# Patient Record
Sex: Female | Born: 1935
Health system: Southern US, Community
[De-identification: ages and names within clinical notes are randomized; demographics above are authoritative.]

## PROBLEM LIST (undated history)

## (undated) DIAGNOSIS — I10 Essential (primary) hypertension: Secondary | ICD-10-CM

## (undated) DIAGNOSIS — J4489 Other specified chronic obstructive pulmonary disease: Secondary | ICD-10-CM

## (undated) DIAGNOSIS — I2699 Other pulmonary embolism without acute cor pulmonale: Secondary | ICD-10-CM

## (undated) DIAGNOSIS — F3289 Other specified depressive episodes: Secondary | ICD-10-CM

## (undated) DIAGNOSIS — K648 Other hemorrhoids: Secondary | ICD-10-CM

## (undated) DIAGNOSIS — I2789 Other specified pulmonary heart diseases: Secondary | ICD-10-CM

## (undated) DIAGNOSIS — R0600 Dyspnea, unspecified: Secondary | ICD-10-CM

## (undated) DIAGNOSIS — E785 Hyperlipidemia, unspecified: Secondary | ICD-10-CM

## (undated) DIAGNOSIS — J45909 Unspecified asthma, uncomplicated: Secondary | ICD-10-CM

## (undated) DIAGNOSIS — M810 Age-related osteoporosis without current pathological fracture: Secondary | ICD-10-CM

## (undated) DIAGNOSIS — J449 Chronic obstructive pulmonary disease, unspecified: Secondary | ICD-10-CM

## (undated) DIAGNOSIS — K863 Pseudocyst of pancreas: Secondary | ICD-10-CM

## (undated) DIAGNOSIS — H9190 Unspecified hearing loss, unspecified ear: Secondary | ICD-10-CM

## (undated) DIAGNOSIS — J309 Allergic rhinitis, unspecified: Secondary | ICD-10-CM

## (undated) DIAGNOSIS — N318 Other neuromuscular dysfunction of bladder: Secondary | ICD-10-CM

## (undated) DIAGNOSIS — K219 Gastro-esophageal reflux disease without esophagitis: Secondary | ICD-10-CM

## (undated) DIAGNOSIS — J310 Chronic rhinitis: Secondary | ICD-10-CM

## (undated) DIAGNOSIS — R42 Dizziness and giddiness: Secondary | ICD-10-CM

## (undated) DIAGNOSIS — F411 Generalized anxiety disorder: Secondary | ICD-10-CM

## (undated) DIAGNOSIS — K222 Esophageal obstruction: Secondary | ICD-10-CM

## (undated) DIAGNOSIS — K5730 Diverticulosis of large intestine without perforation or abscess without bleeding: Secondary | ICD-10-CM

## (undated) DIAGNOSIS — I6529 Occlusion and stenosis of unspecified carotid artery: Secondary | ICD-10-CM

## (undated) DIAGNOSIS — D126 Benign neoplasm of colon, unspecified: Secondary | ICD-10-CM

## (undated) DIAGNOSIS — G43009 Migraine without aura, not intractable, without status migrainosus: Secondary | ICD-10-CM

## (undated) DIAGNOSIS — I2510 Atherosclerotic heart disease of native coronary artery without angina pectoris: Secondary | ICD-10-CM

## (undated) DIAGNOSIS — K862 Cyst of pancreas: Secondary | ICD-10-CM

## (undated) DIAGNOSIS — K5904 Chronic idiopathic constipation: Secondary | ICD-10-CM

## (undated) DIAGNOSIS — F329 Major depressive disorder, single episode, unspecified: Secondary | ICD-10-CM

## (undated) DIAGNOSIS — I639 Cerebral infarction, unspecified: Secondary | ICD-10-CM

## (undated) HISTORY — DX: Other hemorrhoids: K64.8

## (undated) HISTORY — DX: Dizziness and giddiness: R42

## (undated) HISTORY — DX: Cyst of pancreas: K86.2

## (undated) HISTORY — DX: Pseudocyst of pancreas: K86.3

## (undated) HISTORY — DX: Chronic idiopathic constipation: K59.04

## (undated) HISTORY — DX: Essential (primary) hypertension: I10

## (undated) HISTORY — DX: Other specified depressive episodes: F32.89

## (undated) HISTORY — DX: Chronic obstructive pulmonary disease, unspecified: J44.9

## (undated) HISTORY — DX: Other specified chronic obstructive pulmonary disease: J44.89

## (undated) HISTORY — DX: Esophageal obstruction: K22.2

## (undated) HISTORY — DX: Other specified pulmonary heart diseases: I27.89

## (undated) HISTORY — DX: Migraine without aura, not intractable, without status migrainosus: G43.009

## (undated) HISTORY — DX: Gastro-esophageal reflux disease without esophagitis: K21.9

## (undated) HISTORY — DX: Age-related osteoporosis without current pathological fracture: M81.0

## (undated) HISTORY — PX: ROOT CANAL: SHX2363

## (undated) HISTORY — PX: TONSILLECTOMY: SUR1361

## (undated) HISTORY — PX: PANCREATIC CYST DRAINAGE: SHX2156

## (undated) HISTORY — DX: Other neuromuscular dysfunction of bladder: N31.8

## (undated) HISTORY — DX: Allergic rhinitis, unspecified: J30.9

## (undated) HISTORY — DX: Chronic rhinitis: J31.0

## (undated) HISTORY — DX: Occlusion and stenosis of unspecified carotid artery: I65.29

## (undated) HISTORY — PX: OTHER SURGICAL HISTORY: SHX169

## (undated) HISTORY — DX: Other pulmonary embolism without acute cor pulmonale: I26.99

## (undated) HISTORY — DX: Atherosclerotic heart disease of native coronary artery without angina pectoris: I25.10

## (undated) HISTORY — DX: Unspecified hearing loss, unspecified ear: H91.90

## (undated) HISTORY — DX: Hyperlipidemia, unspecified: E78.5

## (undated) HISTORY — DX: Diverticulosis of large intestine without perforation or abscess without bleeding: K57.30

## (undated) HISTORY — DX: Benign neoplasm of colon, unspecified: D12.6

## (undated) HISTORY — DX: Generalized anxiety disorder: F41.1

## (undated) HISTORY — DX: Major depressive disorder, single episode, unspecified: F32.9

---

## 1961-10-08 HISTORY — PX: APPENDECTOMY: SHX54

## 1961-10-08 HISTORY — PX: CHOLECYSTECTOMY: SHX55

## 1963-10-09 HISTORY — PX: ABDOMINAL HYSTERECTOMY: SHX81

## 1995-08-30 ENCOUNTER — Encounter: Payer: Self-pay | Admitting: Gastroenterology

## 1997-07-28 ENCOUNTER — Encounter: Payer: Self-pay | Admitting: Gastroenterology

## 1997-10-08 HISTORY — PX: OTHER SURGICAL HISTORY: SHX169

## 1997-12-17 ENCOUNTER — Ambulatory Visit (HOSPITAL_COMMUNITY): Admission: RE | Admit: 1997-12-17 | Discharge: 1997-12-17 | Payer: Self-pay | Admitting: Infectious Diseases

## 1998-04-07 ENCOUNTER — Encounter: Payer: Self-pay | Admitting: Gastroenterology

## 1999-05-28 ENCOUNTER — Emergency Department (HOSPITAL_COMMUNITY): Admission: EM | Admit: 1999-05-28 | Discharge: 1999-05-28 | Payer: Self-pay | Admitting: Emergency Medicine

## 1999-05-28 ENCOUNTER — Encounter: Payer: Self-pay | Admitting: Emergency Medicine

## 1999-05-30 ENCOUNTER — Ambulatory Visit: Admission: RE | Admit: 1999-05-30 | Discharge: 1999-05-30 | Payer: Self-pay | Admitting: Internal Medicine

## 1999-09-05 ENCOUNTER — Encounter: Payer: Self-pay | Admitting: Internal Medicine

## 1999-09-05 ENCOUNTER — Ambulatory Visit (HOSPITAL_COMMUNITY): Admission: RE | Admit: 1999-09-05 | Discharge: 1999-09-05 | Payer: Self-pay | Admitting: Internal Medicine

## 1999-12-13 ENCOUNTER — Encounter: Payer: Self-pay | Admitting: Gastroenterology

## 2000-04-29 ENCOUNTER — Other Ambulatory Visit: Admission: RE | Admit: 2000-04-29 | Discharge: 2000-04-29 | Payer: Self-pay | Admitting: Obstetrics and Gynecology

## 2000-06-17 ENCOUNTER — Encounter: Payer: Self-pay | Admitting: Gastroenterology

## 2000-06-18 ENCOUNTER — Encounter (INDEPENDENT_AMBULATORY_CARE_PROVIDER_SITE_OTHER): Payer: Self-pay | Admitting: Specialist

## 2000-06-18 ENCOUNTER — Other Ambulatory Visit: Admission: RE | Admit: 2000-06-18 | Discharge: 2000-06-18 | Payer: Self-pay | Admitting: Gastroenterology

## 2000-06-24 ENCOUNTER — Encounter: Payer: Self-pay | Admitting: Gastroenterology

## 2000-06-24 ENCOUNTER — Ambulatory Visit (HOSPITAL_COMMUNITY): Admission: RE | Admit: 2000-06-24 | Discharge: 2000-06-24 | Payer: Self-pay | Admitting: Gastroenterology

## 2001-01-21 ENCOUNTER — Ambulatory Visit (HOSPITAL_COMMUNITY): Admission: RE | Admit: 2001-01-21 | Discharge: 2001-01-21 | Payer: Self-pay | Admitting: Internal Medicine

## 2001-03-31 ENCOUNTER — Emergency Department (HOSPITAL_COMMUNITY): Admission: EM | Admit: 2001-03-31 | Discharge: 2001-03-31 | Payer: Self-pay | Admitting: Internal Medicine

## 2001-04-06 ENCOUNTER — Emergency Department (HOSPITAL_COMMUNITY): Admission: EM | Admit: 2001-04-06 | Discharge: 2001-04-06 | Payer: Self-pay | Admitting: Emergency Medicine

## 2001-11-20 ENCOUNTER — Encounter: Payer: Self-pay | Admitting: Orthopedic Surgery

## 2001-11-20 ENCOUNTER — Encounter: Admission: RE | Admit: 2001-11-20 | Discharge: 2001-11-20 | Payer: Self-pay | Admitting: Orthopedic Surgery

## 2002-11-20 ENCOUNTER — Encounter: Payer: Self-pay | Admitting: Internal Medicine

## 2002-11-20 ENCOUNTER — Encounter: Admission: RE | Admit: 2002-11-20 | Discharge: 2002-11-20 | Payer: Self-pay | Admitting: Internal Medicine

## 2002-12-06 ENCOUNTER — Ambulatory Visit (HOSPITAL_COMMUNITY): Admission: RE | Admit: 2002-12-06 | Discharge: 2002-12-06 | Payer: Self-pay | Admitting: Gastroenterology

## 2002-12-06 ENCOUNTER — Encounter: Payer: Self-pay | Admitting: Gastroenterology

## 2003-01-26 ENCOUNTER — Encounter: Payer: Self-pay | Admitting: Gastroenterology

## 2003-01-26 ENCOUNTER — Ambulatory Visit (HOSPITAL_COMMUNITY): Admission: RE | Admit: 2003-01-26 | Discharge: 2003-01-26 | Payer: Self-pay | Admitting: Gastroenterology

## 2003-03-04 ENCOUNTER — Encounter: Payer: Self-pay | Admitting: Gastroenterology

## 2003-03-22 ENCOUNTER — Encounter: Payer: Self-pay | Admitting: Gastroenterology

## 2003-04-02 ENCOUNTER — Encounter: Payer: Self-pay | Admitting: Orthopedic Surgery

## 2003-04-02 ENCOUNTER — Encounter: Admission: RE | Admit: 2003-04-02 | Discharge: 2003-04-02 | Payer: Self-pay | Admitting: Orthopedic Surgery

## 2003-04-05 ENCOUNTER — Ambulatory Visit (HOSPITAL_BASED_OUTPATIENT_CLINIC_OR_DEPARTMENT_OTHER): Admission: RE | Admit: 2003-04-05 | Discharge: 2003-04-05 | Payer: Self-pay | Admitting: *Deleted

## 2003-05-21 ENCOUNTER — Ambulatory Visit (HOSPITAL_COMMUNITY): Admission: RE | Admit: 2003-05-21 | Discharge: 2003-05-21 | Payer: Self-pay | Admitting: Internal Medicine

## 2003-05-21 ENCOUNTER — Encounter: Payer: Self-pay | Admitting: Internal Medicine

## 2003-06-06 ENCOUNTER — Encounter: Payer: Self-pay | Admitting: Gastroenterology

## 2003-06-06 ENCOUNTER — Ambulatory Visit (HOSPITAL_COMMUNITY): Admission: RE | Admit: 2003-06-06 | Discharge: 2003-06-06 | Payer: Self-pay | Admitting: Gastroenterology

## 2003-07-24 ENCOUNTER — Emergency Department (HOSPITAL_COMMUNITY): Admission: EM | Admit: 2003-07-24 | Discharge: 2003-07-24 | Payer: Self-pay | Admitting: Emergency Medicine

## 2003-07-27 ENCOUNTER — Ambulatory Visit (HOSPITAL_COMMUNITY): Admission: RE | Admit: 2003-07-27 | Discharge: 2003-07-27 | Payer: Self-pay | Admitting: Internal Medicine

## 2003-07-27 ENCOUNTER — Encounter: Payer: Self-pay | Admitting: Internal Medicine

## 2003-08-29 ENCOUNTER — Ambulatory Visit (HOSPITAL_COMMUNITY): Admission: RE | Admit: 2003-08-29 | Discharge: 2003-08-29 | Payer: Self-pay | Admitting: Gastroenterology

## 2003-08-29 ENCOUNTER — Encounter: Payer: Self-pay | Admitting: Gastroenterology

## 2003-12-24 ENCOUNTER — Encounter: Admission: RE | Admit: 2003-12-24 | Discharge: 2003-12-24 | Payer: Self-pay | Admitting: Internal Medicine

## 2004-01-11 ENCOUNTER — Other Ambulatory Visit: Admission: RE | Admit: 2004-01-11 | Discharge: 2004-01-11 | Payer: Self-pay | Admitting: Obstetrics and Gynecology

## 2004-01-13 ENCOUNTER — Encounter: Payer: Self-pay | Admitting: Gastroenterology

## 2004-01-13 ENCOUNTER — Encounter: Payer: Self-pay | Admitting: Internal Medicine

## 2004-01-18 ENCOUNTER — Emergency Department (HOSPITAL_COMMUNITY): Admission: EM | Admit: 2004-01-18 | Discharge: 2004-01-18 | Payer: Self-pay | Admitting: Emergency Medicine

## 2004-01-22 ENCOUNTER — Inpatient Hospital Stay (HOSPITAL_COMMUNITY): Admission: EM | Admit: 2004-01-22 | Discharge: 2004-01-27 | Payer: Self-pay | Admitting: Emergency Medicine

## 2004-03-12 ENCOUNTER — Ambulatory Visit (HOSPITAL_COMMUNITY): Admission: RE | Admit: 2004-03-12 | Discharge: 2004-03-12 | Payer: Self-pay | Admitting: Internal Medicine

## 2004-05-03 ENCOUNTER — Encounter (INDEPENDENT_AMBULATORY_CARE_PROVIDER_SITE_OTHER): Payer: Self-pay | Admitting: *Deleted

## 2004-05-22 ENCOUNTER — Encounter: Admission: RE | Admit: 2004-05-22 | Discharge: 2004-06-22 | Payer: Self-pay | Admitting: Neurology

## 2004-07-04 ENCOUNTER — Emergency Department (HOSPITAL_COMMUNITY): Admission: EM | Admit: 2004-07-04 | Discharge: 2004-07-04 | Payer: Self-pay | Admitting: Emergency Medicine

## 2004-08-15 ENCOUNTER — Ambulatory Visit: Payer: Self-pay | Admitting: Gastroenterology

## 2004-09-19 ENCOUNTER — Ambulatory Visit: Payer: Self-pay | Admitting: Internal Medicine

## 2004-09-19 ENCOUNTER — Ambulatory Visit (HOSPITAL_COMMUNITY): Admission: RE | Admit: 2004-09-19 | Discharge: 2004-09-19 | Payer: Self-pay | Admitting: Internal Medicine

## 2004-10-12 ENCOUNTER — Ambulatory Visit: Payer: Self-pay | Admitting: Internal Medicine

## 2004-10-18 ENCOUNTER — Ambulatory Visit: Payer: Self-pay

## 2005-01-24 ENCOUNTER — Ambulatory Visit: Payer: Self-pay | Admitting: Internal Medicine

## 2005-01-26 ENCOUNTER — Ambulatory Visit: Payer: Self-pay | Admitting: Internal Medicine

## 2005-02-08 ENCOUNTER — Ambulatory Visit: Payer: Self-pay | Admitting: Internal Medicine

## 2005-03-07 ENCOUNTER — Ambulatory Visit: Payer: Self-pay | Admitting: Internal Medicine

## 2005-05-16 ENCOUNTER — Ambulatory Visit: Payer: Self-pay | Admitting: Internal Medicine

## 2005-07-23 ENCOUNTER — Ambulatory Visit: Payer: Self-pay | Admitting: Internal Medicine

## 2005-08-06 ENCOUNTER — Ambulatory Visit: Payer: Self-pay | Admitting: Internal Medicine

## 2005-09-19 ENCOUNTER — Ambulatory Visit: Payer: Self-pay | Admitting: Internal Medicine

## 2005-10-16 ENCOUNTER — Ambulatory Visit: Payer: Self-pay | Admitting: Internal Medicine

## 2005-12-06 ENCOUNTER — Emergency Department (HOSPITAL_COMMUNITY): Admission: EM | Admit: 2005-12-06 | Discharge: 2005-12-06 | Payer: Self-pay | Admitting: Emergency Medicine

## 2006-03-07 ENCOUNTER — Encounter: Admission: RE | Admit: 2006-03-07 | Discharge: 2006-03-07 | Payer: Self-pay | Admitting: Orthopedic Surgery

## 2006-03-11 ENCOUNTER — Ambulatory Visit: Payer: Self-pay | Admitting: Internal Medicine

## 2006-03-19 ENCOUNTER — Encounter: Admission: RE | Admit: 2006-03-19 | Discharge: 2006-03-19 | Payer: Self-pay | Admitting: Internal Medicine

## 2006-04-08 ENCOUNTER — Ambulatory Visit: Payer: Self-pay | Admitting: Internal Medicine

## 2006-04-18 ENCOUNTER — Encounter (INDEPENDENT_AMBULATORY_CARE_PROVIDER_SITE_OTHER): Payer: Self-pay | Admitting: *Deleted

## 2006-04-18 ENCOUNTER — Ambulatory Visit: Payer: Self-pay | Admitting: Gastroenterology

## 2006-05-13 ENCOUNTER — Ambulatory Visit (HOSPITAL_COMMUNITY): Admission: RE | Admit: 2006-05-13 | Discharge: 2006-05-13 | Payer: Self-pay | Admitting: Gastroenterology

## 2006-07-03 ENCOUNTER — Encounter: Admission: RE | Admit: 2006-07-03 | Discharge: 2006-07-03 | Payer: Self-pay | Admitting: Orthopedic Surgery

## 2006-07-23 ENCOUNTER — Ambulatory Visit: Payer: Self-pay | Admitting: Internal Medicine

## 2006-08-15 ENCOUNTER — Ambulatory Visit: Payer: Self-pay | Admitting: Internal Medicine

## 2006-08-23 ENCOUNTER — Ambulatory Visit: Payer: Self-pay | Admitting: Internal Medicine

## 2006-08-27 ENCOUNTER — Ambulatory Visit: Payer: Self-pay | Admitting: Internal Medicine

## 2006-09-06 ENCOUNTER — Encounter: Payer: Self-pay | Admitting: Gastroenterology

## 2006-09-10 ENCOUNTER — Encounter (INDEPENDENT_AMBULATORY_CARE_PROVIDER_SITE_OTHER): Payer: Self-pay | Admitting: *Deleted

## 2006-09-10 ENCOUNTER — Ambulatory Visit (HOSPITAL_COMMUNITY): Admission: RE | Admit: 2006-09-10 | Discharge: 2006-09-10 | Payer: Self-pay | Admitting: Gastroenterology

## 2006-10-08 DIAGNOSIS — I2699 Other pulmonary embolism without acute cor pulmonale: Secondary | ICD-10-CM

## 2006-10-08 HISTORY — DX: Other pulmonary embolism without acute cor pulmonale: I26.99

## 2006-11-04 ENCOUNTER — Ambulatory Visit: Payer: Self-pay | Admitting: Internal Medicine

## 2006-11-20 ENCOUNTER — Ambulatory Visit: Payer: Self-pay

## 2007-01-27 ENCOUNTER — Ambulatory Visit: Payer: Self-pay | Admitting: Internal Medicine

## 2007-01-27 LAB — CONVERTED CEMR LAB
Bacteria, UA: NEGATIVE
Bilirubin Urine: NEGATIVE
Crystals: NEGATIVE
Hemoglobin, Urine: NEGATIVE
Ketones, ur: NEGATIVE mg/dL
Leukocytes, UA: NEGATIVE
Mucus, UA: NEGATIVE
Nitrite: NEGATIVE
RBC / HPF: NONE SEEN
Specific Gravity, Urine: 1.025 (ref 1.000–1.03)
Total Protein, Urine: NEGATIVE mg/dL
Urine Glucose: NEGATIVE mg/dL
Urobilinogen, UA: 0.2 (ref 0.0–1.0)
WBC, UA: NONE SEEN cells/hpf
pH: 6 (ref 5.0–8.0)

## 2007-03-06 ENCOUNTER — Emergency Department (HOSPITAL_COMMUNITY): Admission: EM | Admit: 2007-03-06 | Discharge: 2007-03-06 | Payer: Self-pay | Admitting: Emergency Medicine

## 2007-03-11 DIAGNOSIS — M810 Age-related osteoporosis without current pathological fracture: Secondary | ICD-10-CM | POA: Insufficient documentation

## 2007-03-11 DIAGNOSIS — Z8679 Personal history of other diseases of the circulatory system: Secondary | ICD-10-CM | POA: Insufficient documentation

## 2007-03-11 DIAGNOSIS — K219 Gastro-esophageal reflux disease without esophagitis: Secondary | ICD-10-CM

## 2007-03-11 DIAGNOSIS — I10 Essential (primary) hypertension: Secondary | ICD-10-CM | POA: Insufficient documentation

## 2007-04-30 ENCOUNTER — Ambulatory Visit: Payer: Self-pay | Admitting: Internal Medicine

## 2007-04-30 LAB — CONVERTED CEMR LAB
Basophils Relative: 0.9 % (ref 0.0–1.0)
Eosinophils Absolute: 0.1 10*3/uL (ref 0.0–0.6)
Hemoglobin: 14.2 g/dL (ref 12.0–15.0)
Lymphocytes Relative: 31.1 % (ref 12.0–46.0)
MCV: 94.2 fL (ref 78.0–100.0)
Monocytes Absolute: 0.6 10*3/uL (ref 0.2–0.7)
Monocytes Relative: 8.3 % (ref 3.0–11.0)
Neutro Abs: 4.6 10*3/uL (ref 1.4–7.7)
Platelets: 294 10*3/uL (ref 150–400)

## 2007-06-28 ENCOUNTER — Inpatient Hospital Stay (HOSPITAL_COMMUNITY): Admission: EM | Admit: 2007-06-28 | Discharge: 2007-07-01 | Payer: Self-pay | Admitting: Emergency Medicine

## 2007-06-29 ENCOUNTER — Ambulatory Visit: Payer: Self-pay | Admitting: Internal Medicine

## 2007-06-30 ENCOUNTER — Ambulatory Visit: Payer: Self-pay | Admitting: Vascular Surgery

## 2007-06-30 ENCOUNTER — Encounter: Payer: Self-pay | Admitting: Internal Medicine

## 2007-07-03 ENCOUNTER — Ambulatory Visit: Payer: Self-pay | Admitting: Cardiology

## 2007-07-10 ENCOUNTER — Ambulatory Visit: Payer: Self-pay | Admitting: Internal Medicine

## 2007-07-10 ENCOUNTER — Ambulatory Visit: Payer: Self-pay | Admitting: Cardiology

## 2007-07-17 ENCOUNTER — Ambulatory Visit: Payer: Self-pay | Admitting: Cardiology

## 2007-07-28 ENCOUNTER — Ambulatory Visit: Payer: Self-pay | Admitting: Internal Medicine

## 2007-08-04 ENCOUNTER — Ambulatory Visit: Payer: Self-pay | Admitting: Internal Medicine

## 2007-08-05 DIAGNOSIS — G43009 Migraine without aura, not intractable, without status migrainosus: Secondary | ICD-10-CM | POA: Insufficient documentation

## 2007-08-05 DIAGNOSIS — K862 Cyst of pancreas: Secondary | ICD-10-CM | POA: Insufficient documentation

## 2007-08-05 DIAGNOSIS — J309 Allergic rhinitis, unspecified: Secondary | ICD-10-CM | POA: Insufficient documentation

## 2007-08-05 DIAGNOSIS — F411 Generalized anxiety disorder: Secondary | ICD-10-CM

## 2007-08-05 DIAGNOSIS — Z8719 Personal history of other diseases of the digestive system: Secondary | ICD-10-CM | POA: Insufficient documentation

## 2007-08-05 DIAGNOSIS — E785 Hyperlipidemia, unspecified: Secondary | ICD-10-CM | POA: Insufficient documentation

## 2007-08-05 DIAGNOSIS — K863 Pseudocyst of pancreas: Secondary | ICD-10-CM

## 2007-08-05 DIAGNOSIS — Z86718 Personal history of other venous thrombosis and embolism: Secondary | ICD-10-CM | POA: Insufficient documentation

## 2007-08-05 DIAGNOSIS — M1710 Unilateral primary osteoarthritis, unspecified knee: Secondary | ICD-10-CM | POA: Insufficient documentation

## 2007-08-05 DIAGNOSIS — I6529 Occlusion and stenosis of unspecified carotid artery: Secondary | ICD-10-CM

## 2007-08-05 DIAGNOSIS — K5730 Diverticulosis of large intestine without perforation or abscess without bleeding: Secondary | ICD-10-CM | POA: Insufficient documentation

## 2007-08-05 DIAGNOSIS — F329 Major depressive disorder, single episode, unspecified: Secondary | ICD-10-CM

## 2007-08-11 ENCOUNTER — Ambulatory Visit: Payer: Self-pay | Admitting: Cardiovascular Disease

## 2007-09-01 ENCOUNTER — Ambulatory Visit: Payer: Self-pay | Admitting: Cardiology

## 2007-09-02 ENCOUNTER — Telehealth (INDEPENDENT_AMBULATORY_CARE_PROVIDER_SITE_OTHER): Payer: Self-pay | Admitting: *Deleted

## 2007-09-15 ENCOUNTER — Encounter: Payer: Self-pay | Admitting: Internal Medicine

## 2007-09-16 ENCOUNTER — Encounter: Payer: Self-pay | Admitting: Internal Medicine

## 2007-09-16 ENCOUNTER — Telehealth: Payer: Self-pay | Admitting: Internal Medicine

## 2007-09-17 ENCOUNTER — Ambulatory Visit: Payer: Self-pay | Admitting: Cardiovascular Disease

## 2007-09-18 ENCOUNTER — Encounter: Payer: Self-pay | Admitting: Internal Medicine

## 2007-09-18 ENCOUNTER — Ambulatory Visit: Payer: Self-pay | Admitting: Internal Medicine

## 2007-10-07 ENCOUNTER — Telehealth: Payer: Self-pay | Admitting: Internal Medicine

## 2007-10-15 ENCOUNTER — Ambulatory Visit: Payer: Self-pay | Admitting: Cardiovascular Disease

## 2007-10-17 ENCOUNTER — Ambulatory Visit: Payer: Self-pay | Admitting: Internal Medicine

## 2007-10-17 DIAGNOSIS — J019 Acute sinusitis, unspecified: Secondary | ICD-10-CM

## 2007-10-17 DIAGNOSIS — H9190 Unspecified hearing loss, unspecified ear: Secondary | ICD-10-CM | POA: Insufficient documentation

## 2007-10-30 ENCOUNTER — Telehealth: Payer: Self-pay | Admitting: Internal Medicine

## 2007-10-30 ENCOUNTER — Encounter: Payer: Self-pay | Admitting: Internal Medicine

## 2007-11-12 ENCOUNTER — Ambulatory Visit: Payer: Self-pay | Admitting: Internal Medicine

## 2007-11-26 ENCOUNTER — Ambulatory Visit: Payer: Self-pay | Admitting: Internal Medicine

## 2007-11-26 DIAGNOSIS — R519 Headache, unspecified: Secondary | ICD-10-CM | POA: Insufficient documentation

## 2007-11-26 DIAGNOSIS — R269 Unspecified abnormalities of gait and mobility: Secondary | ICD-10-CM

## 2007-11-26 DIAGNOSIS — R51 Headache: Secondary | ICD-10-CM

## 2007-11-28 ENCOUNTER — Encounter (INDEPENDENT_AMBULATORY_CARE_PROVIDER_SITE_OTHER): Payer: Self-pay | Admitting: *Deleted

## 2007-12-01 ENCOUNTER — Encounter: Admission: RE | Admit: 2007-12-01 | Discharge: 2007-12-01 | Payer: Self-pay | Admitting: Internal Medicine

## 2007-12-11 ENCOUNTER — Ambulatory Visit: Payer: Self-pay | Admitting: Cardiovascular Disease

## 2007-12-15 ENCOUNTER — Encounter: Payer: Self-pay | Admitting: Internal Medicine

## 2007-12-23 ENCOUNTER — Telehealth: Payer: Self-pay | Admitting: Internal Medicine

## 2007-12-29 ENCOUNTER — Ambulatory Visit: Payer: Self-pay | Admitting: Cardiology

## 2007-12-30 ENCOUNTER — Ambulatory Visit: Payer: Self-pay | Admitting: Internal Medicine

## 2007-12-30 DIAGNOSIS — R0600 Dyspnea, unspecified: Secondary | ICD-10-CM | POA: Insufficient documentation

## 2007-12-31 ENCOUNTER — Ambulatory Visit: Payer: Self-pay | Admitting: Cardiology

## 2007-12-31 ENCOUNTER — Ambulatory Visit: Payer: Self-pay

## 2007-12-31 ENCOUNTER — Encounter: Payer: Self-pay | Admitting: Internal Medicine

## 2008-01-09 ENCOUNTER — Ambulatory Visit: Payer: Self-pay | Admitting: Internal Medicine

## 2008-01-15 ENCOUNTER — Ambulatory Visit: Payer: Self-pay | Admitting: Emergency Medicine

## 2008-01-15 DIAGNOSIS — I2789 Other specified pulmonary heart diseases: Secondary | ICD-10-CM | POA: Insufficient documentation

## 2008-01-15 LAB — CONVERTED CEMR LAB
Rhuematoid fact SerPl-aCnc: <20 intl units/mL — ABNORMAL LOW (ref 0.0–20.0)
Sed Rate: 47 mm/hr — ABNORMAL HIGH (ref 0–22)

## 2008-01-23 ENCOUNTER — Ambulatory Visit (HOSPITAL_COMMUNITY): Admission: RE | Admit: 2008-01-23 | Discharge: 2008-01-23 | Payer: Self-pay | Admitting: Emergency Medicine

## 2008-01-26 ENCOUNTER — Ambulatory Visit: Payer: Self-pay | Admitting: Cardiology

## 2008-01-30 ENCOUNTER — Ambulatory Visit: Payer: Self-pay | Admitting: Internal Medicine

## 2008-01-30 ENCOUNTER — Ambulatory Visit: Payer: Self-pay | Admitting: Emergency Medicine

## 2008-02-06 ENCOUNTER — Telehealth: Payer: Self-pay | Admitting: Emergency Medicine

## 2008-02-11 ENCOUNTER — Ambulatory Visit: Payer: Self-pay | Admitting: Internal Medicine

## 2008-02-19 ENCOUNTER — Encounter: Payer: Self-pay | Admitting: Emergency Medicine

## 2008-02-23 ENCOUNTER — Encounter: Payer: Self-pay | Admitting: Internal Medicine

## 2008-03-05 ENCOUNTER — Ambulatory Visit: Payer: Self-pay | Admitting: Cardiology

## 2008-03-10 ENCOUNTER — Telehealth (INDEPENDENT_AMBULATORY_CARE_PROVIDER_SITE_OTHER): Payer: Self-pay | Admitting: *Deleted

## 2008-03-11 ENCOUNTER — Encounter: Payer: Self-pay | Admitting: Internal Medicine

## 2008-03-15 ENCOUNTER — Telehealth (INDEPENDENT_AMBULATORY_CARE_PROVIDER_SITE_OTHER): Payer: Self-pay | Admitting: *Deleted

## 2008-03-29 ENCOUNTER — Encounter: Payer: Self-pay | Admitting: Emergency Medicine

## 2008-03-31 ENCOUNTER — Ambulatory Visit: Payer: Self-pay | Admitting: Emergency Medicine

## 2008-03-31 DIAGNOSIS — J441 Chronic obstructive pulmonary disease with (acute) exacerbation: Secondary | ICD-10-CM

## 2008-04-02 ENCOUNTER — Ambulatory Visit: Payer: Self-pay | Admitting: Cardiology

## 2008-05-06 ENCOUNTER — Telehealth (INDEPENDENT_AMBULATORY_CARE_PROVIDER_SITE_OTHER): Payer: Self-pay | Admitting: *Deleted

## 2008-05-07 ENCOUNTER — Ambulatory Visit: Payer: Self-pay | Admitting: Internal Medicine

## 2008-05-07 DIAGNOSIS — K921 Melena: Secondary | ICD-10-CM

## 2008-05-07 DIAGNOSIS — B9789 Other viral agents as the cause of diseases classified elsewhere: Secondary | ICD-10-CM | POA: Insufficient documentation

## 2008-05-17 ENCOUNTER — Ambulatory Visit: Payer: Self-pay | Admitting: Cardiology

## 2008-05-20 ENCOUNTER — Encounter: Payer: Self-pay | Admitting: Internal Medicine

## 2008-05-27 ENCOUNTER — Ambulatory Visit: Payer: Self-pay | Admitting: Internal Medicine

## 2008-05-27 DIAGNOSIS — R300 Dysuria: Secondary | ICD-10-CM

## 2008-05-27 LAB — CONVERTED CEMR LAB
Bilirubin Urine: NEGATIVE
Mucus, UA: NEGATIVE
Nitrite: NEGATIVE
Total Protein, Urine: NEGATIVE mg/dL
Urine Glucose: NEGATIVE mg/dL
pH: 5 (ref 5.0–8.0)

## 2008-05-31 ENCOUNTER — Telehealth (INDEPENDENT_AMBULATORY_CARE_PROVIDER_SITE_OTHER): Payer: Self-pay | Admitting: *Deleted

## 2008-06-07 ENCOUNTER — Ambulatory Visit: Payer: Self-pay | Admitting: Cardiology

## 2008-06-07 LAB — CONVERTED CEMR LAB
Basophils Relative: 0.6 % (ref 0.0–3.0)
Eosinophils Relative: 0.9 % (ref 0.0–5.0)
HCT: 40.4 % (ref 36.0–46.0)
Hemoglobin: 14.2 g/dL (ref 12.0–15.0)
MCV: 97.1 fL (ref 78.0–100.0)
Monocytes Absolute: 0.7 10*3/uL (ref 0.1–1.0)
Monocytes Relative: 9.8 % (ref 3.0–12.0)
Neutro Abs: 4.6 10*3/uL (ref 1.4–7.7)
WBC: 7.4 10*3/uL (ref 4.5–10.5)

## 2008-06-11 ENCOUNTER — Ambulatory Visit: Payer: Self-pay | Admitting: Emergency Medicine

## 2008-06-28 ENCOUNTER — Ambulatory Visit: Payer: Self-pay | Admitting: Cardiovascular Disease

## 2008-06-29 ENCOUNTER — Telehealth (INDEPENDENT_AMBULATORY_CARE_PROVIDER_SITE_OTHER): Payer: Self-pay | Admitting: *Deleted

## 2008-07-05 ENCOUNTER — Ambulatory Visit: Payer: Self-pay | Admitting: Internal Medicine

## 2008-07-07 ENCOUNTER — Ambulatory Visit: Payer: Self-pay | Admitting: Cardiology

## 2008-07-08 ENCOUNTER — Encounter: Payer: Self-pay | Admitting: Emergency Medicine

## 2008-07-08 ENCOUNTER — Ambulatory Visit: Payer: Self-pay

## 2008-07-12 ENCOUNTER — Ambulatory Visit (HOSPITAL_COMMUNITY): Admission: RE | Admit: 2008-07-12 | Discharge: 2008-07-12 | Payer: Self-pay | Admitting: Emergency Medicine

## 2008-07-15 ENCOUNTER — Ambulatory Visit: Payer: Self-pay | Admitting: Internal Medicine

## 2008-07-15 DIAGNOSIS — R5383 Other fatigue: Secondary | ICD-10-CM

## 2008-07-15 DIAGNOSIS — J069 Acute upper respiratory infection, unspecified: Secondary | ICD-10-CM | POA: Insufficient documentation

## 2008-07-15 DIAGNOSIS — R5381 Other malaise: Secondary | ICD-10-CM | POA: Insufficient documentation

## 2008-07-16 LAB — CONVERTED CEMR LAB
AST: 34 units/L (ref 0–37)
Albumin: 3.7 g/dL (ref 3.5–5.2)
Alkaline Phosphatase: 76 units/L (ref 39–117)
BUN: 16 mg/dL (ref 6–23)
Basophils Relative: 0.4 % (ref 0.0–3.0)
Eosinophils Relative: 1.5 % (ref 0.0–5.0)
GFR calc Af Amer: 105 mL/min
Glucose, Bld: 96 mg/dL (ref 70–99)
HCT: 42.4 % (ref 36.0–46.0)
Hemoglobin: 14.6 g/dL (ref 12.0–15.0)
Monocytes Absolute: 0.7 10*3/uL (ref 0.1–1.0)
Monocytes Relative: 9.1 % (ref 3.0–12.0)
Neutro Abs: 4.3 10*3/uL (ref 1.4–7.7)
Platelets: 247 10*3/uL (ref 150–400)
Potassium: 4.2 meq/L (ref 3.5–5.1)
RBC: 4.5 M/uL (ref 3.87–5.11)
Total CHOL/HDL Ratio: 3.8
Total Protein: 7.2 g/dL (ref 6.0–8.3)
WBC: 7.6 10*3/uL (ref 4.5–10.5)

## 2008-07-21 ENCOUNTER — Ambulatory Visit: Payer: Self-pay | Admitting: Emergency Medicine

## 2008-07-22 ENCOUNTER — Encounter: Admission: RE | Admit: 2008-07-22 | Discharge: 2008-07-22 | Payer: Self-pay | Admitting: Emergency Medicine

## 2008-07-28 ENCOUNTER — Telehealth: Payer: Self-pay | Admitting: Emergency Medicine

## 2008-07-28 ENCOUNTER — Ambulatory Visit: Payer: Self-pay | Admitting: Cardiology

## 2008-08-04 ENCOUNTER — Encounter: Payer: Self-pay | Admitting: Internal Medicine

## 2008-08-04 ENCOUNTER — Ambulatory Visit: Payer: Self-pay | Admitting: Cardiology

## 2008-08-05 ENCOUNTER — Ambulatory Visit: Payer: Self-pay | Admitting: Cardiovascular Disease

## 2008-08-06 ENCOUNTER — Ambulatory Visit: Payer: Self-pay | Admitting: Internal Medicine

## 2008-08-09 ENCOUNTER — Ambulatory Visit: Payer: Self-pay | Admitting: Cardiovascular Disease

## 2008-08-23 ENCOUNTER — Ambulatory Visit: Payer: Self-pay | Admitting: Cardiology

## 2008-08-26 ENCOUNTER — Encounter: Payer: Self-pay | Admitting: Internal Medicine

## 2008-09-13 ENCOUNTER — Ambulatory Visit: Payer: Self-pay | Admitting: Cardiovascular Disease

## 2008-09-17 ENCOUNTER — Ambulatory Visit: Payer: Self-pay | Admitting: Cardiovascular Disease

## 2008-10-21 ENCOUNTER — Ambulatory Visit: Payer: Self-pay | Admitting: Cardiovascular Disease

## 2008-10-22 ENCOUNTER — Ambulatory Visit: Payer: Self-pay | Admitting: Internal Medicine

## 2008-11-04 ENCOUNTER — Ambulatory Visit: Payer: Self-pay | Admitting: Internal Medicine

## 2008-11-18 ENCOUNTER — Ambulatory Visit: Payer: Self-pay | Admitting: Cardiovascular Disease

## 2008-11-22 ENCOUNTER — Ambulatory Visit: Payer: Self-pay | Admitting: Internal Medicine

## 2008-11-22 DIAGNOSIS — J310 Chronic rhinitis: Secondary | ICD-10-CM | POA: Insufficient documentation

## 2008-11-25 ENCOUNTER — Ambulatory Visit: Payer: Self-pay | Admitting: Internal Medicine

## 2008-11-26 ENCOUNTER — Encounter: Payer: Self-pay | Admitting: Internal Medicine

## 2008-11-26 ENCOUNTER — Encounter: Payer: Self-pay | Admitting: Endocrinology

## 2008-12-02 ENCOUNTER — Telehealth (INDEPENDENT_AMBULATORY_CARE_PROVIDER_SITE_OTHER): Payer: Self-pay | Admitting: *Deleted

## 2008-12-10 ENCOUNTER — Ambulatory Visit: Payer: Self-pay | Admitting: Internal Medicine

## 2008-12-29 ENCOUNTER — Ambulatory Visit: Payer: Self-pay | Admitting: Internal Medicine

## 2008-12-29 ENCOUNTER — Encounter: Payer: Self-pay | Admitting: Internal Medicine

## 2009-01-27 ENCOUNTER — Ambulatory Visit: Payer: Self-pay | Admitting: Internal Medicine

## 2009-01-28 ENCOUNTER — Encounter: Payer: Self-pay | Admitting: Emergency Medicine

## 2009-02-10 ENCOUNTER — Ambulatory Visit: Payer: Self-pay | Admitting: Internal Medicine

## 2009-03-01 ENCOUNTER — Ambulatory Visit: Payer: Self-pay | Admitting: Cardiology

## 2009-03-09 ENCOUNTER — Encounter: Payer: Self-pay | Admitting: *Deleted

## 2009-03-22 ENCOUNTER — Ambulatory Visit: Payer: Self-pay | Admitting: Internal Medicine

## 2009-03-22 LAB — CONVERTED CEMR LAB: Protime: 17.5

## 2009-03-23 ENCOUNTER — Telehealth: Payer: Self-pay | Admitting: Internal Medicine

## 2009-03-25 ENCOUNTER — Telehealth (INDEPENDENT_AMBULATORY_CARE_PROVIDER_SITE_OTHER): Payer: Self-pay | Admitting: *Deleted

## 2009-03-25 ENCOUNTER — Ambulatory Visit: Payer: Self-pay | Admitting: Internal Medicine

## 2009-03-25 DIAGNOSIS — L02419 Cutaneous abscess of limb, unspecified: Secondary | ICD-10-CM

## 2009-03-25 DIAGNOSIS — L03119 Cellulitis of unspecified part of limb: Secondary | ICD-10-CM

## 2009-03-25 DIAGNOSIS — R350 Frequency of micturition: Secondary | ICD-10-CM

## 2009-03-25 LAB — CONVERTED CEMR LAB
Ketones, ur: NEGATIVE mg/dL
Specific Gravity, Urine: <=1.005 (ref 1.000–1.030)
Urine Glucose: NEGATIVE mg/dL
pH: 5.5 (ref 5.0–8.0)

## 2009-03-26 ENCOUNTER — Encounter: Payer: Self-pay | Admitting: Internal Medicine

## 2009-04-06 ENCOUNTER — Ambulatory Visit: Payer: Self-pay | Admitting: Internal Medicine

## 2009-04-06 DIAGNOSIS — L989 Disorder of the skin and subcutaneous tissue, unspecified: Secondary | ICD-10-CM | POA: Insufficient documentation

## 2009-04-06 DIAGNOSIS — Z860100 Personal history of colon polyps, unspecified: Secondary | ICD-10-CM | POA: Insufficient documentation

## 2009-04-06 DIAGNOSIS — I2510 Atherosclerotic heart disease of native coronary artery without angina pectoris: Secondary | ICD-10-CM | POA: Insufficient documentation

## 2009-04-06 LAB — CONVERTED CEMR LAB
Albumin: 3.9 g/dL (ref 3.5–5.2)
Alkaline Phosphatase: 71 units/L (ref 39–117)
Basophils Relative: 1.1 % (ref 0.0–3.0)
CO2: 30 meq/L (ref 19–32)
Chloride: 106 meq/L (ref 96–112)
Cholesterol: 230 mg/dL — ABNORMAL HIGH (ref 0–200)
Direct LDL: 153.2 mg/dL
Eosinophils Absolute: 0.1 10*3/uL (ref 0.0–0.7)
Folate: 18.4 ng/mL
Hemoglobin: 15 g/dL (ref 12.0–15.0)
Ketones, ur: NEGATIVE mg/dL
Leukocytes, UA: NEGATIVE
MCHC: 34.5 g/dL (ref 30.0–36.0)
MCV: 94.4 fL (ref 78.0–100.0)
Monocytes Absolute: 0.5 10*3/uL (ref 0.1–1.0)
Neutro Abs: 3.6 10*3/uL (ref 1.4–7.7)
Nitrite: NEGATIVE
RBC: 4.61 M/uL (ref 3.87–5.11)
Sed Rate: 45 mm/hr — ABNORMAL HIGH (ref 0–22)
Sodium: 142 meq/L (ref 135–145)
Specific Gravity, Urine: >=1.03 (ref 1.000–1.030)
Total CHOL/HDL Ratio: 4
Total Protein: 7.8 g/dL (ref 6.0–8.3)
pH: 5 (ref 5.0–8.0)

## 2009-04-13 ENCOUNTER — Encounter: Payer: Self-pay | Admitting: *Deleted

## 2009-05-03 ENCOUNTER — Ambulatory Visit: Payer: Self-pay | Admitting: Internal Medicine

## 2009-05-03 LAB — CONVERTED CEMR LAB
POC INR: 1.8
Prothrombin Time: 16.5 s

## 2009-05-25 ENCOUNTER — Encounter: Payer: Self-pay | Admitting: Internal Medicine

## 2009-05-26 ENCOUNTER — Ambulatory Visit: Payer: Self-pay | Admitting: Internal Medicine

## 2009-05-26 DIAGNOSIS — N318 Other neuromuscular dysfunction of bladder: Secondary | ICD-10-CM | POA: Insufficient documentation

## 2009-05-31 ENCOUNTER — Ambulatory Visit: Payer: Self-pay | Admitting: Internal Medicine

## 2009-05-31 ENCOUNTER — Encounter (INDEPENDENT_AMBULATORY_CARE_PROVIDER_SITE_OTHER): Payer: Self-pay | Admitting: Cardiology

## 2009-06-02 ENCOUNTER — Encounter: Payer: Self-pay | Admitting: Emergency Medicine

## 2009-06-21 ENCOUNTER — Ambulatory Visit: Payer: Self-pay | Admitting: Cardiology

## 2009-07-21 ENCOUNTER — Ambulatory Visit: Payer: Self-pay | Admitting: Cardiology

## 2009-07-21 LAB — CONVERTED CEMR LAB: POC INR: 1.8

## 2009-08-02 ENCOUNTER — Telehealth: Payer: Self-pay | Admitting: Internal Medicine

## 2009-08-02 ENCOUNTER — Ambulatory Visit: Payer: Self-pay | Admitting: Internal Medicine

## 2009-08-05 ENCOUNTER — Encounter: Payer: Self-pay | Admitting: Internal Medicine

## 2009-08-11 ENCOUNTER — Telehealth (INDEPENDENT_AMBULATORY_CARE_PROVIDER_SITE_OTHER): Payer: Self-pay | Admitting: *Deleted

## 2009-08-15 ENCOUNTER — Ambulatory Visit: Payer: Self-pay | Admitting: Internal Medicine

## 2009-08-24 ENCOUNTER — Encounter: Payer: Self-pay | Admitting: Emergency Medicine

## 2009-08-24 ENCOUNTER — Ambulatory Visit: Payer: Self-pay

## 2009-08-24 ENCOUNTER — Ambulatory Visit (HOSPITAL_COMMUNITY): Admission: RE | Admit: 2009-08-24 | Discharge: 2009-08-24 | Payer: Self-pay | Admitting: Emergency Medicine

## 2009-08-24 ENCOUNTER — Ambulatory Visit: Payer: Self-pay | Admitting: Cardiology

## 2009-09-12 ENCOUNTER — Ambulatory Visit: Payer: Self-pay | Admitting: Cardiology

## 2009-09-14 ENCOUNTER — Ambulatory Visit: Payer: Self-pay | Admitting: Emergency Medicine

## 2009-10-10 ENCOUNTER — Ambulatory Visit: Payer: Self-pay | Admitting: Cardiology

## 2009-10-10 LAB — CONVERTED CEMR LAB: POC INR: 1.7

## 2009-10-14 ENCOUNTER — Ambulatory Visit: Payer: Self-pay | Admitting: Internal Medicine

## 2009-10-14 DIAGNOSIS — R42 Dizziness and giddiness: Secondary | ICD-10-CM

## 2009-10-14 LAB — CONVERTED CEMR LAB
Basophils Relative: 0.5 % (ref 0.0–3.0)
Eosinophils Relative: 0.6 % (ref 0.0–5.0)
HCT: 43.5 % (ref 36.0–46.0)
Hemoglobin: 14.2 g/dL (ref 12.0–15.0)
Lymphs Abs: 2.2 10*3/uL (ref 0.7–4.0)
MCV: 96.5 fL (ref 78.0–100.0)
Monocytes Absolute: 0.6 10*3/uL (ref 0.1–1.0)
Monocytes Relative: 7.7 % (ref 3.0–12.0)
Neutro Abs: 5 10*3/uL (ref 1.4–7.7)
RBC: 4.5 M/uL (ref 3.87–5.11)
WBC: 7.8 10*3/uL (ref 4.5–10.5)

## 2009-10-16 ENCOUNTER — Encounter: Payer: Self-pay | Admitting: Emergency Medicine

## 2009-10-31 ENCOUNTER — Ambulatory Visit: Payer: Self-pay | Admitting: Cardiology

## 2009-11-02 ENCOUNTER — Encounter: Payer: Self-pay | Admitting: Internal Medicine

## 2009-11-23 ENCOUNTER — Ambulatory Visit: Payer: Self-pay | Admitting: Internal Medicine

## 2009-11-23 LAB — CONVERTED CEMR LAB: POC INR: 2.4

## 2009-12-08 ENCOUNTER — Ambulatory Visit: Payer: Self-pay | Admitting: Internal Medicine

## 2009-12-08 LAB — CONVERTED CEMR LAB
Leukocytes, UA: NEGATIVE
Nitrite: NEGATIVE
Total Protein, Urine: NEGATIVE mg/dL
pH: 5 (ref 5.0–8.0)

## 2009-12-09 ENCOUNTER — Encounter: Payer: Self-pay | Admitting: Internal Medicine

## 2009-12-12 ENCOUNTER — Ambulatory Visit: Payer: Self-pay | Admitting: Internal Medicine

## 2009-12-12 LAB — CONVERTED CEMR LAB: POC INR: 2.7

## 2009-12-13 ENCOUNTER — Ambulatory Visit: Payer: Self-pay | Admitting: Internal Medicine

## 2009-12-13 ENCOUNTER — Encounter: Payer: Self-pay | Admitting: Internal Medicine

## 2010-01-11 ENCOUNTER — Ambulatory Visit: Payer: Self-pay | Admitting: Cardiology

## 2010-01-11 LAB — CONVERTED CEMR LAB: POC INR: 3.5

## 2010-01-13 ENCOUNTER — Ambulatory Visit: Payer: Self-pay | Admitting: Internal Medicine

## 2010-01-17 ENCOUNTER — Encounter: Payer: Self-pay | Admitting: Internal Medicine

## 2010-01-20 ENCOUNTER — Telehealth: Payer: Self-pay | Admitting: Internal Medicine

## 2010-02-01 ENCOUNTER — Ambulatory Visit: Payer: Self-pay | Admitting: Internal Medicine

## 2010-02-14 ENCOUNTER — Encounter: Payer: Self-pay | Admitting: Internal Medicine

## 2010-03-10 ENCOUNTER — Ambulatory Visit: Payer: Self-pay | Admitting: Cardiovascular Disease

## 2010-04-07 ENCOUNTER — Ambulatory Visit: Payer: Self-pay | Admitting: Cardiology

## 2010-04-07 LAB — CONVERTED CEMR LAB: POC INR: 2.7

## 2010-04-17 ENCOUNTER — Encounter: Payer: Self-pay | Admitting: Internal Medicine

## 2010-04-17 ENCOUNTER — Telehealth: Payer: Self-pay | Admitting: Internal Medicine

## 2010-04-19 ENCOUNTER — Telehealth: Payer: Self-pay | Admitting: Internal Medicine

## 2010-05-10 ENCOUNTER — Ambulatory Visit: Payer: Self-pay | Admitting: Cardiovascular Disease

## 2010-05-10 LAB — CONVERTED CEMR LAB: POC INR: 1.6

## 2010-05-18 ENCOUNTER — Ambulatory Visit: Payer: Self-pay | Admitting: Emergency Medicine

## 2010-05-24 ENCOUNTER — Ambulatory Visit: Payer: Self-pay | Admitting: Internal Medicine

## 2010-05-24 DIAGNOSIS — R1084 Generalized abdominal pain: Secondary | ICD-10-CM

## 2010-05-25 LAB — CONVERTED CEMR LAB
ALT: 27 units/L (ref 0–35)
Albumin: 3.8 g/dL (ref 3.5–5.2)
Amylase: 59 units/L (ref 27–131)
BUN: 23 mg/dL (ref 6–23)
Basophils Relative: 0.4 % (ref 0.0–3.0)
Bilirubin Urine: NEGATIVE
CO2: 29 meq/L (ref 19–32)
Chloride: 101 meq/L (ref 96–112)
Creatinine, Ser: 0.6 mg/dL (ref 0.4–1.2)
Eosinophils Absolute: 0 10*3/uL (ref 0.0–0.7)
Eosinophils Relative: 0.4 % (ref 0.0–5.0)
HCT: 41.8 % (ref 36.0–46.0)
Hemoglobin, Urine: NEGATIVE
INR: 2.4 — ABNORMAL HIGH (ref 0.8–1.0)
Leukocytes, UA: NEGATIVE
Lipase: 45 units/L (ref 11.0–59.0)
Lymphs Abs: 2.4 10*3/uL (ref 0.7–4.0)
MCHC: 33.9 g/dL (ref 30.0–36.0)
MCV: 94.3 fL (ref 78.0–100.0)
Monocytes Absolute: 0.8 10*3/uL (ref 0.1–1.0)
Neutrophils Relative %: 68.1 % (ref 43.0–77.0)
Nitrite: NEGATIVE
Potassium: 4.9 meq/L (ref 3.5–5.1)
RBC: 4.43 M/uL (ref 3.87–5.11)
Total Protein, Urine: NEGATIVE mg/dL
Total Protein: 7 g/dL (ref 6.0–8.3)
Urobilinogen, UA: 0.2 (ref 0.0–1.0)
WBC: 10.2 10*3/uL (ref 4.5–10.5)

## 2010-05-30 ENCOUNTER — Encounter: Payer: Self-pay | Admitting: Internal Medicine

## 2010-05-30 ENCOUNTER — Ambulatory Visit: Payer: Self-pay | Admitting: Cardiovascular Disease

## 2010-05-30 LAB — CONVERTED CEMR LAB: POC INR: 2.8

## 2010-06-02 ENCOUNTER — Telehealth: Payer: Self-pay | Admitting: Gastroenterology

## 2010-06-06 ENCOUNTER — Ambulatory Visit: Payer: Self-pay | Admitting: Gastroenterology

## 2010-06-06 DIAGNOSIS — R932 Abnormal findings on diagnostic imaging of liver and biliary tract: Secondary | ICD-10-CM

## 2010-06-06 DIAGNOSIS — R634 Abnormal weight loss: Secondary | ICD-10-CM | POA: Insufficient documentation

## 2010-06-06 DIAGNOSIS — R1033 Periumbilical pain: Secondary | ICD-10-CM | POA: Insufficient documentation

## 2010-06-06 DIAGNOSIS — R1013 Epigastric pain: Secondary | ICD-10-CM

## 2010-06-06 DIAGNOSIS — R110 Nausea: Secondary | ICD-10-CM | POA: Insufficient documentation

## 2010-06-08 ENCOUNTER — Ambulatory Visit (HOSPITAL_COMMUNITY): Admission: RE | Admit: 2010-06-08 | Discharge: 2010-06-08 | Payer: Self-pay | Admitting: Gastroenterology

## 2010-06-27 ENCOUNTER — Ambulatory Visit: Payer: Self-pay | Admitting: Cardiology

## 2010-06-27 LAB — CONVERTED CEMR LAB: POC INR: 3.9

## 2010-06-28 ENCOUNTER — Encounter: Payer: Self-pay | Admitting: Emergency Medicine

## 2010-07-03 ENCOUNTER — Ambulatory Visit: Payer: Self-pay | Admitting: Gastroenterology

## 2010-07-03 DIAGNOSIS — R142 Eructation: Secondary | ICD-10-CM

## 2010-07-03 DIAGNOSIS — R143 Flatulence: Secondary | ICD-10-CM

## 2010-07-03 DIAGNOSIS — R141 Gas pain: Secondary | ICD-10-CM | POA: Insufficient documentation

## 2010-07-03 DIAGNOSIS — R933 Abnormal findings on diagnostic imaging of other parts of digestive tract: Secondary | ICD-10-CM

## 2010-07-04 ENCOUNTER — Telehealth (INDEPENDENT_AMBULATORY_CARE_PROVIDER_SITE_OTHER): Payer: Self-pay | Admitting: *Deleted

## 2010-07-04 ENCOUNTER — Encounter (INDEPENDENT_AMBULATORY_CARE_PROVIDER_SITE_OTHER): Payer: Self-pay | Admitting: *Deleted

## 2010-07-06 ENCOUNTER — Telehealth (INDEPENDENT_AMBULATORY_CARE_PROVIDER_SITE_OTHER): Payer: Self-pay | Admitting: *Deleted

## 2010-07-20 ENCOUNTER — Ambulatory Visit (HOSPITAL_COMMUNITY): Admission: RE | Admit: 2010-07-20 | Discharge: 2010-07-20 | Payer: Self-pay | Admitting: Gastroenterology

## 2010-07-20 ENCOUNTER — Ambulatory Visit: Payer: Self-pay | Admitting: Gastroenterology

## 2010-07-21 ENCOUNTER — Encounter: Payer: Self-pay | Admitting: Gastroenterology

## 2010-07-25 ENCOUNTER — Ambulatory Visit: Payer: Self-pay | Admitting: Cardiology

## 2010-08-04 ENCOUNTER — Ambulatory Visit: Payer: Self-pay | Admitting: Cardiovascular Disease

## 2010-08-04 LAB — CONVERTED CEMR LAB: POC INR: 2.6

## 2010-08-25 ENCOUNTER — Ambulatory Visit: Payer: Self-pay | Admitting: Cardiology

## 2010-08-25 ENCOUNTER — Ambulatory Visit (HOSPITAL_COMMUNITY): Admission: RE | Admit: 2010-08-25 | Discharge: 2010-08-25 | Payer: Self-pay | Admitting: Emergency Medicine

## 2010-08-25 ENCOUNTER — Ambulatory Visit: Payer: Self-pay | Admitting: Cardiovascular Disease

## 2010-08-25 ENCOUNTER — Encounter: Payer: Self-pay | Admitting: Emergency Medicine

## 2010-08-25 ENCOUNTER — Ambulatory Visit: Payer: Self-pay

## 2010-08-25 LAB — CONVERTED CEMR LAB: POC INR: 2.9

## 2010-09-11 ENCOUNTER — Encounter: Payer: Self-pay | Admitting: Internal Medicine

## 2010-09-19 LAB — CONVERTED CEMR LAB: POC INR: 2

## 2010-09-22 ENCOUNTER — Ambulatory Visit: Payer: Self-pay | Admitting: Internal Medicine

## 2010-10-03 ENCOUNTER — Encounter: Payer: Self-pay | Admitting: Internal Medicine

## 2010-10-12 ENCOUNTER — Ambulatory Visit: Admission: RE | Admit: 2010-10-12 | Discharge: 2010-10-12 | Payer: Self-pay | Source: Home / Self Care

## 2010-10-12 LAB — CONVERTED CEMR LAB: POC INR: 3.1

## 2010-10-16 ENCOUNTER — Ambulatory Visit
Admission: RE | Admit: 2010-10-16 | Discharge: 2010-10-16 | Payer: Self-pay | Source: Home / Self Care | Attending: Emergency Medicine | Admitting: Emergency Medicine

## 2010-10-28 ENCOUNTER — Encounter: Payer: Self-pay | Admitting: Orthopedic Surgery

## 2010-10-29 ENCOUNTER — Encounter: Payer: Self-pay | Admitting: Internal Medicine

## 2010-10-29 ENCOUNTER — Encounter: Payer: Self-pay | Admitting: Gastroenterology

## 2010-10-29 ENCOUNTER — Encounter: Payer: Self-pay | Admitting: Orthopedic Surgery

## 2010-11-07 NOTE — Progress Notes (Signed)
Summary: EUS   Phone Note Outgoing Call Call back at [facility] Phone [phone]   Call placed by: Chales Abrahams CMA Duncan Dull),  July 04, 2010 8:47 AM Summary of Call: EUS scheduled and Dr Jonny Ruiz anti coag letter routed.  Need to review meds and instruct pt Initial call taken by: Chales Abrahams CMA Duncan Dull),  July 04, 2010 8:48 AM  Follow-up for Phone Call        line busy Chales Abrahams CMA Duncan Dull)  July 04, 2010 9:42 AM   pt aware of instructions meds reviewed pt advised that Dr Sherene Sires is following her coumadin so a letter is being sent to Dr Sherene Sires. Follow-up by: Chales Abrahams CMA Duncan Dull),  July 04, 2010 10:07 AM

## 2010-11-07 NOTE — Miscellaneous (Signed)
Summary: Orders Update   Clinical Lists Changes  Orders: Added new Referral order of Gastroenterology Referral (GI) - Signed 

## 2010-11-07 NOTE — Letter (Signed)
Summary: EGD Instructions  [facility]  [address] [place], [place] [address]   Phone: [phone]  Fax: [phone]       [name]    08-23-1930    MRN: [id number]       Procedure Day /Date:07/20/10  THURS     Arrival Time:945 am     Procedure Time:1045 am     Location of Procedure:                     X [facility] ( Outpatient Registration)    PREPARATION FOR ENDOSCOPY   On 07/20/10  THE DAY OF THE PROCEDURE:  1.   No solid foods, milk or milk products are allowed after midnight the night before your procedure.  2.   Do not drink anything colored red or purple.  Avoid juices with pulp.  No orange juice.  3.  You may drink clear liquids until 645 am , which is 4 hours before your procedure.                                                                                                CLEAR LIQUIDS INCLUDE: Water Jello Ice Popsicles Tea (sugar ok, no milk/cream) Powdered fruit flavored drinks Coffee (sugar ok, no milk/cream) Gatorade Juice: apple, white grape, white cranberry  Lemonade Clear bullion, consomm, broth Carbonated beverages (any kind) Strained chicken noodle soup Hard Candy   MEDICATION INSTRUCTIONS  Unless otherwise instructed, you should take regular prescription medications with a small sip of water as early as possible the morning of your procedure.      Stop taking Coumadin on 07/15/10  (5 days before procedure).  Additional medication instructions: You will be contaced by our office prior to your procedure for directions on holding your Coumadin/Warfarin.  If you do not hear from our office 1 week prior to your scheduled procedure, please call [phone] to discuss.             OTHER INSTRUCTIONS  You will need a responsible adult at least 75 years of age to accompany you and drive you home.   This person must remain in the waiting room during your procedure.  Wear loose fitting clothing that  is easily removed.  Leave jewelry and other valuables at home.  However, you may wish to bring a book to read or an iPod/MP3 player to listen to music as you wait for your procedure to start.  Remove all body piercing jewelry and leave at home.  Total time from sign-in until discharge is approximately 2-3 hours.  You should go home directly after your procedure and rest.  You can resume normal activities the day after your procedure.  The day of your procedure you should not:   Drive   Make legal decisions   Operate machinery   Drink alcohol   Return to work  You will receive specific instructions about eating, activities and medications before you leave.    The above instructions have been reviewed and explained to me by   Chales Abrahams CMA (AAMA)  July 04, 2010 8:50 AM     I fully understand and can verbalize these instructions over the phone mailed to home Date 07/04/10

## 2010-11-07 NOTE — Assessment & Plan Note (Signed)
Summary: DISCUSS PROLIA FOR OSTEOPEROSIS/NWS   Vital Signs:  Patient profile:   77 year old female Height:      65 inches Weight:      164.25 pounds BMI:     27.43 O2 Sat:      95 % on Room air Temp:     98.7 degrees F oral Pulse rate:   70 / minute BP sitting:   150 / 80  (left arm) Cuff size:   regular  Vitals Entered ByZella Ball Ewing (January 13, 2010 11:25 AM)  O2 Flow:  Room air CC: Discuss Prolia, BP/RE   Primary Care Provider:  Jonny Ruiz  CC:  Discuss Prolia and BP/RE.  History of Present Illness: here to f/u;  Pt denies CP, sob, doe, wheezing, orthopnea, pnd, worsening LE edema, palps, dizziness or syncope   Pt denies new neuro symptoms such as headache, facial or extremity weakness   Trying to follow lower chol diet, not interested in taking the statin, does not want the chance of side effect.  O/w good complaicne with meds and good tolerability.  Has been taking the alendronate ok, but would be interested in prolia if OK with copay and avoid the rules to take the pill.    Problems Prior to Update: 1)  Preventive Health Care  (ICD-V70.0) 2)  Frequency, Urinary  (ICD-788.41) 3)  Dizziness, Chronic  (ICD-780.4) 4)  Hematochezia  (ICD-578.1) 5)  Overactive Bladder  (ICD-596.51) 6)  Colonic Polyps, Hx of  (ICD-V12.72) 7)  Skin Lesion  (ICD-709.9) 8)  Facial Pain  (ICD-784.0) 9)  Coronary Artery Disease  (ICD-414.00) 10)  Frequency, Urinary  (ICD-788.41) 11)  Cellulitis, Leg, Left  (ICD-682.6) 12)  Headache  (ICD-784.0) 13)  Facial Pain  (ICD-784.0) 14)  Chronic Rhinitis  (ICD-472.0) 15)  Sinusitis- Acute-nos  (ICD-461.9) 16)  Fatigue  (ICD-780.79) 17)  Uri  (ICD-465.9) 18)  Dysuria  (ICD-788.1) 19)  Sinusitis- Acute-nos  (ICD-461.9) 20)  Hematochezia  (ICD-578.1) 21)  Viral Infection  (ICD-079.99) 22)  COPD  (ICD-496) 23)  Pulmonary Hypertension, Secondary  (ICD-416.8) 24)  Dyspnea  (ICD-786.09) 25)  Headache  (ICD-784.0) 26)  Gait Imbalance  (ICD-781.2) 27)   Unspecified Hearing Loss  (ICD-389.9) 28)  Sinusitis- Acute-nos  (ICD-461.9) 29)  Osteoarthritis, Knee, Right  (ICD-715.96) 30)  Carotid Artery Stenosis, Right  (ICD-433.10) 31)  Diverticulosis, Colon  (ICD-562.10) 32)  Irritable Bowel Syndrome, Hx of  (ICD-V12.79) 33)  Common Migraine  (ICD-346.10) 34)  Depression  (ICD-311) 35)  Cyst/pseudocyst, Pancreas  (ICD-577.2) 36)  Constipation, Chronic, Hx of  (ICD-V12.79) 37)  Hyperlipidemia  (ICD-272.4) 38)  Allergic Rhinitis  (ICD-477.9) 39)  Pulmonary Embolism, Hx of  (ICD-V12.51) 40)  Anxiety  (ICD-300.00) 41)  Transient Ischemic Attack, Hx of  (ICD-V12.50) 42)  Osteoporosis  (ICD-733.00) 43)  Hypertension  (ICD-401.9) 44)  Gerd  (ICD-530.81)  Medications Prior to Update: 1)  Ecotrin Low Strength 81 Mg  Tbec (Aspirin) .[name].. 1 By Mouth Daily 2)  Alendronate Sodium 70 Mg  Tabs (Alendronate Sodium) .[name].. 1 By Mouth Each Thursday. 3)  Omeprazole 20 Mg Cpdr (Omeprazole) .[name].. 1 By Mouth At Bedtime 4)  Warfarin Sodium 5 Mg  Tabs (Warfarin Sodium) .... Take As Directed - Currently 2.5 Mg Except 5 Mg On Thurs 5)  Losartan Potassium 100 Mg Tabs (Losartan Potassium) .[name].. 1 By Mouth Once Daily 6)  Proventil Hfa 108 (90 Base) Mcg/act Aers (Albuterol Sulfate) .... Up To 2 Puffs Every 4 Hours As Needed (Rescue Inhaler)  7)  Simvastatin 20 Mg Tabs (Simvastatin) .[name].. 1 By Mouth Once Daily 8)  Clariitin 10 Mg 9)  Nasonex 50 Mcg/act Susp (Mometasone Furoate) .... 2 Spray/side Once Daily  Current Medications (verified): 1)  Ecotrin Low Strength 81 Mg  Tbec (Aspirin) .[name].. 1 By Mouth Daily 2)  Alendronate Sodium 70 Mg  Tabs (Alendronate Sodium) .[name].. 1 By Mouth Each Thursday. 3)  Omeprazole 20 Mg Cpdr (Omeprazole) .[name].. 1 By Mouth At Bedtime 4)  Warfarin Sodium 5 Mg  Tabs (Warfarin Sodium) .... Take As Directed - Currently 2.5 Mg Except 5 Mg On Thurs 5)  Losartan Potassium 100 Mg Tabs (Losartan Potassium) .[name].. 1 By Mouth Once Daily 6)  Proventil Hfa 108 (90  Base) Mcg/act Aers (Albuterol Sulfate) .... Up To 2 Puffs Every 4 Hours As Needed (Rescue Inhaler) 7)  Clariitin 10 Mg 8)  Nasonex 50 Mcg/act Susp (Mometasone Furoate) .... 2 Spray/side Once Daily 9)  Amlodipine Besylate 2.5 Mg Tabs (Amlodipine Besylate) .[name].. 1po Once Daily 10)  Welchol 3.75 Gm Pack (Colesevelam Hcl) .[name].. 1 Pkt By Mouth Once Daily  Allergies (verified): 1)  ! Sulfa 2)  ! * Evista 3)  Augmentin 4)  * Omnaris  Past History:  Past Medical History: Last updated: 12/08/2009 PULMONARY HYPERTENSION, SECONDARY (ICD-416.8) DYSPNEA (ICD-786.09) HEADACHE (ICD-784.0) GAIT IMBALANCE (ICD-781.2) UNSPECIFIED HEARING LOSS (ICD-389.9) SINUSITIS- ACUTE-NOS (ICD-461.9) OSTEOARTHRITIS, KNEE, RIGHT (ICD-715.96) CAROTID ARTERY STENOSIS, RIGHT (ICD-433.10) DIVERTICULOSIS, COLON (ICD-562.10) IRRITABLE BOWEL SYNDROME, HX OF (ICD-V12.79) COMMON MIGRAINE (ICD-346.10) DEPRESSION (ICD-311) CYST/PSEUDOCYST, PANCREAS (ICD-577.2) CONSTIPATION, CHRONIC, HX OF (ICD-V12.79) HYPERLIPIDEMIA (ICD-272.4) ALLERGIC RHINITIS (ICD-477.9) PULMONARY EMBOLISM, HX OF (ICD-V12.51) ANXIETY (ICD-300.00) TRANSIENT ISCHEMIC ATTACK, HX OF (ICD-V12.50) OSTEOPOROSIS (ICD-733.00) HYPERTENSION (ICD-401.9) GERD (ICD-530.81) Allergic rhinitis Hyperlipidemia Coronary artery disease- by coronary ca+2 on CT chest 07/2008 Colonic polyps, hx of OAB  Past Surgical History: Last updated: 08/05/2007 Hysterectomy egd 1999 Cholecystectomy Tonsillectomy Appendectomy s/p sinus surgury  Social History: Last updated: 12/08/2009 Never Smoked Alcohol use-no Drug use-no Retired  Risk Factors: Smoking Status: never (10/17/2007)  Review of Systems       all otherwise negative per pt -    Physical Exam  General:  alert and well-developed.   Head:  normocephalic and atraumatic.   Eyes:  vision grossly intact, pupils equal, and pupils round.   Ears:  R ear normal and L ear normal.   Nose:  no external  deformity and no nasal discharge.   Mouth:  no gingival abnormalities and pharynx pink and moist.   Neck:  supple and no masses.   Lungs:  normal respiratory effort and normal breath sounds.   Heart:  normal rate and regular rhythm.   Msk:  no joint tenderness and no joint swelling.   Extremities:  no edema, no erythema  Neurologic:  alert & oriented X3 and cranial nerves II-XII intact.     Impression & Recommendations:  Problem # 1:  HYPERTENSION (ICD-401.9)  Her updated medication list for this problem includes:    Losartan Potassium 100 Mg Tabs (Losartan potassium) .[name]... 1 by mouth once daily    Amlodipine Besylate 2.5 Mg Tabs (Amlodipine besylate) .[name]... 1po once daily to add the lower dose amlodipine as above , cont other meds, f/u BP at home and next visit  BP today: 150/80 Prior BP: 152/70 (12/08/2009)  Labs Reviewed: K+: 4.5 (04/06/2009) Creat: : 0.5 (04/06/2009)   Chol: 230 (04/06/2009)   HDL: 63.70 (04/06/2009)   LDL: DEL (07/15/2008)   TG: 113.0 (04/06/2009)  Problem # 2:  HYPERLIPIDEMIA (ICD-272.4)  The following medications were removed from the medication list:    Simvastatin 20 Mg Tabs (Simvastatin) .[name]... 1 by mouth once daily Her updated medication list for this problem includes:    Welchol 3.75 Gm Pack (Colesevelam hcl) .[name]... 1 pkt by mouth once daily  Labs Reviewed: SGOT: 30 (04/06/2009)   SGPT: 27 (04/06/2009)   HDL:63.70 (04/06/2009), 61.8 (07/15/2008)  LDL:DEL (07/15/2008)  Chol:230 (04/06/2009), 233 (07/15/2008)  Trig:113.0 (04/06/2009), 130 (07/15/2008) does not wish to take the statin, will try welchol as above, Pt to continue diet efforts, good med tolerance; to check labs - goal LDL less than 70   Problem # 3:  OSTEOPOROSIS (ICD-733.00)  Her updated medication list for this problem includes:    Alendronate Sodium 70 Mg Tabs (Alendronate sodium) .[name]... 1 by mouth each thursday. d/w pt last dxa , pt qualifies for prolia adn will try try to arrange if  works for her financially  Complete Medication List: 1)  Ecotrin Low Strength 81 Mg Tbec (Aspirin) .[name].. 1 by mouth daily 2)  Alendronate Sodium 70 Mg Tabs (Alendronate sodium) .[name].. 1 by mouth each thursday. 3)  Omeprazole 20 Mg Cpdr (Omeprazole) .[name].. 1 by mouth at bedtime 4)  Warfarin Sodium 5 Mg Tabs (Warfarin sodium) .... Take as directed - currently 2.5 mg except 5 mg on thurs 5)  Losartan Potassium 100 Mg Tabs (Losartan potassium) .[name].. 1 by mouth once daily 6)  Proventil Hfa 108 (90 Base) Mcg/act Aers (Albuterol sulfate) .... Up to 2 puffs every 4 hours as needed (rescue inhaler) 7)  Clariitin 10 Mg  8)  Nasonex 50 Mcg/act Susp (Mometasone furoate) .... 2 spray/side once daily 9)  Amlodipine Besylate 2.5 Mg Tabs (Amlodipine besylate) .[name].. 1po once daily 10)  Welchol 3.75 Gm Pack (Colesevelam hcl) .[name].. 1 pkt by mouth once daily  Patient Instructions: 1)  the nurse will contact you about the copay cost for prolia 2)  if you start this, you can stop the alendronate 3)  Please take all new medications as prescribed - the welchol, and lower dose amlodipine 2.5 mg 4)  Please schedule a follow-up appointment in 2 months. Prescriptions: WELCHOL 3.75 GM PACK (COLESEVELAM HCL) 1 pkt by mouth once daily  #30 x 11   Entered and Authorized by:   Corwin Levins MD   Signed by:   Corwin Levins MD on 01/13/2010   Method used:   Electronically to        [facility] [phone]* (retail)       [address]       [place], [place]  [address]       Ph: [phone] or [phone]       Fax: [phone]   RxID:   [id number] AMLODIPINE BESYLATE 2.5 MG TABS (AMLODIPINE BESYLATE) 1po once daily  #30 x 11   Entered and Authorized by:   Corwin Levins MD   Signed by:   Corwin Levins MD on 01/13/2010   Method used:   Electronically to        [facility] [phone]* (retail)       [address]       [place], [place]  [address]       Ph: [phone] or [phone]       Fax: [phone]   RxID:    [id number]

## 2010-11-07 NOTE — Progress Notes (Signed)
Summary: Medication denial  Phone Note From Pharmacy   Caller: [facility] Summary of Call: Pharmacy denied Proventil HFA because is not a covered drug. Patient needs to  first try both of the following covered medications; ProAir HFA and Maxair autohaler. Initial call taken by: Robin Ewing CMA Duncan Dull),  April 19, 2010 11:24 AM  Follow-up for Phone Call        ok for proair hfa - to robin to handle Follow-up by: Corwin Levins MD,  April 19, 2010 11:38 AM    New/Updated Medications: PROAIR HFA 108 (90 BASE) MCG/ACT AERS (ALBUTEROL SULFATE) 2 puffs every 4 to 6 hours as needed Prescriptions: PROAIR HFA 108 (90 BASE) MCG/ACT AERS (ALBUTEROL SULFATE) 2 puffs every 4 to 6 hours as needed  #1 x 6   Entered by:   Scharlene Gloss CMA (AAMA)   Authorized by:   Corwin Levins MD   Signed by:   Scharlene Gloss CMA (AAMA) on 04/19/2010   Method used:   Electronically to        [facility] [phone][facility] (retail)       [address]       [place], [place]  [address]       Ph: [phone] or [phone]       Fax: [phone]   RxID:   [id number]

## 2010-11-07 NOTE — Letter (Signed)
Summary: [facility]  [facility]   Imported By: Lester Guntown 07/03/2010 10:33:10  _____________________________________________________________________  External Attachment:    Type:   Image     Comment:   External Document

## 2010-11-07 NOTE — Op Note (Signed)
Summary: Esophageal Manometry                    [place]  Patient:    CHRISTAL, LAGERSTROM                      MRN: [id number] Proc. Date: 06/24/00 Adm. Date:  [id number] Disc. Date: [id number] Attending:  Starr Sinclair CC:         Venita Lick. Pleas Koch., M.D. [facility]                           Procedure Report  PROCEDURE: Esophageal manometry.  ENDOSCOPIST: Venita Lick. Pleas Koch., M.D.  RESULT: The patient tolerated the procedure well without difficulties.  UPPER ESOPHAGEAL SPHINCTER STUDY: There was a slight increase in duration at the upper esophageal sphincter and a slight increase in peak pressure in the pharynx.  The remainder of the upper esophageal sphincter and pharyngeal study was normal.  In the upper esophageal body 60% of the five swallows were peristaltic, 20% simultaneous, and 20% retrograde.  Lower esophageal body showed normal amplitude and borderline-normal peristalsis.  Eleven swallows were performed and 73% resulted in peristaltic activity on one tracing and 82% resulted in peristaltic activity on another tracing; the other swallows were either nontransmitted, retrograde, or simultaneous.  LOWER ESOPHAGEAL SPHINCTER STUDY: Resting pressure was 28.3 mmHg with residual pressure 3.0 mmHg, representing 86% relaxation.  IMPRESSION: Borderline abnormal motility study with 60% nonperistaltic contractions in the upper esophageal body.  Unfortunately, only five swallows were performed.  Borderline peristaltic activity in the lower esophageal body.  RECOMMENDATIONS: Nonspecific findings.  Plan for dietary adjustment and monitoring of symptoms. DD:  06/26/00 TD:  06/27/00 Job: [id number]

## 2010-11-07 NOTE — Procedures (Signed)
Summary: Endoscopic Ultrasound  Patient: [name] Note: All result statuses are Final unless otherwise noted.  Tests: (1) Endoscopic Ultrasound (EUS)  EUS Endoscopic Ultrasound                             DONE     [facility]     [address] [place], [place]  [address]           ENDOSCOPIC ULTRASOUND PROCEDURE REPORT           PATIENT:  [name], [name]  MR#:  [id number]     BIRTHDATE:  1929/10/14  GENDER:  female           ENDOSCOPIST:  Rachael Fee, MD     REFERRED BY:  Venita Lick. Russella Dar, M.D., [facility]           PROCEDURE DATE:  07/20/2010     PROCEDURE:  Upper EUS w/FNA     ASA CLASS:  Class II     INDICATIONS:  pancreatic cyst that has grown on interval imaging;     previous EUS/FNA at [facility] several years ago suggested a "benign"     process, serial imaging for 3-4 years showed no changes           MEDICATIONS:   Fentanyl 50 mcg IV, Versed 5 mg IV, cipro 400mg  IV           DESCRIPTION OF PROCEDURE:   After the risks benefits and     alternatives of the procedure were  explained, informed consent     was obtained. The patient was then placed in the left, lateral,     decubitus postion and IV sedation was administered. Throughout the     procedure, the patient's blood pressure, pulse and oxygen     saturations were monitored continuously.  Under direct     visualization, the Pentax Radial EUS [id number] and Pentax Linear     [id number] endoscope was introduced through the  and advanced to the     .  Water was used as necessary to provide an acoustic interface.     Upon completion of the imaging, water was removed and the patient     was sent to the recovery room in satisfactory condition.     <<PROCEDUREIMAGES>>           Endoscopic findings (with radial and linear echoendoscopes):     1. Normal esophagus, stomach, duodenum           EUS findings:     1. There were three cysts in pancreas. The largest was located in     head of pancreas, measured 3.3cm  by 2.1cm and contained no     nodules, septations and there were no associated solid masses. The     cyst fluid was completely aspirated using a single pass with a 22     guage BS EUS FNA needle, color doppler guidance.  5cc of clear,     thin cyst fluid was removed, sent for [facility] (CEA and amylase)     and cytology.     2. The other two cysts were 7mm to 10mm, contained no septation,     nodules or associated masses.  These were located in neck and body     of gland.     3. None of the cysts showed clear communication with main     pancreatic  duct, which was normal.     4. Pancreatic parenchyma was otherwise normal; no solid masses and     no signs of chronic pancreatitis.     5. CBD was normal;non-dilated and without filling defects.     6. No peripancreatic adenopathy.     7. Surgically absent gallbladder.     8. Limited views of liver, spleen, portal and splenic vessels were     all normal.           Impression:     Three pancreatic cysts (head, neck and body). None had concerning     features (i.e. there were no associated nodules, masses and main     pancreatic duct did not clearly communicate with any of the     cysts). The largest cyst, in head of pancreas, was completely     aspirated and clear, thin fluid was sent for CEA, amylase and     cytology testing. Await these results for final recommendations.     She will complete 3 days of cipro 500mg  twice daily to decrease     chance of infection.           ______________________________     Rachael Fee, MD           n.     eSIGNED:   Rachael Fee at 07/20/2010 11:20 AM           Ricki Rodriguez, [phone]  Note: An exclamation mark (!) indicates a result that was not dispersed into the flowsheet. Document Creation Date: 07/20/2010 11:21 AM _______________________________________________________________________  (1) Order result status: Final Collection or observation date-time: 07/20/2010 11:07 Requested  date-time:  Receipt date-time:  Reported date-time:  Referring Physician:   Ordering Physician: Rob Bunting [phone]) Specimen Source:  Source: Launa Grill Order Number: [id number] Lab site:   Appended Document: Endoscopic Ultrasound she can restart coumadin and lovenox tomorrow, she is aware

## 2010-11-07 NOTE — Consult Note (Signed)
Summary: [facility]  [facility]   Imported By: Sherian Rein 07/04/2010 07:07:00  _____________________________________________________________________  External Attachment:    Type:   Image     Comment:   External Document

## 2010-11-07 NOTE — Assessment & Plan Note (Signed)
Summary: F/U POST MRI/MRCP, saw PA   History of Present Illness Visit Type: Follow-up Visit Primary GI MD: Elie Goody MD [facility] Primary Provider: Oliver Barre, MD Requesting Provider: na Chief Complaint: Patient here to follow up on her appt with the PA and discuss her MRI. She states that she has lower abdominal bloating and nausea. She is also now complaining of rectal bleeding with she thinks is hemorrhoids.  History of Present Illness:   This is a 77 year old female previously followed by me who transferred her care to Dr. Vida Rigger and now is transferring back to our practice.  She has a history of a pancreatic head cyst diagnosed in 2004.  She underwent evaluation with endoscopic ultrasound with fluid aspiration at [facility] in 2004 showing benign studies. Subsequent imaging studies revealed that the cyst did not change in size and routine follow up was discontinued. She recently underwent reevaluation with MRI/MRCP showing the cyst has slightly enlarged: The cystic lesion in the uncinate process region of the pancreatic head has increased slightly in size since 2004.  It measured approximately 17 x 16 mm and now measures 23 x 24 mm.  No worrisome MR imaging features such as nodularity, enhancement or septations.  There are small cystic changes involving the pancreatic duct side branches which are also stable.  She also complains of small amounts of bright red blood per rectum with bowel movements. On occasion. She underwent colonoscopy by Dr. Ewing Schlein in October 2009 that showed small internal and external hemorrhoids, as well as a hyperplastic polyp and diverticulosis.  In addition, she complains of frequent gas, bloating and nausea. The symptoms have improved since using an over-the-counter gas medication and Phenergan.   GI Review of Systems    Reports bloating and  nausea.      Denies abdominal pain, acid reflux, belching, chest pain, dysphagia with liquids, dysphagia with solids,  heartburn, loss of appetite, vomiting, vomiting blood, weight loss, and  weight gain.      Reports rectal bleeding.     Denies anal fissure, black tarry stools, change in bowel habit, constipation, diarrhea, diverticulosis, fecal incontinence, heme positive stool, hemorrhoids, irritable bowel syndrome, jaundice, light color stool, liver problems, and  rectal pain.   Current Medications (verified): 1)  Ecotrin Low Strength 81 Mg  Tbec (Aspirin) .[name].. 1 By Mouth Daily 2)  Zantac 150 Maximum Strength 150 Mg Tabs (Ranitidine Hcl) .[name].. 1po Two Times A Day 3)  Warfarin Sodium 5 Mg  Tabs (Warfarin Sodium) .... Take As Directed - Currently 2.5 Mg Except 5 Mg On Thurs 4)  Losartan Potassium 100 Mg Tabs (Losartan Potassium) .[name].. 1 By Mouth Once Daily 5)  Claritin 10 Mg Tabs (Loratadine) .... Take One By Mouth Once Daily 6)  Nasonex 50 Mcg/act Susp (Mometasone Furoate) .... 2 Spray/side Once Daily As Needed 7)  Amlodipine Besylate 2.5 Mg Tabs (Amlodipine Besylate) .[name].. 1po Once Daily 8)  Proair Hfa 108 (90 Base) Mcg/act Aers (Albuterol Sulfate) .... 2 Puffs Every 4 To 6 Hours As Needed 9)  Tums 500 Mg Chew (Calcium Carbonate Antacid) .... As Needed 10)  Promethazine Hcl 25 Mg Tabs (Promethazine Hcl) .... Take 1/2 Tab Every 6-8 Hours As Needed For Nausea 11)  Papaya Enzyme  Chew (Digestive Enzymes) .... Chew 2 Tabs With Meals As Needed  Allergies: 1)  ! Sulfa 2)  ! * Evista 3)  ! * Scallops 4)  Augmentin 5)  * Omnaris  Past History:  Past Medical History: PULMONARY HYPERTENSION,  SECONDARY (ICD-416.8) GAIT IMBALANCE (ICD-781.2) UNSPECIFIED HEARING LOSS (ICD-389.9) SINUSITIS- ACUTE-NOS (ICD-461.9) OSTEOARTHRITIS, KNEE, RIGHT (ICD-715.96) CAROTID ARTERY STENOSIS, RIGHT (ICD-433.10) DIVERTICULOSIS, COLON (ICD-562.10) IRRITABLE BOWEL SYNDROME, HX OF (ICD-V12.79) COMMON MIGRAINE (ICD-346.10) DEPRESSION (ICD-311) PANCREATIC CYST     CEA=23.8, amylase < 30, cytology negative in 2004 CONSTIPATION,  CHRONIC, HX OF (ICD-V12.79) HYPERLIPIDEMIA (ICD-272.4) ALLERGIC RHINITIS (ICD-477.9) PULMONARY EMBOLISM, HX OF (ICD-V12.51) ANXIETY (ICD-300.00) TRANSIENT ISCHEMIC ATTACK, HX OF (ICD-V12.50) OSTEOPOROSIS (ICD-733.00) HYPERTENSION (ICD-401.9) GERD (ICD-530.81) Allergic rhinitis Hyperlipidemia Coronary artery disease- by coronary ca+2 on CT chest 07/2008 Colonic polyps, hx of hyperplastic OAB DVT Esophageal Stricture  Pulmonary embloism  Internal/external hemorrhoids  Past Surgical History: Reviewed history from 08/05/2007 and no changes required. Hysterectomy egd 1999 Cholecystectomy Tonsillectomy Appendectomy s/p sinus surgury  Family History: Reviewed history from 06/06/2010 and no changes required. mother with Mi at 29  yo, died at 55yo  with MI father with stroke at 25 yo Family History of Breast Cancer: daughter  Social History: Reviewed history from 06/06/2010 and no changes required. Never Smoked Alcohol use-no Drug use-no Retired Widowed Daily Caffeine Use  Review of Systems       The patient complains of allergy/sinus, shortness of breath, and urination - excessive.         The pertinent positives and negatives are noted as above and in the HPI. All other ROS were reviewed and were negative.   Vital Signs:  Patient profile:   77 year old female Height:      65 inches Weight:      163.2 pounds BMI:     27.26 Pulse rate:   76 / minute Pulse rhythm:   regular BP sitting:   130 / 66  (left arm) Cuff size:   regular  Vitals Entered By: Harlow Mares CMA Duncan Dull) (July 03, 2010 9:39 AM)  Physical Exam  General:  Well developed, well nourished, no acute distress. Head:  Normocephalic and atraumatic. Eyes:  PERRLA, no icterus. Ears:  Normal auditory acuity. Mouth:  No deformity or lesions, dentition normal. Neck:  Supple; no masses or thyromegaly. Lungs:  Clear throughout to auscultation. Heart:  Regular rate and rhythm; no murmurs, rubs,  or  bruits. Abdomen:  Soft, nontender and nondistended. No masses, hepatosplenomegaly or hernias noted. Normal bowel sounds. Rectal:  Normal exam. hemocult negative.   Msk:  Symmetrical with no gross deformities. Normal posture. Pulses:  Normal pulses noted. Extremities:  No clubbing, cyanosis, edema or deformities noted. Neurologic:  Alert and  oriented x4;  grossly normal neurologically. Cervical Nodes:  No significant cervical adenopathy. Inguinal Nodes:  No significant inguinal adenopathy. Psych:  Alert and cooperative. Normal mood and affect.  Impression & Recommendations:  Problem # 1:  ABNORMAL FINDINGS GI TRACT (ICD-793.4) Pancreatic head cyst, which is slightly increased in size. Evaluation in 2004 and subsequent imaging studies suggested a benign etiology, and I still suspect this is a benign cyst that has slightly enlarged. I will review the case with Dr. Christella Hartigan to see if a repeat endoscopic ultrasound with fluid aspiration is indicated. Another option would be a followup imaging study in 6-12 months.  Problem # 2:  HEMATOCHEZIA (ICD-578.1) Small-volume hematochezia, and the patient maintained on warfarin. She has known internal and external hemorrhoids from colonoscopy 2 years ago. Treatment for symptomatic hemorrhoids with Preparation H suppositories b.i.d. p.r.n.   Problem # 3:  FLATULENCE-GAS-BLOATING (ICD-787.3) Gas, bloating, and nausea. She has irritable bowel syndrome. Begin a low gas diet, over the counter gas medication qid as needed and  Align once daily.  Problem # 4:  COUMADIN THERAPY (ICD-V58.61)  Problem # 5:  COLONIC POLYPS, HX OF (ICD-V12.72) She has a history of hyperplastic colon polyps and, given and age and comorbidities, will not plan for future surveillance colonoscopies.  Patient Instructions: 1)  Start Align one capsule by mouth once daily x 1 month. 2)  Take Gas-X or phazyme four times a day as needed for gas and bloating.  3)  Excessive Gas Diet handout  given.  4)   Hemorrhoids brochure given.  5)  Copy sent to : Oliver Barre, MD 6)  The medication list was reviewed and reconciled.  All changed / newly prescribed medications were explained.  A complete medication list was provided to the patient / caregiver.  Appended Document: F/U POST MRI/MRCP, saw PA reviewed imaging, past history.  Interval growth in asymptomatic panc cyst.  Repeat EUS with FNA is reasonable next step.    Patty, can you set her up with upper EUS, radial +/- linear.  60 min.  dx pancreatic cyst.  Needs to hold coumadin for 5 days prior (sent anti-coag note to Dr. Jonny Ruiz.).  Next avaialbe EUS thursday,  thanks  Appended Document: F/U POST MRI/MRCP, saw PA pt scheduled for EUS 07/20/10

## 2010-11-07 NOTE — Assessment & Plan Note (Signed)
Summary: req ov, problem med/#/cd   Vital Signs:  Patient profile:   77 year old female Height:      65 inches Weight:      163 pounds BMI:     27.22 O2 Sat:      94 % on Room air Temp:     98.1 degrees F oral Pulse rate:   68 / minute BP supine:   146 / 72 BP sitting:   142 / 70  (left arm) BP standing:   150 / 70 Cuff size:   regular  Vitals Entered ByZella Ball Ewing (October 14, 2009 10:25 AM)  O2 Flow:  Room air CC: bleeding problem/RE   Primary Care Provider:  Jonny Ruiz  CC:  bleeding problem/RE.  History of Present Illness: f/u BP 120/60 after resting today;  here with 4 dyas of "bleeding problem" where she noticed drops of  blood on the floor when she stood up after urination as well as blood in the toilet bowel, occured tues, adn yesterday (very mild only yesterday);  has felt somewhat weak, trying to stay off her feet and having burning "funny" discomfort to the bilat mid abd area;  has had some nausea, no vomiting; no fever;  no obvious constipation or unusual straining at stool;  has had some dizziness but not clear if more than usual;  no falls or syncope, Pt denies CP, or owrsening sob, doe, wheezing, orthopnea, pnd, worsening LE edema, palps, dizziness or syncope   Anxiety is much higher this wk due to this.  On chronic coumadin - last INR 1.7 so coumadin increased for one day only (the mon before last, approx 10 dyas ago);  with plan to re0-check in 3 wks.  usually goes once per mo, has been stable. No rectal pain but some slight burning, has hx of hemorrhoids but does not think active now.  Has hx of diverticulosis, but no active bleed, no hosnps for GI bleeding.  On chronic coumadin for hx of dvt/PE.  No other bruise or bleed.  Last colonoscopy 2010.  Problems Prior to Update: 1)  Dizziness, Chronic  (ICD-780.4) 2)  Hematochezia  (ICD-578.1) 3)  Overactive Bladder  (ICD-596.51) 4)  Colonic Polyps, Hx of  (ICD-V12.72) 5)  Skin Lesion  (ICD-709.9) 6)  Facial Pain   (ICD-784.0) 7)  Coronary Artery Disease  (ICD-414.00) 8)  Frequency, Urinary  (ICD-788.41) 9)  Cellulitis, Leg, Left  (ICD-682.6) 10)  Headache  (ICD-784.0) 11)  Facial Pain  (ICD-784.0) 12)  Chronic Rhinitis  (ICD-472.0) 13)  Sinusitis- Acute-nos  (ICD-461.9) 14)  Fatigue  (ICD-780.79) 15)  Uri  (ICD-465.9) 16)  Dysuria  (ICD-788.1) 17)  Sinusitis- Acute-nos  (ICD-461.9) 18)  Hematochezia  (ICD-578.1) 19)  Viral Infection  (ICD-079.99) 20)  COPD  (ICD-496) 21)  Pulmonary Hypertension, Secondary  (ICD-416.8) 22)  Dyspnea  (ICD-786.09) 23)  Headache  (ICD-784.0) 24)  Gait Imbalance  (ICD-781.2) 25)  Unspecified Hearing Loss  (ICD-389.9) 26)  Sinusitis- Acute-nos  (ICD-461.9) 27)  Osteoarthritis, Knee, Right  (ICD-715.96) 28)  Carotid Artery Stenosis, Right  (ICD-433.10) 29)  Diverticulosis, Colon  (ICD-562.10) 30)  Irritable Bowel Syndrome, Hx of  (ICD-V12.79) 31)  Common Migraine  (ICD-346.10) 32)  Depression  (ICD-311) 33)  Cyst/pseudocyst, Pancreas  (ICD-577.2) 34)  Constipation, Chronic, Hx of  (ICD-V12.79) 35)  Hyperlipidemia  (ICD-272.4) 36)  Allergic Rhinitis  (ICD-477.9) 37)  Pulmonary Embolism, Hx of  (ICD-V12.51) 38)  Anxiety  (ICD-300.00) 39)  Transient Ischemic Attack, Hx of  (  ICD-V12.50) 40)  Osteoporosis  (ICD-733.00) 41)  Hypertension  (ICD-401.9) 42)  Gerd  (ICD-530.81)  Medications Prior to Update: 1)  Ecotrin Low Strength 81 Mg  Tbec (Aspirin) .[name].. 1 By Mouth Daily 2)  Alendronate Sodium 70 Mg  Tabs (Alendronate Sodium) .[name].. 1 By Mouth Each Thursday. 3)  Omeprazole 20 Mg Cpdr (Omeprazole) .[name].. 1 By Mouth At Bedtime 4)  Warfarin Sodium 5 Mg  Tabs (Warfarin Sodium) .... Take As Directed 5)  Diltiazem Hcl Cr 240 Mg Xr24h-Cap (Diltiazem Hcl) .[name].. 1 By Mouth Once Daily 6)  Proventil Hfa 108 (90 Base) Mcg/act Aers (Albuterol Sulfate) .... Up To 2 Puffs Every 4 Hours As Needed (Rescue Inhaler) 7)  Simvastatin 40 Mg Tabs (Simvastatin) .... Take 0.5  Tablet By  Mouth Daily At Bedtime  Current Medications (verified): 1)  Ecotrin Low Strength 81 Mg  Tbec (Aspirin) .[name].. 1 By Mouth Daily 2)  Alendronate Sodium 70 Mg  Tabs (Alendronate Sodium) .[name].. 1 By Mouth Each Thursday. 3)  Omeprazole 20 Mg Cpdr (Omeprazole) .[name].. 1 By Mouth At Bedtime 4)  Warfarin Sodium 5 Mg  Tabs (Warfarin Sodium) .... Take As Directed - Currently 2.5 Mg Except 5 Mg On Thurs 5)  Losartan Potassium 100 Mg Tabs (Losartan Potassium) .[name].. 1 By Mouth Once Daily 6)  Proventil Hfa 108 (90 Base) Mcg/act Aers (Albuterol Sulfate) .... Up To 2 Puffs Every 4 Hours As Needed (Rescue Inhaler) 7)  Simvastatin 40 Mg Tabs (Simvastatin) .... Take 0.5  Tablet By Mouth Daily At Bedtime  Allergies (verified): 1)  ! Sulfa 2)  ! * Evista 3)  Augmentin 4)  * Omnaris  Past History:  Past Medical History: Last updated: 05/26/2009 PULMONARY HYPERTENSION, SECONDARY (ICD-416.8) DYSPNEA (ICD-786.09) HEADACHE (ICD-784.0) GAIT IMBALANCE (ICD-781.2) UNSPECIFIED HEARING LOSS (ICD-389.9) SINUSITIS- ACUTE-NOS (ICD-461.9) OSTEOARTHRITIS, KNEE, RIGHT (ICD-715.96) CAROTID ARTERY STENOSIS, RIGHT (ICD-433.10) DIVERTICULOSIS, COLON (ICD-562.10) IRRITABLE BOWEL SYNDROME, HX OF (ICD-V12.79) COMMON MIGRAINE (ICD-346.10) DEPRESSION (ICD-311) CYST/PSEUDOCYST, PANCREAS (ICD-577.2) CONSTIPATION, CHRONIC, HX OF (ICD-V12.79) HYPERLIPIDEMIA (ICD-272.4) ALLERGIC RHINITIS (ICD-477.9) PULMONARY EMBOLISM, HX OF (ICD-V12.51) ANXIETY (ICD-300.00) TRANSIENT ISCHEMIC ATTACK, HX OF (ICD-V12.50) OSTEOPOROSIS (ICD-733.00) HYPERTENSION (ICD-401.9) GERD (ICD-530.81) Allergic rhinitis Hyperlipidemia Coronary artery disease- by coronary ca+2 on CT chest 07/2008 Colonic polyps, hx of OAB  Past Surgical History: Last updated: 08/05/2007 Hysterectomy egd 1999 Cholecystectomy Tonsillectomy Appendectomy s/p sinus surgury  Social History: Last updated: 07/15/2008 Never Smoked Alcohol use-no  Risk Factors: Smoking  Status: never (10/17/2007)  Review of Systems       all otherwise negative per pt - 12 system review done   Physical Exam  General:  alert and well-developed.   Head:  normocephalic and atraumatic.   Eyes:  vision grossly intact, pupils equal, and pupils round.   Ears:  R ear normal and L ear normal.   Nose:  no external deformity and no nasal discharge.   Mouth:  no gingival abnormalities and pharynx pink and moist.   Neck:  supple and no masses.   Lungs:  normal respiratory effort and normal breath sounds.   Heart:  normal rate and regular rhythm.   Abdomen:  soft, non-tender, and normal bowel sounds.   Rectal:  small ext mild tender hemorrhoid noted, non bleeding, DRE neg  - heme neg Extremities:  no edema, no erythema    Impression & Recommendations:  Problem # 1:  HEMATOCHEZIA (ICD-578.1) presumed hemhorrhoidal or divertucular, no pain or blood today;  not orthostatic and heme neg;  ok to check labs and follow, to the ER for  any worsening s/s Orders: TLB-CBC Platelet - w/Differential (85025-CBCD) TLB-PT (Protime) (85610-PTP)  Problem # 2:  DIZZINESS, CHRONIC (ICD-780.4) stable overall by hx and exam, ok to continue meds/tx as is     Problem # 3:  HYPERTENSION (ICD-401.9)  Her updated medication list for this problem includes:    Losartan Potassium 100 Mg Tabs (Losartan potassium) .[name]... 1 by mouth once daily to change to losartan 100 per pt as she feels "funny" on the diltazem  Problem # 4:  ANXIETY (ICD-300.00) o/w stable overall by hx and exam, ok to continue meds/tx as is  - declines further tx at this time  Complete Medication List: 1)  Ecotrin Low Strength 81 Mg Tbec (Aspirin) .[name].. 1 by mouth daily 2)  Alendronate Sodium 70 Mg Tabs (Alendronate sodium) .[name].. 1 by mouth each thursday. 3)  Omeprazole 20 Mg Cpdr (Omeprazole) .[name].. 1 by mouth at bedtime 4)  Warfarin Sodium 5 Mg Tabs (Warfarin sodium) .... Take as directed - currently 2.5 mg except 5 mg on thurs 5)   Losartan Potassium 100 Mg Tabs (Losartan potassium) .[name].. 1 by mouth once daily 6)  Proventil Hfa 108 (90 Base) Mcg/act Aers (Albuterol sulfate) .... Up to 2 puffs every 4 hours as needed (rescue inhaler) 7)  Simvastatin 40 Mg Tabs (Simvastatin) .... Take 0.5  tablet by mouth daily at bedtime  Patient Instructions: 1)  Please go to the Lab in the basement for your blood and/or urine tests today  2)  Continue all previous medications as before this visit  3)  please call or go to ER for worsening bleeding 4)  stop  the diltiazem 5)  start the losartan 100 mg per day 6)  please monitor your BP at home - call for > 160/90 7)  Please schedule a follow-up appointment in 1 month. Prescriptions: LOSARTAN POTASSIUM 100 MG TABS (LOSARTAN POTASSIUM) 1 by mouth once daily  #90 x 3   Entered and Authorized by:   Corwin Levins MD   Signed by:   Corwin Levins MD on 10/14/2009   Method used:   Print then Give to Patient   RxID:   [id number]

## 2010-11-07 NOTE — Op Note (Signed)
Summary: EGD   NAME:  [name], [name]             ACCOUNT NO.:  [id number]   MEDICAL RECORD NO.:  [id number]          PATIENT TYPE:  AMB   LOCATION:  ENDO                         FACILITY:  [facility]   PHYSICIAN:  Petra Kuba, M.D.    DATE OF BIRTH:  [birth year]   DATE OF PROCEDURE:  09/10/2006  DATE OF DISCHARGE:                               OPERATIVE REPORT   SURGEON:  Petra Kuba, M.D.   PROCEDURE:  Esophagogastroduodenoscopy with Savary dilatation.   INDICATIONS:  Patient with dysphagia, nondiagnostic modified barium  swallow, helped by dilatation in the past.  Consent was signed after  risks, benefits, methods and options were thoroughly discussed last week  and before any sedation.   MEDICINES USED:  Fentanyl 60 mcg, Versed 3 mg.   DESCRIPTION OF PROCEDURE:  The video endoscope was inserted by direct  vision.  The esophagus was normal.  In the distal esophagus was a small  hiatal hernia, without any obvious ring or stricture.  The scope  advanced into the stomach, advanced through a normal antrum, normal  pylorus, into a normal duodenal bulb, and around the C loop to a normal  second portion of the duodenum.  The scope was withdrawn back to the  bulb, and a good look there ruled out abnormalities in that location.  The scope was withdrawn back to the stomach and retroflexed.  The  angularis, cardia and fundus, lesser and greater curve were all normal  on retroflex visualization, except for the hiatal hernia being confirmed  in the cardia.  Straight visualization in the stomach did not reveal any  additional findings.  The scope was slowly withdrawn back to about 15 cm  in the upper esophageal sphincter, and, again, confirming a normal  esophagus.  The scope was reinserted into the stomach.  A Savary wire  was advanced.  The customary J loop was confirmed endoscopically.  The  scope was removed, advancing the wire with 1-to-1 motion.  The scope was  withdrawn.  The wire  was at the 4 black mark at the mouth, which usually  coordinates with the antrum, and the consistency J loop being in the  proper position.  At that point, the fluoro became available to [name], and  we confirmed the proper position in the stomach under fluoro.  We then  went ahead and proceeded with the Savary 14, and then 16-mm dilator over  the wire under fluoro in the customary fashion.  There were no  resistance or heme after advancing both dilators.  Once the 16 was  confirmed in the stomach, the wire was withdrawn back into the dilator.  Both were removed in tandem.  The procedure was terminated at this  juncture.  The patient tolerated the procedure well.  There was no  obvious immediate complication.   ENDOSCOPIC DIAGNOSES:  1. Small hiatal hernia.  2. Otherwise, normal esophagogastroduodenoscopy.   THERAPY:  Savary dilatation under fluoro to 16 mm.   PLAN:  See if the dilation works.  Follow up p.r.n., or in 6 weeks, and  call me sooner p.r.n., as  above.           ______________________________  Petra Kuba, M.D.     MEM/MEDQ  D:  09/10/2006  T:  09/11/2006  Job:  [id number]   cc:   Corwin Levins, MD

## 2010-11-07 NOTE — Miscellaneous (Signed)
Summary: Appointment Canceled  Appointment status changed to canceled by [employer] on 08/23/2010 4:21 PM.  Cancellation Comments --------------------- echo/f/u from previous/pulm htn  Appointment Information ----------------------- Appt Type:  CARDIOLOGY ANCILLARY VISIT      Date:  Friday, August 25, 2010      Time:  11:30 AM for 60 min   Urgency:  Routine   Made By:  Pearson Grippe  To Visit:  [id number]    Reason:  echo/f/u from previous/pulm htn  Appt Comments ------------- -- 08/23/10 16:21: ([facility]) CANCELED -- echo/f/u from previous/pulm htn -- 08/23/10 8:50: ([facility]) BOOKED -- Routine CARDIOLOGY ANCILLARY VISIT at 08/25/2010 11:30 AM for 60 min echo/f/u from previous/pulm htn

## 2010-11-07 NOTE — Medication Information (Signed)
Summary: rov/sl  Anticoagulant Therapy  Managed by: Weston Brass, PharmD PCP: Gwen Pounds MD: Clifton James MD, Cristal Deer Indication 1: Pulmonary Embolism and Infarction (ICD-415.1) Lab Used: [facility]  Site: [facility] INR POC 2.8 INR RANGE 2 - 3  Dietary changes: no    Health status changes: no    Bleeding/hemorrhagic complications: no    Recent/future hospitalizations: no    Any changes in medication regimen? no    Recent/future dental: no  Any missed doses?: no       Is patient compliant with meds? yes       Allergies: 1)  ! Sulfa 2)  ! * Evista 3)  Augmentin 4)  * Omnaris  Anticoagulation Management History:      Positive risk factors for bleeding include an age of 51 years or older and history of GI bleeding.  The bleeding index is 'intermediate risk'.  Positive CHADS2 values include History of HTN and Age > 73 years old.  The start date was 06/19/2007.  Her last INR was 2.4 ratio.  Anticoagulation responsible provider: Clifton James MD, Cristal Deer.  INR POC: 2.8.  Exp: 05/2011.    Anticoagulation Management Assessment/Plan:      The patient's current anticoagulation dose is Warfarin sodium 5 mg  tabs: take as directed - currently 2.5 mg except 5 mg on thurs.  The target INR is 2 - 3.  The next INR is due 06/27/2010.  Anticoagulation instructions were given to patient.  Results were reviewed/authorized by Weston Brass, PharmD.  She was notified by Liana Gerold, PharmD Candidate.         Prior Anticoagulation Instructions: INR 1.6   Take Coumadin 1.5 tablets (7.5 mg) today Wed, August 3rd. Take Coumadin 1.5 tablet (7.5 mg) tomorrow Clovis Cao, August 4th.   Then continue Coumadin 0.5 tab (2.5 mg) on Sun, Tues, Wed, Fri, Sat and Coumadin 1 tab (5 mg) on Mon, Thur. Return to clinic in 2 weeks.   Current Anticoagulation Instructions: INR 2.8  Continue 1/2 tablet daily except 1 tablet on Mon and Thu.  Return to clinic in 4 weeks.

## 2010-11-07 NOTE — Procedures (Signed)
Summary: EGD and biopsy   EGD  Procedure date:  06/17/2000  Findings:      Hiatal Hernia Location: [facility]   Patient Name: [name], [name] MRN: [id number] Procedure Procedures: Panendoscopy (EGD) CPT: 43235.    with biopsy(s)/brushing(s). CPT: D1846139.  Personnel: Endoscopist: Venita Lick. Russella Dar, MD, Clementeen Graham.  Referred By: Corwin Levins, MD.  Exam Location: Exam performed in [facility]. Outpatient  Patient Consent: Procedure, Alternatives, Risks and Benefits discussed, consent obtained, from patient. Consent to be contacted was given Indications Symptoms: Chest Pain. Reflux symptoms  Exam Exam Info: Maximum depth of insertion Duodenum, intended Duodenum. Patient position: on left side. Vocal cords not visualized. Gastric retroflexion performed. Images taken. ASA Classification: II.  Sedation Meds: Fentanyl 50 mcg. Versed 5 mg. Cetacaine Spray 2 sprays  Monitoring: BP and pulse monitoring done. Oximetry used. Supplemental O2 given  Fluoroscopy: Fluoroscopy was not used.  Findings Normal: Distal Esophagus.  HIATAL HERNIA: Diaphragm 40 cm from mouth. Z-line/GE Junction 38 cm from mouth. Regular, ICD9: Hernia, Hiatal: 553.3.  Assessment Abnormal examination, see findings above.  Diagnoses: 553.3: Hernia, Hiatal.   Events  Unplanned Intervention: No unplanned interventions were required.  Unplanned Events: There were no complications. Plans Medication(s): Await pathology. Continue current medications.  Patient Education: Patient given standard instructions for: Reflux.  Disposition: After procedure patient sent to recovery. After recovery patient sent home.  Scheduling: 24 Hr pH Monitoring, to [employer]. Russella Dar, MD, [facility], around Jul 01, 2000.  Motility, to [employer]. Russella Dar, MD, [facility], around Jun 24, 2000.  Clinic Visit, to [facility] T. Russella Dar, MD, [facility], around Jul 08, 2000.  Referring provider, to Corwin Levins, MD, around Jul 15, 2000.    This  report was created from the original endoscopy report, which was reviewed and signed by the above listed endoscopist.    cc: Corwin Levins, MD       SP Surgical Pathology - STATUS: Final             By: Morrie Sheldon,       Perform Date: 11Sep01 07:32  Ordered By: Pleas Koch Md , Hinda Lenis Date:  Facility: [facility]                              Department: CPATH  Service Report Text  [facility]   [address] [place], [place] [address]   [phone]    REPORT OF SURGICAL PATHOLOGY    Case #: [id number]   Patient Name: [name], [name]   PID: [id number]   Pathologist: Beulah Gandy. Luisa Hart, MD   DOB/Age 15-Jan-1930 (Age: 77) Gender: F   Date Taken: 06/17/00   Date Received: 06/17/00    FINAL DIAGNOSIS    ***MICROSCOPIC EXAMINATION AND DIAGNOSIS***    ESOPHAGUS, BIOPSIES: FINDINGS CONSISTENT WITH GASTROESOPHAGEAL   REFLUX. NO FUNGI, INTESTINAL METAPLASIA OR MALIGNANCY   IDENTIFIED.    COMMENT   No fungi identified with PAS stain and no intestinal metaplasia   with Alcian blue stain. Both controls stained appropriately.    ab   Date Reported: 06/18/00 Beulah Gandy. Luisa Hart, MD   *** Electronically Signed Out By JDP ***    Clinical information   R/O esophagitis, reflux (atb)    Epigastric pain (atb)    specimen(s) obtained   Esophageal bx    Gross Description   Esophageal  bx:   Received in formalin are tan, soft tissue fragments that are   submitted in toto. Number: 2   Size: 0.2 and 0.3 cm (JBM:atb 9/10)    ab/

## 2010-11-07 NOTE — Progress Notes (Signed)
Summary: triage   Phone Note From Other Clinic Call back at [phone]   Caller: Referral Coordinator [facility] Call For: Dr Russella Dar Reason for Call: Schedule Patient Appt Summary of Call: Dr Oliver Barre would like this patient seen asap for Pancreatic Cyst and Pseudocyst. Initial call taken by: Tawni Levy,  June 02, 2010 3:05 PM  Follow-up for Phone Call        Dr Russella Dar please review CT in EMR and advise if needs appointment, or additional testing prior to an appointment with you.   Follow-up by: Darcey Nora RN, CGRN,  June 02, 2010 3:33 PM  Additional Follow-up for Phone Call Additional follow up Details #1::        Please cut and paste prior CTs, MRs of the pancreatic cyst into [facility]. This is likely the stable, benign pancreatic cyst that she has had documented in the past. Can see me or extender at next appt.  Additional Follow-up by: Meryl Dare MD Clementeen Graham,  June 02, 2010 3:56 PM    Additional Follow-up for Phone Call Additional follow up Details #2::    patient scheduled for Mike Gip PA 06/06/10 10:30, [facility] aware of the appointment  Follow-up by: Darcey Nora RN, CGRN,  June 05, 2010 8:12 AM

## 2010-11-07 NOTE — Procedures (Signed)
Summary: EGD/[facility]  EGD/[facility]   Imported By: Sherian Rein 07/04/2010 07:09:14  _____________________________________________________________________  External Attachment:    Type:   Image     Comment:   External Document

## 2010-11-07 NOTE — Medication Information (Signed)
Summary: rov/ewj  Anticoagulant Therapy  Managed by: Shelby Dubin, PharmD, BCPS, CPP PCP: Gwen Pounds MD: Riley Kill MD, Maisie Fus Indication 1: Pulmonary Embolism and Infarction (ICD-415.1) Lab Used: [facility] Site: [facility] INR POC 1.7 INR RANGE 2 - 3  Dietary changes: no    Health status changes: no    Bleeding/hemorrhagic complications: no    Recent/future hospitalizations: no    Any changes in medication regimen? no    Recent/future dental: no  Any missed doses?: yes     Details: may have missed 1 dose during traveling at holidays  Is patient compliant with meds? yes       Allergies (verified): 1)  ! Sulfa 2)  ! * Evista 3)  Augmentin 4)  * Omnaris  Anticoagulation Management History:      The patient is taking warfarin and comes in today for a routine follow up visit.  Positive risk factors for bleeding include an age of 75 years or older and history of GI bleeding.  The bleeding index is 'intermediate risk'.  Positive CHADS2 values include History of HTN and Age > 34 years old.  The start date was 06/19/2007.  Anticoagulation responsible provider: Riley Kill MD, Maisie Fus.  INR POC: 1.7.  Cuvette Lot#: [id number].  Exp: 11/2010.    Anticoagulation Management Assessment/Plan:      The patient's current anticoagulation dose is Warfarin sodium 5 mg  tabs: take as directed.  The target INR is 2 - 3.  The next INR is due 10/31/2009.  Anticoagulation instructions were given to patient.  Results were reviewed/authorized by Shelby Dubin, PharmD, BCPS, CPP.  She was notified by Shelby Dubin PharmD, BCPS, CPP.         Prior Anticoagulation Instructions: INR 2.1  Continue on same dosage 1/2 tablet daily except 1 tablet on Thursdays.  Recheck in 4 weeks.    Current Anticoagulation Instructions: INR 1.7  Take 1 tab each Monday and Thursday and 0.5 tab on all other days.

## 2010-11-07 NOTE — Medication Information (Signed)
Summary: rov/tm  Anticoagulant Therapy  Managed by: Cloyde Reams, RN, BSN PCP: Gwen Pounds MD: Riley Kill MD, Maisie Fus Indication 1: Pulmonary Embolism and Infarction (ICD-415.1) Lab Used[facility]  Site: [facility] INR POC 3.5 INR RANGE 2 - 3  Dietary changes: no    Health status changes: no    Bleeding/hemorrhagic complications: no    Recent/future hospitalizations: no    Any changes in medication regimen? no    Recent/future dental: no  Any missed doses?: no       Is patient compliant with meds? yes       Allergies: 1)  ! Sulfa 2)  ! * Evista 3)  Augmentin 4)  * Omnaris  Anticoagulation Management History:      The patient is taking warfarin and comes in today for a routine follow up visit.  Positive risk factors for bleeding include an age of 75 years or older and history of GI bleeding.  The bleeding index is 'intermediate risk'.  Positive CHADS2 values include History of HTN and Age > 83 years old.  The start date was 06/19/2007.  Her last INR was 1.9 ratio.  Anticoagulation responsible provider: Riley Kill MD, Maisie Fus.  INR POC: 3.5.  Cuvette Lot#: [id number].  Exp: 02/2011.    Anticoagulation Management Assessment/Plan:      The patient's current anticoagulation dose is Warfarin sodium 5 mg  tabs: take as directed - currently 2.5 mg except 5 mg on thurs.  The target INR is 2 - 3.  The next INR is due 02/01/2010.  Anticoagulation instructions were given to patient.  Results were reviewed/authorized by Cloyde Reams, RN, BSN.  She was notified by Cloyde Reams RN.         Prior Anticoagulation Instructions: INR 2.7 Continue 2.5mg s everyday except 5mg s on Mondays and Thursdays. Recheck in 4 weeks.   Current Anticoagulation Instructions: INR 3.5  Skip tomorrow's dosage of coumadin, then resume same dosage 1/2 tablet daily except 1 tablet on Mondays and Thursdays.  Recheck in 3 weeks.

## 2010-11-07 NOTE — Medication Information (Signed)
      Allergies Added:  Anticoagulant Therapy  Managed by: Reina Fuse, PharmD PCP: Gwen Pounds MD: Eden Emms MD, Theron Arista Indication 1: Pulmonary Embolism and Infarction (ICD-415.1) Lab Used: [facility] Site: [facility] INR POC 1.6 INR RANGE 2 - 3  Dietary changes: yes       Details: More brussel sprouts than normal  ~ 3 servings this week.   Health status changes: no    Bleeding/hemorrhagic complications: no    Recent/future hospitalizations: no    Any changes in medication regimen? yes       Details: On Day 3 of Zpak  Recent/future dental: no  Any missed doses?: yes     Details: Maybe missed yesterday's dose. Has not taken today.    Current Medications (verified): 1)  Ecotrin Low Strength 81 Mg  Tbec (Aspirin) .[name].. 1 By Mouth Daily 2)  Alendronate Sodium 70 Mg  Tabs (Alendronate Sodium) .[name].. 1 By Mouth Each Thursday. 3)  Omeprazole 20 Mg Cpdr (Omeprazole) .[name].. 1 By Mouth At Bedtime 4)  Warfarin Sodium 5 Mg  Tabs (Warfarin Sodium) .... Take As Directed - Currently 2.5 Mg Except 5 Mg On Thurs 5)  Losartan Potassium 100 Mg Tabs (Losartan Potassium) .[name].. 1 By Mouth Once Daily 6)  Proventil Hfa 108 (90 Base) Mcg/act Aers (Albuterol Sulfate) .... Up To 2 Puffs Every 4 Hours As Needed (Rescue Inhaler) 7)  Clariitin 10 Mg 8)  Nasonex 50 Mcg/act Susp (Mometasone Furoate) .... 2 Spray/side Once Daily 9)  Amlodipine Besylate 2.5 Mg Tabs (Amlodipine Besylate) .[name].. 1po Once Daily 10)  Welchol 3.75 Gm Pack (Colesevelam Hcl) .[name].. 1 Pkt By Mouth Once Daily 11)  Proair Hfa 108 (90 Base) Mcg/act Aers (Albuterol Sulfate) .... 2 Puffs Every 4 To 6 Hours As Needed  Allergies (verified): 1)  ! Sulfa 2)  ! * Evista 3)  Augmentin 4)  * Omnaris  Anticoagulation Management History:      Positive risk factors for bleeding include an age of 83 years or older and history of GI bleeding.  The bleeding index is 'intermediate risk'.  Positive CHADS2 values include History of HTN and Age > 62  years old.  The start date was 06/19/2007.  Her last INR was 1.9 ratio.  Anticoagulation responsible provider: Eden Emms MD, Theron Arista.  INR POC: 1.6.  Exp: 05/2011.    Anticoagulation Management Assessment/Plan:      The patient's current anticoagulation dose is Warfarin sodium 5 mg  tabs: take as directed - currently 2.5 mg except 5 mg on thurs.  The target INR is 2 - 3.  The next INR is due 05/24/2010.  Anticoagulation instructions were given to patient.  Results were reviewed/authorized by Reina Fuse, PharmD.  She was notified by Reina Fuse PharmD.         Prior Anticoagulation Instructions: INR 2.7  Continue on same dosage 1/2 tablet daily except 1 tablet on Mondays and Thursdays.  Recheck in 4 weeks.   Current Anticoagulation Instructions: INR 1.6   Take Coumadin 1.5 tablets (7.5 mg) today Wed, August 3rd. Take Coumadin 1.5 tablet (7.5 mg) tomorrow Clovis Cao, August 4th.   Then continue Coumadin 0.5 tab (2.5 mg) on Sun, Tues, Wed, Fri, Sat and Coumadin 1 tab (5 mg) on Mon, Thur. Return to clinic in 2 weeks.

## 2010-11-07 NOTE — Medication Information (Signed)
Summary: rov/ewj  Anticoagulant Therapy  Managed by: Cloyde Reams, RN, BSN PCP: Gwen Pounds MD: Gala Romney MD, Reuel Boom Indication 1: Pulmonary Embolism and Infarction (ICD-415.1) Lab Used: [facility] Site: [facility] INR POC 2.6 INR RANGE 2 - 3  Dietary changes: no    Health status changes: no    Bleeding/hemorrhagic complications: no    Recent/future hospitalizations: no    Any changes in medication regimen? no    Recent/future dental: no  Any missed doses?: no       Is patient compliant with meds? yes       Allergies: 1)  ! Sulfa 2)  ! * Evista 3)  Augmentin 4)  * Omnaris  Anticoagulation Management History:      The patient is taking warfarin and comes in today for a routine follow up visit.  Positive risk factors for bleeding include an age of 75 years or older and history of GI bleeding.  The bleeding index is 'intermediate risk'.  Positive CHADS2 values include History of HTN and Age > 75 years old.  The start date was 06/19/2007.  Her last INR was 1.9 ratio.  Anticoagulation responsible provider: Hiilei Gerst MD, Reuel Boom.  INR POC: 2.6.  Cuvette Lot#: [id number].  Exp: 03/2011.    Anticoagulation Management Assessment/Plan:      The patient's current anticoagulation dose is Warfarin sodium 5 mg  tabs: take as directed - currently 2.5 mg except 5 mg on thurs.  The target INR is 2 - 3.  The next INR is due 03/01/2010.  Anticoagulation instructions were given to patient.  Results were reviewed/authorized by Cloyde Reams, RN, BSN.  She was notified by Cloyde Reams RN.         Prior Anticoagulation Instructions: INR 3.5  Skip tomorrow's dosage of coumadin, then resume same dosage 1/2 tablet daily except 1 tablet on Mondays and Thursdays.  Recheck in 3 weeks.    Current Anticoagulation Instructions: INR 2.6  Continue on same dosage 1/2 tablet daily except 1 tablet on Mondays and Thursdays. Recheck in 4 weeks.

## 2010-11-07 NOTE — Medication Information (Signed)
Summary: rov/ewj   Anticoagulant Therapy  Managed by: Weston Brass, PharmD PCP: Oliver Barre, MD Supervising MD: Myrtis Ser MD, Tinnie Gens Indication 1: Pulmonary Embolism and Infarction (ICD-415.1) Lab Used: [facility] Site: [facility] INR POC 3.8 INR RANGE 2 - 3  Dietary changes: no    Health status changes: no    Bleeding/hemorrhagic complications: yes       Details: noticed a lot of bleeding around areas of shots, and areas on legs/arms have sporatically started bleeding.   Recent/future hospitalizations: no    Any changes in medication regimen? yes       Details: was on antibiotic for 3 days (possibly avelox, not sure)  Recent/future dental: no  Any missed doses?: no       Is patient compliant with meds? yes       Allergies: 1)  ! Sulfa 2)  ! * Evista 3)  ! * Scallops 4)  Augmentin 5)  * Omnaris  Anticoagulation Management History:      The patient is taking warfarin and comes in today for a routine follow up visit.  Positive risk factors for bleeding include an age of 19 years or older and history of GI bleeding.  The bleeding index is 'intermediate risk'.  Positive CHADS2 values include History of HTN and Age > 70 years old.  The start date was 06/19/2007.  Her last INR was 2.4 ratio.  Anticoagulation responsible provider: Myrtis Ser MD, Tinnie Gens.  INR POC: 3.8.  Cuvette Lot#: [id number].  Exp: 08/2011.    Anticoagulation Management Assessment/Plan:      The patient's current anticoagulation dose is Warfarin sodium 5 mg  tabs: take as directed - currently 2.5 mg except 5 mg on thurs.  The target INR is 2 - 3.  The next INR is due 08/04/2010.  Anticoagulation instructions were given to patient.  Results were reviewed/authorized by Weston Brass, PharmD.  She was notified by Ilean Skill D candidate.         Prior Anticoagulation Instructions: INR 3.9  Skip tomorrow's dosage of Coumadin, then take 1/2 tablet on Thursdays, then resume same dosage 1/2 tablet daily except 1 tablet on  Mondays and Thursdays.  Recheck in 2-3 weeks.    Current Anticoagulation Instructions: INR 3.8  Skip today's dose. Then take 1/2 tablet everyday except 1 tablet on Monday and Thursday. Recheck 10 days.

## 2010-11-07 NOTE — Medication Information (Signed)
Summary: [name]  Anticoagulant Therapy  Managed by: Elaina Pattee, PharmD PCP: Gwen Pounds MD: Excell Seltzer MD, Casimiro Needle Indication 1: Pulmonary Embolism and Infarction (ICD-415.1) Lab Used: [facility] Site: [facility] INR POC 2.5 INR RANGE 2 - 3  Dietary changes: no    Health status changes: no    Bleeding/hemorrhagic complications: no    Recent/future hospitalizations: yes       Details: Has had some rectal bleeding, but unsure of procedure that will be done to follow-up.  Will notify us.  Any changes in medication regimen? no    Recent/future dental: no  Any missed doses?: no       Is patient compliant with meds? yes       Allergies: 1)  ! Sulfa 2)  ! * Evista 3)  Augmentin 4)  * Omnaris  Anticoagulation Management History:      The patient is taking warfarin and comes in today for a routine follow up visit.  Positive risk factors for bleeding include an age of 75 years or older and history of GI bleeding.  The bleeding index is 'intermediate risk'.  Positive CHADS2 values include History of HTN and Age > 57 years old.  The start date was 06/19/2007.  Her last INR was 1.9 ratio.  Anticoagulation responsible provider: Excell Seltzer MD, Casimiro Needle.  INR POC: 2.5.  Cuvette Lot#: [id number].  Exp: 05/2011.    Anticoagulation Management Assessment/Plan:      The patient's current anticoagulation dose is Warfarin sodium 5 mg  tabs: take as directed - currently 2.5 mg except 5 mg on thurs.  The target INR is 2 - 3.  The next INR is due 04/07/2010.  Anticoagulation instructions were given to patient.  Results were reviewed/authorized by Elaina Pattee, PharmD.  She was notified by Elaina Pattee, PharmD.         Prior Anticoagulation Instructions: INR 2.6  Continue on same dosage 1/2 tablet daily except 1 tablet on Mondays and Thursdays. Recheck in 4 weeks.    Current Anticoagulation Instructions: INR 2.5. Take 0.5 tablet daily except 1 tablet on Mon and Thurs. Recheck in 4 weeks.

## 2010-11-07 NOTE — Procedures (Signed)
Summary: Colonoscopy    Colonoscopy  Procedure date:  01/13/2004  Findings:      Results: Hemorrhoids.     Results: Diverticulosis.       Pathology:  Hyperplastic polyp.     Location:  [facility].   Patient Name: [name], [name] MRN: [id number] Procedure Procedures: Colonoscopy CPT: 27062.    with Hot Biopsy(s)CPT: Z451292.  Personnel: Endoscopist: Venita Lick. Russella Dar, MD, Clementeen Graham.  Exam Location: Exam performed in Outpatient Clinic. Outpatient  Patient Consent: Procedure, Alternatives, Risks and Benefits discussed, consent obtained, from patient. Consent was obtained by the RN.  Indications Symptoms: Hematochezia.  History  Current Medications: Patient is not currently taking Coumadin.  Pre-Exam Physical: Performed Jan 13, 2004. Entire physical exam was normal.  Exam Exam: Extent of exam reached: Cecum, extent intended: Cecum.  The cecum was identified by appendiceal orifice and IC valve. Colon retroflexion performed. ASA Classification: II. Tolerance: excellent.  Monitoring: Pulse and BP monitoring, Oximetry used. Supplemental O2 given.  Colon Prep Used Miralax for colon prep. Prep results: good.  Sedation Meds: Patient assessed and found to be appropriate for moderate (conscious) sedation. Fentanyl 75 mcg. given IV. Versed 7 mg. given IV.  Findings - DIVERTICULOSIS: Sigmoid Colon. Not bleeding. ICD9: Diverticulosis: 562.10.  NORMAL EXAM: Cecum to Descending Colon.  POLYP: Sigmoid Colon, Maximum size: 3 mm. sessile polyp. Procedure:  hot biopsy, removed, retrieved, Polyp sent to pathology. ICD9: Colon Polyps: 211.3.  HEMORRHOIDS: Internal. Size: Small. Not bleeding. Not thrombosed. ICD9: Hemorrhoids, Internal: 455.0.   Assessment  Diagnoses: 562.10: Diverticulosis.  455.0: Hemorrhoids, Internal.  211.3: Colon Polyps.   Events  Unplanned Interventions: No intervention was required.  Unplanned Events: There were no  complications. Plans  Post Exam Instructions: No aspirin or non-steroidal containing medications: 2 weeks.  Medication Plan: Await pathology. Continue current medications.  Patient Education: Patient given standard instructions for: Polyps. Diverticulosis. Hemorrhoids.  Disposition: After procedure patient sent to recovery. After recovery patient sent home.  Scheduling/Referral: Colonoscopy, to [facility] T. Russella Dar, MD, [facility], around Jan 13, 2011.    Patient Name: [name], [name] MRN: [id number] Addendum 1 - Jan 13, 2004 0 Plans DELETED<<>>DELETED Scheduling/Referral: DELETED<<Colonoscopy, to [employer]. Russella Dar, MD, [facility], around Jan 13, 2011.  >>DELETED Scheduling/Referral: Colonoscopy, to [facility] T. Russella Dar, MD, [facility], if polyp(s) adenomatous, around Jan 12, 2009.    [Fellow] This report was created from the original endoscopy report, which was reviewed and signed by the above listed endoscopist.

## 2010-11-07 NOTE — Procedures (Signed)
Summary: ERCP   ERCP  Procedure date:  01/26/2003  Findings:      abnormal:  Location: [facility].  Patient Name: [name], [name] MRN: [id number] Procedure Procedures: Diagnostic Endoscopic Retrograde Cholangiopancreatography CPT: 43260.  Personnel: Endoscopist: Venita Lick. Russella Dar, MD, Clementeen Graham.  Exam Location: Exam performed in Radiology. Outpatient  Patient Consent: Procedure, Alternatives, Risks and Benefits discussed, consent obtained, from patient.  Indications  Abnormal Exams, Studies: MRI, .  Symptoms: Abdominal pain.  Comments: 3.5 X 1.6 cm cystic lesion in the head of the pancreas adjacent to the CBD and PD History  Pre-Exam Physical: Performed Jan 26, 2003. Entire physical exam was normal.  Exam Exam: ASA Classification: II. Tolerance: good.  Monitoring: Pulse and BP monitoring done. Oximetry used. Supplemental O2 given.  Sedation Meds: Patient assessed and found to be appropriate for moderate (conscious) sedation. Fentanyl 100 mcg. given IV. Versed 8.5 mg. given IV. Glucagon 0.25 mg. given IV. Cetacaine Spray 2 sprays given aerosolized.  Fluoroscopy: Fluoroscopy was used.  VISUALIZATION Major Papilla visualized. Minor Papilla not sought.  Cannulation: The preferred duct was cannulated.   Ducts: Common Bile Duct, Left Hepatic Duct, Right Hepatic Duct visualized. Intrahepatic DuctsPancreatic Duct visualized.  Findings Normal : Right Hepatic Duct.  Normal : Left Hepatic Duct.  - PRIOR SURGERY: Cystic Duct. Cholecystectomy.  DUCTAL DILATION: Common Bile Duct to Common Hepatic Duct,  diffuse. ICD9: . Comments: mildly dilated CBD.  Normal : Pancreatic Duct.  Normal : Major Duodenal Papilla.    Comments: mild abrasion in the [facility] likely scope induced noted on withdrawl Assessment  Biliary Tract: Exam complete. Abnormal examination, see findings above.  Pancreatic Tract: Exam complete. Normal examination.  Ampulla: Exam complete.  Normal  examination.  Comments: Mildly dilated bile duct post cholecystectomy Events  Unplanned Intervention: No intervention was required.  Unplanned Events: There were no complications. Plans Medication Plan: Continue current medications.  Disposition: After procedure patient sent to recovery. After recovery patient sent home.  Scheduling: Office Visit, to [employer]. Russella Dar, MD, [facility], EUS possible biopsy, around Feb 09, 2003.  Primary Care Provider, to Corwin Levins, MD,    This report was created from the original endoscopy report, which was reviewed and signed by the above listed endoscopist.    cc: Corwin Levins, MD

## 2010-11-07 NOTE — Miscellaneous (Signed)
Summary: rx  Clinical Lists Changes  Medications: Added new medication of CIPROFLOXACIN HCL 500 MG  TABS (CIPROFLOXACIN HCL) Take 1 twice a day for 3 days - Signed Rx of CIPROFLOXACIN HCL 500 MG  TABS (CIPROFLOXACIN HCL) Take 1 twice a day for 3 days;  #6 x 0;  Signed;  Entered by: Rachael Fee MD;  Authorized by: Rachael Fee MD;  Method used: Print then Give to Patient    Prescriptions: CIPROFLOXACIN HCL 500 MG  TABS (CIPROFLOXACIN HCL) Take 1 twice a day for 3 days  #6 x 0   Entered and Authorized by:   Rachael Fee MD   Signed by:   Rachael Fee MD on 07/20/2010   Method used:   Print then Give to Patient   RxID:   [id number]

## 2010-11-07 NOTE — Medication Information (Signed)
Summary: [email or web address].mp  Anticoagulant Therapy  Managed by: Shelby Dubin, PharmD, BCPS, CPP PCP: Gwen Pounds MD: Antoine Poche MD, Fayrene Fearing Indication 1: Pulmonary Embolism and Infarction (ICD-415.1) Lab Used: [facility] Site: [facility] INR POC 1.5 INR RANGE 2 - 3  Dietary changes: no    Health status changes: no    Bleeding/hemorrhagic complications: no    Recent/future hospitalizations: no    Any changes in medication regimen? no    Recent/future dental: no  Any missed doses?: yes     Details: May have forgotten to take extra 1/2 tablet on Mondays and Thursdays  Is patient compliant with meds? yes       Allergies (verified): 1)  ! Sulfa 2)  ! * Evista 3)  Augmentin 4)  * Omnaris  Anticoagulation Management History:      The patient is taking warfarin and comes in today for a routine follow up visit.  Positive risk factors for bleeding include an age of 47 years or older and history of GI bleeding.  The bleeding index is 'intermediate risk'.  Positive CHADS2 values include History of HTN and Age > 25 years old.  The start date was 06/19/2007.  Her last INR was 1.9 ratio.  Anticoagulation responsible provider: Antoine Poche MD, Fayrene Fearing.  INR POC: 1.5.  Cuvette Lot#: [id number].  Exp: 11/2010.    Anticoagulation Management Assessment/Plan:      The patient's current anticoagulation dose is Warfarin sodium 5 mg  tabs: take as directed - currently 2.5 mg except 5 mg on thurs.  The target INR is 2 - 3.  The next INR is due 11/14/2009.  Anticoagulation instructions were given to patient.  Results were reviewed/authorized by Shelby Dubin, PharmD, BCPS, CPP.  She was notified by Ysidro Evert, Pharm D Candidate.         Prior Anticoagulation Instructions: INR 1.7  Take 1 tab each Monday and Thursday and 0.5 tab on all other days.      Current Anticoagulation Instructions: INR: 1.5 Take 1 ane 1/2 tablets today and tomorrow, then resume to same dosage of 1/2 tablet daily except 1 tablet on  Mondays and Thursdays Recheck in 2 weeks

## 2010-11-07 NOTE — Medication Information (Signed)
Summary: Prolia Ins Verification Request/ProliaPlus  Prolia Ins Verification Request/ProliaPlus   Imported By: Sherian Rein 02/08/2010 10:38:25  _____________________________________________________________________  External Attachment:    Type:   Image     Comment:   External Document

## 2010-11-07 NOTE — Letter (Signed)
Summary: Anticoagulation Modification Letter  [facility]  [address] [place], [place] [address]   Phone: [phone]  Fax: [phone]    July 04, 2010  Re:    [name] [facility] DOB:    06/29/1930 MRN:    [id number]    Dear Dr Oliver Barre,  We have scheduled the above patient for an endoscopic procedure. Our records show that  he/she is on anticoagulation therapy. Please advise as to how long the patient may come off their therapy of coumadin prior to the scheduled procedure.   Please fax back/or route the completed form to Patty at [phone].  Thank you for your help with this matter.  Sincerely,  Chales Abrahams CMA Duncan Dull)   Physician Recommendation:  Hold Plavix 7 days prior ________________  Hold Coumadin 5 days prior ____________  Other ______________________________     Appended Document: Anticoagulation Modification Letter routed to Dr Jonny Ruiz  Appended Document: Anticoagulation Modification Letter Also sent to [facility]  Appended Document: Anticoagulation Modification Letter see 07/06/10 phone note

## 2010-11-07 NOTE — Progress Notes (Signed)
Summary: Prolia  Phone Note Other Incoming   Summary of Call: Patient is covered for patient, $0 out of pocket expense.  *Will try to call patient again later, could not reach due to phone system.  Initial call taken by: Lucious Groves,  January 20, 2010 10:06 AM  Follow-up for Phone Call        noted Follow-up by: Corwin Levins MD,  January 24, 2010 1:03 PM  Additional Follow-up for Phone Call Additional follow up Details #1::        left message on machine for pt to return my call. Margaret Pyle, CMA  January 25, 2010 2:28 PM  left message on machine for pt to return my call. Margaret Pyle, CMA  January 26, 2010 8:20 AM     Additional Follow-up for Phone Call Additional follow up Details #2::    left message on machine for pt to return my call, closing phone note until further contact from pt Follow-up by: Margaret Pyle, CMA,  January 30, 2010 9:22 AM   Appended Document: Prolia pt informed and states she will think about Prolia and call us back if she decides to try it.

## 2010-11-07 NOTE — Letter (Signed)
Summary: CMN for Oxygen/HCS  CMN for Oxygen/HCS   Imported By: Sherian Rein 10/20/2009 14:42:32  _____________________________________________________________________  External Attachment:    Type:   Image     Comment:   External Document

## 2010-11-07 NOTE — Consult Note (Signed)
Summary: [facility]  [facility]   Imported By: Sherian Rein 11/07/2009 07:57:14  _____________________________________________________________________  External Attachment:    Type:   Image     Comment:   External Document

## 2010-11-07 NOTE — Procedures (Signed)
Summary: [facility]   Imported By: Sherian Rein 07/04/2010 07:14:05  _____________________________________________________________________  External Attachment:    Type:   Image     Comment:   External Document

## 2010-11-07 NOTE — Procedures (Signed)
[facility]   [facility]   Imported By: Sherian Rein 07/04/2010 07:15:59  _____________________________________________________________________  External Attachment:    Type:   Image     Comment:   External Document

## 2010-11-07 NOTE — Assessment & Plan Note (Signed)
Summary: STOMACH PAIN---STC   Vital Signs:  Patient profile:   77 year old female Height:      65 inches Weight:      162.50 pounds BMI:     27.14 O2 Sat:      94 % on Room air Temp:     98.2 degrees F oral Pulse rate:   69 / minute BP sitting:   148 / 64  (left arm) Cuff size:   regular  Vitals Entered By: Zella Ball Ewing CMA Duncan Dull) (May 24, 2010 1:47 PM)  O2 Flow:  Room air CC: Stomach pain/RE   Primary Care Provider:  Jonny Ruiz  CC:  Stomach pain/RE.  History of Present Illness: her to f/u iwht persistent abd pain, has burning sensation to the mid abd, tender to touch, cant stand to wear tight clothes, frustrated and "somthing just has to be done."  Also with small ot mod amounts BRB with BM's, on chronic coumadin.  Last colonsocpy approx 1 yr.  Has ongoing nausea, but feels she cant take the PPI's as it seems to make her warfarin harder to regulate - INR seems to go up with the PPI's.. Zantac OTC two times a day does not seem to affet the coumaidn and still with the nausea despite the good complaince.  Has diverticulsis and wonders if that could be a problem.. Lost about 5 lbs in the past wk with recued appetite, and just after eating pain is worse.  Sometimes with loose stool,  no vomiting overall. No pain radiating to the back..  No fever;  No urinary symptoms worsening such as burning, freq, urgency, but at times will hvae more nocturia,  but no hematuria.  No other overt bleeding on the coumadin besides bruises.  Last CT scan approx 2 -3 yrs for the pancreatic cyst - had cyst drained at [facility].   also s/p ccx .  Main problem is more naiusea than pain overall, and general weakness and fatigue.  Also had some minor dysphagia to solid but easily helped with by mouth fluids  Preventive Screening-Counseling & Management      Drug Use:  no.    Problems Prior to Update: 1)  Preventive Health Care  (ICD-V70.0) 2)  Frequency, Urinary  (ICD-788.41) 3)  Dizziness, Chronic  (ICD-780.4) 4)   Hematochezia  (ICD-578.1) 5)  Overactive Bladder  (ICD-596.51) 6)  Colonic Polyps, Hx of  (ICD-V12.72) 7)  Skin Lesion  (ICD-709.9) 8)  Facial Pain  (ICD-784.0) 9)  Coronary Artery Disease  (ICD-414.00) 10)  Frequency, Urinary  (ICD-788.41) 11)  Cellulitis, Leg, Left  (ICD-682.6) 12)  Headache  (ICD-784.0) 13)  Facial Pain  (ICD-784.0) 14)  Chronic Rhinitis  (ICD-472.0) 15)  Sinusitis- Acute-nos  (ICD-461.9) 16)  Fatigue  (ICD-780.79) 17)  Uri  (ICD-465.9) 18)  Dysuria  (ICD-788.1) 19)  Sinusitis- Acute-nos  (ICD-461.9) 20)  Hematochezia  (ICD-578.1) 21)  Viral Infection  (ICD-079.99) 22)  COPD  (ICD-496) 23)  Pulmonary Hypertension, Secondary  (ICD-416.8) 24)  Dyspnea  (ICD-786.09) 25)  Headache  (ICD-784.0) 26)  Gait Imbalance  (ICD-781.2) 27)  Unspecified Hearing Loss  (ICD-389.9) 28)  Sinusitis- Acute-nos  (ICD-461.9) 29)  Osteoarthritis, Knee, Right  (ICD-715.96) 30)  Carotid Artery Stenosis, Right  (ICD-433.10) 31)  Diverticulosis, Colon  (ICD-562.10) 32)  Irritable Bowel Syndrome, Hx of  (ICD-V12.79) 33)  Common Migraine  (ICD-346.10) 34)  Depression  (ICD-311) 35)  Cyst/pseudocyst, Pancreas  (ICD-577.2) 36)  Constipation, Chronic, Hx of  (ICD-V12.79) 37)  Hyperlipidemia  (  ICD-272.4) 38)  Allergic Rhinitis  (ICD-477.9) 39)  Pulmonary Embolism, Hx of  (ICD-V12.51) 40)  Anxiety  (ICD-300.00) 41)  Transient Ischemic Attack, Hx of  (ICD-V12.50) 42)  Osteoporosis  (ICD-733.00) 43)  Hypertension  (ICD-401.9) 44)  Gerd  (ICD-530.81)  Medications Prior to Update: 1)  Ecotrin Low Strength 81 Mg  Tbec (Aspirin) .[name].. 1 By Mouth Daily 2)  Alendronate Sodium 70 Mg  Tabs (Alendronate Sodium) .[name].. 1 By Mouth Each Thursday. 3)  Omeprazole 20 Mg Cpdr (Omeprazole) .[name].. 1 By Mouth At Bedtime 4)  Warfarin Sodium 5 Mg  Tabs (Warfarin Sodium) .... Take As Directed - Currently 2.5 Mg Except 5 Mg On Thurs 5)  Losartan Potassium 100 Mg Tabs (Losartan Potassium) .[name].. 1 By Mouth Once  Daily 6)  Clariitin 10 Mg 7)  Nasonex 50 Mcg/act Susp (Mometasone Furoate) .... 2 Spray/side Once Daily 8)  Amlodipine Besylate 2.5 Mg Tabs (Amlodipine Besylate) .[name].. 1po Once Daily 9)  Proair Hfa 108 (90 Base) Mcg/act Aers (Albuterol Sulfate) .... 2 Puffs Every 4 To 6 Hours As Needed  Current Medications (verified): 1)  Ecotrin Low Strength 81 Mg  Tbec (Aspirin) .[name].. 1 By Mouth Daily 2)  Alendronate Sodium 70 Mg  Tabs (Alendronate Sodium) .[name].. 1 By Mouth Each Thursday. 3)  Omeprazole 20 Mg Cpdr (Omeprazole) .[name].. 1 By Mouth At Bedtime 4)  Warfarin Sodium 5 Mg  Tabs (Warfarin Sodium) .... Take As Directed - Currently 2.5 Mg Except 5 Mg On Thurs 5)  Losartan Potassium 100 Mg Tabs (Losartan Potassium) .[name].. 1 By Mouth Once Daily 6)  Clariitin 10 Mg 7)  Nasonex 50 Mcg/act Susp (Mometasone Furoate) .... 2 Spray/side Once Daily 8)  Amlodipine Besylate 2.5 Mg Tabs (Amlodipine Besylate) .[name].. 1po Once Daily 9)  Proair Hfa 108 (90 Base) Mcg/act Aers (Albuterol Sulfate) .... 2 Puffs Every 4 To 6 Hours As Needed 10)  Promethazine Hcl 25 Mg Tabs (Promethazine Hcl) .[name].. 1 By Mouth Q 6 Hrs As Needed Nausea  Allergies (verified): 1)  ! Sulfa 2)  ! * Evista 3)  Augmentin 4)  * Omnaris  Past History:  Past Medical History: Last updated: 12/08/2009 PULMONARY HYPERTENSION, SECONDARY (ICD-416.8) DYSPNEA (ICD-786.09) HEADACHE (ICD-784.0) GAIT IMBALANCE (ICD-781.2) UNSPECIFIED HEARING LOSS (ICD-389.9) SINUSITIS- ACUTE-NOS (ICD-461.9) OSTEOARTHRITIS, KNEE, RIGHT (ICD-715.96) CAROTID ARTERY STENOSIS, RIGHT (ICD-433.10) DIVERTICULOSIS, COLON (ICD-562.10) IRRITABLE BOWEL SYNDROME, HX OF (ICD-V12.79) COMMON MIGRAINE (ICD-346.10) DEPRESSION (ICD-311) CYST/PSEUDOCYST, PANCREAS (ICD-577.2) CONSTIPATION, CHRONIC, HX OF (ICD-V12.79) HYPERLIPIDEMIA (ICD-272.4) ALLERGIC RHINITIS (ICD-477.9) PULMONARY EMBOLISM, HX OF (ICD-V12.51) ANXIETY (ICD-300.00) TRANSIENT ISCHEMIC ATTACK, HX OF  (ICD-V12.50) OSTEOPOROSIS (ICD-733.00) HYPERTENSION (ICD-401.9) GERD (ICD-530.81) Allergic rhinitis Hyperlipidemia Coronary artery disease- by coronary ca+2 on CT chest 07/2008 Colonic polyps, hx of OAB  Past Surgical History: Last updated: 08/05/2007 Hysterectomy egd 1999 Cholecystectomy Tonsillectomy Appendectomy s/p sinus surgury  Social History: Last updated: 12/08/2009 Never Smoked Alcohol use-no Drug use-no Retired  Risk Factors: Smoking Status: never (10/17/2007)  Review of Systems       all otherwise negative per pt -    Physical Exam  General:  alert and overweight-appearing.   Head:  normocephalic and atraumatic.   Eyes:  vision grossly intact, pupils equal, and pupils round.   Ears:  R ear normal and L ear normal.   Nose:  no external deformity and no nasal discharge.   Mouth:  no gingival abnormalities and pharynx pink and moist.   Neck:  supple and no masses.   Lungs:  normal respiratory effort and normal breath sounds.   Heart:  normal rate and regular rhythm.   Abdomen:  soft and normal bowel sounds.  with mild diffuse mild tender somewhat worse mid and right abd without  Extremities:  no edema, no erythema    Impression & Recommendations:  Problem # 1:  ABDOMINAL PAIN, GENERALIZED (ICD-789.07)  with nausea and general weakness, recent hematochezia -  for routine labs, as well as CT given the pancreatic hx;  consider GI referral for EGD if not improved  Orders: TLB-BMP (Basic Metabolic Panel-BMET) (80048-METABOL) TLB-CBC Platelet - w/Differential (85025-CBCD) TLB-Hepatic/Liver Function Pnl (80076-HEPATIC) TLB-Lipase (83690-LIPASE) TLB-Udip w/ Micro (81001-URINE) T-Culture, Urine ([id number]) TLB-H. Pylori Abs(Helicobacter Pylori) (86677-HELICO) TLB-Amylase (82150-AMYL) TLB-Sedimentation Rate (ESR) (85652-ESR) Radiology Referral (Radiology)  Problem # 2:  HEMATOCHEZIA (ICD-578.1)  as above, recent INR management reviewed, cont same  for now except to check INR given the bleeding  Orders: TLB-PT (Protime) (85610-PTP)  Problem # 3:  HYPERTENSION (ICD-401.9)  Her updated medication list for this problem includes:    Losartan Potassium 100 Mg Tabs (Losartan potassium) .[name]... 1 by mouth once daily    Amlodipine Besylate 2.5 Mg Tabs (Amlodipine besylate) .[name]... 1po once daily  BP today: 148/64 Prior BP: 144/80 (05/18/2010)  Labs Reviewed: K+: 4.5 (04/06/2009) Creat: : 0.5 (04/06/2009)   Chol: 230 (04/06/2009)   HDL: 63.70 (04/06/2009)   LDL: DEL (07/15/2008)   TG: 113.0 (04/06/2009) mild elev today, likely situational, ok to follow, continue same treatment   Problem # 4:  GERD (ICD-530.81)  Her updated medication list for this problem includes:    Zantac 150 Maximum Strength 150 Mg Tabs (Ranitidine hcl) .[name]... 1po two times a day treat as above, f/u any worsening signs or symptoms , also for TUMS as needed   Complete Medication List: 1)  Ecotrin Low Strength 81 Mg Tbec (Aspirin) .[name].. 1 by mouth daily 2)  Alendronate Sodium 70 Mg Tabs (Alendronate sodium) .[name].. 1 by mouth each thursday. 3)  Zantac 150 Maximum Strength 150 Mg Tabs (Ranitidine hcl) .[name].. 1po two times a day 4)  Warfarin Sodium 5 Mg Tabs (Warfarin sodium) .... Take as directed - currently 2.5 mg except 5 mg on thurs 5)  Losartan Potassium 100 Mg Tabs (Losartan potassium) .[name].. 1 by mouth once daily 6)  Clariitin 10 Mg  7)  Nasonex 50 Mcg/act Susp (Mometasone furoate) .... 2 spray/side once daily 8)  Amlodipine Besylate 2.5 Mg Tabs (Amlodipine besylate) .[name].. 1po once daily 9)  Proair Hfa 108 (90 Base) Mcg/act Aers (Albuterol sulfate) .... 2 puffs every 4 to 6 hours as needed 10)  Promethazine Hcl 25 Mg Tabs (Promethazine hcl) .[name].. 1 by mouth q 6 hrs as needed nausea 11)  Tums Prn    Patient Instructions: 1)  Please take all new medications as prescribed - the nauea medication 2)  Continue all previous medications as before this visit , including the  zantac for now 3)  You can also take TUMS as needed for discomfort and nausea as needed  4)  Please go to the Lab in the basement for your blood and/or urine tests today 5)  Please call the number on the [facility] Card for results of your testing 6)  You will be contacted about the referral(s) to: CT scan 7)  If your symptoms persist and testing is negative, you may need to be referred to GI to consider EGD (endoscopy) 8)  Please schedule a follow-up appointment in march 2012 for your "yearly medicare exam" , or sooner if  needed Prescriptions: PROMETHAZINE HCL 25  MG TABS (PROMETHAZINE HCL) 1 by mouth q 6 hrs as needed nausea  #40 x 1   Entered and Authorized by:   Corwin Levins MD   Signed by:   Corwin Levins MD on 05/24/2010   Method used:   Print then Give to Patient   RxID:   [id number]

## 2010-11-07 NOTE — Medication Information (Signed)
Summary: rov/sel  Anticoagulant Therapy  Managed by: Bethena Midget, RN, BSN PCP: Oliver Barre, MD Supervising MD: Excell Seltzer MD, Casimiro Needle Indication 1: Pulmonary Embolism and Infarction (ICD-415.1) Lab Used: [facility] Site: [facility] INR POC 2.6 INR RANGE 2 - 3  Dietary changes: no    Health status changes: no    Bleeding/hemorrhagic complications: no    Recent/future hospitalizations: no    Any changes in medication regimen? no    Recent/future dental: no  Any missed doses?: no       Is patient compliant with meds? yes       Allergies: 1)  ! Sulfa 2)  ! * Evista 3)  ! * Scallops 4)  Augmentin 5)  * Omnaris  Anticoagulation Management History:      The patient is taking warfarin and comes in today for a routine follow up visit.  Positive risk factors for bleeding include an age of 75 years or older and history of GI bleeding.  The bleeding index is 'intermediate risk'.  Positive CHADS2 values include History of HTN and Age > 1 years old.  The start date was 06/19/2007.  Her last INR was 2.4 ratio.  Anticoagulation responsible provider: Excell Seltzer MD, Casimiro Needle.  INR POC: 2.6.  Cuvette Lot#: [id number].  Exp: 08/2011.    Anticoagulation Management Assessment/Plan:      The patient's current anticoagulation dose is Warfarin sodium 5 mg  tabs: take as directed - currently 2.5 mg except 5 mg on thurs.  The target INR is 2 - 3.  The next INR is due 08/25/2010.  Anticoagulation instructions were given to patient.  Results were reviewed/authorized by Bethena Midget, RN, BSN.  She was notified by Bethena Midget, RN, BSN.         Prior Anticoagulation Instructions: INR 3.8  Skip today's dose. Then take 1/2 tablet everyday except 1 tablet on Monday and Thursday. Recheck 10 days.   Current Anticoagulation Instructions: INR 2.6 Continue 2.5mg s everyday except 5mg s on Mondays and Thursdays. REcheck in 3 weeks.

## 2010-11-07 NOTE — Medication Information (Signed)
Summary: rov/tm  Anticoagulant Therapy  Managed by: Weston Brass, PharmD PCP: Oliver Barre, MD Supervising MD: Nahser Indication 1: Pulmonary Embolism and Infarction (ICD-415.1) Lab Used: [facility] Site: [facility] INR POC 2.9 INR RANGE 2 - 3  Dietary changes: no    Health status changes: yes       Details: Allergies & cold sxs  Bleeding/hemorrhagic complications: yes       Details: Bruise on left leg that is no getting better and pt reports it may be going up her leg  Recent/future hospitalizations: no    Any changes in medication regimen? yes       Details: EmergenC, Flu Shot on 11/17  Recent/future dental: no  Any missed doses?: no       Is patient compliant with meds? yes      Comments: Brusie has no apparent knots and seems typical. Advised pt to continue to monitor and show to cardiologist at next upcoming visit. If it worsens contact MD.   Allergies: 1)  ! Sulfa 2)  ! * Evista 3)  ! * Scallops 4)  Augmentin 5)  * Omnaris  Anticoagulation Management History:      The patient is taking warfarin and comes in today for a routine follow up visit.  Positive risk factors for bleeding include an age of 75 years or older and history of GI bleeding.  The bleeding index is 'intermediate risk'.  Positive CHADS2 values include History of HTN and Age > 40 years old.  The start date was 06/19/2007.  Her last INR was 2.4 ratio.  Anticoagulation responsible provider: Nahser.  INR POC: 2.9.  Cuvette Lot#: [id number].  Exp: 09/2011.    Anticoagulation Management Assessment/Plan:      The patient's current anticoagulation dose is Warfarin sodium 5 mg  tabs: take as directed - currently 2.5 mg except 5 mg on thurs.  The target INR is 2 - 3.  The next INR is due 09/22/2010.  Anticoagulation instructions were given to patient.  Results were reviewed/authorized by Weston Brass, PharmD.  She was notified by Hoy Register, PharmD Candidate.         Prior Anticoagulation Instructions: INR  2.6 Continue 2.5mg s everyday except 5mg s on Mondays and Thursdays. REcheck in 3 weeks.   Current Anticoagulation Instructions: INR 2.9 Continue previous dose of 0.5 tablet daily except 1 tablet on Monday and Thursday Recheck INR in 4 weeks

## 2010-11-07 NOTE — Medication Information (Signed)
Summary: rov/cb  Anticoagulant Therapy  Managed by: Cloyde Reams, RN, BSN PCP: Gwen Pounds MD: Juanda Chance MD, Ariz Terrones Indication 1: Pulmonary Embolism and Infarction (ICD-415.1) Lab Used: [facility] Site: [facility] INR POC 2.7 INR RANGE 2 - 3  Dietary changes: no    Health status changes: no    Bleeding/hemorrhagic complications: no    Recent/future hospitalizations: no    Any changes in medication regimen? no    Recent/future dental: no  Any missed doses?: no       Is patient compliant with meds? yes       Allergies: 1)  ! Sulfa 2)  ! * Evista 3)  Augmentin 4)  * Omnaris  Anticoagulation Management History:      The patient is taking warfarin and comes in today for a routine follow up visit.  Positive risk factors for bleeding include an age of 75 years or older and history of GI bleeding.  The bleeding index is 'intermediate risk'.  Positive CHADS2 values include History of HTN and Age > 75 years old.  The start date was 06/19/2007.  Her last INR was 1.9 ratio.  Anticoagulation responsible provider: Juanda Chance MD, Smitty Cords.  INR POC: 2.7.  Cuvette Lot#: [id number].  Exp: 05/2011.    Anticoagulation Management Assessment/Plan:      The patient's current anticoagulation dose is Warfarin sodium 5 mg  tabs: take as directed - currently 2.5 mg except 5 mg on thurs.  The target INR is 2 - 3.  The next INR is due 05/05/2010.  Anticoagulation instructions were given to patient.  Results were reviewed/authorized by Cloyde Reams, RN, BSN.  She was notified by Cloyde Reams RN.         Prior Anticoagulation Instructions: INR 2.5. Take 0.5 tablet daily except 1 tablet on Mon and Thurs. Recheck in 4 weeks.  Current Anticoagulation Instructions: INR 2.7  Continue on same dosage 1/2 tablet daily except 1 tablet on Mondays and Thursdays.  Recheck in 4 weeks.

## 2010-11-07 NOTE — Progress Notes (Signed)
Summary: pt needs to stop coumadin  Medications Added ENOXAPARIN SODIUM 120 MG/0.8ML SOLN (ENOXAPARIN SODIUM) Inject 1 syringe subcutaneously once daily as directed       Phone Note Call from Patient Call back at Home Phone [phone]   Caller: pt Reason for Call: Talk to Nurse Summary of Call: pt needs to come off coumadin 07/15/10 for removal of cyst and need to know when to start back on coumadin and on what dose or lovanox.  Initial call taken by: Faythe Ghee,  July 06, 2010 8:44 AM  Follow-up for Phone Call        please call coumadin clinic for dose they used before for the bridge lovenox Follow-up by: Corwin Levins MD,  July 06, 2010 9:05 AM  Additional Follow-up for Phone Call Additional follow up Details #1::        Spoke with pt.  Instructions are as follows:   10/8- Take last dose of Coumadin 10/9- No Coumadin or Lovenox 10/10- Start Lovenox 120mg  syringe once daily in AM 10/11-10/12- Continue Lovenox once daily 10/13- Day of Procedure- no Lovenox.  If okay with MD, restart Coumadin.  Take 1 1/2 tablets that night 10/14- Start Lovenox 120mg  once daily and take 1 1/2 tablets of Coumadin 10/15-10/17- Continue Lovenox and Coumadin at previous dose 10/18- Check INR   Rx sent to pharmacy for Lovenox prescription  Additional Follow-up by: Weston Brass PharmD,  July 07, 2010 12:41 PM    New/Updated Medications: ENOXAPARIN SODIUM 120 MG/0.8ML SOLN (ENOXAPARIN SODIUM) Inject 1 syringe subcutaneously once daily as directed Prescriptions: ENOXAPARIN SODIUM 120 MG/0.8ML SOLN (ENOXAPARIN SODIUM) Inject 1 syringe subcutaneously once daily as directed  #10 syringes x 0   Entered by:   Weston Brass PharmD   Authorized by:   Lenoria Farrier, MD, [facility]   Signed by:   Weston Brass PharmD on 07/07/2010   Method used:   [facility]        [facility] [phone][facility] (retail[facility]       [address]       [place], [place]  [address]       Ph: [phone] or [phone]      Fax: [phone]   RxID:   [id number]

## 2010-11-07 NOTE — Assessment & Plan Note (Signed)
Summary: pancreatic cyst/sheri    History of Present Illness Visit Type: Initial Consult Primary GI MD: Elie Goody MD [facility] Primary Provider: Oliver Barre, MD Requesting Provider: Oliver Barre, MD Chief Complaint: pancreatic cyst  History of Present Illness:   PLEASANT 77 YO FEMAL KNOWN TO DR. Russella Dar. SHE HAS HX OF A BENIGN PANCREATIC CYST WHICH WAS WORKED UP IN 2004. SHE HAD EUS AND CYST ASPIRATION AT [facility],ALL BENIGN. NO CHANGE ON CT 2007. SHE HAD SEEN DR. Jonny Ruiz RECENTLY WITH C/O NAUSEA,AND MID ABDOMINAL DISCOMFORT. APPETITE FAIR,WEIGHT DOWN 5 POUNDS OVER THE PAST FEW WEEKS DUE TO NAUSEA.NO FEVER,CHILLS ETC. NO CHANGE IN BOWEL HABITS. LABS 8/17 UNREMARKABLE INCLUDING LFTS,LIPASE. SED RATE  IS 52.  CT ABD/PELVIS 8/23 SHOWS ENLARGING PANCREATIC HEAD CYST,UNILOCULAR-NOW MEASURING 2.7 CM (WAS 1.8 CM)CANNOT R/O BRANCH DUCT MUCINOUS PAPILLARY NEOPLASM,SEROUS CYSTADENOMA,PSEUDOCYST,CHOLEDOCHOCELE.    GI Review of Systems    Reports abdominal pain, acid reflux, bloating, dysphagia with solids, nausea, and  weight loss.     Location of  Abdominal pain: lower abdomen.  over 2 weeks.   Denies belching, chest pain, dysphagia with liquids, heartburn, loss of appetite, vomiting, vomiting blood, and  weight gain.      Reports hemorrhoids and  rectal pain.     Denies anal fissure, black tarry stools, change in bowel habit, constipation, diarrhea, diverticulosis, fecal incontinence, heme positive stool, irritable bowel syndrome, jaundice, light color stool, liver problems, and  rectal bleeding.    Current Medications (verified): 1)  Ecotrin Low Strength 81 Mg  Tbec (Aspirin) .[name].. 1 By Mouth Daily 2)  Zantac 150 Maximum Strength 150 Mg Tabs (Ranitidine Hcl) .[name].. 1po Two Times A Day 3)  Warfarin Sodium 5 Mg  Tabs (Warfarin Sodium) .... Take As Directed - Currently 2.5 Mg Except 5 Mg On Thurs 4)  Losartan Potassium 100 Mg Tabs (Losartan Potassium) .[name].. 1 By Mouth Once Daily 5)  Clariitin 10 Mg 6)   Nasonex 50 Mcg/act Susp (Mometasone Furoate) .... 2 Spray/side Once Daily As Needed 7)  Amlodipine Besylate 2.5 Mg Tabs (Amlodipine Besylate) .[name].. 1po Once Daily 8)  Proair Hfa 108 (90 Base) Mcg/act Aers (Albuterol Sulfate) .... 2 Puffs Every 4 To 6 Hours As Needed 9)  Tums Prn  Allergies (verified): 1)  ! Sulfa 2)  ! * Evista 3)  Augmentin 4)  * Omnaris  Past History:  Past Medical History: PULMONARY HYPERTENSION, SECONDARY (ICD-416.8) DYSPNEA (ICD-786.09) HEADACHE (ICD-784.0) GAIT IMBALANCE (ICD-781.2) UNSPECIFIED HEARING LOSS (ICD-389.9) SINUSITIS- ACUTE-NOS (ICD-461.9) OSTEOARTHRITIS, KNEE, RIGHT (ICD-715.96) CAROTID ARTERY STENOSIS, RIGHT (ICD-433.10) DIVERTICULOSIS, COLON (ICD-562.10) IRRITABLE BOWEL SYNDROME, HX OF (ICD-V12.79) COMMON MIGRAINE (ICD-346.10) DEPRESSION (ICD-311) CYST/PSEUDOCYST, PANCREAS (ICD-577.2) CONSTIPATION, CHRONIC, HX OF (ICD-V12.79) HYPERLIPIDEMIA (ICD-272.4) ALLERGIC RHINITIS (ICD-477.9) PULMONARY EMBOLISM, HX OF (ICD-V12.51) ANXIETY (ICD-300.00) TRANSIENT ISCHEMIC ATTACK, HX OF (ICD-V12.50) OSTEOPOROSIS (ICD-733.00) HYPERTENSION (ICD-401.9) GERD (ICD-530.81) Allergic rhinitis Hyperlipidemia Coronary artery disease- by coronary ca+2 on CT chest 07/2008 Colonic polyps, hx of OAB DVT Esophageal Stricture  Family History: Reviewed history from 10/17/2007 and no changes required. mother with Mi at 66  yo, died at 74yo  with MI father with stroke at 6 yo Family History of Breast Cancer: daughter  Social History: Reviewed history from 12/08/2009 and no changes required. Never Smoked Alcohol use-no Drug use-no Retired Widowed Daily Caffeine Use  Review of Systems       The patient complains of allergy/sinus, fatigue, headaches-new, shortness of breath, swelling of feet/legs, urination - excessive, and urine leakage.  The patient denies anemia, anxiety-new, arthritis/joint pain, back pain,  blood in urine, breast changes/lumps,  change in vision, confusion, cough, coughing up blood, depression-new, fainting, fever, hearing problems, heart murmur, heart rhythm changes, itching, menstrual pain, muscle pains/cramps, night sweats, nosebleeds, pregnancy symptoms, skin rash, sleeping problems, sore throat, swollen lymph glands, thirst - excessive , urination - excessive , urination changes/pain, vision changes, and voice change.         SEE HPI  Vital Signs:  Patient profile:   77 year old female Height:      65 inches Weight:      161 pounds BMI:     26.89 Pulse rate:   80 / minute Pulse rhythm:   regular BP sitting:   158 / 68  (left arm)  Vitals Entered By: Milford Cage NCMA (June 06, 2010 10:21 AM)  Physical Exam  General:  Well developed, well nourished, no acute distress. Head:  Normocephalic and atraumatic. Eyes:  PERRLA, no icterus. Lungs:  Clear throughout to auscultation. Heart:  Regular rate and rhythm; no murmurs, rubs,  or bruits. Abdomen:  SOFT, MINIMALLY TENDER EPIGASTRIUM, NO MASS OR HSM,NO GUARDING OR REBOUND,BS+ Rectal:  NOT DONE Neurologic:  Alert and  oriented x4;  grossly normal neurologically. Psych:  Alert and cooperative. Normal mood and affect.   Impression & Recommendations:  Problem # 1:  CYST/PSEUDOCYST, PANCREAS (ICD-577.2) Assessment Deteriorated 77 YO FEMALE WITH HX OF BENIGN PANCREATIC HEAD CYST DX 2004, EUS AND CYST ASPIRATION AT THAT TIME. NOW ITH NAUSEA, MILD WEIGHT LOSS, UPPER ABDOMINAL DISCOMFORT AND CT SHOWING ENALARGING CYSTIC LESION HEAD OF PANCREAS. R/O INTRADUCTAL PAPILLARY MUCINOUS NEPLASM, CYST ADENOMA, PSEUDOCYST.  MRI WITH MRCP RECOMMENDED PER RADIOLOGY, AND SEEMS BEST OPTION GIVEN PT HAS OTHER COMORBIDITIES,ON CHRONIC COUMADIN. CONTINUE ZANTAC 150 TWICE DAILY PHENERGAN 12.5 Q 6 HOURS AS NEEDED. FOLLOW UP WITH DR. Russella Dar IN  2 WEEKS Orders: MRI Abdomen (MRI Abdomen) MRCP (MRCP)  Problem # 2:  COLONIC POLYPS, HX OF (ICD-V12.72) LAST COLON  2009 DR.  MAGOD-DIVERTICULOSI DIMUNITIVE POLYP, INT HEMORRHOID-F/U 2014  Problem # 3:  PULMONARY HYPERTENSION, SECONDARY (ICD-416.8) Assessment: Comment Only  Problem # 4:  CORONARY ARTERY DISEASE (ICD-414.00) Assessment: Comment Only  Problem # 5:  PULMONARY EMBOLISM, HX OF (ICD-V12.51) Assessment: Comment Only BILATERAL,ON CHRONIC COUMADIN  Patient Instructions: 1)  Stay on Zantac. 2)  We sent a prescription for Phenergan 25 Mg. Take as directed. We [facility]  [facility] to  [facility]. 3)  We scheduled the MRI including the MRCP at [facility] for Thursday 06-08-10.  4)  Do not eat or drink anything past 12:00 Noon. 5)  Copy sent to : Oliver Barre, MD Prescriptions: PROMETHAZINE HCL 25 MG TABS (PROMETHAZINE HCL) Take 1/2 tab every 6-8 hours as needed for nausea  #45 x 0   Entered by:   Lowry Ram NCMA   Authorized by:   Sammuel Cooper PA-c   Signed by:   Lowry Ram NCMA on 06/06/2010   Method used:   [facility]        [facility] [phone][facility] (retail)       [address]       [place], [place]  [address]       Ph: [phone] or [phone]       Fax: [phone]   RxID:   [id number]

## 2010-11-07 NOTE — Consult Note (Signed)
Summary: GI/[facility]  GI/[facility]   Imported By: Sherian Rein 07/04/2010 07:04:38  _____________________________________________________________________  External Attachment:    Type:   Image     Comment:   External Document

## 2010-11-07 NOTE — Procedures (Signed)
Summary: [employer]   Imported By: Sherian Rein 07/04/2010 07:10:56  _____________________________________________________________________  External Attachment:    Type:   Image     Comment:   External Document

## 2010-11-07 NOTE — Miscellaneous (Signed)
Summary: Appointment Canceled  Appointment status changed to canceled by [employer] on 08/23/2010 8:50 AM.  Cancellation Comments --------------------- echo/f/u from previous/pulm htn  Appointment Information ----------------------- Appt Type:  CARDIOLOGY ANCILLARY VISIT      Date:  Friday, August 25, 2010      Time:  11:30 AM for 60 min   Urgency:  Routine   Made By:  Pearson Grippe  To Visit:  [id number]    Reason:  echo/f/u from previous/pulm htn  Appt Comments ------------- -- 08/23/10 8:50: (CEMR) CANCELED -- echo/f/u from previous/pulm htn -- 07/11/10 11:55: (CEMR) BOOKED -- Routine CARDIOLOGY ANCILLARY VISIT at 08/25/2010 11:30 AM for 60 min echo/f/u from previous/pulm htn -- 07/03/10 11:18: (CEMR) BOOKED -- Routine C

## 2010-11-07 NOTE — Assessment & Plan Note (Signed)
Summary: FU [name]  #   Vital Signs:  Patient profile:   77 year old female Height:      65 inches Weight:      165 pounds BMI:     27.56 O2 Sat:      96 % on Room air Temp:     98.5 degrees F oral Pulse rate:   74 / minute BP sitting:   152 / 70  (left arm) Cuff size:   regular  Vitals Entered ByZella Ball Ewing (December 08, 2009 2:59 PM)  O2 Flow:  Room air  CC: followup/RE   Primary Care Provider:  Jonny Ruiz  CC:  followup/RE.  History of Present Illness: overall doing well, has not checked her BP at home recently, has been up several visits but she is declining more intensive tx due to wariness of increased meds and side effects;  has not taken her simvastatin in 2 wks as she states her pharmacist told her it could make the pill more likely to cause GI upset since the "outer pill layer" is broken.  Still take alendronate without GI effects, denies any dysphagia, n/v, abd pain , change in bowel habits, blood or wt loss.  Has had mild dizziness assoc ear fullness and congestion in the past wk without fever, sinus pain, ST , increased cough or fever, chills.  Also c/o increased urinary freq lately with ? more fluid intake, and no dysuria, urgency, incontinence, wetting accidents, blood, increased back pain, n/v, chills - she is requesting  urine studies.  Has ongoing anxiety but declines other treatment.  Has some questions about the recent echo c/w severe pulm HTN for which I have asked her to direct to Dr Delton Coombes.   Here for wellness Diet: Heart Healthy or DM if diabetic Physical Activities: Sedentary Depression/mood screen: Neg, but anxiety persists - as above Hearing: Intact bilateral Visual Acuity: Grossly normal, wears glasses, sees optho yearly ADL's: Capable  Fall Risk: None at this time Home Safety: Good End-of-Life Planning: Advance directive - Full code/I agree   Preventive Screening-Counseling & Management      Drug Use:  no.    Problems Prior to Update: 1)  Frequency,  Urinary  (ICD-788.41) 2)  Dizziness, Chronic  (ICD-780.4) 3)  Hematochezia  (ICD-578.1) 4)  Overactive Bladder  (ICD-596.51) 5)  Colonic Polyps, Hx of  (ICD-V12.72) 6)  Skin Lesion  (ICD-709.9) 7)  Facial Pain  (ICD-784.0) 8)  Coronary Artery Disease  (ICD-414.00) 9)  Frequency, Urinary  (ICD-788.41) 10)  Cellulitis, Leg, Left  (ICD-682.6) 11)  Headache  (ICD-784.0) 12)  Facial Pain  (ICD-784.0) 13)  Chronic Rhinitis  (ICD-472.0) 14)  Sinusitis- Acute-nos  (ICD-461.9) 15)  Fatigue  (ICD-780.79) 16)  Uri  (ICD-465.9) 17)  Dysuria  (ICD-788.1) 18)  Sinusitis- Acute-nos  (ICD-461.9) 19)  Hematochezia  (ICD-578.1) 20)  Viral Infection  (ICD-079.99) 21)  COPD  (ICD-496) 22)  Pulmonary Hypertension, Secondary  (ICD-416.8) 23)  Dyspnea  (ICD-786.09) 24)  Headache  (ICD-784.0) 25)  Gait Imbalance  (ICD-781.2) 26)  Unspecified Hearing Loss  (ICD-389.9) 27)  Sinusitis- Acute-nos  (ICD-461.9) 28)  Osteoarthritis, Knee, Right  (ICD-715.96) 29)  Carotid Artery Stenosis, Right  (ICD-433.10) 30)  Diverticulosis, Colon  (ICD-562.10) 31)  Irritable Bowel Syndrome, Hx of  (ICD-V12.79) 32)  Common Migraine  (ICD-346.10) 33)  Depression  (ICD-311) 34)  Cyst/pseudocyst, Pancreas  (ICD-577.2) 35)  Constipation, Chronic, Hx of  (ICD-V12.79) 36)  Hyperlipidemia  (ICD-272.4) 37)  Allergic Rhinitis  (ICD-477.9)  38)  Pulmonary Embolism, Hx of  (ICD-V12.51) 39)  Anxiety  (ICD-300.00) 40)  Transient Ischemic Attack, Hx of  (ICD-V12.50) 41)  Osteoporosis  (ICD-733.00) 42)  Hypertension  (ICD-401.9) 43)  Gerd  (ICD-530.81)  Medications Prior to Update: 1)  Ecotrin Low Strength 81 Mg  Tbec (Aspirin) .[name].. 1 By Mouth Daily 2)  Alendronate Sodium 70 Mg  Tabs (Alendronate Sodium) .[name].. 1 By Mouth Each Thursday. 3)  Omeprazole 20 Mg Cpdr (Omeprazole) .[name].. 1 By Mouth At Bedtime 4)  Warfarin Sodium 5 Mg  Tabs (Warfarin Sodium) .... Take As Directed - Currently 2.5 Mg Except 5 Mg On Thurs 5)  Losartan  Potassium 100 Mg Tabs (Losartan Potassium) .[name].. 1 By Mouth Once Daily 6)  Proventil Hfa 108 (90 Base) Mcg/act Aers (Albuterol Sulfate) .... Up To 2 Puffs Every 4 Hours As Needed (Rescue Inhaler) 7)  Simvastatin 40 Mg Tabs (Simvastatin) .... Take 0.5  Tablet By Mouth Daily At Bedtime  Current Medications (verified): 1)  Ecotrin Low Strength 81 Mg  Tbec (Aspirin) .[name].. 1 By Mouth Daily 2)  Alendronate Sodium 70 Mg  Tabs (Alendronate Sodium) .[name].. 1 By Mouth Each Thursday. 3)  Omeprazole 20 Mg Cpdr (Omeprazole) .[name].. 1 By Mouth At Bedtime 4)  Warfarin Sodium 5 Mg  Tabs (Warfarin Sodium) .... Take As Directed - Currently 2.5 Mg Except 5 Mg On Thurs 5)  Losartan Potassium 100 Mg Tabs (Losartan Potassium) .[name].. 1 By Mouth Once Daily 6)  Proventil Hfa 108 (90 Base) Mcg/act Aers (Albuterol Sulfate) .... Up To 2 Puffs Every 4 Hours As Needed (Rescue Inhaler) 7)  Simvastatin 20 Mg Tabs (Simvastatin) .[name].. 1 By Mouth Once Daily 8)  Clariitin 10 Mg 9)  Nasonex 50 Mcg/act Susp (Mometasone Furoate) .... 2 Spray/side Once Daily  Allergies (verified): 1)  ! Sulfa 2)  ! * Evista 3)  Augmentin 4)  * Omnaris  Past History:  Past Surgical History: Last updated: 08/05/2007 Hysterectomy egd 1999 Cholecystectomy Tonsillectomy Appendectomy s/p sinus surgury  Family History: Last updated: 10/17/2007 mother with Mi at 85  yo, died at 35yo  with MI father with stroke at 8 yo  Social History: Last updated: 12/08/2009 Never Smoked Alcohol use-no Drug use-no Retired  Risk Factors: Smoking Status: never (10/17/2007)  Past Medical History: PULMONARY HYPERTENSION, SECONDARY (ICD-416.8) DYSPNEA (ICD-786.09) HEADACHE (ICD-784.0) GAIT IMBALANCE (ICD-781.2) UNSPECIFIED HEARING LOSS (ICD-389.9) SINUSITIS- ACUTE-NOS (ICD-461.9) OSTEOARTHRITIS, KNEE, RIGHT (ICD-715.96) CAROTID ARTERY STENOSIS, RIGHT (ICD-433.10) DIVERTICULOSIS, COLON (ICD-562.10) IRRITABLE BOWEL SYNDROME, HX OF (ICD-V12.79) COMMON  MIGRAINE (ICD-346.10) DEPRESSION (ICD-311) CYST/PSEUDOCYST, PANCREAS (ICD-577.2) CONSTIPATION, CHRONIC, HX OF (ICD-V12.79) HYPERLIPIDEMIA (ICD-272.4) ALLERGIC RHINITIS (ICD-477.9) PULMONARY EMBOLISM, HX OF (ICD-V12.51) ANXIETY (ICD-300.00) TRANSIENT ISCHEMIC ATTACK, HX OF (ICD-V12.50) OSTEOPOROSIS (ICD-733.00) HYPERTENSION (ICD-401.9) GERD (ICD-530.81) Allergic rhinitis Hyperlipidemia Coronary artery disease- by coronary ca+2 on CT chest 07/2008 Colonic polyps, hx of OAB  Family History: Reviewed history from 10/17/2007 and no changes required. mother with Mi at 63  yo, died at 27yo  with MI father with stroke at 51 yo  Social History: Reviewed history from 07/15/2008 and no changes required. Never Smoked Alcohol use-no Drug use-no Retired Drug Use:  no  Review of Systems  The patient denies anorexia, fever, weight loss, weight gain, vision loss, decreased hearing, hoarseness, syncope, peripheral edema, prolonged cough, headaches, hemoptysis, abdominal pain, melena, hematochezia, severe indigestion/heartburn, hematuria, suspicious skin lesions, difficulty walking, unusual weight change, abnormal bleeding, enlarged lymph nodes, and angioedema.         all otherwise negative per pt -  Physical  Exam  Psych:  depressed affect and moderately anxious.     Impression & Recommendations:  Problem # 1:  Preventive Health Care (ICD-V70.0) Overall doing well, age appropriate education and counseling updated and referral for appropriate preventive services done unless declined, immunizations up to date or declined, diet counseling done if overweight, urged to quit smoking if smokes , most recent labs reviewed and current ordered if appropriate, ecg reviewed or declined (interpretation per ECG scanned in the EMR if done); information regarding Medicare Prevention requirements given if appropriate   Problem # 2:  FREQUENCY, URINARY (ICD-788.41) exam benign, to check urine studies per  pt requests, consider vesicare if urine studies neg for OAB, and/or urology consult Orders: T-Culture, Urine ([id number]) TLB-Udip w/ Micro ([id number])  Problem # 3:  PULMONARY HYPERTENSION, SECONDARY (ICD-416.8) functionally stable, asked pt to f/u with pulm as planned  Problem # 4:  HYPERTENSION (ICD-401.9)  Her updated medication list for this problem includes:    Losartan Potassium 100 Mg Tabs (Losartan potassium) .[name]... 1 by mouth once daily  BP today: 152/70 Prior BP: 150/70 (10/14/2009)  Labs Reviewed: K+: 4.5 (04/06/2009) Creat: : 0.5 (04/06/2009)   Chol: 230 (04/06/2009)   HDL: 63.70 (04/06/2009)   LDL: DEL (07/15/2008)   TG: 113.0 (04/06/2009) mild persistent elevated, again declines more tx though I advised amlodipine 5 gm  Orders: Prescription Created Electronically [phone])  Problem # 5:  ANXIETY (ICD-300.00) declines SSRI trial,  Problem # 6:  HYPERLIPIDEMIA (ICD-272.4)  Her updated medication list for this problem includes:    Simvastatin 20 Mg Tabs (Simvastatin) .[name]... 1 by mouth once daily treat as above, f/u any worsening signs or symptoms  - gave new rx so pill will not need to be split;  Pt to continue diet efforts, good med tolerance;  - goal LDL less than 70 , to re-start med , f/u. lab next visit  Labs Reviewed: SGOT: 30 (04/06/2009)   SGPT: 27 (04/06/2009)   HDL:63.70 (04/06/2009), 61.8 (07/15/2008)  LDL:DEL (07/15/2008)  Chol:230 (04/06/2009), 233 (07/15/2008)  Trig:113.0 (04/06/2009), 130 (07/15/2008)  Problem # 7:  ALLERGIC RHINITIS (ICD-477.9)  Her updated medication list for this problem includes:    Nasonex 50 Mcg/act Susp (Mometasone furoate) .[name]... 2 spray/side once daily treat as above, f/u any worsening signs or symptoms , hopefully to help the left ear fullness and vertigo  Complete Medication List: 1)  Ecotrin Low Strength 81 Mg Tbec (Aspirin) .[name].. 1 by mouth daily 2)  Alendronate Sodium 70 Mg Tabs (Alendronate sodium) .[name].. 1 by mouth  each thursday. 3)  Omeprazole 20 Mg Cpdr (Omeprazole) .[name].. 1 by mouth at bedtime 4)  Warfarin Sodium 5 Mg Tabs (Warfarin sodium) .... Take as directed - currently 2.5 mg except 5 mg on thurs 5)  Losartan Potassium 100 Mg Tabs (Losartan potassium) .[name].. 1 by mouth once daily 6)  Proventil Hfa 108 (90 Base) Mcg/act Aers (Albuterol sulfate) .... Up to 2 puffs every 4 hours as needed (rescue inhaler) 7)  Simvastatin 20 Mg Tabs (Simvastatin) .[name].. 1 by mouth once daily 8)  Clariitin 10 Mg  9)  Nasonex 50 Mcg/act Susp (Mometasone furoate) .... 2 spray/side once daily  Other Orders: First annual wellness visit with prevention plan  [phone])  Patient Instructions: 1)  start your simvastatin at 20 mg per day 2)  please try the nasonex at 2 spray/side once daily  3)  Continue all previous medications as before this visit  4)  Check your Blood Pressure regularly. If it  is above 140/90: you should call to start amlodipine 5 mg per day 5)  Please go to the Lab in the basement for your urine tests today  6)  Please see Pulmonary for further discussion regarding treatment for the Pulmonary HTN 7)  Please schedule a follow-up appointment in 3 months - You will be due for your yearly blood work at that time Prescriptions: NASONEX 50 MCG/ACT SUSP (MOMETASONE FUROATE) 2 spray/side once daily  #1 x 11   Entered and Authorized by:   Corwin Levins MD   Signed by:   Corwin Levins MD on 12/08/2009   Method used:   Print then Give to Patient   RxID:   [id number] SIMVASTATIN 20 MG TABS (SIMVASTATIN) 1 by mouth once daily  #90 x 3   Entered and Authorized by:   Corwin Levins MD   Signed by:   Corwin Levins MD on 12/08/2009   Method used:   Print then Give to Patient   RxID:   [id number]

## 2010-11-07 NOTE — Medication Information (Signed)
Summary: rov/tm  Anticoagulant Therapy  Managed by: Bethena Midget, RN, BSN PCP: Gwen Pounds MD: Ladona Ridgel MD, Sharlot Gowda Indication 1: Pulmonary Embolism and Infarction (ICD-415.1) Lab Used: [facility] Site: [facility] INR POC 2.7 INR RANGE 2 - 3  Dietary changes: no    Health status changes: no    Bleeding/hemorrhagic complications: no    Recent/future hospitalizations: no    Any changes in medication regimen? no    Recent/future dental: no  Any missed doses?: no       Is patient compliant with meds? yes       Allergies: 1)  ! Sulfa 2)  ! * Evista 3)  Augmentin 4)  * Omnaris  Anticoagulation Management History:      The patient is taking warfarin and comes in today for a routine follow up visit.  Positive risk factors for bleeding include an age of 75 years or older and history of GI bleeding.  The bleeding index is 'intermediate risk'.  Positive CHADS2 values include History of HTN and Age > 82 years old.  The start date was 06/19/2007.  Her last INR was 1.9 ratio.  Anticoagulation responsible provider: Ladona Ridgel MD, Sharlot Gowda.  INR POC: 2.7.  Cuvette Lot#: [id number].  Exp: 02/2011.    Anticoagulation Management Assessment/Plan:      The patient's current anticoagulation dose is Warfarin sodium 5 mg  tabs: take as directed - currently 2.5 mg except 5 mg on thurs.  The target INR is 2 - 3.  The next INR is due 01/09/2010.  Anticoagulation instructions were given to patient.  Results were reviewed/authorized by Bethena Midget, RN, BSN.  She was notified by Bethena Midget, RN, BSN.         Prior Anticoagulation Instructions: INR 2.4 Continue 2.5mg s everyday except 5mg s on Mondays and Thursdays. Recheck in 2 weeks.   Current Anticoagulation Instructions: INR 2.7 Continue 2.5mg s everyday except 5mg s on Mondays and Thursdays. Recheck in 4 weeks.

## 2010-11-07 NOTE — Miscellaneous (Signed)
Summary: BONE DENSITY  Clinical Lists Changes  Orders: Added new Test order of T-Bone Densitometry (77080) - Signed Added new Test order of T-Lumbar Vertebral Assessment (77082) - Signed 

## 2010-11-07 NOTE — Assessment & Plan Note (Signed)
Summary: thromboembolic dz, PAH   Visit Type:  Follow-up Copy to:  [facility]ert Primary Provider/Referring Provider:  Jonny Ruiz  CC:  PAH.  Patient needs to qualify for O2 on room air today.  Patient has no complaints today.[name]  History of Present Illness: 77yowf  never smoker, allergic rhinitis, hx bilat PE in September 2008,with asociated Pulm HTN (PASP 70 with intact LV fxn).   Started on Spiriva after PFT's showed mild to mod AFL without BD response; she stopped this due to dry eyes, L eye pain. She may have lost a little ground off the Spiriva.  Walking oximetry shows hypoxemia, and we started O2 with exertion. She is not using reliably - doesn't take it with her into stores, etc.   Repeated V/Q scan Oct 05/2008  which suggested pulmonary emboli but ct chest negative and echo showed improved PAS  November 22, 2008 just wearing 02 with housework  can't really tell it's helping. Pt denies any significant sore throat, or excess secretions, fever, chills, sweats, unintended wt loss, pleuritic or exertional cp, orthopnea pnd or leg swelling.   Does have lots of itching/sneezing nasal congestion but no wheezing or chest tightness.  ROV 09/14/09 -- pulmonary HTN in setting thromboemboli (improved on CT scan 1 yr ago). She tells me that her exertional tolerance is good, uses her O2 with most exertion. Compliant with coumadin.   ROV 05/18/10 -- returns to f/u [facility] in setting VTE. Since last visit started on on allergy immunotherapy, has helped her significantly. Able to exert, wears her O2 when active. No significant changes in clinical status. Last TTE 11/11. Not on any BD's.   Current Medications (verified): 1)  Ecotrin Low Strength 81 Mg  Tbec (Aspirin) .[name].. 1 By Mouth Daily 2)  Alendronate Sodium 70 Mg  Tabs (Alendronate Sodium) .[name].. 1 By Mouth Each Thursday. 3)  Omeprazole 20 Mg Cpdr (Omeprazole) .[name].. 1 By Mouth At Bedtime 4)  Warfarin Sodium 5 Mg  Tabs (Warfarin Sodium) .... Take As Directed - Currently 2.5  Mg Except 5 Mg On Thurs 5)  Losartan Potassium 100 Mg Tabs (Losartan Potassium) .[name].. 1 By Mouth Once Daily 6)  Clariitin 10 Mg 7)  Nasonex 50 Mcg/act Susp (Mometasone Furoate) .... 2 Spray/side Once Daily 8)  Amlodipine Besylate 2.5 Mg Tabs (Amlodipine Besylate) .[name].. 1po Once Daily 9)  Proair Hfa 108 (90 Base) Mcg/act Aers (Albuterol Sulfate) .... 2 Puffs Every 4 To 6 Hours As Needed 10)  Allergy Shots .... Once Weekly With Dr. Lismore Callas  Allergies (verified): 1)  ! Sulfa 2)  ! * Evista 3)  Augmentin 4)  * Omnaris  Vital Signs:  Patient profile:   77 year old female Height:      65 inches (165.10 cm) Weight:      163.50 pounds (74.32 kg) BMI:     27.31 O2 Sat:      92 % on Room air Temp:     98.1 degrees F (36.72 degrees C) oral Pulse rate:   80 / minute BP sitting:   144 / 80  (left arm) Cuff size:   regular  Vitals Entered By: Michel Bickers CMA (May 18, 2010 9:26 AM)  O2 Sat at Rest %:  92 O2 Flow:  Room air CC: PAH.  Patient needs to qualify for O2 on room air today.  Patient has no complaints today. Comments Medications reviewed with the patient. Daytime phone verified. Michel Bickers [facility]  May 18, 2010 9:29 AM   Serial Vital Signs/Assessments:  Comments: 9:28 AM Ambulatory Pulse Oximetry  Resting; HR___71__    02 Sat__97% on room air___  Lap1 (185 feet)   HR__84___   02 Sat__92% on room air___ Lap2 (185 feet)   HR__93___   02 Sat__86% on room air___    Lap3 (185 feet)   HR_____   02 Sat_____  ___Test Completed without Difficulty _x__Test Stopped due to: patients sats dropped to 86% on room air after walking 2 laps. She was placed on 1 liter of oxygen and sats recovered to 97% and pulse was 71.  By: Michel Bickers CMA    Physical Exam  General:  alert and well-developed.   Head:  normocephalic and atraumatic.   Eyes:  conjunctiva and sclera clear Nose:  no deformity, discharge, inflammation, or lesions Mouth:  no deformity or lesions Lungs:  normal  respiratory effort and normal breath sounds.   Heart:  normal rate and regular rhythm.   Abdomen:  not examined Extremities:  no edema, no erythema  Neurologic:  non-focal Psych:  alert and cooperative; normal mood and affect; normal attention span and concentration   Impression & Recommendations:  Problem # 1:  PULMONARY HYPERTENSION, SECONDARY (ICD-416.8)  Clinically stable. She would like to be conservative for now since she isn't worsening clinically.  - TTE in 11/11 - doesn't want to pursue R heart cath at this time, may become relevant if echo changes dramatically or if SOB worsens - qualified for O2 today on walking oximetry  Orders: Est. Patient Level IV (44034) Echo Referral (Echo)  Problem # 2:  PULMONARY EMBOLISM, HX OF (ICD-V12.51)  Her updated medication list for this problem includes:    Ecotrin Low Strength 81 Mg Tbec (Aspirin) .[name]... 1 by mouth daily    Warfarin Sodium 5 Mg Tabs (Warfarin sodium) .[name]... Take as directed - currently 2.5 mg except 5 mg on thurs  Orders: Est. Patient Level IV (74259)  Problem # 3:  CHRONIC RHINITIS (ICD-472.0) Better on allergy shots. - claritin, nasonex.   Patient Instructions: 1)  Continue your coumadin  2)  You need to wear your oxygen with exertion.  3)  We will repeat your echocardiogram in November  4)  Follow up with Dr Delton Coombes after the echocardiogram to discuss the results.

## 2010-11-07 NOTE — Progress Notes (Signed)
Summary: Proventil PA  Phone Note From Pharmacy   Details of Request: [facility] [phone] Details of Action Taken: [id number] Summary of Call: PA request--Proventil. Form completed. Initial call taken by: Lucious Groves,  April 17, 2010 3:30 PM  Follow-up for Phone Call        I see that Zella Ball noted denial in new phone. Closed this note. Follow-up by: Lucious Groves,  April 19, 2010 11:44 AM

## 2010-11-07 NOTE — Medication Information (Signed)
Summary: rov/ez  Anticoagulant Therapy  Managed by: Bethena Midget, RN, BSN PCP: Gwen Pounds MD: Johney Frame MD, Fayrene Fearing Indication 1: Pulmonary Embolism and Infarction (ICD-415.1) Lab Used: [facility] Site: [facility] INR POC 2.4 INR RANGE 2 - 3  Dietary changes: no    Health status changes: yes       Details: SOB pt. states that has remained since PE, unchanged.   Bleeding/hemorrhagic complications: yes       Details: Hx of hemorroidal bleeding since childbirth, Dr. Melvyn Novas is aware and she sees him next week.   Recent/future hospitalizations: no    Any changes in medication regimen? no    Recent/future dental: no  Any missed doses?: no       Is patient compliant with meds? yes       Allergies: 1)  ! Sulfa 2)  ! * Evista 3)  Augmentin 4)  * Omnaris  Anticoagulation Management History:      The patient is taking warfarin and comes in today for a routine follow up visit.  Positive risk factors for bleeding include an age of 13 years or older and history of GI bleeding.  The bleeding index is 'intermediate risk'.  Positive CHADS2 values include History of HTN and Age > 79 years old.  The start date was 06/19/2007.  Her last INR was 1.9 ratio.  Anticoagulation responsible provider: Rheannon Cerney MD, Fayrene Fearing.  INR POC: 2.4.  Cuvette Lot#: [id number].  Exp: 01/2011.    Anticoagulation Management Assessment/Plan:      The patient's current anticoagulation dose is Warfarin sodium 5 mg  tabs: take as directed - currently 2.5 mg except 5 mg on thurs.  The target INR is 2 - 3.  The next INR is due 12/07/2009.  Anticoagulation instructions were given to patient.  Results were reviewed/authorized by Bethena Midget, RN, BSN.  She was notified by Bethena Midget, RN, BSN.         Prior Anticoagulation Instructions: INR: 1.5 Take 1 ane 1/2 tablets today and tomorrow, then resume to same dosage of 1/2 tablet daily except 1 tablet on Mondays and Thursdays Recheck in 2 weeks  Current Anticoagulation  Instructions: INR 2.4 Continue 2.5mg s everyday except 5mg s on Mondays and Thursdays. Recheck in 2 weeks.

## 2010-11-07 NOTE — Medication Information (Signed)
Summary: rov/sp  Anticoagulant Therapy  Managed by: Cloyde Reams, RN, BSN PCP: Oliver Barre, MD Supervising MD: Daleen Squibb MD, Maisie Fus Indication 1: Pulmonary Embolism and Infarction (ICD-415.1) Lab Used: [facility] Site: [facility] INR POC 3.9 INR RANGE 2 - 3  Dietary changes: no    Health status changes: no    Bleeding/hemorrhagic complications: no    Recent/future hospitalizations: no    Any changes in medication regimen? no    Recent/future dental: no  Any missed doses?: no       Is patient compliant with meds? yes       Allergies: 1)  ! Sulfa 2)  ! * Evista 3)  Augmentin 4)  * Omnaris  Anticoagulation Management History:      The patient is taking warfarin and comes in today for a routine follow up visit.  Positive risk factors for bleeding include an age of 75 years or older and history of GI bleeding.  The bleeding index is 'intermediate risk'.  Positive CHADS2 values include History of HTN and Age > 78 years old.  The start date was 06/19/2007.  Her last INR was 2.4 ratio.  Anticoagulation responsible provider: Daleen Squibb MD, Maisie Fus.  INR POC: 3.9.  Cuvette Lot#: [id number].  Exp: 07/2011.    Anticoagulation Management Assessment/Plan:      The patient's current anticoagulation dose is Warfarin sodium 5 mg  tabs: take as directed - currently 2.5 mg except 5 mg on thurs.  The target INR is 2 - 3.  The next INR is due 07/18/2010.  Anticoagulation instructions were given to patient.  Results were reviewed/authorized by Cloyde Reams, RN, BSN.  She was notified by Cloyde Reams RN.         Prior Anticoagulation Instructions: INR 2.8  Continue 1/2 tablet daily except 1 tablet on Mon and Thu.  Return to clinic in 4 weeks.  Current Anticoagulation Instructions: INR 3.9  Skip tomorrow's dosage of Coumadin, then take 1/2 tablet on Thursdays, then resume same dosage 1/2 tablet daily except 1 tablet on Mondays and Thursdays.  Recheck in 2-3 weeks.

## 2010-11-07 NOTE — Medication Information (Signed)
Summary: Summary of Benefits for Prolia/ProliaPlus  Summary of Benefits for Prolia/ProliaPlus   Imported By: Sherian Rein 01/24/2010 12:03:07  _____________________________________________________________________  External Attachment:    Type:   Image     Comment:   External Document

## 2010-11-07 NOTE — Procedures (Signed)
Summary: EGD/[facility]  EGD/[facility]   Imported By: Sherian Rein 07/04/2010 07:12:33  _____________________________________________________________________  External Attachment:    Type:   Image     Comment:   External Document

## 2010-11-07 NOTE — Medication Information (Signed)
Summary: Prior Auth/[facility]  Prior Auth/[facility]   Imported By: Lester Mendocino 04/20/2010 08:26:47  _____________________________________________________________________  External Attachment:    Type:   Image     Comment:   External Document

## 2010-11-07 NOTE — Letter (Signed)
Summary: [facility]   Imported By: Lester [place] 03/07/2010 10:17:33  _____________________________________________________________________  External Attachment:    Type:   Image     Comment:   External Document

## 2010-11-09 ENCOUNTER — Ambulatory Visit: Admit: 2010-11-09 | Payer: Self-pay

## 2010-11-09 NOTE — Medication Information (Signed)
Summary: rov/tm  Anticoagulant Therapy  Managed by: Cloyde Reams, RN, BSN PCP: Oliver Barre, MD Supervising MD: Myrtis Ser MD, Tinnie Gens Indication 1: Pulmonary Embolism and Infarction (ICD-415.1) Lab Used: [facility] Site: [facility] INR POC 3.1 INR RANGE 2 - 3  Dietary changes: no    Health status changes: no    Bleeding/hemorrhagic complications: no    Recent/future hospitalizations: yes       Details: Cateract surgery on 10/17/10.   Any changes in medication regimen? no    Recent/future dental: no  Any missed doses?: no       Is patient compliant with meds? yes       Allergies: 1)  ! Sulfa 2)  ! * Evista 3)  ! * Scallops 4)  Augmentin 5)  * Omnaris  Anticoagulation Management History:      The patient is taking warfarin and comes in today for a routine follow up visit.  Positive risk factors for bleeding include an age of 75 years or older and history of GI bleeding.  The bleeding index is 'intermediate risk'.  Positive CHADS2 values include History of HTN and Age > 18 years old.  The start date was 06/19/2007.  Her last INR was 2.4 ratio.  Anticoagulation responsible provider: Myrtis Ser MD, Tinnie Gens.  INR POC: 3.1.  Cuvette Lot#: [id number].  Exp: 11/2011.    Anticoagulation Management Assessment/Plan:      The patient's current anticoagulation dose is Warfarin sodium 5 mg  tabs: take as directed - currently 2.5 mg except 5 mg on thurs.  The target INR is 2 - 3.  The next INR is due 11/09/2010.  Anticoagulation instructions were given to patient.  Results were reviewed/authorized by Cloyde Reams, RN, BSN.  She was notified by Cloyde Reams RN.         Prior Anticoagulation Instructions: INR 2.0 Continue 2.5mg s everyday except 5mg s on Mondays and Thursdays. Recheck in 4 weeks.   Current Anticoagulation Instructions: INR 3.1  Skip tomorrow's dosage of Coumdin, then resume same dosage 1/2 tablet daily except 1 tablet on Mondays and Thursdays.  Recheck in 4 weeks.

## 2010-11-09 NOTE — Letter (Signed)
Summary: [facility]   Imported By: Lennie Odor 10/17/2010 11:09:58  _____________________________________________________________________  External Attachment:    Type:   Image     Comment:   External Document

## 2010-11-09 NOTE — Medication Information (Signed)
Summary: rov/nb  Anticoagulant Therapy  Managed by: Bethena Midget, RN, BSN PCP: Oliver Barre, MD Supervising MD: Gala Romney MD, Reuel Boom Indication 1: Pulmonary Embolism and Infarction (ICD-415.1) Lab Used: [facility] Site: [facility] INR POC 2.0 INR RANGE 2 - 3  Dietary changes: no    Health status changes: no    Bleeding/hemorrhagic complications: yes       Details: 2 Bruises noted on lower left ant. leg, she states she hit leg and received the lower bruise not sure of bruise on top.   Recent/future hospitalizations: no    Any changes in medication regimen? no    Recent/future dental: no  Any missed doses?: no       Is patient compliant with meds? yes       Allergies: 1)  ! Sulfa 2)  ! * Evista 3)  ! * Scallops 4)  Augmentin 5)  * Omnaris  Anticoagulation Management History:      The patient is taking warfarin and comes in today for a routine follow up visit.  Positive risk factors for bleeding include an age of 47 years or older and history of GI bleeding.  The bleeding index is 'intermediate risk'.  Positive CHADS2 values include History of HTN and Age > 9 years old.  The start date was 06/19/2007.  Her last INR was 2.4 ratio.  Anticoagulation responsible provider: Bensimhon MD, Reuel Boom.  INR POC: 2.0.  Cuvette Lot#: [id number].  Exp: 11/2011.    Anticoagulation Management Assessment/Plan:      The patient's current anticoagulation dose is Warfarin sodium 5 mg  tabs: take as directed - currently 2.5 mg except 5 mg on thurs.  The target INR is 2 - 3.  The next INR is due 10/17/2010.  Anticoagulation instructions were given to patient.  Results were reviewed/authorized by Bethena Midget, RN, BSN.  She was notified by Bethena Midget, RN, BSN.         Prior Anticoagulation Instructions: INR 2.9 Continue previous dose of 0.5 tablet daily except 1 tablet on Monday and Thursday Recheck INR in 4 weeks   Current Anticoagulation Instructions: INR 2.0 Continue 2.5mg s everyday except  5mg s on Mondays and Thursdays. Recheck in 4 weeks.

## 2010-11-09 NOTE — Letter (Signed)
Summary: [facility]  [facility]   Imported By: Sherian Rein 10/11/2010 09:56:32  _____________________________________________________________________  External Attachment:    Type:   Image     Comment:   External Document

## 2010-11-09 NOTE — Assessment & Plan Note (Signed)
Summary: PAH, hx VTE   Visit Type:  Follow-up Copy to:  Oliver Barre, MD Primary Provider/Referring Provider:  Oliver Barre, MD  CC:  PAH...PE...follow-up Echo results...no complaints today.  History of Present Illness: 77yowf  never smoker, allergic rhinitis, hx bilat PE in September 2008,with asociated Pulm HTN (PASP 70 with intact LV fxn).   Started on Spiriva after PFT's showed mild to mod AFL without BD response; she stopped this due to dry eyes, L eye pain. She may have lost a little ground off the Spiriva.  Walking oximetry shows hypoxemia, and we started O2 with exertion. She is not using reliably - doesn't take it with her into stores, etc.   Repeated V/Q scan Oct 05/2008  which suggested pulmonary emboli but ct chest negative and echo showed improved PAS  November 22, 2008 just wearing 02 with housework  can't really tell it's helping. Pt denies any significant sore throat, or excess secretions, fever, chills, sweats, unintended wt loss, pleuritic or exertional cp, orthopnea pnd or leg swelling.   Does have lots of itching/sneezing nasal congestion but no wheezing or chest tightness.  ROV 09/14/09 -- pulmonary HTN in setting thromboemboli (improved on CT scan 1 yr ago). She tells me that her exertional tolerance is good, uses her O2 with most exertion. Compliant with coumadin.   ROV 05/18/10 -- returns to f/u [facility] in setting VTE. Since last visit started on on allergy immunotherapy, has helped her significantly. Able to exert, wears her O2 when active. No significant changes in clinical status. Last TTE 11/11. Not on any BD's.   ROV 10/16/10 -- PAH presumedly from VTE. Her repeat TTE was 08/25/10, showed estimated PASP 62 (down from , 2008). Still on coumadin. Takes claritin daily, occas nasonex. Still has o2 to use w exertion. No new symptoms. Taking allergy shots, has improved her symptoms.   Current Medications (verified): 1)  Ecotrin Low Strength 81 Mg  Tbec (Aspirin) .[name].. 1 By Mouth  Daily 2)  Zantac 150 Maximum Strength 150 Mg Tabs (Ranitidine Hcl) .[name].. 1po Two Times A Day 3)  Warfarin Sodium 5 Mg  Tabs (Warfarin Sodium) .... Take As Directed - Currently 2.5 Mg Except 5 Mg On Thurs 4)  Losartan Potassium 100 Mg Tabs (Losartan Potassium) .[name].. 1 By Mouth Once Daily 5)  Claritin 10 Mg Tabs (Loratadine) .... Take One By Mouth Once Daily 6)  Nasonex 50 Mcg/act Susp (Mometasone Furoate) .... 2 Spray/side Once Daily As Needed 7)  Amlodipine Besylate 2.5 Mg Tabs (Amlodipine Besylate) .[name].. 1po Once Daily 8)  Proair Hfa 108 (90 Base) Mcg/act Aers (Albuterol Sulfate) .... 2 Puffs Every 4 To 6 Hours As Needed 9)  Tums 500 Mg Chew (Calcium Carbonate Antacid) .... As Needed 10)  Promethazine Hcl 25 Mg Tabs (Promethazine Hcl) .... Take 1/2 Tab Every 6-8 Hours As Needed For Nausea 11)  Papaya Enzyme  Chew (Digestive Enzymes) .... Chew 2 Tabs With Meals As Needed  Allergies (verified): 1)  ! Sulfa 2)  ! * Evista 3)  ! * Scallops 4)  Augmentin 5)  * Omnaris  Vital Signs:  Patient profile:   77 year old female Height:      65 inches (165.10 cm) Weight:      166.38 pounds (75.63 kg) BMI:     27.79 O2 Sat:      93 % on Room air Temp:     98.1 degrees F (36.72 degrees C) oral Pulse rate:   77 / minute BP sitting:  140 / 80  (left arm) Cuff size:   regular  Vitals Entered By: Michel Bickers CMA (October 16, 2010 3:14 PM)  O2 Sat at Rest %:  93 O2 Flow:  Room air CC: PAH...PE...follow-up Echo results...no complaints today Comments Medications reviewed with patient Michel Bickers [facility]  October 16, 2010 3:23 PM   Physical Exam  General:  alert and well-developed.   Head:  normocephalic and atraumatic.   Eyes:  conjunctiva and sclera clear Nose:  no deformity, discharge, inflammation, or lesions Mouth:  no deformity or lesions Lungs:  normal respiratory effort and normal breath sounds.   Heart:  normal rate and regular rhythm.   Abdomen:  not examined Extremities:  no edema, no  erythema  Neurologic:  non-focal Psych:  alert and cooperative; normal mood and affect; normal attention span and concentration   Impression & Recommendations:  Problem # 1:  PULMONARY HYPERTENSION, SECONDARY (ICD-416.8)  Orders: Echo Referral (Echo) Est. Patient Level III (21308)  Problem # 2:  PULMONARY EMBOLISM, HX OF (ICD-V12.51)  The following medications were removed from the medication list:    Enoxaparin Sodium 120 Mg/0.52ml Soln (Enoxaparin sodium) ..... Inject 1 syringe subcutaneously once daily as directed Her updated medication list for this problem includes:    Ecotrin Low Strength 81 Mg Tbec (Aspirin) .[name]... 1 by mouth daily    Warfarin Sodium 5 Mg Tabs (Warfarin sodium) .[name]... Take as directed - currently 2.5 mg except 5 mg on thurs  Orders: Est. Patient Level III (65784)  Patient Instructions: 1)  Stay on your coumadin once daily  2)  We will repeat your echocardiogram in November 2012. 3)  Follow up with Dr Delton Coombes after that study, or sooner if you have any problems.

## 2010-11-14 ENCOUNTER — Encounter: Payer: Self-pay | Admitting: Cardiovascular Disease

## 2010-11-14 ENCOUNTER — Encounter (INDEPENDENT_AMBULATORY_CARE_PROVIDER_SITE_OTHER): Payer: Medicare Other

## 2010-11-14 DIAGNOSIS — I2699 Other pulmonary embolism without acute cor pulmonale: Secondary | ICD-10-CM

## 2010-11-14 DIAGNOSIS — Z7901 Long term (current) use of anticoagulants: Secondary | ICD-10-CM

## 2010-11-21 ENCOUNTER — Ambulatory Visit (INDEPENDENT_AMBULATORY_CARE_PROVIDER_SITE_OTHER): Payer: Medicare Other | Admitting: Internal Medicine

## 2010-11-21 ENCOUNTER — Encounter: Payer: Self-pay | Admitting: Internal Medicine

## 2010-11-21 DIAGNOSIS — M810 Age-related osteoporosis without current pathological fracture: Secondary | ICD-10-CM

## 2010-11-21 DIAGNOSIS — I10 Essential (primary) hypertension: Secondary | ICD-10-CM

## 2010-11-21 DIAGNOSIS — J019 Acute sinusitis, unspecified: Secondary | ICD-10-CM

## 2010-11-23 NOTE — Medication Information (Signed)
Summary: [facility]  Anticoagulant Therapy  Managed by: Windell Hummingbird, RN PCP: Oliver Barre, MD Supervising MD: Eden Emms MD, Theron Arista Indication 1: Pulmonary Embolism and Infarction (ICD-415.1) Lab Used: [facility] Site: [facility] INR POC 2.9 INR RANGE 2 - 3    Bleeding/hemorrhagic complications: yes       Details: Bleeding per surgeon during cataract surgery 4 weeks ago  Recent/future hospitalizations: no    Any changes in medication regimen? no    Recent/future dental: no  Any missed doses?: no       Is patient compliant with meds? yes       Allergies: 1)  ! Sulfa 2)  ! * Evista 3)  ! * Scallops 4)  Augmentin 5)  * Omnaris  Anticoagulation Management History:      The patient is taking warfarin and comes in today for a routine follow up visit.  Positive risk factors for bleeding include an age of 75 years or older and history of GI bleeding.  The bleeding index is 'intermediate risk'.  Positive CHADS2 values include History of HTN and Age > 77 years old.  The start date was 06/19/2007.  Her last INR was 2.4 ratio.  Anticoagulation responsible provider: Eden Emms MD, Theron Arista.  INR POC: 2.9.  Cuvette Lot#: [id number].  Exp: 11/2011.    Anticoagulation Management Assessment/Plan:      The patient's current anticoagulation dose is Warfarin sodium 5 mg  tabs: take as directed - currently 2.5 mg except 5 mg on thurs.  The target INR is 2 - 3.  The next INR is due 12/12/2010.  Anticoagulation instructions were given to patient.  Results were reviewed/authorized by Windell Hummingbird, RN.  She was notified by Windell Hummingbird, RN.         Prior Anticoagulation Instructions: INR 3.1  Skip tomorrow's dosage of Coumdin, then resume same dosage 1/2 tablet daily except 1 tablet on Mondays and Thursdays.  Recheck in 4 weeks.    Current Anticoagulation Instructions: INR 2.9 Continue taking 1/2 tablet every day except take 1 tablet on Mondays and Thursdays. Recheck in 4 weeks.

## 2010-11-29 ENCOUNTER — Telehealth: Payer: Self-pay | Admitting: Internal Medicine

## 2010-11-29 NOTE — Assessment & Plan Note (Signed)
Summary: CONGESTION/ SORE THROAT/NWS   Vital Signs:  Patient profile:   77 year old female Height:      65.5 inches Weight:      164.50 pounds BMI:     27.06 O2 Sat:      93 % on Room air Temp:     98 degrees F oral Pulse rate:   81 / minute BP sitting:   160 / 64  (left arm) Cuff size:   regular  Vitals Entered By: Zella Ball Ewing CMA Duncan Dull) (November 21, 2010 4:26 PM)  O2 Flow:  Room air CC: Congestion, sore throat, eye drainage and cough/RE   Primary Care Provider:  Oliver Barre, MD  CC:  Congestion, sore throat, and eye drainage and cough/RE.  History of Present Illness: here with acute visit with 3 days onset fever, facial pain, pressure, and greenish d/c;  Pt denies CP, worsening sob, doe, wheezing, orthopnea, pnd, worsening LE edema, palps, dizziness or syncope  Pt denies new neuro symptoms such as headache, facial or extremity weakness  Pt denies polydipsia, polyuria  Overall good compliance with meds, trying to follow low chol diet, wt stable, little excercise however .  No recent wt loss, night sweats, loss of appetite or other constitutional symptoms .  No longer taking bisphonate for osteoporosis, and only taking the calcium/vit d sporadically. Denies worsening depressive symptoms, suicidal ideation, or panic.    Problems Prior to Update: 1)  Coumadin Therapy  (ICD-V58.61) 2)  Flatulence-gas-bloating  (ICD-787.3) 3)  Hematochezia  (ICD-578.1) 4)  Abnormal Findings Gi Tract  (ICD-793.4) 5)  Abnormal Exam-biliary Tract  (ICD-793.3) 6)  Abdominal Pain-periumbilical  (ICD-789.05) 7)  Abdominal Pain-epigastric  (ICD-789.06) 8)  Weight Loss-abnormal  (ICD-783.21) 9)  Nausea  (ICD-787.02) 10)  Abdominal Pain, Generalized  (ICD-789.07) 11)  Preventive Health Care  (ICD-V70.0) 12)  Frequency, Urinary  (ICD-788.41) 13)  Dizziness, Chronic  (ICD-780.4) 14)  Hematochezia  (ICD-578.1) 15)  Overactive Bladder  (ICD-596.51) 16)  Colonic Polyps, Hx of  (ICD-V12.72) 17)  Skin Lesion   (ICD-709.9) 18)  Facial Pain  (ICD-784.0) 19)  Coronary Artery Disease  (ICD-414.00) 20)  Frequency, Urinary  (ICD-788.41) 21)  Cellulitis, Leg, Left  (ICD-682.6) 22)  Headache  (ICD-784.0) 23)  Facial Pain  (ICD-784.0) 24)  Chronic Rhinitis  (ICD-472.0) 25)  Sinusitis- Acute-nos  (ICD-461.9) 26)  Fatigue  (ICD-780.79) 27)  Uri  (ICD-465.9) 28)  Dysuria  (ICD-788.1) 29)  Sinusitis- Acute-nos  (ICD-461.9) 30)  Hematochezia  (ICD-578.1) 31)  Viral Infection  (ICD-079.99) 32)  COPD  (ICD-496) 33)  Pulmonary Hypertension, Secondary  (ICD-416.8) 34)  Dyspnea  (ICD-786.09) 35)  Headache  (ICD-784.0) 36)  Gait Imbalance  (ICD-781.2) 37)  Unspecified Hearing Loss  (ICD-389.9) 38)  Sinusitis- Acute-nos  (ICD-461.9) 39)  Osteoarthritis, Knee, Right  (ICD-715.96) 40)  Carotid Artery Stenosis, Right  (ICD-433.10) 41)  Diverticulosis, Colon  (ICD-562.10) 42)  Irritable Bowel Syndrome, Hx of  (ICD-V12.79) 43)  Common Migraine  (ICD-346.10) 44)  Depression  (ICD-311) 45)  Cyst/pseudocyst, Pancreas  (ICD-577.2) 46)  Constipation, Chronic, Hx of  (ICD-V12.79) 47)  Hyperlipidemia  (ICD-272.4) 48)  Allergic Rhinitis  (ICD-477.9) 49)  Pulmonary Embolism, Hx of  (ICD-V12.51) 50)  Anxiety  (ICD-300.00) 51)  Transient Ischemic Attack, Hx of  (ICD-V12.50) 52)  Osteoporosis  (ICD-733.00) 53)  Hypertension  (ICD-401.9) 54)  Gerd  (ICD-530.81)  Medications Prior to Update: 1)  Ecotrin Low Strength 81 Mg  Tbec (Aspirin) .[name].. 1 By Mouth Daily 2)  Zantac  150 Maximum Strength 150 Mg Tabs (Ranitidine Hcl) .[name].. 1po Two Times A Day 3)  Warfarin Sodium 5 Mg  Tabs (Warfarin Sodium) .... Take As Directed - Currently 2.5 Mg Except 5 Mg On Thurs 4)  Losartan Potassium 100 Mg Tabs (Losartan Potassium) .[name].. 1 By Mouth Once Daily 5)  Claritin 10 Mg Tabs (Loratadine) .... Take One By Mouth Once Daily 6)  Nasonex 50 Mcg/act Susp (Mometasone Furoate) .... 2 Spray/side Once Daily As Needed 7)  Amlodipine Besylate  2.5 Mg Tabs (Amlodipine Besylate) .[name].. 1po Once Daily 8)  Proair Hfa 108 (90 Base) Mcg/act Aers (Albuterol Sulfate) .... 2 Puffs Every 4 To 6 Hours As Needed 9)  Tums 500 Mg Chew (Calcium Carbonate Antacid) .... As Needed 10)  Promethazine Hcl 25 Mg Tabs (Promethazine Hcl) .... Take 1/2 Tab Every 6-8 Hours As Needed For Nausea 11)  Papaya Enzyme  Chew (Digestive Enzymes) .... Chew 2 Tabs With Meals As Needed  Current Medications (verified): 1)  Ecotrin Low Strength 81 Mg  Tbec (Aspirin) .[name].. 1 By Mouth Daily 2)  Zantac 150 Maximum Strength 150 Mg Tabs (Ranitidine Hcl) .[name].. 1po Two Times A Day 3)  Warfarin Sodium 5 Mg  Tabs (Warfarin Sodium) .... Take As Directed - Currently 2.5 Mg Except 5 Mg On Thurs 4)  Losartan Potassium 100 Mg Tabs (Losartan Potassium) .[name].. 1 By Mouth Once Daily 5)  Claritin 10 Mg Tabs (Loratadine) .... Take One By Mouth Once Daily 6)  Nasonex 50 Mcg/act Susp (Mometasone Furoate) .... 2 Spray/side Once Daily As Needed 7)  Amlodipine Besylate 5 Mg Tabs (Amlodipine Besylate) .[name].. 1po Once Daily 8)  Proair Hfa 108 (90 Base) Mcg/act Aers (Albuterol Sulfate) .... 2 Puffs Every 4 To 6 Hours As Needed 9)  Tums 500 Mg Chew (Calcium Carbonate Antacid) .... As Needed 10)  Promethazine Hcl 25 Mg Tabs (Promethazine Hcl) .... Take 1/2 Tab Every 6-8 Hours As Needed For Nausea 11)  Papaya Enzyme  Chew (Digestive Enzymes) .... Chew 2 Tabs With Meals As Needed 12)  Levofloxacin 250 Mg Tabs (Levofloxacin) .[name].. 1po Once Daily  Allergies (verified): 1)  ! Sulfa 2)  ! * Evista 3)  ! * Scallops 4)  Augmentin 5)  * Omnaris  Past History:  Past Medical History: Last updated: 07/03/2010 PULMONARY HYPERTENSION, SECONDARY (ICD-416.8) GAIT IMBALANCE (ICD-781.2) UNSPECIFIED HEARING LOSS (ICD-389.9) SINUSITIS- ACUTE-NOS (ICD-461.9) OSTEOARTHRITIS, KNEE, RIGHT (ICD-715.96) CAROTID ARTERY STENOSIS, RIGHT (ICD-433.10) DIVERTICULOSIS, COLON (ICD-562.10) IRRITABLE BOWEL SYNDROME, HX OF  (ICD-V12.79) COMMON MIGRAINE (ICD-346.10) DEPRESSION (ICD-311) PANCREATIC CYST     CEA=23.8, amylase < 30, cytology negative in 2004 CONSTIPATION, CHRONIC, HX OF (ICD-V12.79) HYPERLIPIDEMIA (ICD-272.4) ALLERGIC RHINITIS (ICD-477.9) PULMONARY EMBOLISM, HX OF (ICD-V12.51) ANXIETY (ICD-300.00) TRANSIENT ISCHEMIC ATTACK, HX OF (ICD-V12.50) OSTEOPOROSIS (ICD-733.00) HYPERTENSION (ICD-401.9) GERD (ICD-530.81) Allergic rhinitis Hyperlipidemia Coronary artery disease- by coronary ca+2 on CT chest 07/2008 Colonic polyps, hx of hyperplastic OAB DVT Esophageal Stricture  Pulmonary embloism  Internal/external hemorrhoids  Past Surgical History: Last updated: 08/05/2007 Hysterectomy egd 1999 Cholecystectomy Tonsillectomy Appendectomy s/p sinus surgury  Social History: Last updated: 06/06/2010 Never Smoked Alcohol use-no Drug use-no Retired Widowed Daily Caffeine Use  Risk Factors: Smoking Status: never (10/17/2007)  Review of Systems       all otherwise negative per pt -    Physical Exam  General:  alert and overweight-appearing.   Head:  normocephalic and atraumatic.   Eyes:  vision grossly intact, pupils equal, and pupils round.   Ears:  bilat tm's red, sinus tedner bilat maxiallary  Nose:  nasal dischargemucosal pallor and mucosal edema.   Mouth:  pharyngeal erythema and fair dentition.   Neck:  supple and no masses.   Lungs:  normal respiratory effort and normal breath sounds.   Heart:  normal rate and regular rhythm.   Extremities:  no edema, no erythema  Psych:  not depressed appearing and slightly anxious.     Impression & Recommendations:  Problem # 1:  SINUSITIS- ACUTE-NOS (ICD-461.9)  Her updated medication list for this problem includes:    Nasonex 50 Mcg/act Susp (Mometasone furoate) .[name]... 2 spray/side once daily as needed    Levofloxacin 250 Mg Tabs (Levofloxacin) .[name]... 1po once daily treat as above, f/u any worsening signs or symptoms    Instructed on treatment. Call if symptoms persist or worsen.   Problem # 2:  OSTEOPOROSIS (ICD-733.00)  d/w pt - to cont calcium/vit d supplement., would be good candidate for prolia - will have office look into copay and call pt with info, to see if this can be started, pt agrees after most recent dxa reviewed with pt  Problem # 3:  HYPERTENSION (ICD-401.9)  Her updated medication list for this problem includes:    Losartan Potassium 100 Mg Tabs (Losartan potassium) .[name]... 1 by mouth once daily    Amlodipine Besylate 5 Mg Tabs (Amlodipine besylate) .[name]... 1po once daily  BP today: 160/64 Prior BP: 140/80 (10/16/2010)  Labs Reviewed: K+: 4.9 Hemolyzed mEq/L (05/24/2010) Creat: : 0.6 (05/24/2010)   Chol: 230 (04/06/2009)   HDL: 63.70 (04/06/2009)   LDL: DEL (07/15/2008)   TG: 113.0 (04/06/2009) uncontrolled- to increase the amlodipine to 5 mg  Complete Medication List: 1)  Ecotrin Low Strength 81 Mg Tbec (Aspirin) .[name].. 1 by mouth daily 2)  Zantac 150 Maximum Strength 150 Mg Tabs (Ranitidine hcl) .[name].. 1po two times a day 3)  Warfarin Sodium 5 Mg Tabs (Warfarin sodium) .... Take as directed - currently 2.5 mg except 5 mg on thurs 4)  Losartan Potassium 100 Mg Tabs (Losartan potassium) .[name].. 1 by mouth once daily 5)  Claritin 10 Mg Tabs (Loratadine) .... Take one by mouth once daily 6)  Nasonex 50 Mcg/act Susp (Mometasone furoate) .... 2 spray/side once daily as needed 7)  Amlodipine Besylate 5 Mg Tabs (Amlodipine besylate) .[name].. 1po once daily 8)  Proair Hfa 108 (90 Base) Mcg/act Aers (Albuterol sulfate) .... 2 puffs every 4 to 6 hours as needed 9)  Tums 500 Mg Chew (Calcium carbonate antacid) .... As needed 10)  Promethazine Hcl 25 Mg Tabs (Promethazine hcl) .... Take 1/2 tab every 6-8 hours as needed for nausea 11)  Papaya Enzyme Chew (Digestive enzymes) .... Chew 2 tabs with meals as needed 12)  Levofloxacin 250 Mg Tabs (Levofloxacin) .[name].. 1po once daily  Patient Instructions: 1)   Please take all new medications as prescribed - the antibiotic 2)  increase the amlodipine to 5 mg per day 3)  Continue all previous medications as before this visit  4)  You should be called in 1-2 wks about the prolia shot 5)  Please schedule a follow-up appointment in 6 months or sooner if needed Prescriptions: AMLODIPINE BESYLATE 5 MG TABS (AMLODIPINE BESYLATE) 1po once daily  #90 x 3   Entered and Authorized by:   Corwin Levins MD   Signed by:   Corwin Levins MD on 11/21/2010   Method used:   [facility]ically to        [facility]  [address] [phone]* (retail)  [address]       [place], [place]  [address]       Ph: [phone] or [phone]       Fax: [phone]   RxID:   [id number] LEVOFLOXACIN 250 MG TABS (LEVOFLOXACIN) 1po once daily  #10 x 0   Entered and Authorized by:   Corwin Levins MD   Signed by:   Corwin Levins MD on 11/21/2010   Method used:   [facility] [phone][facility] (retail)       [address]       [place], [place]  [address]       Ph: [phone] or [phone]       Fax: [phone]   RxID:   [id number]    Orders Added: 1)  Est. Patient Level IV [23762]

## 2010-11-30 ENCOUNTER — Encounter: Payer: Self-pay | Admitting: Internal Medicine

## 2010-12-01 ENCOUNTER — Encounter: Payer: Self-pay | Admitting: Internal Medicine

## 2010-12-08 ENCOUNTER — Encounter: Payer: Self-pay | Admitting: Emergency Medicine

## 2010-12-08 DIAGNOSIS — Z86711 Personal history of pulmonary embolism: Secondary | ICD-10-CM | POA: Insufficient documentation

## 2010-12-08 DIAGNOSIS — I2699 Other pulmonary embolism without acute cor pulmonale: Secondary | ICD-10-CM

## 2010-12-08 DIAGNOSIS — Z86718 Personal history of other venous thrombosis and embolism: Secondary | ICD-10-CM

## 2010-12-11 ENCOUNTER — Encounter (INDEPENDENT_AMBULATORY_CARE_PROVIDER_SITE_OTHER): Payer: Medicare Other

## 2010-12-11 ENCOUNTER — Encounter: Payer: Self-pay | Admitting: Cardiology

## 2010-12-11 DIAGNOSIS — I2699 Other pulmonary embolism without acute cor pulmonale: Secondary | ICD-10-CM

## 2010-12-11 DIAGNOSIS — Z7901 Long term (current) use of anticoagulants: Secondary | ICD-10-CM

## 2010-12-14 NOTE — Medication Information (Signed)
Summary: [employer] Benefits  [employer] Benefits   Imported By: Lester  12/07/2010 09:09:20  _____________________________________________________________________  External Attachment:    Type:   Image     Comment:   External Document

## 2010-12-14 NOTE — Progress Notes (Signed)
Summary: [employer]  Phone Note Outgoing Call   Summary of Call: faxed ins verification req to [employer]......Marland Kitchenwill wait for benefit summary. Initial call taken by: Lanier Prude, [facility]),  November 29, 2010 12:09 PM  Follow-up for Phone Call        Rec benefit summary stating pt will owe $0 OOP. I called pt and advised of above. She states she is having eye surgery next tue and will call us back afterwards to schedule inj. Follow-up by: Lanier Prude, [facility]),  December 05, 2010 6:04 PM  Additional Follow-up for Phone Call Additional follow up Details #1::        noted Additional Follow-up by: Corwin Levins MD,  December 05, 2010 8:27 PM

## 2010-12-19 NOTE — Medication Information (Signed)
Summary: rov/pc   Anticoagulant Therapy  Managed by: Bayard Hugger, PharmD PCP: Oliver Barre, MD Supervising MD: Daleen Squibb MD, Maisie Fus Indication 1: Pulmonary Embolism and Infarction (ICD-415.1) Lab Used: [facility] Site: [facility] INR POC 2.0 INR RANGE 2 - 3  Dietary changes: no    Health status changes: no    Bleeding/hemorrhagic complications: no    Recent/future hospitalizations: no    Any changes in medication regimen? no    Recent/future dental: no  Any missed doses?: no       Is patient compliant with meds? yes       Allergies: 1)  ! Sulfa 2)  ! * Evista 3)  ! * Scallops 4)  Augmentin 5)  * Omnaris  Anticoagulation Management History:      Positive risk factors for bleeding include an age of 26 years or older and history of GI bleeding.  The bleeding index is 'intermediate risk'.  Positive CHADS2 values include History of HTN and Age > 49 years old.  The start date was 06/19/2007.  Her last INR was 2.4 ratio.  Anticoagulation responsible provider: Daleen Squibb MD, Maisie Fus.  INR POC: 2.0.  Cuvette Lot#: [id number].  Exp: 11/2011.    Anticoagulation Management Assessment/Plan:      The patient's current anticoagulation dose is Warfarin sodium 5 mg  tabs: take as directed - currently 2.5 mg except 5 mg on thurs.  The target INR is 2 - 3.  The next INR is due 01/08/2011.  Anticoagulation instructions were given to patient.  Results were reviewed/authorized by Bayard Hugger, PharmD.         Prior Anticoagulation Instructions: INR 2.9 Continue taking 1/2 tablet every day except take 1 tablet on Mondays and Thursdays. Recheck in 4 weeks.  Current Anticoagulation Instructions: INR 2.0 The patient is to continue with the same dose of coumadin.  This dosage includes: 1 tablet (5mg ) on Mon and Thu 0.5 tablet (2.5mg ) on the rest of the days Recheck INR in 4 weeks.

## 2010-12-19 NOTE — Letter (Signed)
Summary: [facility]  [facility]   Imported By: Sherian Rein 12/15/2010 12:47:14  _____________________________________________________________________  External Attachment:    Type:   Image     Comment:   External Document

## 2010-12-20 LAB — PANC CYST FLD ANLYS-PATHFNDR-TG

## 2010-12-30 DIAGNOSIS — B9789 Other viral agents as the cause of diseases classified elsewhere: Secondary | ICD-10-CM

## 2011-01-02 ENCOUNTER — Encounter: Payer: Medicare Other | Admitting: *Deleted

## 2011-01-03 ENCOUNTER — Ambulatory Visit (INDEPENDENT_AMBULATORY_CARE_PROVIDER_SITE_OTHER): Payer: Medicare Other | Admitting: *Deleted

## 2011-01-03 ENCOUNTER — Encounter: Payer: Medicare Other | Admitting: *Deleted

## 2011-01-03 DIAGNOSIS — Z86718 Personal history of other venous thrombosis and embolism: Secondary | ICD-10-CM

## 2011-01-03 DIAGNOSIS — Z7901 Long term (current) use of anticoagulants: Secondary | ICD-10-CM | POA: Insufficient documentation

## 2011-01-03 DIAGNOSIS — I2699 Other pulmonary embolism without acute cor pulmonale: Secondary | ICD-10-CM

## 2011-01-03 LAB — POCT INR: INR: 2.3

## 2011-01-03 NOTE — Patient Instructions (Addendum)
INR 2.3 Continue taking 1/2 tablet (2.5mg ) daily, except take 1 tablet on Mondays and Thursdays. Recheck in 4 weeks.

## 2011-01-04 ENCOUNTER — Ambulatory Visit: Payer: Medicare Other | Admitting: Internal Medicine

## 2011-01-08 ENCOUNTER — Encounter: Payer: Medicare Other | Admitting: *Deleted

## 2011-01-31 ENCOUNTER — Ambulatory Visit (INDEPENDENT_AMBULATORY_CARE_PROVIDER_SITE_OTHER): Payer: Medicare Other | Admitting: *Deleted

## 2011-01-31 DIAGNOSIS — Z86718 Personal history of other venous thrombosis and embolism: Secondary | ICD-10-CM

## 2011-01-31 DIAGNOSIS — I2699 Other pulmonary embolism without acute cor pulmonale: Secondary | ICD-10-CM

## 2011-01-31 LAB — POCT INR: INR: 3.4

## 2011-02-02 ENCOUNTER — Telehealth: Payer: Self-pay | Admitting: Emergency Medicine

## 2011-02-02 NOTE — Telephone Encounter (Signed)
I spoke to [facility] and they state that everything looks good on their end and that they have all info they need from our office for the pt oxygen. They state they advised the pt to call Medicare billing to ask them what is needed. So I called the pt and she states she did call Medicare and they told her to call [employer]. She Also states that they told her [employer] has a division that works on this sort of issue and for her to not worry about the notice she received. She states she will let [employer] handle it and have them contact us if anything is needed in the future.Carron Curie, CMA

## 2011-02-20 NOTE — Discharge Summary (Signed)
NAMETAGAN, BARTRAM             ACCOUNT NO.:  [id number]   MEDICAL RECORD NO.:  [id number]          PATIENT TYPE:  INP   LOCATION:  1436                         FACILITY[facility]  [facility]   PHYSICIAN:  Rosalyn Gess. Norins, MD  DATE OF BIRTH:  09-05-1930   DATE OF ADMISSION:  06/28/2007  DATE OF DISCHARGE:  07/01/2007                               DISCHARGE SUMMARY   ADMITTING DIAGNOSIS:  Right-sided chest pain.   DISCHARGE DIAGNOSES:  1. Pulmonary emboli.  2. Right middle lobe syndrome.  3. Hypertension.   CONSULTANT:  Dr. Francella Solian for pulmonary.   PROCEDURE:  The patient's CT angio revealed patchy nodular infiltrate of  the right middle lobe chronic linear septated filling defect in the  right lower lobe pulmonary artery and small linear filling defect in the  branch of the left lower lobe artery which may represent acute PE.   HISTORY OF PRESENT ILLNESS:  Patient is a 77 year old woman with a  history of hypertension and right partial lung collapse eight years ago.  She has been followed by pulmonary in the past for right middle lobe  syndrome.  The patient presented on the day of admission complaining of  chest pain and discomfort on and off.  She also had some shortness of  breath.  She has no history of malignancy.  Mammograms and screening  colonoscopy have been negative.  She has no history of ovarian or  uterine cancer.  She has a history of previous PE.  On evaluation in the  emergency room, EKG was unremarkable.  CT angio suggested PE in the  right lower lobe and left lower lobe supply, could not determine whether  this was chronic versus acute and subsequently the patient was admitted  for anticoagulation.  Please see the H&P for past medical history,  family history and admitting exam.   HOSPITAL COURSE:  PROBLEM #1 -  PULMONARY EMBOLISM:  Patient with  chronic versus acute PE.  Her symptoms and discomfort favor the acute  phase.  The patient had anticoagulation panel  which was negative with  normal antithrombin III, normal protein S, mildly elevated protein C,  negative lupus anticoagulant, normal homocystine level.  The patient had  been seen in consultation by the pulmonary service who felt that she did  need further evaluation as an inpatient for right middle lobe syndrome.  With the patient having been evaluated and having started on  anticoagulation therapy at this point the obstacle of the discharge is  coverage for Lovenox as an outpatient.  If this can be arranged and  covered so the patient may afford it, she is ready for discharge home  and will be followed in the [facility].   PHYSICAL EXAMINATION:  Examination on July 01, 2007 at 0930 hours  VITAL SIGNS:  Temperature 97.8, blood pressure 122/51, pulse 54,  respirations 18, O2 sats 92% on room air.  GENERAL APPEARANCE:  This is a well-nourished woman in no acute  distress.  CHEST:  Patient is moving air well.  There is no rales, wheezes or  rhonchi.  CARDIOVASCULAR:  2+ radial pulse.  She had no JVD or carotid bruits.  ABDOMEN:  Soft, no further exam conducted.   FINAL LABORATORY:  INR as of today was 1.4 and subtherapeutic.  Coag  panel was noted.  Final chemistries June 30, 2007, with normal  electrolytes, BUN of 8, creatinine 0.6, glucose of 106.  Cardiac enzymes  were negative x3.  Sedimentation rate was elevated at 91.   DISCHARGE MEDICATIONS:  The patient would be discharged on her previous  home medications including  1. Protonix 40 mg daily.  2. Aspirin will be discontinued.  3. Hydrochlorothiazide 25 mg daily.  4. The patient has been switched to Avelox p.o. and will continue this      for an additional 2 days, although there is no evidence of active      infection.  5. Patient will need Lovenox 80 mg subcu q.12h.  6. Coumadin will be continued at recommended dose by pharmacy.  The      patient will follow-up with the coag clinic as  noted.   The patient's condition at time of this dictation is stable.  Anticipate  discharge pending coverage of Lovenox as an outpatient.      Rosalyn Gess Norins, MD  Electronically Signed     MEN/MEDQ  D:  07/01/2007  T:  07/01/2007  Job:  [id number]   cc:   Corwin Levins, MD  [address]  [place]  [place] [address]   Shelby Dubin, PharmD, BCPS, CPP  [address] [place], [place] [address]

## 2011-02-20 NOTE — H&P (Signed)
[name], [name]             ACCOUNT NO.:  [id number]   MEDICAL RECORD NO.:  [id number]          PATIENT TYPE:  INP   LOCATION:  1436                         FACILITY:  [facility]   PHYSICIAN:  Valerie A. Felicity Coyer, MDDATE OF BIRTH:  1930-09-16   DATE OF ADMISSION:  06/28/2007  DATE OF DISCHARGE:                              HISTORY & PHYSICAL   CHIEF COMPLAINT:  Right-sided chest pain with inspiration.   HISTORY OF PRESENT ILLNESS:  A 77 year old white female with history of  hypertension and reported right partial lung collapse 8 years ago, who  presents to the [facility] emergency room today, due to on and off  right-sided chest pain over the last few days.  This chest pain is  located in a finger spot-type location, slightly above and lateral to  her right breast and radiates through to her back from the anterior side  of her chest.  There is no arm pain, no neck pain.  In the last several  days, this has been on and off, of short duration, less than a couple of  minutes at a time and rated at a 4 out of  6 out of 10, but would  resolve spontaneously.  This morning, however, after rising and  dressing, she had a sudden recurrence of 8/10 type chest pain, causing  her to lose her breath.  The pain was especially worse with inspiration  and reminded the patient of the symptoms she had during her lung  collapse years ago.  She has no known history of malignancy.  Mammograms  have been negative, and a screening colonoscopy was negative for  abnormality in 2004, per her report.  No history of ovarian or uterine  cancer.  No history of previous clots.  No known cause for previous  reported lung collapse.  She does not smoke, and she has not had any leg  swelling.  Please see review of systems below, for further details.  On  evaluation in the emergency room, EKG was unremarkable for ischemic  abnormalities and due to the pleuritic nature, a CT chest angio was  performed which showed  bilateral pulmonary embolism in the right lower  lobe supply and left lower lobe supply, but appearance was more  consistent with that of chronic versus acute emboli.  There was also  nodular and patchy changes in the right middle lobe, consistent with  infiltrate.  She is now referred for admission for anticoagulation and  further workup.   PAST MEDICAL HISTORY:  1. Significant for GERD for which she takes proton pump inhibitor as      needed, which has been daily.  2. Hypertension.  3. History of TIA in 1990, takes aspirin 81 mg daily.  4. History of lung collapse 7 to 10 years ago from an unknown cause,      treatment by Dr. Sherene Sires.  5. History of hospitalization at [facility], due to vertigo.  6. Status post cholecystectomy, with a negative ERCP in April 2004.  7. Status post appendectomy.  8. Status post total abdominal hysterectomy, due to endometriosis.  She reports she has one ovary that remains from that surgery.   MEDICATIONS:  Include:  1. Protonix 40 mg daily as needed.  2. Aspirin 81 mg daily (notes noncompliance, as she forgets).  3. Blood pressure pill, presumably hydrochlorothiazide 1 tablet daily.   ALLERGIES:  INCLUDE SULFA WHICH CAUSES HER TONGUE TO SWELL.   FAMILY HISTORY:  Her mother died at age 15 due to angina, and her father  died in his 51s due to recurrent stroke and complications.   SOCIAL HISTORY:  She does not smoke but had exposure to secondhand smoke  due her husband, until his death in 77.  Her husband died at age 45,  due to lung and breast cancer.  No alcohol use.  Lives alone and is  independent with activities of daily living.   REVIEW OF SYSTEMS:  No fevers or chills.  Positive dyspnea on exertion,  especially while climbing chills or extraneous-type heavy work.  No  cough, sputum, blood or hoarseness.  No weight changes.  No nausea,  vomiting or diarrhea but positive for occasional constipation and lower  extremity swelling at this  time.  Does note occasional ankle edema  bilaterally, especially on trips but none present now.  No recent travel  but does note a car trip to [place] last month in August, otherwise  negative system review.  Please see HPI above for other details.   PHYSICAL EXAM:  VITAL SIGNS:  Temperature 97.8, blood pressure 140/70,  pulse of 76, respirations 16, satting 96% on room air.  GENERAL:  She is a pleasant, appropriate age, older woman who is awake,  alert, oriented x4 and in no acute distress.  HEENT:  Unremarkable.  LUNGS:  Clear to auscultation bilaterally, anterior and posteriorly.  No  wheeze, crackle or rhonchi.  There is no increased work of breathing or  distress.  CARDIOVASCULAR:  Exam is regular rate and rhythm without murmurs, rubs  or gallops.  ABDOMEN:  Soft, nontender, good bowel sounds with a protuberant stomach,  no rebound or guarding.  EXTREMITIES:  Show no edema, no swelling, no erythema, minimal  varicosities.  NEUROLOGICAL:  She is grossly intact.   LABORATORY DATA:  With a normal CBC, with white count of 7.5, hemoglobin  of 14.4,  platelets of 278.  Basic metabolic generally unremarkable, a  potassium at 3.4.  Point of care enzymes:  Cardiac negative x1.  CT  chest shows a pulmonary artery filling defect in the right lower lobe  and left lower lobe, question acute versus chronic PE, also nodular  patchy right middle lobe changes.  Chest x-ray shows a persistent right  upper lobe nodule, CT above regarding this.  EKG shows normal sinus  rhythm and 77 beats per minute, with nonspecific ST changes.   ASSESSMENT/PLAN:  1. Right-sided chest pain with bilateral pulmonary embolism.  Question      if these are acute or chronic.  These emboli are likely to be the      source of the patient's right-sided chest complaints with pleurisy,      but will also cycle cardiac enzymes to rule out MI and monitor on      tele.  Will at this time begin full-dose Lovenox  anticoagulation      and question of the patient is a candidate for the [place] study.      If she is felt not to be so, will begin Coumadin for long-term oral      anticoagulation,  in addition to Lovenox begun today.  She has no      known risk for a hypercoagulable disease including no previous      history of clots, no known cancer, no trauma or surgery.  Will      check hypercoagulable panel and bilateral lower extremities to rule      out DVT.  No clinical hypoxia, tachycardia and hemodynamically      stable, but again note pleurisy, for which we will treat with      Vicodin p.r.n., if this has provided good relief.  Will ask      pulmonary to see, not only regarding Luisa Hart but changes of      right middle lobe.  Please see problem number next.  2. Right middle lobe, question airspace disease on CT.  Question if      this is an infarct related to her emboli.  There are no clinical      symptoms or signs of pneumonia, specifically no cough, sputum,      fever or leukocytosis.  No sick contacts.  Question if this is      chronic scarring due to her previous lung process involving      partial lung collapse 7 to 8 years ago.  Will check a sed rate      and plan to pursue outpatient, old records from Dr. Sherene Sires, her      pulmonologist who treated her for same.  At that time, no details      available in E-chart at this time.  Will hold antibiotics for this      time, as clinically again no evidence of infection or pneumonia.      Pulmonary will see to help sort out this question.  3. History of hypertension.  Will continue her home medication,      presumably HCTZ, with this to be verified per      home medicine reconciliation.  4. Mild hypokalemia, less likely due to suspected diuretic use.  Will      replace and re-check in the morning.  5. Gastroesophageal reflux disease history.  Continue home proton pump      inhibitor.      Valerie A. Felicity Coyer, MD  Electronically  Signed     VAL/MEDQ  D:  06/28/2007  T:  06/29/2007  Job:  [id number]

## 2011-02-20 NOTE — Assessment & Plan Note (Signed)
[facility]                             PULMONARY OFFICE NOTE   NAME:[name], [name]                    MRN:          [id number]  DATE:07/10/2007                            DOB:          1930-09-08    HISTORY:  A 77 year old white female admitted to [facility] June 28, 2007 through July 01, 2007 with a diagnosis of acute pulmonary  embolism with right-sided pleuritic pain.  She was also felt to have  possible right middle lobe syndrome.  She improved during the  hospitalization, and was discharged early on Lovenox and Coumadin, and  actually had excessive anticoagulation at 1 (she reports her INR was up  to 5).  The Coumadin was tapered, and now comes in with a complaint that  over the last 2 days she has had increasing left anterior pleuritic pain  that is similar to what she had on the right when she had a pulmonary  embolism.  She was seen in the Coumadin Clinic already today with a INR  of only 1.9, and placed back on Lovenox, and sent here for evaluation.   The patient denies dyspnea any worse than baseline.  That is, she  improved during her hospitalization, and has not lost ground despite  the new onset of left pleuritic pain.  The pain is below her left nipple  and radiates to her left posterior chest.  She denies any cough or  fever, increased leg swelling (note, she had negative venous Dopplers at  the time of her hospitalization).   PAST MEDICAL HISTORY:  Significant for:  1. Hypertension.  2. Clinical GERD.  3. Obesity.   ALLERGIES:  NONE KNOWN.   MEDICATIONS:  Taken in detail on the worksheet, correct and accommodated  July 10, 2007.   PHYSICAL EXAMINATION:  This is an mildly obese ambulatory white female  in no acute distress.  She is afebrile with stable vital signs except for a saturation that 86%  when it was first checked, and then 94% on repeat.  HEENT:  Unremarkable.  Oropharynx clear.  LUNG FIELDS:   Actually clear bilaterally to auscultation and percussion.  She did not have any reproducible splinting on deep inspiratory  maneuvers today, nor cough on inspiration.  HEART:  Regular rhythm with no increase in P2 or tachycardia.  ABDOMEN:  Soft and benign with no palpable organomegaly, mass, or  tenderness.  EXTREMITIES:  Warm without calf tenderness, cyanosis, clubbing, or  edema.   Chest CT scan was repeated, and shows no gross evidence of pulmonary  embolism or pleural effusion.   IMPRESSION:  She may very well have very small peripheral emboli that  are breaking up causing 1 of the 4 syndromes of pulmonary embolism,  namely pleuritic pain based on small peripheral infarcts.  The timing is  suspicious for it because she had subtherapeutic Coumadin for a few  days.  However, this is considered a relatively low clot burden to the  point where she actually is not significantly short of breath.  The main  concern is that she might develop small pleural effusion in  the area or  infarction or infection.   I see none of these obvious complications, however, on CT scan today,  and therefore, simply recommended tramadol 50 mg 1 every 4 hours p.r.n.  pain, and asked her to continue with her Coumadin therapy per our  Coumadin Clinic.     Charlaine Dalton. Sherene Sires, MD, [facility]  Electronically Signed    MBW/MedQ  DD: 07/10/2007  DT: 07/11/2007  Job #: [id number]

## 2011-02-21 ENCOUNTER — Ambulatory Visit (INDEPENDENT_AMBULATORY_CARE_PROVIDER_SITE_OTHER): Payer: Medicare Other | Admitting: *Deleted

## 2011-02-21 DIAGNOSIS — I2699 Other pulmonary embolism without acute cor pulmonale: Secondary | ICD-10-CM

## 2011-02-21 DIAGNOSIS — Z86718 Personal history of other venous thrombosis and embolism: Secondary | ICD-10-CM

## 2011-02-23 NOTE — Op Note (Signed)
   NAME:  [name], [name]                       ACCOUNT NO.:  [id number]   MEDICAL RECORD NO.:  [id number]                   Aguirre TYPE:  AMB   LOCATION:  DSC                                  FACILITY:  [facility]   PHYSICIAN:  Robert A. Thurston Hole, M.D.              DATE OF BIRTH:  05-10-1930   DATE OF PROCEDURE:  04/05/2003  DATE OF DISCHARGE:                                 OPERATIVE REPORT   PREOPERATIVE DIAGNOSIS:  Right trigger thumb.   POSTOPERATIVE DIAGNOSIS:  Right trigger thumb.   PROCEDURE:  Right trigger thumb release.   SURGEON:  Elana Alm. Thurston Hole, M.D.   ASSISTANT:  Julien Girt, P.A.   ANESTHESIA:  IV regional.   OPERATIVE TIME:  15 minutes.   COMPLICATIONS:  None.   INDICATIONS FOR PROCEDURE:  [name] is a 77 year old woman who has had  painful recurrent right trigger thumb unrelieved by conservative care and is  now to undergo right trigger thumb release.   DESCRIPTION OF PROCEDURE:  [name] was brought to Tabitha operating room on  April 05, 2003, placed on Tabitha operative table in Tabitha supine position.  After  an adequate level of IV regional anesthesia was obtained, [name] right hand and  Aguirre were prepped using sterile DuraPrep and draped using sterile technique.  Initially, through a 1.5-cm transverse incision based at Tabitha thumb flexion  crease, initial exposure was made.  Tabitha underlying subcutaneous tissues were  incised along with skin incision.  Neurovascular bundles carefully protected  while [name] was exposed and incised longitudinally.  Tabitha  underlying flexor tendon was somewhat thickened and inflamed with moderate  tenosynovitis which was debrided.  After this release had been carried out,  no further triggering was noted and Tabitha thumb could be brought through a  full range of motion.  At this point, [name] was irrigated and closed  using 4-0 nylon suture and injected with  0.25% Marcaine.  Sterile dressings were applied.   Tourniquet was released  and [name] awakened and taken to Tabitha recovery room in stable condition.   FOLLOWUP CARE:  [name] will be followed as an outpatient on Darvocet  and Naprosyn.  I will see [name] back in Tabitha office in a week for a Aguirre check  and followup.                                               Robert A. Thurston Hole, M.D.    RAW/MEDQ  D:  04/05/2003  T:  04/05/2003  Job:  [id number]

## 2011-02-23 NOTE — Assessment & Plan Note (Signed)
[facility]                           GASTROENTEROLOGY OFFICE NOTE   NAME:Clevenger, ZAYANNA PUNDT                    MRN:          [id number]  DATE:04/18/2006                            DOB:          September 27, 1930    Ms. Luis complains of choking and coughing while drinking water and ice  tea.  These symptoms have been present for a month or two.  She has no  dyspeptic symptoms, and she has no problems with other liquids and foods.  Prior evaluation for GERD was negative with a 24-hour pH study in 2001 that  did not demonstrate GERD.  She has not had any response to a trial of Nexium  by Dr. Jonny Ruiz.  She continues to have constipation and hard pellet-like  stools.  She has not had any recent bleeding recently.  In addition, she had  a followup abdominal MRI on March 19, 2006 of her pancreatic cyst, and it  showed no change in the cystic lesion in the head of the pancreas over a 3-  year followup interval, which strongly supports a benign process.   MEDICATIONS:  Medications listed on the chart obtained and reviewed.   MEDICATION ALLERGIES:  SULFA DRUGS.   PHYSICAL EXAMINATION:  GENERAL:  In no acute distress.  VITAL SIGNS:  Weight 164.6 pounds.  Blood pressure is 152/64, pulse 64 and  regular.  HEENT:  Anicteric sclerae.  Oropharynx is clear.  NECK:  Without thyromegaly or adenopathy.  CHEST:  Clear to auscultation bilaterally.  CARDIAC:  Regular rate and rhythm without murmurs.  ABDOMEN:  Soft, nontender, nondistended.  Normoactive bowel sounds.  No  palpable organomegaly, masses or hernias.   ASSESSMENT AND PLAN:  1.  Oropharyngeal dysphagia.  She has a recent history of a TIA and may have      impaired swallowing function.  Obtain a speech pathology assisted      modified barium swallow.  She is advised to avoid thin liquids until      further evaluation.  May discontinue Nexium.  2.  Chronic constipation.  Increase milk of magnesia to 2 tablespoons  a day.      Continue adequate fluid intake.  Continue Benefiber.  3.  Benign pancreatic cyst.  Since this has been followed now for 3 years      without any interval change in the lesion, I think we can safely      conclude that this is a benign process and it does not need any further      imaging followup.                                   Venita Lick. Pleas Koch., MD, [facility]   MTS/MedQ  DD:  04/18/2006  DT:  04/18/2006  Job #:  [id number]   cc:   Corwin Levins, MD

## 2011-02-23 NOTE — Op Note (Signed)
[name], [name]             ACCOUNT NO.:  [id number]   MEDICAL RECORD NO.:  [id number]          PATIENT TYPE:  AMB   LOCATION:  ENDO                         FACILITY:  [facility]   PHYSICIAN:  Petra Kuba, M.D.    DATE OF BIRTH:  Sep 21, 1930   DATE OF PROCEDURE:  09/10/2006  DATE OF DISCHARGE:                               OPERATIVE REPORT   SURGEON:  Petra Kuba, M.D.   PROCEDURE:  Esophagogastroduodenoscopy with Savary dilatation.   INDICATIONS:  Patient with dysphagia, nondiagnostic modified barium  swallow, helped by dilatation in the past.  Consent was signed after  risks, benefits, methods and options were thoroughly discussed last week  and before any sedation.   MEDICINES USED:  Fentanyl 60 mcg, Versed 3 mg.   DESCRIPTION OF PROCEDURE:  The video endoscope was inserted by direct  vision.  The esophagus was normal.  In the distal esophagus was a small  hiatal hernia, without any obvious ring or stricture.  The scope  advanced into the stomach, advanced through a normal antrum, normal  pylorus, into a normal duodenal bulb, and around the C loop to a normal  second portion of the duodenum.  The scope was withdrawn back to the  bulb, and a good look there ruled out abnormalities in that location.  The scope was withdrawn back to the stomach and retroflexed.  The  angularis, cardia and fundus, lesser and greater curve were all normal  on retroflex visualization, except for the hiatal hernia being confirmed  in the cardia.  Straight visualization in the stomach did not reveal any  additional findings.  The scope was slowly withdrawn back to about 15 cm  in the upper esophageal sphincter, and, again, confirming a normal  esophagus.  The scope was reinserted into the stomach.  A Savary wire  was advanced.  The customary J loop was confirmed endoscopically.  The  scope was removed, advancing the wire with 1-to-1 motion.  The scope was  withdrawn.  The wire was at the 4 black  mark at the mouth, which usually  coordinates with the antrum, and the consistency J loop being in the  proper position.  At that point, the fluoro became available to [name], and  we confirmed the proper position in the stomach under fluoro.  We then  went ahead and proceeded with the Savary 14, and then 16-mm dilator over  the wire under fluoro in the customary fashion.  There were no  resistance or heme after advancing both dilators.  Once the 16 was  confirmed in the stomach, the wire was withdrawn back into the dilator.  Both were removed in tandem.  The procedure was terminated at this  juncture.  The patient tolerated the procedure well.  There was no  obvious immediate complication.   ENDOSCOPIC DIAGNOSES:  1. Small hiatal hernia.  2. Otherwise, normal esophagogastroduodenoscopy.   THERAPY:  Savary dilatation under fluoro to 16 mm.   PLAN:  See if the dilation works.  Follow up p.r.n., or in 6 weeks, and  call me sooner p.r.n., as above.  ______________________________  Petra Kuba, M.D.     MEM/MEDQ  D:  09/10/2006  T:  09/11/2006  Job:  [id number]   cc:   Corwin Levins, MD

## 2011-02-23 NOTE — Discharge Summary (Signed)
NAMERAYNE, COWDREY                       ACCOUNT NO.:  [id number]   MEDICAL RECORD NO.:  [id number]                   PATIENT TYPE:  INP   LOCATION:  0376                                 [facility]ACILITY:  [facility]   PHYSICIAN:  Rene Paci, M.D. [facility]          DATE OF BIRTH:  May 25, 1930   DATE OF ADMISSION:  01/22/2004  DATE OF DISCHARGE:  01/27/2004                                 DISCHARGE SUMMARY   DISCHARGE DIAGNOSES:  1. 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  2. Allergic sinusitis/rhinitis, on medications.  3. Hypertension, new diagnosis.  The patient is declining medication     treatment at this time.  4. History of transient ischemic attack in 1990.   DISCHARGE MEDICATIONS:  1. Antivert 25 mg p.o. t.i.d.  2. Allegra 180 mg p.o. daily.  3. Nasonex or other nasal steroid spray one puff every nostril q.a.m.  4. Prilosec OTC as needed for indigestion.   DISPOSITION:  The patient is discharged home where she will stay with her  daughter and receive home physical therapy for vestibular training until  symptoms resolve and she is able to return to her own home where she lives  independently.   FOLLOWUP:  With primary care physician.  The patient is to call for about  two weeks for followup, and also recheck blood pressure and discuss  initiation of blood pressure treatment.   CONDITION ON DISCHARGE:  Improved, but still mildly vertiginous.  Stable  condition.   HOSPITAL COURSE:  #1 -  SEVERE VERTIGO:  The patient is a 77 year old female  admitted with intractable vertigo associated with sinus headaches.  She had  tried low dose Antivert at home, as this had treated previous episodes of  vertigo, but she did not seem to improve with this, and because of worsening  headache, sought care in the emergency room.  CT scan was negative, and a  MRI/MRA were pursued.  These showed no acute  changes, only mild small vessel  disease with normal intracranial blood vessels.  The patient was reassured  by this.  She had initially been tried on Valium as Antivert as an  outpatient did not work, but this caused her to be too drowsy.  She was then  treated for her allergic sinusitis and allergy symptoms with Allegra plus  the initiation of nasal steroids.  A few changes in the resumption of higher  dosed Antivert.  The patient's symptoms did improve, though she felt unable  to return home.  She was evaluated by PT, thought to be too high functioning  to require any rehabilitation, so at this time she is going home with her  daughter to receive home health from [place] for vestibular training.  Hospital followup in two weeks with primary care physician to re-evaluate  symptoms and consider outpatient evaluation by ENT and/or neurology for her  symptoms.  #2 -  ALLERGIC RHINITIS/SINUSITIS:  The above improved on medications.  #3 -  HYPERTENSION:  The patient had consistently elevated blood pressures  during this admission, 160/70 on day of admission, 159/69 on day of  discharge.  She notes she has never had high blood pressure before, and  wanted to know how she could treat this without medications.  Discussed  lifestyle modifications of losing weight, increasing exercises.  I told her  she may yet require blood pressure medications, and given history of TIA  would suggest close  followup.  She will try this for two weeks and then re-evaluate her blood  pressure once out of the hospital, as she thinks this may also have  something to do with her elevated blood pressure.  The rest of her  initiation of treatment to primary care physician.                                               Rene Paci, M.D. [facility]    VL/MEDQ  D:  01/27/2004  T:  01/27/2004  Job:  [id number]

## 2011-02-23 NOTE — Procedures (Signed)
[place]. [facility]  Patient:    [name], [name]                      MRN: [id number] Proc. Date: 06/24/00 Adm. Date:  [id number] Disc. Date: [id number] Attending:  Starr Sinclair CC:         Venita Lick. Pleas Koch., M.D. [facility]                           Procedure Report  PROCEDURE: Esophageal manometry.  ENDOSCOPIST: Venita Lick. Pleas Koch., M.D.  RESULT: The patient tolerated the procedure well without difficulties.  UPPER ESOPHAGEAL SPHINCTER STUDY: There was a slight increase in duration at the upper esophageal sphincter and a slight increase in peak pressure in the pharynx.  The remainder of the upper esophageal sphincter and pharyngeal study was normal.  In the upper esophageal body 60% of the five swallows were peristaltic, 20% simultaneous, and 20% retrograde.  Lower esophageal body showed normal amplitude and borderline-normal peristalsis.  Eleven swallows were performed and 73% resulted in peristaltic activity on one tracing and 82% resulted in peristaltic activity on another tracing; the other swallows were either nontransmitted, retrograde, or simultaneous.  LOWER ESOPHAGEAL SPHINCTER STUDY: Resting pressure was 28.3 mmHg with residual pressure 3.0 mmHg, representing 86% relaxation.  IMPRESSION: Borderline abnormal motility study with 60% nonperistaltic contractions in the upper esophageal body.  Unfortunately, only five swallows were performed.  Borderline peristaltic activity in the lower esophageal body.  RECOMMENDATIONS: Nonspecific findings.  Plan for dietary adjustment and monitoring of symptoms. DD:  06/26/00 TD:  06/27/00 Job: [id number]

## 2011-02-23 NOTE — H&P (Signed)
[name], [name]                       ACCOUNT NO.:  [id number]   MEDICAL RECORD NO.:  [id number]                   PATIENT TYPE:  INP   LOCATION:  0103                                 FACILITY:  [facility]   PHYSICIAN:  Titus Dubin. Alwyn Ren, M.D. [facility]         DATE OF BIRTH:  1930-03-02   DATE OF ADMISSION:  01/22/2004  DATE OF DISCHARGE:                                HISTORY & PHYSICAL   PRIMARY CARE DOCTOR:  Corwin Levins, M.D.   Ms. [name] is a 77 year old white female admitted with intractable vertigo  and left occipital headache.  She had the acute onset April 12th a.m. with  frank vertigo.  She states that the room actually spun, and she was also  dizzy.  The vertigo was more severe than the symptoms.  Additionally, she  has had a left occipital headache which she describes as dull and constant  with extension to the temple.  The symptoms have also been associated with  nausea.  She actually vomited twice on April 15.  She was seen in the  emergency room for these symptoms on April 12 and treated with meclizine and  Phenergan, which has been of no benefit.  She was treated in the emergency  room and released for a similar episode one year ago which was less severe  and also with an episode 12 years ago while living in [place].   PAST MEDICAL HISTORY:  1. Cholecystectomy.  2. TIA in 1990.  3. Hypoglycemia, of a reactive nature.  4. Total abdominal hysterectomy for endometriosis.  5. Tonsillectomy, adenoidectomy, and appendectomy.  6. She had an ERCP in April, 2004 for abdominal pain.  A mildly dilated bile     duct was found.  7. She had a colonoscopy which revealed colon polyps and hemorrhoids a week     and a half ago.  She has been told to stay off her aspirin for two weeks     total.   Additional medications are Claritin and Allegra as needed.  Miacalcin nasal  spray.  Glucosamine.  Multivitamins.  Aspirin, which is being held.   She does not drink or smoke.  She  is widowed and has lived in [place] for  10 years.   SULFA causes glossal edema and oral blisters.  AUGMENTIN is an intolerance.   She has chronic tinnitus.  She has had some chills and sweats but no  localizing signs or symptoms of fever.  Specifically, she has had no sputum  production, diarrhea, dysuria, or pyuria.  She denies any chest pain or  palpitations.  She has arthritis, which she states is typical for her age.   PHYSICAL EXAMINATION:  VITAL SIGNS:  Blood pressure 161/70, pulse 65,  respiratory rate 18, O2 sats are 95%.  GENERAL:  She is in no acute distress at rest but with any movement, gets  profoundly dizzy.  HEENT:  Pupils are equal, round and reactive to  light.  She has resting  horizontal nystagmus.  Oropharynx and nares are unremarkable.  There is  increased cerumen on the right.  Left tympanic membrane is slightly dull.  There is no tenderness over the left occiput.  NECK:  She has no lymphadenopathy about the head, neck, or axilla.  Thyroid  is normal to palpation.  LUNGS:  Clear.  She was somewhat unsteady when she stood up for auscultation  of the chest.  HEART:  She has a Grade 1 systolic murmur with carotid radiation.  ABDOMEN:  She has no organomegaly or masses.  There is a suprapubic  operative scar.  EXTREMITIES:  Pedal pulses are intact.  There is no edema.  She has full  range of motion without localizing neurologic signs except for the  nystagmus.   She has an occipital headache and vertigo.  A CT scan on April 12 was  negative.   MRA and MRI will be necessary for clarification.  Problematic is the fact  that she cannot be anticoagulated because of the colonoscopy and removal of  polyps in the last week and a half.  Neurology will be consulted.  She will  receive Phenergan IV.  If this is inadequate, then Zofran will be  administered.   Extensive labs done on the 12th were basically negative.  At that time, her  blood pressure was 184/83  initially.  Her CBC and differential, BMET, and  urinalysis were negative.   As stated, she will be monitored on telemetry and orthostatics will be  checked.                                               Titus Dubin. Alwyn Ren, M.D. [facility]    [facility]  D:  01/22/2004  T:  01/22/2004  Job:  [id number]   cc:   Corwin Levins, M.D. [facility]

## 2011-03-01 ENCOUNTER — Other Ambulatory Visit (INDEPENDENT_AMBULATORY_CARE_PROVIDER_SITE_OTHER): Payer: Medicare Other | Admitting: Internal Medicine

## 2011-03-01 ENCOUNTER — Ambulatory Visit (INDEPENDENT_AMBULATORY_CARE_PROVIDER_SITE_OTHER): Payer: Medicare Other | Admitting: Internal Medicine

## 2011-03-01 ENCOUNTER — Other Ambulatory Visit (INDEPENDENT_AMBULATORY_CARE_PROVIDER_SITE_OTHER): Payer: Medicare Other

## 2011-03-01 ENCOUNTER — Encounter: Payer: Self-pay | Admitting: Internal Medicine

## 2011-03-01 VITALS — BP 140/72 | HR 82 | Temp 98.4°F | Ht 65.0 in | Wt 164.4 lb

## 2011-03-01 DIAGNOSIS — R5383 Other fatigue: Secondary | ICD-10-CM

## 2011-03-01 DIAGNOSIS — R5381 Other malaise: Secondary | ICD-10-CM

## 2011-03-01 DIAGNOSIS — R300 Dysuria: Secondary | ICD-10-CM

## 2011-03-01 DIAGNOSIS — E785 Hyperlipidemia, unspecified: Secondary | ICD-10-CM

## 2011-03-01 DIAGNOSIS — Z13220 Encounter for screening for lipoid disorders: Secondary | ICD-10-CM

## 2011-03-01 DIAGNOSIS — R110 Nausea: Secondary | ICD-10-CM

## 2011-03-01 DIAGNOSIS — K219 Gastro-esophageal reflux disease without esophagitis: Secondary | ICD-10-CM

## 2011-03-01 LAB — CBC WITH DIFFERENTIAL/PLATELET
Eosinophils Relative: 1 % (ref 0.0–5.0)
MCV: 95.2 fl (ref 78.0–100.0)
Monocytes Absolute: 0.8 10*3/uL (ref 0.1–1.0)
Monocytes Relative: 9.2 % (ref 3.0–12.0)
Neutrophils Relative %: 55.6 % (ref 43.0–77.0)
Platelets: 245 10*3/uL (ref 150.0–400.0)
WBC: 8.5 10*3/uL (ref 4.5–10.5)

## 2011-03-01 LAB — URINALYSIS, ROUTINE W REFLEX MICROSCOPIC
Nitrite: NEGATIVE
Urobilinogen, UA: 0.2 (ref 0.0–1.0)

## 2011-03-01 MED ORDER — PROMETHAZINE HCL 25 MG PO TABS: 25.0000 mg | ORAL_TABLET | Freq: Four times a day (QID) | ORAL | Status: DC | PRN

## 2011-03-01 MED ORDER — CEPHALEXIN 500 MG PO CAPS: 500.0000 mg | ORAL_CAPSULE | Freq: Four times a day (QID) | ORAL | Status: AC

## 2011-03-01 MED ORDER — PANTOPRAZOLE SODIUM 40 MG PO TBEC: 40.0000 mg | DELAYED_RELEASE_TABLET | Freq: Every day | ORAL | Status: DC

## 2011-03-01 NOTE — Patient Instructions (Addendum)
Take all new medications as prescribed - the protonix generic, and the antibiotic (cephalexin) Continue all other medications as before, including the phenergan as needed for nausea Please go to LAB in the Basement for the blood and/or urine tests to be done today Please call the phone number [phone] (the PhoneTree System) for results of testing in 2-3 days;  When calling, simply dial the number, and when prompted enter the MRN number above (the Medical Record Number) and the # key, then the message should start. Please return in 6 months, or sooner if needed

## 2011-03-02 LAB — HEPATIC FUNCTION PANEL
ALT: 28 U/L (ref 0–35)
AST: 28 U/L (ref 0–37)
Alkaline Phosphatase: 69 U/L (ref 39–117)
Bilirubin, Direct: 0.1 mg/dL (ref 0.0–0.3)
Total Protein: 7.1 g/dL (ref 6.0–8.3)

## 2011-03-02 LAB — BASIC METABOLIC PANEL
BUN: 15 mg/dL (ref 6–23)
Creatinine, Ser: 0.8 mg/dL (ref 0.4–1.2)
GFR: 78.05 mL/min (ref >60.00)

## 2011-03-02 LAB — LDL CHOLESTEROL, DIRECT: Direct LDL: 139.2 mg/dL

## 2011-03-02 LAB — HEMOGLOBIN A1C: Hgb A1c MFr Bld: 5.8 % (ref 4.6–6.5)

## 2011-03-05 ENCOUNTER — Encounter: Payer: Self-pay | Admitting: Internal Medicine

## 2011-03-05 NOTE — Assessment & Plan Note (Signed)
Mild worsening - to change the zantac to PPI ,  to f/u any worsening symptoms or concerns

## 2011-03-05 NOTE — Assessment & Plan Note (Signed)
Mild recurrent - for phenergan prn,  to f/u any worsening symptoms or concerns

## 2011-03-05 NOTE — Assessment & Plan Note (Signed)
Mild to mod, ?UTI - for cephalexin course , pt is afeb but with acute onset fatigue, back pain as well as symptoms -  to f/u any worsening symptoms or concerns

## 2011-03-05 NOTE — Assessment & Plan Note (Signed)
stable overall by hx and exam, most recent lab reviewed with [name], and [name] to continue medical treatment as before  Lab Results  Component Value Date   WBC 8.5 03/01/2011   HGB 14.2 03/01/2011   HCT 41.5 03/01/2011   PLT 245.0 03/01/2011   CHOL 205* 03/01/2011   TRIG 162.0* 03/01/2011   HDL 67.30 03/01/2011   LDLDIRECT 139.2 03/01/2011   ALT 28 03/01/2011   AST 28 03/01/2011   NA 138 03/01/2011   K 5.2* 03/01/2011   CL 102 03/01/2011   CREATININE 0.8 03/01/2011   BUN 15 03/01/2011   CO2 32 03/01/2011   TSH 1.79 03/01/2011   INR 3.1 02/21/2011   HGBA1C 5.8 03/01/2011

## 2011-03-05 NOTE — Progress Notes (Signed)
Subjective:    Patient ID: [name], female    DOB: October 30, 1929, 77 y.o.   MRN: [id number]  HPI  Here to f/u with acute onset 2-3 days pain on urination, with some freqeuncy, and mild discomfort across the lower back as well,and increased fatigue, stating "I just dont feel right."  Pt denies chest pain, increased sob or doe, wheezing, orthopnea, PND, increased LE swelling, palpitations, dizziness or syncope.  Pt denies new neurological symptoms such as new headache, or facial or extremity weakness or numbness   Pt denies polydipsia, polyuria  Pt states overall good compliance with meds,  wt overall stable but little exercise however.   Also with mild worsening reflux, but no dysphagia, abd pain, n/v, bowel change or blood.  Does have several wks ongoing nasal allergy symptoms with clear congestion, itch and sneeze, without fever, pain, ST, cough or wheezing, but with recurring headaches as well.  Needs refill phenergan. Zantac no longer working for the reflux. Pt without change in bowel or bladde, no fever, wt loss,  worsening LE pain/numbness/weakness, gait change or falls.  Pt denies fever, wt loss, night sweats, loss of appetite, or other constitutional symptoms  Denies worsening depressive symptoms, suicidal ideation, or panic, though has ongoing anxiety, not increased recently.  Not taking the loratidine lately.  No overt bleeding or bruising. Past Medical History  Diagnosis Date  . VIRAL INFECTION 05/07/2008  . HYPERLIPIDEMIA 08/05/2007  . ANXIETY 08/05/2007  . DEPRESSION 08/05/2007  . COMMON MIGRAINE 08/05/2007  . Unspecified hearing loss 10/17/2007  . HYPERTENSION 03/11/2007  . CORONARY ARTERY DISEASE 04/06/2009  . PULMONARY HYPERTENSION, SECONDARY 01/15/2008  . CAROTID ARTERY STENOSIS, RIGHT 08/05/2007  . SINUSITIS- ACUTE-NOS 10/17/2007  . URI 07/15/2008  . Chronic rhinitis 11/22/2008  . ALLERGIC RHINITIS 08/05/2007  . COPD 03/31/2008  . GERD 03/11/2007  . DIVERTICULOSIS, COLON 08/05/2007  .  CYST/PSEUDOCYST, PANCREAS 08/05/2007  . HEMATOCHEZIA 05/07/2008  . OVERACTIVE BLADDER 05/26/2009  . CELLULITIS, LEG, LEFT 03/25/2009  . SKIN LESION 04/06/2009  . OSTEOARTHRITIS, KNEE, RIGHT 08/05/2007  . OSTEOPOROSIS 03/11/2007  . DIZZINESS, CHRONIC 10/14/2009  . GAIT IMBALANCE 11/26/2007  . FATIGUE 07/15/2008  . WEIGHT LOSS-ABNORMAL 06/06/2010  . Headache 11/26/2007  . DYSPNEA 12/30/2007  . NAUSEA 06/06/2010  . FLATULENCE-GAS-BLOATING 07/03/2010  . Dysuria 05/27/2008  . FREQUENCY, URINARY 03/25/2009  . ABDOMINAL PAIN-PERIUMBILICAL 06/06/2010  . ABDOMINAL PAIN-EPIGASTRIC 06/06/2010  . ABNORMAL EXAM-BILIARY TRACT 06/06/2010  . ABNORMAL FINDINGS GI TRACT 07/03/2010  . TRANSIENT ISCHEMIC ATTACK, HX OF 03/11/2007  . PULMONARY EMBOLISM, HX OF 08/05/2007  . COLONIC POLYPS, HX OF 04/06/2009  . Personal History of Other Diseases of Digestive Disease 08/05/2007  . Pulmonary embolism 12/08/2010   Past Surgical History  Procedure Date  . Abdominal hysterectomy   . Egd 1999  . Cholecystectomy   . Tonsillectomy   . Appendectomy   . S/p sinus surgury     reports that she has never smoked. She does not have any smokeless tobacco history on file. She reports that she does not drink alcohol or use illicit drugs. family history includes Cancer in her daughter. Allergies  Allergen Reactions  . Ciclesonide     REACTION: bad smell  . ION:GEXBMWUXLKG+MWNUUVOZD+GUYQIHKVQQ Acid+Aspartame     REACTION: diarrhea  . Raloxifene     REACTION: risk of recurrent stroke  . Sulfonamide Derivatives     REACTION: tongue swells   Current Outpatient Prescriptions on File Prior to Visit  Medication Sig Dispense Refill  . amLODipine (NORVASC)  5 MG tablet Take 5 mg by mouth daily.        [name] aspirin 81 MG EC tablet Take 81 mg by mouth daily.        . calcium carbonate (TUMS) 500 MG chewable tablet Chew 1 tablet by mouth daily as needed.        . Digestive Enzymes (PAPAYA AND ENZYMES) CHEW Chew by mouth. Chew 2 tablets with meals  as needed       . loratadine (CLARITIN) 10 MG tablet Take 10 mg by mouth daily.        [name] losartan (COZAAR) 100 MG tablet Take 100 mg by mouth daily.        . mometasone (NASONEX) 50 MCG/ACT nasal spray 2 sprays by Nasal route daily.        . promethazine (PHENERGAN) 25 MG tablet Take 25 mg by mouth. Take 1/2 tablet every 6-8 hours as needed for nausea       . warfarin (COUMADIN) 5 MG tablet Take by mouth as directed.         Review of Systems All otherwise neg per pt     Objective:   Physical Exam BP 140/72  Pulse 82  Temp(Src) 98.4 F (36.9 C) (Oral)  Ht 5\' 5"  (1.651 m)  Wt 164 lb 6 oz (74.56 kg)  BMI 27.35 kg/m2  SpO2 97% Physical Exam  VS noted, appears mild ill, fatigued Constitutional: Pt appears well-developed and well-nourished.  HENT: Head: Normocephalic.  Right Ear: External ear normal.  Left Ear: External ear normal.  Eyes: Conjunctivae and EOM are normal. Pupils are equal, round, and reactive to light.  Neck: Normal range of motion. Neck supple.  Cardiovascular: Normal rate and regular rhythm.   Pulmonary/Chest: Effort normal and breath sounds normal.  Abd:  Soft, NT, non-distended, + BS with mild low mid abd tender Neurological: Pt is alert. No cranial nerve deficit. motor/dtr's intact to LE's Skin: Skin is warm. No erythema.  Psychiatric: Pt behavior is normal. Thought content normal. 1+ nervous Spine and paravertebral nontender       Assessment & Plan:

## 2011-03-05 NOTE — Assessment & Plan Note (Signed)
Etiology unclear, Exam otherwise benign, to check labs as documented, follow with expectant management  

## 2011-03-14 ENCOUNTER — Encounter: Payer: Medicare Other | Admitting: *Deleted

## 2011-04-04 ENCOUNTER — Encounter: Payer: Medicare Other | Admitting: *Deleted

## 2011-04-04 ENCOUNTER — Ambulatory Visit (INDEPENDENT_AMBULATORY_CARE_PROVIDER_SITE_OTHER): Payer: Medicare Other | Admitting: *Deleted

## 2011-04-04 DIAGNOSIS — I2699 Other pulmonary embolism without acute cor pulmonale: Secondary | ICD-10-CM

## 2011-04-04 DIAGNOSIS — Z86718 Personal history of other venous thrombosis and embolism: Secondary | ICD-10-CM

## 2011-04-04 LAB — POCT INR: INR: 2.2

## 2011-04-06 ENCOUNTER — Other Ambulatory Visit: Payer: Self-pay | Admitting: Emergency Medicine

## 2011-04-06 DIAGNOSIS — IMO0002 Reserved for concepts with insufficient information to code with codable children: Secondary | ICD-10-CM

## 2011-05-02 ENCOUNTER — Ambulatory Visit (INDEPENDENT_AMBULATORY_CARE_PROVIDER_SITE_OTHER): Payer: Medicare Other | Admitting: *Deleted

## 2011-05-02 DIAGNOSIS — Z86718 Personal history of other venous thrombosis and embolism: Secondary | ICD-10-CM

## 2011-05-02 DIAGNOSIS — I2699 Other pulmonary embolism without acute cor pulmonale: Secondary | ICD-10-CM

## 2011-05-02 LAB — POCT INR: INR: 2

## 2011-05-21 ENCOUNTER — Encounter: Payer: Self-pay | Admitting: Internal Medicine

## 2011-05-21 ENCOUNTER — Ambulatory Visit (INDEPENDENT_AMBULATORY_CARE_PROVIDER_SITE_OTHER): Payer: Medicare Other | Admitting: Internal Medicine

## 2011-05-21 DIAGNOSIS — J449 Chronic obstructive pulmonary disease, unspecified: Secondary | ICD-10-CM

## 2011-05-21 DIAGNOSIS — R079 Chest pain, unspecified: Secondary | ICD-10-CM | POA: Insufficient documentation

## 2011-05-21 DIAGNOSIS — R002 Palpitations: Secondary | ICD-10-CM | POA: Insufficient documentation

## 2011-05-21 DIAGNOSIS — F411 Generalized anxiety disorder: Secondary | ICD-10-CM

## 2011-05-21 DIAGNOSIS — K648 Other hemorrhoids: Secondary | ICD-10-CM | POA: Insufficient documentation

## 2011-05-21 MED ORDER — MECLIZINE HCL 12.5 MG PO TABS: 12.5000 mg | ORAL_TABLET | Freq: Three times a day (TID) | ORAL | Status: DC | PRN

## 2011-05-21 MED ORDER — ESCITALOPRAM OXALATE 10 MG PO TABS: 10.0000 mg | ORAL_TABLET | Freq: Every day | ORAL | Status: DC

## 2011-05-21 MED ORDER — HYDROCORTISONE ACETATE 25 MG RE SUPP: 25.0000 mg | Freq: Two times a day (BID) | RECTAL | Status: AC

## 2011-05-21 NOTE — Assessment & Plan Note (Signed)
stable overall by hx and exam, most recent data reviewed with pt, and pt to continue medical treatment as before, including the Home o2 with exertion (vacuuming, etc)

## 2011-05-21 NOTE — Assessment & Plan Note (Signed)
No recent bleeding per pt; but has been discomforting - for anusol HC supp prn,  to f/u any worsening symptoms or concerns

## 2011-05-21 NOTE — Patient Instructions (Addendum)
Take all new medications as prescribed - the lexapro daily, and the suppositories as needed Continue all other medications as before, including the meclizine Your EKG was Stable today You will be contacted regarding the referral for: stress test, and Holter monitor You should still be contacted at later date for the Echocardiogram later this year as per usual followup evaluation

## 2011-05-21 NOTE — Assessment & Plan Note (Addendum)
Quite symptomatic, daily - for 24 holter, consider beta blocker

## 2011-05-21 NOTE — Assessment & Plan Note (Signed)
We have been discussing tx for anxiety for many yrs, and she assents today - for lexapro 10 mg qd

## 2011-05-21 NOTE — Progress Notes (Signed)
Subjective:    Patient ID: [name], female    DOB: 08-Jan-1930, 77 y.o.   MRN: [id number]  HPI  Here to c/o hard beating of the heart (not necessarily fast) recurring mult times per day,  Worse to lie down,  Ongoing for several wks, mild to mod, with a sense of intermittent mid and left sided chest pressure  Without radiation, sob, n/v, diaphroesis, syncope though occasionally is dizzy which has responded to meclizine in the past.  Non exertional, nonpleuritic, no cough, fever, ST, HA, chills, wheezing, or worsening LE edema or PND.  No overt bleeding though has had increased internal hemorrhoid discomfort recently, and reqeusts tx.  Also has had worsening mild depressive symptoms, without suicidal ideation, or panic, though has ongoing anxiety as well.  Has resisted tx for several yrs.  Pt denies new neurological symptoms such as new headache, or facial or extremity weakness or numbness   Pt denies polydipsia, polyuria  Pt states overall good compliance with meds, trying to follow lower cholesterol diet, wt overall stable. Past Medical History  Diagnosis Date  . VIRAL INFECTION 05/07/2008  . HYPERLIPIDEMIA 08/05/2007  . ANXIETY 08/05/2007  . DEPRESSION 08/05/2007  . COMMON MIGRAINE 08/05/2007  . Unspecified hearing loss 10/17/2007  . HYPERTENSION 03/11/2007  . CORONARY ARTERY DISEASE 04/06/2009  . PULMONARY HYPERTENSION, SECONDARY 01/15/2008  . CAROTID ARTERY STENOSIS, RIGHT 08/05/2007  . SINUSITIS- ACUTE-NOS 10/17/2007  . URI 07/15/2008  . Chronic rhinitis 11/22/2008  . ALLERGIC RHINITIS 08/05/2007  . COPD 03/31/2008  . GERD 03/11/2007  . DIVERTICULOSIS, COLON 08/05/2007  . CYST/PSEUDOCYST, PANCREAS 08/05/2007  . HEMATOCHEZIA 05/07/2008  . OVERACTIVE BLADDER 05/26/2009  . CELLULITIS, LEG, LEFT 03/25/2009  . SKIN LESION 04/06/2009  . OSTEOARTHRITIS, KNEE, RIGHT 08/05/2007  . OSTEOPOROSIS 03/11/2007  . DIZZINESS, CHRONIC 10/14/2009  . GAIT IMBALANCE 11/26/2007  . FATIGUE 07/15/2008  . WEIGHT  LOSS-ABNORMAL 06/06/2010  . Headache 11/26/2007  . DYSPNEA 12/30/2007  . NAUSEA 06/06/2010  . FLATULENCE-GAS-BLOATING 07/03/2010  . Dysuria 05/27/2008  . FREQUENCY, URINARY 03/25/2009  . ABDOMINAL PAIN-PERIUMBILICAL 06/06/2010  . ABDOMINAL PAIN-EPIGASTRIC 06/06/2010  . ABNORMAL EXAM-BILIARY TRACT 06/06/2010  . ABNORMAL FINDINGS GI TRACT 07/03/2010  . TRANSIENT ISCHEMIC ATTACK, HX OF 03/11/2007  . PULMONARY EMBOLISM, HX OF 08/05/2007  . COLONIC POLYPS, HX OF 04/06/2009  . Personal History of Other Diseases of Digestive Disease 08/05/2007  . Pulmonary embolism 12/08/2010   Past Surgical History  Procedure Date  . Abdominal hysterectomy   . Egd 1999  . Cholecystectomy   . Tonsillectomy   . Appendectomy   . S/p sinus surgury     reports that she has never smoked. She does not have any smokeless tobacco history on file. She reports that she does not drink alcohol or use illicit drugs. family history includes Cancer in her daughter. Allergies  Allergen Reactions  . Ciclesonide     REACTION: bad smell  . WUJ:WJXBJYNWGNF+AOZHYQMVH+QIONGEXBMW Acid+Aspartame     REACTION: diarrhea  . Raloxifene     REACTION: risk of recurrent stroke  . Sulfonamide Derivatives     REACTION: tongue swells   Current Outpatient Prescriptions on File Prior to Visit  Medication Sig Dispense Refill  . amLODipine (NORVASC) 5 MG tablet Take 5 mg by mouth daily.        [name] aspirin 81 MG EC tablet Take 81 mg by mouth daily.        . calcium carbonate (TUMS) 500 MG chewable tablet Chew 1 tablet by mouth daily as  needed.        . Digestive Enzymes (PAPAYA AND ENZYMES) CHEW Chew by mouth. Chew 2 tablets with meals as needed       . loratadine (CLARITIN) 10 MG tablet Take 10 mg by mouth daily.        [name] losartan (COZAAR) 100 MG tablet Take 100 mg by mouth daily.        . mometasone (NASONEX) 50 MCG/ACT nasal spray 2 sprays by Nasal route daily.        . pantoprazole (PROTONIX) 40 MG tablet Take 1 tablet (40 mg total) by mouth  daily.  30 tablet  1  . promethazine (PHENERGAN) 25 MG tablet Take 25 mg by mouth. Take 1/2 tablet every 6-8 hours as needed for nausea       . warfarin (COUMADIN) 5 MG tablet Take by mouth as directed.         Review of Systems Review of Systems  Constitutional: Negative for diaphoresis and unexpected weight change.  HENT: Negative for drooling and tinnitus.   Eyes: Negative for photophobia and visual disturbance.  Respiratory: Negative for choking and stridor.   Gastrointestinal: Negative for vomiting and blood in stool.  Genitourinary: Negative for hematuria and decreased urine volume.    Objective:   Physical Exam BP 152/70  Pulse 74  Temp(Src) 97.7 F (36.5 C) (Oral)  Ht 5\' 5"  (1.651 m)  Wt 165 lb 6 oz (75.014 kg)  BMI 27.52 kg/m2  SpO2 95% Physical Exam  VS noted, not ill appearing Constitutional: Pt appears well-developed and well-nourished.  HENT: Head: Normocephalic.  Right Ear: External ear normal.  Left Ear: External ear normal.  Eyes: Conjunctivae and EOM are normal. Pupils are equal, round, and reactive to light.  Neck: Normal range of motion. Neck supple.  Cardiovascular: Normal rate and regular rhythm.   Pulmonary/Chest: Effort normal and breath sounds normal.  Abd:  Soft, NT, non-distended, +BS Neurological: Pt is alert. No cranial nerve deficit.  Skin: Skin is warm. No erythema.  Psychiatric: Pt behavior is normal. Thought content normal. Mild depressed affect, 1+nervous         Assessment & Plan:

## 2011-05-21 NOTE — Assessment & Plan Note (Addendum)
Atypical, ECG reviewed today - sinus, no acute changes, PAC'snoted;  For stress test for further eval, consdier card referral

## 2011-05-30 ENCOUNTER — Encounter: Payer: Medicare Other | Admitting: *Deleted

## 2011-05-31 ENCOUNTER — Ambulatory Visit (HOSPITAL_COMMUNITY): Payer: Medicare Other | Attending: Internal Medicine | Admitting: Radiology

## 2011-05-31 ENCOUNTER — Ambulatory Visit (INDEPENDENT_AMBULATORY_CARE_PROVIDER_SITE_OTHER): Payer: Medicare Other | Admitting: *Deleted

## 2011-05-31 DIAGNOSIS — I2699 Other pulmonary embolism without acute cor pulmonale: Secondary | ICD-10-CM

## 2011-05-31 DIAGNOSIS — Z86718 Personal history of other venous thrombosis and embolism: Secondary | ICD-10-CM

## 2011-05-31 DIAGNOSIS — I491 Atrial premature depolarization: Secondary | ICD-10-CM

## 2011-05-31 DIAGNOSIS — R0789 Other chest pain: Secondary | ICD-10-CM

## 2011-05-31 DIAGNOSIS — R0989 Other specified symptoms and signs involving the circulatory and respiratory systems: Secondary | ICD-10-CM

## 2011-05-31 DIAGNOSIS — R079 Chest pain, unspecified: Secondary | ICD-10-CM | POA: Insufficient documentation

## 2011-05-31 MED ORDER — TECHNETIUM TC 99M TETROFOSMIN IV KIT
11.0000 | PACK | Freq: Once | INTRAVENOUS | Status: AC | PRN
  Administered 2011-05-31: 11 via INTRAVENOUS

## 2011-05-31 MED ORDER — REGADENOSON 0.4 MG/5ML IV SOLN
0.4000 mg | Freq: Once | INTRAVENOUS | Status: AC
  Administered 2011-05-31: 0.4 mg via INTRAVENOUS

## 2011-05-31 MED ORDER — TECHNETIUM TC 99M TETROFOSMIN IV KIT
33.0000 | PACK | Freq: Once | INTRAVENOUS | Status: AC | PRN
  Administered 2011-05-31: 33 via INTRAVENOUS

## 2011-05-31 NOTE — Progress Notes (Signed)
[facility] SITE 3 NUCLEAR MED [address] [place] [address] [phone]  Cardiology Nuclear Med Study  [name] is a 77 y.o. female [id number] 06-10-1930   Nuclear Med Background Indication for Stress Test:  Evaluation for Ischemia History:  Asthma, COPD and '08 MPS:(No report); '09 Cardiac IO:[id number] and Aortic Calcifications; '11 Echo:EF=65-70%; H/O Pulmonary HTN Cardiac Risk Factors: Carotid Disease, Family History - CAD, Hypertension, Lipids, PVD and TIA  Symptoms:  Chest Pressure.  (last episode of chest discomfort was this a.m., 8/10; none now), DOE, Fatigue, Nausea and Palpitations   Nuclear Pre-Procedure Caffeine/Decaff Intake:  None NPO After: 10:00pm   Lungs:  Clear.  O2 sat 96% on RA. IV 0.9% NS with Angio Cath:  20g  IV Site: R Wrist  IV Started by:  Bonnita Levan, RN  Chest Size (in):  36  Cup Size: C  Height: 5\' 5"  (1.651 m)  Weight:  166 lb (75.297 kg)  BMI:  Body mass index is 27.62 kg/(m^2). Tech Comments:  No medications taken today.    Nuclear Med Study 1 or 2 day study: 1 day  Stress Test Type:  Lexiscan  Reading MD: Cassell Clement, MD  Order Authorizing Provider:  Dr. Oliver Barre  Resting Radionuclide: Technetium 99m Tetrofosmin  Resting Radionuclide Dose: 11.0 mCi   Stress Radionuclide:  Technetium 27m Tetrofosmin  Stress Radionuclide Dose: 33.0 mCi           Stress Protocol Rest HR: 67 Stress HR: 85  Rest BP: 165/76 Stress BP: 160/67  Exercise Time (min): n/a METS: n/a   Predicted Max HR: 141 bpm % Max HR: 60.28 bpm Rate Pressure Product: 13244   Dose of Adenosine (mg):  n/a Dose of Lexiscan: 0.4 mg  Dose of Atropine (mg): n/a Dose of Dobutamine: n/a mcg/kg/min (at max HR)  Stress Test Technologist: Smiley Houseman, CMA-N  Nuclear Technologist:  Doyne Keel, CNMT     Rest Procedure:  Myocardial perfusion imaging was performed at rest 45 minutes following the intravenous administration of Technetium 64m  Tetrofosmin.  Rest ECG: No acute changes, occasional PAC's noted.  Stress Procedure:  The patient received IV Lexiscan 0.4 mg over 15-seconds.  Technetium 42m Tetrofosmin injected at 30-seconds.  There were no significant changes with Lexiscan, occasional PAC's noted.  Quantitative spect images were obtained after a 45 minute delay.  Stress ECG: No significant change from baseline ECG  QPS Raw Data Images:  Normal; no motion artifact; normal heart/lung ratio. Stress Images:  Normal homogeneous uptake in all areas of the myocardium. Rest Images:  Normal homogeneous uptake in all areas of the myocardium. Subtraction (SDS):  No evidence of ischemia. Transient Ischemic Dilatation (Normal <1.22):  1.01 Lung/Heart Ratio (Normal <0.45):  0.32  Quantitative Gated Spect Images QGS EDV:  50 ml QGS ESV:  8 ml QGS cine images:  NL LV Function; NL Wall Motion QGS EF: 84%  Impression Exercise Capacity:  Lexiscan with no exercise. BP Response:  Normal blood pressure response. Clinical Symptoms:  No chest pain. ECG Impression:  No significant ST segment change suggestive of ischemia. Comparison with Prior Nuclear Study: No images to compare  Overall Impression:  Normal stress nuclear study.    Cassell Clement

## 2011-06-21 ENCOUNTER — Telehealth: Payer: Self-pay | Admitting: Internal Medicine

## 2011-06-21 ENCOUNTER — Ambulatory Visit (INDEPENDENT_AMBULATORY_CARE_PROVIDER_SITE_OTHER): Payer: Medicare Other | Admitting: *Deleted

## 2011-06-21 DIAGNOSIS — Z86718 Personal history of other venous thrombosis and embolism: Secondary | ICD-10-CM

## 2011-06-21 DIAGNOSIS — I2699 Other pulmonary embolism without acute cor pulmonale: Secondary | ICD-10-CM

## 2011-06-21 MED ORDER — CITALOPRAM HYDROBROMIDE 20 MG PO TABS: 20.0000 mg | ORAL_TABLET | Freq: Every day | ORAL | Status: DC

## 2011-06-21 NOTE — Telephone Encounter (Signed)
Left message on home machine for pt to return my call. Mobile number is out of service.

## 2011-06-21 NOTE — Telephone Encounter (Signed)
Pt informed

## 2011-06-21 NOTE — Telephone Encounter (Signed)
Message copied by Corwin Levins on Thu Jun 21, 2011 12:25 PM ------      Message from: Weston Brass R      Created: Thu Jun 21, 2011 11:20 AM       Pt was prescribed Lexapro 10mg  a few weeks ago for anxiety.  She told us today in [facility] she has not started this due to cost.  Could we consider changing her to Celexa 20mg  instead (it is available as a generic).  Thanks!

## 2011-06-21 NOTE — Telephone Encounter (Signed)
Ok to change lexapro to celexa - done per emr

## 2011-07-11 ENCOUNTER — Ambulatory Visit (INDEPENDENT_AMBULATORY_CARE_PROVIDER_SITE_OTHER): Payer: Medicare Other

## 2011-07-11 DIAGNOSIS — Z23 Encounter for immunization: Secondary | ICD-10-CM

## 2011-07-19 ENCOUNTER — Ambulatory Visit (INDEPENDENT_AMBULATORY_CARE_PROVIDER_SITE_OTHER): Payer: Medicare Other | Admitting: *Deleted

## 2011-07-19 DIAGNOSIS — I2699 Other pulmonary embolism without acute cor pulmonale: Secondary | ICD-10-CM

## 2011-07-19 DIAGNOSIS — Z86718 Personal history of other venous thrombosis and embolism: Secondary | ICD-10-CM

## 2011-07-19 LAB — CBC
HCT: 36.5
HCT: 37.4
Hemoglobin: 12.8
Hemoglobin: 13.1
MCHC: 35.1
Platelets: 278
RBC: 3.88
RBC: 4
RDW: 13.3
RDW: 13.3
RDW: 13.3

## 2011-07-19 LAB — PROTEIN C, TOTAL: Protein C, Total: 121 % (ref 70–140)

## 2011-07-19 LAB — COMPREHENSIVE METABOLIC PANEL
ALT: 25
AST: 28
Albumin: 3.5
Alkaline Phosphatase: 99
GFR calc Af Amer: >60
Potassium: 3.4 — ABNORMAL LOW
Sodium: 138
Total Protein: 7

## 2011-07-19 LAB — PROTEIN C ACTIVITY: Protein C Activity: 156 % — ABNORMAL HIGH (ref 75–133)

## 2011-07-19 LAB — BASIC METABOLIC PANEL
CO2: 28
CO2: 28
GFR calc Af Amer: >60
GFR calc non Af Amer: >60
Glucose, Bld: 106 — ABNORMAL HIGH
Glucose, Bld: 106 — ABNORMAL HIGH
Potassium: 3.7
Potassium: 4
Sodium: 134 — ABNORMAL LOW
Sodium: 137

## 2011-07-19 LAB — DIFFERENTIAL
Basophils Relative: 0
Monocytes Absolute: 0.7
Monocytes Relative: 10
Neutro Abs: 5.2

## 2011-07-19 LAB — CULTURE, BLOOD (ROUTINE X 2)
Culture: NO GROWTH
Culture: NO GROWTH

## 2011-07-19 LAB — PROTIME-INR
INR: 0.9
INR: 1
INR: 1.4
Prothrombin Time: 17.3 — ABNORMAL HIGH

## 2011-07-19 LAB — CARDIAC PANEL(CRET KIN+CKTOT+MB+TROPI): Total CK: 32

## 2011-07-19 LAB — SEDIMENTATION RATE: Sed Rate: 91 — ABNORMAL HIGH

## 2011-07-19 LAB — CK TOTAL AND CKMB (NOT AT ARMC): CK, MB: 0.9

## 2011-07-19 LAB — HOMOCYSTEINE: Homocysteine: 7.4

## 2011-07-19 LAB — PROTHROMBIN GENE MUTATION

## 2011-07-19 LAB — FACTOR 5 LEIDEN

## 2011-07-19 LAB — PROTEIN S, TOTAL: Protein S Ag, Total: 163 % — ABNORMAL HIGH (ref 70–140)

## 2011-07-19 LAB — LUPUS ANTICOAGULANT PANEL: PTT Lupus Anticoagulant: 40.5 (ref 36.3–48.8)

## 2011-07-19 LAB — POCT CARDIAC MARKERS: CKMB, poc: <1 — ABNORMAL LOW

## 2011-07-19 LAB — CARDIOLIPIN ANTIBODIES, IGG, IGM, IGA
Anticardiolipin IgA: >100 (ref <13)
Anticardiolipin IgM: <7 — ABNORMAL LOW (ref <10)

## 2011-07-19 LAB — PROTEIN S ACTIVITY: Protein S Activity: 117 % (ref 69–129)

## 2011-07-19 LAB — BETA-2-GLYCOPROTEIN I ABS, IGG/M/A: Beta-2-Glycoprotein I IgM: <4 U/mL (ref <10)

## 2011-08-02 ENCOUNTER — Telehealth: Payer: Self-pay | Admitting: Emergency Medicine

## 2011-08-02 NOTE — Telephone Encounter (Signed)
I spoke with Synetta Fail at [facility] and she says they need new qualifying sats for this pt in order for her to keep her oxygen and for [facility] to pay for it. Pt is due for OV on 11/6 and we can requalify her at that time. The pt has been notified of this as well. Will add a note to OV reason to remind [name] pt needs to requalfy for her O2.

## 2011-08-03 ENCOUNTER — Other Ambulatory Visit: Payer: Self-pay | Admitting: Internal Medicine

## 2011-08-14 ENCOUNTER — Encounter: Payer: Self-pay | Admitting: Emergency Medicine

## 2011-08-14 ENCOUNTER — Ambulatory Visit (INDEPENDENT_AMBULATORY_CARE_PROVIDER_SITE_OTHER): Payer: Medicare Other | Admitting: Emergency Medicine

## 2011-08-14 DIAGNOSIS — I2789 Other specified pulmonary heart diseases: Secondary | ICD-10-CM

## 2011-08-14 DIAGNOSIS — I2699 Other pulmonary embolism without acute cor pulmonale: Secondary | ICD-10-CM

## 2011-08-14 DIAGNOSIS — J449 Chronic obstructive pulmonary disease, unspecified: Secondary | ICD-10-CM

## 2011-08-14 NOTE — Progress Notes (Signed)
81 yowf never smoker, allergic rhinitis, hx bilat PE in September 2008,with asociated Pulm HTN (PASP 70 with intact LV fxn). Started on Spiriva after PFT's showed mild to mod AFL without BD response; she stopped this due to dry eyes, L eye pain. She may have lost a little ground off the Spiriva. Walking oximetry shows hypoxemia, and we started O2 with exertion. She is not using reliably - doesn't take it with her into stores, etc.  Repeated V/Q scan Oct 05/2008 which suggested pulmonary emboli but ct chest negative and echo showed improved PASP  November 22, 2008 just wearing 02 with housework can't really tell it's helping. Pt denies any significant sore throat, or excess secretions, fever, chills, sweats, unintended wt loss, pleuritic or exertional cp, orthopnea pnd or leg swelling. Does have lots of itching/sneezing nasal congestion but no wheezing or chest tightness.   ROV 09/14/09 -- pulmonary HTN in setting thromboemboli (improved on CT scan 1 yr ago). She tells me that her exertional tolerance is good, uses her O2 with most exertion. Compliant with coumadin.   ROV 05/18/10 -- returns to f/u [facility] in setting VTE. Since last visit started on on allergy immunotherapy, has helped her significantly. Able to exert, wears her O2 when active. No significant changes in clinical status. Last TTE 11/11. Not on any BD's.   ROV 08/14/11 -- f/u [facility] in setting VTE, allergic rhinitis on immunotherapy. She has been having more cough last 2 weeks, yellow mucous. Good compliance w coumadin for her VTE. Taking loratadine daily, NSW daily, occasional nasal steroid. She had documented desats on RA today.   Gen: Pleasant, well-nourished, in no distress,  normal affect  ENT: No lesions,  mouth clear,  oropharynx clear, no postnasal drip  Neck: No JVD, no TMG, no carotid bruits  Lungs: No use of accessory muscles, no dullness to percussion, clear without rales or rhonchi  Cardiovascular: RRR, heart sounds normal, no  murmur or gallops, no peripheral edema  Abdomen: soft and NT, no HSM,  BS normal  Musculoskeletal: No deformities, no cyanosis or clubbing  Neuro: alert, non focal  Skin: Warm, no lesions or rashes   PULMONARY HYPERTENSION, SECONDARY Due to VTE, probable some contribution of COPD - continue coumadin - will recheck TTE in 08/2012 - O2 requalified, will send data to [facility]  COPD -on prn SABA only  Pulmonary embolism Plan is for lifelong coumadin

## 2011-08-14 NOTE — Assessment & Plan Note (Addendum)
Due to VTE, probable some contribution of COPD - continue coumadin - will recheck TTE in 08/2012 - O2 requalified, will send data to [facility]

## 2011-08-14 NOTE — Assessment & Plan Note (Signed)
Plan is for lifelong coumadin 

## 2011-08-14 NOTE — Assessment & Plan Note (Signed)
-  on prn SABA only

## 2011-08-14 NOTE — Patient Instructions (Addendum)
Please continue your coumadin Wear your oxygen with all exertion.  We will qualify you for oxygen with [facility].  Continue your loratadine, nasal saline washes.  Use your nasonex as needed Follow with Dr Delton Coombes in 6 months or sooner if you have any problems.

## 2011-08-16 ENCOUNTER — Encounter: Payer: Medicare Other | Admitting: *Deleted

## 2011-08-22 ENCOUNTER — Ambulatory Visit (INDEPENDENT_AMBULATORY_CARE_PROVIDER_SITE_OTHER): Payer: Medicare Other | Admitting: *Deleted

## 2011-08-22 DIAGNOSIS — Z86718 Personal history of other venous thrombosis and embolism: Secondary | ICD-10-CM

## 2011-08-22 DIAGNOSIS — I2699 Other pulmonary embolism without acute cor pulmonale: Secondary | ICD-10-CM

## 2011-08-22 DIAGNOSIS — Z7901 Long term (current) use of anticoagulants: Secondary | ICD-10-CM

## 2011-08-22 LAB — POCT INR: INR: 2.9

## 2011-08-23 ENCOUNTER — Ambulatory Visit: Payer: Medicare Other | Admitting: Internal Medicine

## 2011-09-01 ENCOUNTER — Other Ambulatory Visit: Payer: Self-pay | Admitting: Internal Medicine

## 2011-09-19 ENCOUNTER — Ambulatory Visit (INDEPENDENT_AMBULATORY_CARE_PROVIDER_SITE_OTHER): Payer: Medicare Other | Admitting: *Deleted

## 2011-09-19 DIAGNOSIS — I2699 Other pulmonary embolism without acute cor pulmonale: Secondary | ICD-10-CM

## 2011-09-19 DIAGNOSIS — Z86718 Personal history of other venous thrombosis and embolism: Secondary | ICD-10-CM

## 2011-09-19 DIAGNOSIS — Z7901 Long term (current) use of anticoagulants: Secondary | ICD-10-CM

## 2011-09-19 LAB — POCT INR: INR: 2.2

## 2011-09-21 ENCOUNTER — Other Ambulatory Visit (INDEPENDENT_AMBULATORY_CARE_PROVIDER_SITE_OTHER): Payer: Medicare Other

## 2011-09-21 ENCOUNTER — Ambulatory Visit (INDEPENDENT_AMBULATORY_CARE_PROVIDER_SITE_OTHER): Payer: Medicare Other | Admitting: Internal Medicine

## 2011-09-21 ENCOUNTER — Encounter: Payer: Self-pay | Admitting: Internal Medicine

## 2011-09-21 VITALS — BP 142/78 | HR 78 | Temp 98.7°F | Ht 65.0 in | Wt 165.0 lb

## 2011-09-21 DIAGNOSIS — R7309 Other abnormal glucose: Secondary | ICD-10-CM

## 2011-09-21 DIAGNOSIS — F329 Major depressive disorder, single episode, unspecified: Secondary | ICD-10-CM

## 2011-09-21 DIAGNOSIS — Z0000 Encounter for general adult medical examination without abnormal findings: Secondary | ICD-10-CM

## 2011-09-21 DIAGNOSIS — J449 Chronic obstructive pulmonary disease, unspecified: Secondary | ICD-10-CM

## 2011-09-21 DIAGNOSIS — R7302 Impaired glucose tolerance (oral): Secondary | ICD-10-CM

## 2011-09-21 DIAGNOSIS — J019 Acute sinusitis, unspecified: Secondary | ICD-10-CM

## 2011-09-21 LAB — BASIC METABOLIC PANEL
CO2: 26 mEq/L (ref 19–32)
Calcium: 9.4 mg/dL (ref 8.4–10.5)
Creatinine, Ser: 0.5 mg/dL (ref 0.4–1.2)
GFR: 115.62 mL/min (ref >60.00)
Sodium: 140 mEq/L (ref 135–145)

## 2011-09-21 LAB — HEMOGLOBIN A1C: Hgb A1c MFr Bld: 5.7 % (ref 4.6–6.5)

## 2011-09-21 MED ORDER — LEVOFLOXACIN 250 MG PO TABS: 250.0000 mg | ORAL_TABLET | Freq: Every day | ORAL | Status: AC

## 2011-09-21 NOTE — Patient Instructions (Signed)
Take all new medications as prescribed Continue all other medications as before Please go to LAB in the Basement for the blood and/or urine tests to be done today Please call the phone number [phone] (the [employer]Tree System) for results of testing in 2-3 days;  When calling, simply dial the number, and when prompted enter the MRN number above (the Medical Record Number) and the # key, then the message should start. Please return in 6 months, or sooner if needed 

## 2011-09-22 ENCOUNTER — Encounter: Payer: Self-pay | Admitting: Internal Medicine

## 2011-09-22 DIAGNOSIS — Z0000 Encounter for general adult medical examination without abnormal findings: Secondary | ICD-10-CM | POA: Insufficient documentation

## 2011-09-22 NOTE — Assessment & Plan Note (Signed)
Mild to mod, for antibx course,  to f/u any worsening symptoms or concerns 

## 2011-09-22 NOTE — Assessment & Plan Note (Signed)
stable overall by hx and exam, most recent data reviewed with pt, and pt to continue medical treatment as before  Lab Results  Component Value Date   HGBA1C 5.7 09/21/2011   For labs today in f/u

## 2011-09-22 NOTE — Assessment & Plan Note (Signed)
Verified nonsuicidal, mild to mod it seems, pt declines tx such as ssri

## 2011-09-22 NOTE — Progress Notes (Signed)
Subjective:    Patient ID: [name], female    DOB: 08-Mar-1930, 77 y.o.   MRN: [id number]  HPI   Here with 3 days acute onset fever, facial pain, pressure, general weakness and malaise, and greenish d/c, with slight ST, but little to no cough and Pt denies chest pain, increased sob or doe, wheezing, orthopnea, PND, increased LE swelling, palpitations, dizziness or syncope.  Pt denies new neurological symptoms such as new headache, or facial or extremity weakness or numbness   Pt denies polydipsia, polyuria.  Pt states overall good compliance with meds, trying to follow lower cholesterol, diabetic diet, wt overall stable but little exercise however.     Denies worsening depressive symptoms, suicidal ideation, or panic, though has ongoing anxiety, not increased recently, did not want to really start the celexa after last visit so did not   Pt denies fever, wt loss, night sweats, loss of appetite, or other constitutional symptoms except for the above Past Medical History  Diagnosis Date  . VIRAL INFECTION 05/07/2008  . HYPERLIPIDEMIA 08/05/2007  . ANXIETY 08/05/2007  . DEPRESSION 08/05/2007  . COMMON MIGRAINE 08/05/2007  . Unspecified hearing loss 10/17/2007  . HYPERTENSION 03/11/2007  . CORONARY ARTERY DISEASE 04/06/2009  . PULMONARY HYPERTENSION, SECONDARY 01/15/2008  . CAROTID ARTERY STENOSIS, RIGHT 08/05/2007  . SINUSITIS- ACUTE-NOS 10/17/2007  . URI 07/15/2008  . Chronic rhinitis 11/22/2008  . ALLERGIC RHINITIS 08/05/2007  . COPD 03/31/2008  . GERD 03/11/2007  . DIVERTICULOSIS, COLON 08/05/2007  . CYST/PSEUDOCYST, PANCREAS 08/05/2007  . HEMATOCHEZIA 05/07/2008  . OVERACTIVE BLADDER 05/26/2009  . CELLULITIS, LEG, LEFT 03/25/2009  . SKIN LESION 04/06/2009  . OSTEOARTHRITIS, KNEE, RIGHT 08/05/2007  . OSTEOPOROSIS 03/11/2007  . DIZZINESS, CHRONIC 10/14/2009  . GAIT IMBALANCE 11/26/2007  . FATIGUE 07/15/2008  . WEIGHT LOSS-ABNORMAL 06/06/2010  . Headache 11/26/2007  . DYSPNEA 12/30/2007  . NAUSEA  06/06/2010  . FLATULENCE-GAS-BLOATING 07/03/2010  . Dysuria 05/27/2008  . FREQUENCY, URINARY 03/25/2009  . ABDOMINAL PAIN-PERIUMBILICAL 06/06/2010  . ABDOMINAL PAIN-EPIGASTRIC 06/06/2010  . ABNORMAL EXAM-BILIARY TRACT 06/06/2010  . ABNORMAL FINDINGS GI TRACT 07/03/2010  . TRANSIENT ISCHEMIC ATTACK, HX OF 03/11/2007  . PULMONARY EMBOLISM, HX OF 08/05/2007  . COLONIC POLYPS, HX OF 04/06/2009  . Personal History of Other Diseases of Digestive Disease 08/05/2007  . Pulmonary embolism 12/08/2010   Past Surgical History  Procedure Date  . Abdominal hysterectomy   . Egd 1999  . Cholecystectomy   . Tonsillectomy   . Appendectomy   . S/p sinus surgury     reports that she has never smoked. She does not have any smokeless tobacco history on file. She reports that she does not drink alcohol or use illicit drugs. family history includes Cancer in her daughter. Allergies  Allergen Reactions  . Ciclesonide     REACTION: bad smell  . WUJ:WJXBJYNWGNF+AOZHYQMVH+QIONGEXBMW Acid+Aspartame     REACTION: diarrhea  . Raloxifene     REACTION: risk of recurrent stroke  . Sulfonamide Derivatives     REACTION: tongue swells   Current Outpatient Prescriptions on File Prior to Visit  Medication Sig Dispense Refill  . amLODipine (NORVASC) 5 MG tablet Take 5 mg by mouth daily.        [name] aspirin 81 MG EC tablet Take 81 mg by mouth daily.        . calcium carbonate (TUMS) 500 MG chewable tablet Chew 1 tablet by mouth daily as needed.        . Digestive Enzymes (PAPAYA AND ENZYMES)  CHEW Chew by mouth. Chew 2 tablets with meals as needed       . loratadine (CLARITIN) 10 MG tablet Take 10 mg by mouth daily.        [name] losartan (COZAAR) 100 MG tablet Take 100 mg by mouth daily.        . mometasone (NASONEX) 50 MCG/ACT nasal spray Place 2 sprays into the nose daily as needed.       . Multiple Vitamins-Minerals (MACUVITE) TABS Take 1 tablet by mouth daily.        . pantoprazole (PROTONIX) 40 MG tablet Take 40 mg by mouth  daily as needed.        . promethazine (PHENERGAN) 25 MG tablet Take 25 mg by mouth. Take 1/2 tablet every 6-8 hours as needed for nausea       . warfarin (COUMADIN) 5 MG tablet TAKE 1 TABLET EVERY DAY  30 tablet  11   Review of Systems Review of Systems  Constitutional: Negative for diaphoresis and unexpected weight change.  HENT: Negative for drooling and tinnitus.   Eyes: Negative for photophobia and visual disturbance.  Respiratory: Negative for choking and stridor.   Gastrointestinal: Negative for vomiting and blood in stool.  Genitourinary: Negative for hematuria and decreased urine volume.      Objective:   Physical Exam BP 142/78  Pulse 78  Temp(Src) 98.7 F (37.1 C) (Oral)  Ht 5\' 5"  (1.651 m)  Wt 165 lb (74.844 kg)  BMI 27.46 kg/m2  SpO2 88% Physical Exam  VS noted, mild ill Constitutional: Pt appears well-developed and well-nourished.  HENT: Head: Normocephalic.  Right Ear: External ear normal.  Left Ear: External ear normal.  Bilat tm's mild erythema.  Sinus tender bilat.  Pharynx mild erythema Eyes: Conjunctivae and EOM are normal. Pupils are equal, round, and reactive to light.  Neck: Normal range of motion. Neck supple.  Cardiovascular: Normal rate and regular rhythm.   Pulmonary/Chest: Effort normal and breath sounds normal.  - no rales or wheezing Neurological: Pt is alert. No cranial nerve deficit.  Skin: Skin is warm. No erythema.  Psychiatric: Pt behavior is normal. Thought content normal. 1+ nervous, depressed affect        Assessment & Plan:

## 2011-09-22 NOTE — Assessment & Plan Note (Addendum)
o2 sat decreased today, pt states no symptoms or sob/wheezing most recent data reviewed with pt, and pt to continue medical treatment as before, f/u for any worsening, declines cxr  SpO2 Readings from Last 3 Encounters:  09/21/11 88%  08/14/11 96%  05/21/11 95%

## 2011-10-11 ENCOUNTER — Encounter: Payer: Self-pay | Admitting: Internal Medicine

## 2011-10-11 ENCOUNTER — Ambulatory Visit (INDEPENDENT_AMBULATORY_CARE_PROVIDER_SITE_OTHER): Payer: Medicare Other | Admitting: Internal Medicine

## 2011-10-11 VITALS — BP 172/82 | HR 87 | Temp 97.6°F | Ht 65.0 in | Wt 163.5 lb

## 2011-10-11 DIAGNOSIS — R7309 Other abnormal glucose: Secondary | ICD-10-CM

## 2011-10-11 DIAGNOSIS — R062 Wheezing: Secondary | ICD-10-CM

## 2011-10-11 DIAGNOSIS — J209 Acute bronchitis, unspecified: Secondary | ICD-10-CM | POA: Diagnosis not present

## 2011-10-11 DIAGNOSIS — R7302 Impaired glucose tolerance (oral): Secondary | ICD-10-CM

## 2011-10-11 MED ORDER — AZITHROMYCIN 250 MG PO TABS: ORAL_TABLET | ORAL | Status: AC

## 2011-10-11 MED ORDER — PREDNISONE 10 MG PO TABS: ORAL_TABLET | ORAL | Status: DC

## 2011-10-11 MED ORDER — METHYLPREDNISOLONE ACETATE PF 80 MG/ML IJ SUSP
120.0000 mg | Freq: Once | INTRAMUSCULAR | Status: AC
  Administered 2011-10-11: 120 mg via INTRAMUSCULAR

## 2011-10-11 MED ORDER — HYDROCODONE-HOMATROPINE 5-1.5 MG/5ML PO SYRP: 5.0000 mL | ORAL_SOLUTION | Freq: Four times a day (QID) | ORAL | Status: AC | PRN

## 2011-10-11 NOTE — Patient Instructions (Addendum)
You had the steroid shot today, and the Breathing Treatment Take all new medications as prescribed - the antibiotic, cough medicine, and prednisone Continue all other medications as before - including the Home Oxygen as needed at 2L, and the Inhaler

## 2011-10-12 ENCOUNTER — Other Ambulatory Visit: Payer: Self-pay | Admitting: Internal Medicine

## 2011-10-14 ENCOUNTER — Encounter: Payer: Self-pay | Admitting: Internal Medicine

## 2011-10-14 NOTE — Progress Notes (Signed)
Subjective:    Patient ID: [name], female    DOB: [birth year], 78 y.o.   MRN: [id number]  HPI  Here with acute onset mild to mod 2 wks ST, HA, general weakness and malaise, with prod cough greenish sputum, but Pt denies chest pain, increased sob or doe, wheezing, orthopnea, PND, increased LE swelling, palpitations, dizziness or syncope, except for worsening mild wheezing onset in the last 2 days, helped with inhaler.  Pt denies new neurological symptoms such as new headache, or facial or extremity weakness or numbness   Pt denies polydipsia, polyuria,  Pt states overall good compliance with meds.  No recent overt bleeding or bruising on the coumadin Past Medical History  Diagnosis Date  . VIRAL INFECTION 05/07/2008  . HYPERLIPIDEMIA 08/05/2007  . ANXIETY 08/05/2007  . DEPRESSION 08/05/2007  . COMMON MIGRAINE 08/05/2007  . Unspecified hearing loss 10/17/2007  . HYPERTENSION 03/11/2007  . CORONARY ARTERY DISEASE 04/06/2009  . PULMONARY HYPERTENSION, SECONDARY 01/15/2008  . CAROTID ARTERY STENOSIS, RIGHT 08/05/2007  . SINUSITIS- ACUTE-NOS 10/17/2007  . URI 07/15/2008  . Chronic rhinitis 11/22/2008  . ALLERGIC RHINITIS 08/05/2007  . COPD 03/31/2008  . GERD 03/11/2007  . DIVERTICULOSIS, COLON 08/05/2007  . CYST/PSEUDOCYST, PANCREAS 08/05/2007  . HEMATOCHEZIA 05/07/2008  . OVERACTIVE BLADDER 05/26/2009  . CELLULITIS, LEG, LEFT 03/25/2009  . SKIN LESION 04/06/2009  . OSTEOARTHRITIS, KNEE, RIGHT 08/05/2007  . OSTEOPOROSIS 03/11/2007  . DIZZINESS, CHRONIC 10/14/2009  . GAIT IMBALANCE 11/26/2007  . FATIGUE 07/15/2008  . WEIGHT LOSS-ABNORMAL 06/06/2010  . Headache 11/26/2007  . DYSPNEA 12/30/2007  . NAUSEA 06/06/2010  . FLATULENCE-GAS-BLOATING 07/03/2010  . Dysuria 05/27/2008  . FREQUENCY, URINARY 03/25/2009  . ABDOMINAL PAIN-PERIUMBILICAL 06/06/2010  . ABDOMINAL PAIN-EPIGASTRIC 06/06/2010  . ABNORMAL EXAM-BILIARY TRACT 06/06/2010  . ABNORMAL FINDINGS GI TRACT 07/03/2010  . TRANSIENT ISCHEMIC ATTACK, HX OF  03/11/2007  . PULMONARY EMBOLISM, HX OF 08/05/2007  . COLONIC POLYPS, HX OF 04/06/2009  . Personal History of Other Diseases of Digestive Disease 08/05/2007  . Pulmonary embolism 12/08/2010   Past Surgical History  Procedure Date  . Abdominal hysterectomy   . Egd 1999  . Cholecystectomy   . Tonsillectomy   . Appendectomy   . S/p sinus surgury     reports that she has never smoked. She does not have any smokeless tobacco history on file. She reports that she does not drink alcohol or use illicit drugs. family history includes Cancer in her daughter. Allergies  Allergen Reactions  . Ciclesonide     REACTION: bad smell  . WUJ:WJXBJYNWGNF+AOZHYQMVH+QIONGEXBMW Acid+Aspartame     REACTION: diarrhea  . Raloxifene     REACTION: risk of recurrent stroke  . Sulfonamide Derivatives     REACTION: tongue swells   Current Outpatient Prescriptions on File Prior to Visit  Medication Sig Dispense Refill  . amLODipine (NORVASC) 5 MG tablet Take 5 mg by mouth daily.        [name] aspirin 81 MG EC tablet Take 81 mg by mouth daily.        . calcium carbonate (TUMS) 500 MG chewable tablet Chew 1 tablet by mouth daily as needed.        . Digestive Enzymes (PAPAYA AND ENZYMES) CHEW Chew by mouth. Chew 2 tablets with meals as needed       . loratadine (CLARITIN) 10 MG tablet Take 10 mg by mouth daily.        . mometasone (NASONEX) 50 MCG/ACT nasal spray Place 2 sprays into the nose  daily as needed.       . Multiple Vitamins-Minerals (MACUVITE) TABS Take 1 tablet by mouth daily.        . pantoprazole (PROTONIX) 40 MG tablet Take 40 mg by mouth daily as needed.        . promethazine (PHENERGAN) 25 MG tablet Take 25 mg by mouth. Take 1/2 tablet every 6-8 hours as needed for nausea       . warfarin (COUMADIN) 5 MG tablet TAKE 1 TABLET EVERY DAY  30 tablet  11   Review of Systems Review of Systems  Constitutional: Negative for diaphoresis and unexpected weight change.  HENT: Negative for drooling and tinnitus.     Eyes: Negative for photophobia and visual disturbance.  Respiratory: Negative for choking and stridor.   Gastrointestinal: Negative for vomiting and blood in stool.  Genitourinary: Negative for hematuria and decreased urine volume.     Objective:   Physical Exam BP 172/82  Pulse 87  Temp(Src) 97.6 F (36.4 C) (Oral)  Ht 5\' 5"  (1.651 m)  Wt 163 lb 8 oz (74.163 kg)  BMI 27.21 kg/m2  SpO2 88% Physical Exam  VS noted, mild ill Constitutional: Pt appears well-developed and well-nourished.  HENT: Head: Normocephalic.  Right Ear: External ear normal.  Left Ear: External ear normal.  Bilat tm's mild erythema.  Sinus nontender.  Pharynx mild erythema Eyes: Conjunctivae and EOM are normal. Pupils are equal, round, and reactive to light.  Neck: Normal range of motion. Neck supple.  Cardiovascular: Normal rate and regular rhythm.   Pulmonary/Chest: Effort normal and breath sounds mild decresed, with bilat wheeze, no rales.  Neurological: Pt is alert. No cranial nerve deficit.  Skin: Skin is warm. No erythema.  Psychiatric: Pt behavior is normal. Thought content normal. 1+ nervous    Assessment & Plan:

## 2011-10-14 NOTE — Assessment & Plan Note (Signed)
Mild to mod, for antibx course,  to f/u any worsening symptoms or concerns 

## 2011-10-14 NOTE — Assessment & Plan Note (Signed)
stable overall by hx and exam, most recent data reviewed with pt, and pt to continue medical treatment as before  Lab Results  Component Value Date   HGBA1C 5.7 09/21/2011

## 2011-10-14 NOTE — Assessment & Plan Note (Signed)
Mild to mod, for predpack asd,  to f/u any worsening symptoms or concerns 

## 2011-10-17 ENCOUNTER — Ambulatory Visit (INDEPENDENT_AMBULATORY_CARE_PROVIDER_SITE_OTHER): Payer: Medicare Other | Admitting: *Deleted

## 2011-10-17 ENCOUNTER — Telehealth: Payer: Self-pay

## 2011-10-17 DIAGNOSIS — Z86718 Personal history of other venous thrombosis and embolism: Secondary | ICD-10-CM

## 2011-10-17 DIAGNOSIS — I2699 Other pulmonary embolism without acute cor pulmonale: Secondary | ICD-10-CM

## 2011-10-17 DIAGNOSIS — Z7901 Long term (current) use of anticoagulants: Secondary | ICD-10-CM | POA: Diagnosis not present

## 2011-10-17 LAB — POCT INR: INR: 3.7

## 2011-10-17 NOTE — Telephone Encounter (Signed)
Pt was seen in [facility] today, pt inquiring if she needs to establish with a cardiologist given she has been diagnosed with pulmonary HTN, and she has a strong family hx of heart disease.  Her mother had her first MI at this age, and she is now deceased from heart disease.  Advised pt I would send a note to Dr Delton Coombes for his recommendations of need and who he would recommend she establish with given her diagnosis of pulmonary HTN.  Pt is aware, advised her I would call her back with his recommendations.  Thanks

## 2011-10-25 NOTE — Telephone Encounter (Signed)
I think it would be a good idea for Ms S to see cardiology, although not urgent that she do so. I'd like her to be seen at [facility] if this suits her. Thank you very much for taking such good care of her! RB

## 2011-10-29 NOTE — Telephone Encounter (Signed)
Advised pt per Dr Delton Coombes although not an urgent need to establish with a cardiologist, he does recommend pt establish with a [place] cardiologist preferably. Pt understands and WCB to est.

## 2011-10-31 ENCOUNTER — Ambulatory Visit (INDEPENDENT_AMBULATORY_CARE_PROVIDER_SITE_OTHER): Payer: Medicare Other | Admitting: *Deleted

## 2011-10-31 DIAGNOSIS — Z7901 Long term (current) use of anticoagulants: Secondary | ICD-10-CM

## 2011-10-31 DIAGNOSIS — Z86718 Personal history of other venous thrombosis and embolism: Secondary | ICD-10-CM

## 2011-10-31 DIAGNOSIS — I2699 Other pulmonary embolism without acute cor pulmonale: Secondary | ICD-10-CM

## 2011-10-31 LAB — POCT INR: INR: 3.2

## 2011-11-02 ENCOUNTER — Ambulatory Visit: Payer: Medicare Other | Admitting: Endocrinology

## 2011-11-02 ENCOUNTER — Telehealth: Payer: Self-pay | Admitting: Emergency Medicine

## 2011-11-02 NOTE — Telephone Encounter (Signed)
[name] has printed off the ov note from 05/2010 that has been requested by [facility].  This has been faxed to [facility] per their request.

## 2011-11-02 NOTE — Telephone Encounter (Signed)
lmomtcb x1 

## 2011-11-02 NOTE — Telephone Encounter (Signed)
[name] returned call from triage. [name]

## 2011-11-02 NOTE — Telephone Encounter (Signed)
Called and spoke with [name] and she needs the oxygen with the dx of copd for the dated from June through august  2011.  Fax  # [phone] to fax these papers and rachels extension is [phone].   lori please advise .  thanks

## 2011-11-05 ENCOUNTER — Ambulatory Visit (INDEPENDENT_AMBULATORY_CARE_PROVIDER_SITE_OTHER): Payer: Medicare Other | Admitting: Internal Medicine

## 2011-11-05 ENCOUNTER — Encounter: Payer: Self-pay | Admitting: Internal Medicine

## 2011-11-05 ENCOUNTER — Other Ambulatory Visit (INDEPENDENT_AMBULATORY_CARE_PROVIDER_SITE_OTHER): Payer: Medicare Other

## 2011-11-05 ENCOUNTER — Ambulatory Visit (INDEPENDENT_AMBULATORY_CARE_PROVIDER_SITE_OTHER)
Admission: RE | Admit: 2011-11-05 | Discharge: 2011-11-05 | Disposition: A | Discharge status: Home / Self Care | Payer: Medicare Other | Source: Ambulatory Visit | Attending: Internal Medicine | Admitting: Internal Medicine

## 2011-11-05 VITALS — BP 150/72 | HR 82 | Temp 97.6°F | Resp 20

## 2011-11-05 DIAGNOSIS — R918 Other nonspecific abnormal finding of lung field: Secondary | ICD-10-CM | POA: Diagnosis not present

## 2011-11-05 DIAGNOSIS — J209 Acute bronchitis, unspecified: Secondary | ICD-10-CM

## 2011-11-05 DIAGNOSIS — R05 Cough: Secondary | ICD-10-CM

## 2011-11-05 DIAGNOSIS — R059 Cough, unspecified: Secondary | ICD-10-CM

## 2011-11-05 DIAGNOSIS — J984 Other disorders of lung: Secondary | ICD-10-CM | POA: Diagnosis not present

## 2011-11-05 DIAGNOSIS — I2699 Other pulmonary embolism without acute cor pulmonale: Secondary | ICD-10-CM

## 2011-11-05 DIAGNOSIS — R079 Chest pain, unspecified: Secondary | ICD-10-CM | POA: Diagnosis not present

## 2011-11-05 LAB — CBC WITH DIFFERENTIAL/PLATELET
Basophils Absolute: 0 10*3/uL (ref 0.0–0.1)
Eosinophils Absolute: 0.1 10*3/uL (ref 0.0–0.7)
Lymphocytes Relative: 18.1 % (ref 12.0–46.0)
MCHC: 33.2 g/dL (ref 30.0–36.0)
Monocytes Relative: 6.3 % (ref 3.0–12.0)
Neutrophils Relative %: 74.6 % (ref 43.0–77.0)
Platelets: 245 10*3/uL (ref 150.0–400.0)
RDW: 13.1 % (ref 11.5–14.6)

## 2011-11-05 LAB — CARDIAC PANEL
CK-MB: 1.3 ng/mL (ref 0.3–4.0)
Relative Index: 3.4 calc — ABNORMAL HIGH (ref 0.0–2.5)

## 2011-11-05 MED ORDER — CEFUROXIME AXETIL 500 MG PO TABS: 500.0000 mg | ORAL_TABLET | Freq: Two times a day (BID) | ORAL | Status: AC

## 2011-11-05 NOTE — Assessment & Plan Note (Signed)
I will check her CXR and a d-dimer today

## 2011-11-05 NOTE — Assessment & Plan Note (Signed)
Her EKG today appears unchanged from her prior EKG of 08/12, she has benign appearing low voltage in her precordial leads so I think her pain is atypical, perhaps MS from the coughing, none the less I will check a CPK level today to look for ischemia

## 2011-11-05 NOTE — Assessment & Plan Note (Signed)
I will check a CXR to look for pna, mass, edema, etc. 

## 2011-11-05 NOTE — Assessment & Plan Note (Signed)
I will offer ceftin to treat the infection

## 2011-11-05 NOTE — Patient Instructions (Signed)
Acute Bronchitis You have acute bronchitis. This means you have a chest cold. The airways in your lungs are red and sore (inflamed). Acute means it is sudden onset.  CAUSES Bronchitis is most often caused by the same virus that causes a cold. SYMPTOMS   Body aches.   Chest congestion.   Chills.   Cough.   Fever.   Shortness of breath.   Sore throat.  TREATMENT  Acute bronchitis is usually treated with rest, fluids, and medicines for relief of fever or cough. Most symptoms should go away after a few days or a week. Increased fluids may help thin your secretions and will prevent dehydration. Your caregiver may give you an inhaler to improve your symptoms. The inhaler reduces shortness of breath and helps control cough. You can take over-the-counter pain relievers or cough medicine to decrease coughing, pain, or fever. A cool-air vaporizer may help thin bronchial secretions and make it easier to clear your chest. Antibiotics are usually not needed but can be prescribed if you smoke, are seriously ill, have chronic lung problems, are elderly, or you are at higher risk for developing complications.Allergies and asthma can make bronchitis worse. Repeated episodes of bronchitis may cause longstanding lung problems. Avoid smoking and secondhand smoke.Exposure to cigarette smoke or irritating chemicals will make bronchitis worse. If you are a cigarette smoker, consider using nicotine gum or skin patches to help control withdrawal symptoms. Quitting smoking will help your lungs heal faster. Recovery from bronchitis is often slow, but you should start feeling better after 2 to 3 days. Cough from bronchitis frequently lasts for 3 to 4 weeks. To prevent another bout of acute bronchitis:  Quit smoking.   Wash your hands frequently to get rid of viruses or use a hand sanitizer.   Avoid other people with cold or virus symptoms.   Try not to touch your hands to your mouth, nose, or eyes.  SEEK  IMMEDIATE MEDICAL CARE IF:  You develop increased fever, chills, or chest pain.   You have severe shortness of breath or bloody sputum.   You develop dehydration, fainting, repeated vomiting, or a severe headache.   You have no improvement after 1 week of treatment or you get worse.  MAKE SURE YOU:   Understand these instructions.   Will watch your condition.   Will get help right away if you are not doing well or get worse.  Document Released: 11/01/2004 Document Revised: 06/06/2011 Document Reviewed: 01/17/2011 Exit[employer] Patient Information 2012 [employer].Chest Pain (Nonspecific) It is often hard to give a specific diagnosis for the cause of chest pain. There is always a chance that your pain could be related to something serious, such as a heart attack or a blood clot in the lungs. You need to follow up with your caregiver for further evaluation. CAUSES   Heartburn.   Pneumonia or bronchitis.   Anxiety and stress.   Inflammation around your heart (pericarditis) or lung (pleuritis or pleurisy).   A blood clot in the lung.   A collapsed lung (pneumothorax). It can develop suddenly on its own (spontaneous pneumothorax) or from injury (trauma) to the chest.  The chest wall is composed of bones, muscles, and cartilage. Any of these can be the source of the pain.  The bones can be bruised by injury.   The muscles or cartilage can be strained by coughing or overwork.   The cartilage can be affected by inflammation and become sore (costochondritis).  DIAGNOSIS  Lab tests or   other studies, such as X-rays, an EKG, stress testing, or cardiac imaging, may be needed to find the cause of your pain.  TREATMENT   Treatment depends on what may be causing your chest pain. Treatment may include:   Acid blockers for heartburn.   Anti-inflammatory medicine.   Pain medicine for inflammatory conditions.   Antibiotics if an infection is present.   You may be advised to change  lifestyle habits. This includes stopping smoking and avoiding caffeine and chocolate.   You may be advised to keep your head raised (elevated) when sleeping. This reduces the chance of acid going backward from your stomach into your esophagus.   Most of the time, nonspecific chest pain will improve within 2 to 3 days with rest and mild pain medicine.  HOME CARE INSTRUCTIONS   If antibiotics were prescribed, take the full amount even if you start to feel better.   For the next few days, avoid physical activities that bring on chest pain. Continue physical activities as directed.   Do not smoke cigarettes or drink alcohol until your symptoms are gone.   Only take over-the-counter or prescription medicine for pain, discomfort, or fever as directed by your caregiver.   Follow your caregiver's suggestions for further testing if your chest pain does not go away.   Keep any follow-up appointments you made. If you do not go to an appointment, you could develop lasting (chronic) problems with pain. If there is any problem keeping an appointment, you must call to reschedule.  SEEK MEDICAL CARE IF:   You think you are having problems from the medicine you are taking. Read your medicine instructions carefully.   Your chest pain does not go away, even after treatment.   You develop a rash with blisters on your chest.  SEEK IMMEDIATE MEDICAL CARE IF:   You have increased chest pain or pain that spreads to your arm, neck, jaw, back, or belly (abdomen).   You develop shortness of breath, an increasing cough, or you are coughing up blood.   You have severe back or abdominal pain, feel sick to your stomach (nauseous) or throw up (vomit).   You develop severe weakness, fainting, or chills.   You have an oral temperature above 102 F (38.9 C), not controlled by medicine.  THIS IS AN EMERGENCY. Do not wait to see if the pain will go away. Get medical help at once. Call your local emergency services  (911 in [place] Do not drive yourself to the hospital. MAKE SURE YOU:   Understand these instructions.   Will watch your condition.   Will get help right away if you are not doing well or get worse.  Document Released: 07/04/2005 Document Revised: 06/06/2011 Document Reviewed: 04/29/2008 Exit[employer] Patient Information 2012 [employer]. 

## 2011-11-05 NOTE — Progress Notes (Signed)
Subjective:    Patient ID: [name], female    DOB: [birth year], 78 y.o.   MRN: [id number]  Cough This is a recurrent problem. The current episode started 1 to 4 weeks ago. The problem has been gradually worsening. The problem occurs every few hours. The cough is productive of purulent sputum. Associated symptoms include chest pain and chills. Pertinent negatives include no ear congestion, ear pain, fever, headaches, heartburn, hemoptysis, myalgias, nasal congestion, postnasal drip, rash, rhinorrhea, sore throat, shortness of breath, sweats, weight loss or wheezing. The symptoms are aggravated by nothing. She has tried oral steroids (zpak) for the symptoms. The treatment provided no relief.  Chest Pain  This is a new problem. The current episode started 1 to 4 weeks ago. The onset quality is gradual. The problem occurs intermittently. The problem has been unchanged. The pain is present in the substernal region. The pain is at a severity of 2/10. The pain is mild. The quality of the pain is described as pressure. The pain radiates to the mid back. Associated symptoms include a cough. Pertinent negatives include no abdominal pain, back pain, claudication, diaphoresis, dizziness, exertional chest pressure, fever, headaches, hemoptysis, irregular heartbeat, leg pain, lower extremity edema, malaise/fatigue, nausea, near-syncope, numbness, orthopnea, palpitations, PND, shortness of breath, sputum production, syncope, vomiting or weakness. The cough is productive. Cough relieved by: cough suppressant. The pain is aggravated by nothing. She has tried nothing for the symptoms.  Pertinent negatives for past medical history include no seizures.      Review of Systems  Constitutional: Positive for chills. Negative for fever, weight loss, malaise/fatigue, diaphoresis, activity change, appetite change, fatigue and unexpected weight change.  HENT: Negative for ear pain, nosebleeds, congestion, sore throat,  facial swelling, rhinorrhea, neck pain, neck stiffness and postnasal drip.   Eyes: Negative.   Respiratory: Positive for cough. Negative for apnea, hemoptysis, sputum production, choking, chest tightness, shortness of breath, wheezing and stridor.   Cardiovascular: Positive for chest pain. Negative for palpitations, orthopnea, claudication, leg swelling, syncope, PND and near-syncope.  Gastrointestinal: Negative for heartburn, nausea, vomiting, abdominal pain, constipation and blood in stool.  Genitourinary: Negative for dysuria, urgency, frequency, hematuria, decreased urine volume, enuresis, difficulty urinating and dyspareunia.  Musculoskeletal: Negative for myalgias, back pain, joint swelling, arthralgias and gait problem.  Skin: Negative for color change, pallor, rash and wound.  Neurological: Negative for dizziness, tremors, seizures, syncope, facial asymmetry, speech difficulty, weakness, light-headedness, numbness and headaches.  Hematological: Negative for adenopathy. Does not bruise/bleed easily.  Psychiatric/Behavioral: Negative.        Objective:   Physical Exam  Vitals reviewed. Constitutional: She is oriented to person, place, and time. She appears well-developed and well-nourished. No distress.  HENT:  Head: Normocephalic and atraumatic. No trismus in the jaw.  Right Ear: Hearing, tympanic membrane, external ear and ear canal normal.  Left Ear: Hearing, tympanic membrane, external ear and ear canal normal.  Nose: Nose normal. No mucosal edema, rhinorrhea, nose lacerations, sinus tenderness, nasal deformity, septal deviation or nasal septal hematoma. No epistaxis.  No foreign bodies. Right sinus exhibits no maxillary sinus tenderness and no frontal sinus tenderness. Left sinus exhibits no maxillary sinus tenderness and no frontal sinus tenderness.  Mouth/Throat: Oropharynx is clear and moist and mucous membranes are normal. Mucous membranes are not pale, not dry and not cyanotic.  No uvula swelling. No oropharyngeal exudate, posterior oropharyngeal edema, posterior oropharyngeal erythema or tonsillar abscesses.  Eyes: Conjunctivae are normal. Right eye exhibits no discharge. Left eye exhibits  no discharge. No scleral icterus.  Neck: Normal range of motion. Neck supple. No JVD present. No tracheal deviation present. No thyromegaly present.  Cardiovascular: Normal rate, regular rhythm and intact distal pulses.  Exam reveals no gallop and no friction rub.   Murmur heard. Pulmonary/Chest: Effort normal and breath sounds normal. No stridor. No respiratory distress. She has no wheezes. She has no rales. She exhibits no tenderness.  Abdominal: Soft. Bowel sounds are normal. She exhibits no distension and no mass. There is no tenderness. There is no rebound and no guarding.  Musculoskeletal: Normal range of motion. She exhibits no edema and no tenderness.  Lymphadenopathy:    She has no cervical adenopathy.  Neurological: She is oriented to person, place, and time.  Skin: Skin is warm and dry. No rash noted. She is not diaphoretic. No erythema. No pallor.  Psychiatric: She has a normal mood and affect. Her behavior is normal. Judgment and thought content normal.          Assessment & Plan:

## 2011-11-06 ENCOUNTER — Telehealth: Payer: Self-pay | Admitting: Internal Medicine

## 2011-11-06 LAB — D-DIMER, QUANTITATIVE: D-Dimer, Quant: 0.23 ug/mL-FEU (ref 0.00–0.48)

## 2011-11-06 NOTE — Telephone Encounter (Signed)
Informed patient of results and mailed a copy to her home

## 2011-11-06 NOTE — Telephone Encounter (Signed)
Patient called and left voice message requesting the results of her lab test.

## 2011-11-06 NOTE — Telephone Encounter (Signed)
Jan 28 labs per Dr Yetta Barre - normal

## 2011-11-21 ENCOUNTER — Ambulatory Visit (INDEPENDENT_AMBULATORY_CARE_PROVIDER_SITE_OTHER): Payer: Medicare Other | Admitting: Cardiovascular Disease

## 2011-11-21 ENCOUNTER — Encounter: Payer: Self-pay | Admitting: Cardiovascular Disease

## 2011-11-21 ENCOUNTER — Ambulatory Visit (INDEPENDENT_AMBULATORY_CARE_PROVIDER_SITE_OTHER): Payer: Medicare Other | Admitting: *Deleted

## 2011-11-21 VITALS — BP 141/73 | HR 65 | Ht 65.5 in | Wt 161.0 lb

## 2011-11-21 DIAGNOSIS — I2699 Other pulmonary embolism without acute cor pulmonale: Secondary | ICD-10-CM

## 2011-11-21 DIAGNOSIS — I2789 Other specified pulmonary heart diseases: Secondary | ICD-10-CM | POA: Diagnosis not present

## 2011-11-21 DIAGNOSIS — Z7901 Long term (current) use of anticoagulants: Secondary | ICD-10-CM

## 2011-11-21 DIAGNOSIS — Z86718 Personal history of other venous thrombosis and embolism: Secondary | ICD-10-CM

## 2011-11-21 DIAGNOSIS — I272 Pulmonary hypertension, unspecified: Secondary | ICD-10-CM

## 2011-11-21 NOTE — Progress Notes (Signed)
[name] Date of Birth  Nov 19, 1929 [facility]     [place] Office  [address]    [address]   [address] [place], [place]  [address]    [place], [place]  [address] [phone]  Fax  [phone]  [phone]  Fax [phone]  Problem List: 1. Pulmonary Embolus 2. Pulmonary Hypertension - presumable due to pulmonary emboli, unless echocardiogram in November, 2000 level revealed estimated PA pressures in the 65-70 range 3. Chest pain   History of Present Illness:  Shiley presents with recent worsening of chest pains.  The pain is a dull sensation.  It comes and goes.  It has been present for the past 3-4 weeks.  It is not pleuretic.  Not associated with diaphoresis or dizziness.  She has also noticed generalized fatigue.  She does not get any regular exercise - she becomes becomes quite short of breath with her pulmonary hypertension.     Current Outpatient Prescriptions on File Prior to Visit  Medication Sig Dispense Refill  . amLODipine (NORVASC) 5 MG tablet Take 5 mg by mouth daily.        [name] aspirin 81 MG EC tablet Take 81 mg by mouth daily.        . calcium carbonate (TUMS) 500 MG chewable tablet Chew 1 tablet by mouth daily as needed.        . Digestive Enzymes (PAPAYA AND ENZYMES) CHEW Chew by mouth. Chew 2 tablets with meals as needed       . loratadine (CLARITIN) 10 MG tablet Take 10 mg by mouth daily.        . mometasone (NASONEX) 50 MCG/ACT nasal spray Place 2 sprays into the nose daily as needed.       . Multiple Vitamins-Minerals (MACUVITE) TABS Take 1 tablet by mouth daily.        . pantoprazole (PROTONIX) 40 MG tablet Take 40 mg by mouth daily as needed.        . warfarin (COUMADIN) 5 MG tablet TAKE 1 TABLET EVERY DAY  30 tablet  11    Allergies  Allergen Reactions  . Ciclesonide     REACTION: bad smell  . UUV:[id number]+[id number] Acid+Aspartame     REACTION: diarrhea  . Raloxifene     REACTION: risk of recurrent  stroke  . Sulfonamide Derivatives     REACTION: tongue swells    Past Medical History  Diagnosis Date  . VIRAL INFECTION 05/07/2008  . HYPERLIPIDEMIA 08/05/2007  . ANXIETY 08/05/2007  . DEPRESSION 08/05/2007  . COMMON MIGRAINE 08/05/2007  . Unspecified hearing loss 10/17/2007  . HYPERTENSION 03/11/2007  . CORONARY ARTERY DISEASE 04/06/2009  . PULMONARY HYPERTENSION, SECONDARY 01/15/2008  . CAROTID ARTERY STENOSIS, RIGHT 08/05/2007  . SINUSITIS- ACUTE-NOS 10/17/2007  . URI 07/15/2008  . Chronic rhinitis 11/22/2008  . ALLERGIC RHINITIS 08/05/2007  . COPD 03/31/2008  . GERD 03/11/2007  . DIVERTICULOSIS, COLON 08/05/2007  . CYST/PSEUDOCYST, PANCREAS 08/05/2007  . HEMATOCHEZIA 05/07/2008  . OVERACTIVE BLADDER 05/26/2009  . CELLULITIS, LEG, LEFT 03/25/2009  . SKIN LESION 04/06/2009  . OSTEOARTHRITIS, KNEE, RIGHT 08/05/2007  . OSTEOPOROSIS 03/11/2007  . DIZZINESS, CHRONIC 10/14/2009  . GAIT IMBALANCE 11/26/2007  . FATIGUE 07/15/2008  . WEIGHT LOSS-ABNORMAL 06/06/2010  . Headache 11/26/2007  . DYSPNEA 12/30/2007  . NAUSEA 06/06/2010  . FLATULENCE-GAS-BLOATING 07/03/2010  . Dysuria 05/27/2008  . FREQUENCY, URINARY 03/25/2009  . ABDOMINAL PAIN-PERIUMBILICAL 06/06/2010  . ABDOMINAL PAIN-EPIGASTRIC 06/06/2010  . ABNORMAL EXAM-BILIARY TRACT 06/06/2010  .  ABNORMAL FINDINGS GI TRACT 07/03/2010  . TRANSIENT ISCHEMIC ATTACK, HX OF 03/11/2007  . PULMONARY EMBOLISM, HX OF 08/05/2007  . COLONIC POLYPS, HX OF 04/06/2009  . Personal History of Other Diseases of Digestive Disease 08/05/2007  . Pulmonary embolism 12/08/2010    Past Surgical History  Procedure Date  . Abdominal hysterectomy   . Egd 1999  . Cholecystectomy   . Tonsillectomy   . Appendectomy   . S/p sinus surgury     History  Smoking status  . Never Smoker   Smokeless tobacco  . Not on file    History  Alcohol Use No    Family History  Problem Relation Age of Onset  . Cancer Daughter     breast cancer    Reviw of Systems:  Reviewed in  the HPI.  All other systems are negative.  Physical Exam: Blood pressure 141/73, pulse 65, height 5' 5.5" (1.664 m), weight 161 lb (73.029 kg). General: Well developed, well nourished, in no acute distress.  Head: Normocephalic, atraumatic, sclera non-icteric, mucus membranes are moist,   Neck: Supple. Negative for carotid bruits. JVD not elevated.  Lungs: Clear bilaterally to auscultation without wheezes, rales, or rhonchi. Breathing is unlabored.  Heart: RRR with S1 S2. No murmurs, rubs, or gallops appreciated.  Abdomen: Soft, non-tender, non-distended with normoactive bowel sounds. No hepatomegaly. No rebound/guarding. No obvious abdominal masses.  Msk:  Strength and tone appear normal for age.  Extremities: No clubbing or cyanosis. No edema.  Distal pedal pulses are 2+ and equal bilaterally.  Neuro: Alert and oriented X 3. Moves all extremities spontaneously.  Psych:  Responds to questions appropriately with a normal affect.  ECG: Normal sinus rhythm. There is no evidence of cor pulmonale.  Assessment / Plan:

## 2011-11-21 NOTE — Patient Instructions (Signed)
Your physician recommends that you schedule a follow-up appointment in: 1 MONTH  Your physician has requested that you have an echocardiogram. Echocardiography is a painless test that uses sound waves to create images of your heart. It provides your doctor with information about the size and shape of your heart and how well your heart's chambers and valves are working. This procedure takes approximately one hour. There are no restrictions for this procedure.  DR NAHSER WANTS YOU TO WEAR O2 AT ALL TIMES ALSO ASK [facility] ABOUT [employer] O2 SUPPLY,

## 2011-11-21 NOTE — Assessment & Plan Note (Addendum)
[name] presents with episodes of chest pain and a history of secondary pulmonary hypertension. She's had pulmonary emboli and is still on Coumadin.  She has a relatively low oxygen saturation at rest-90%. Her oxygen saturation drops fairly quickly after walking about 180 feet. She had a nadir O2 sat rate of 79%.  We discussed the possibility of doing a right and left heart catheterization with a vasodilator challenge. This might help [name] predict whether or not she'll improve with vasodilators. We will  consider  Sildenafil.    I've asked her to wear her oxygen on a continuous basis. This will help her with her port hypertension. She will contact her oxygen supplier and ask about the [employer] system. We will get a repeat echocardiogram for further assessment of her right ventricle and pulmonary pressures. I'll see her again in one month.

## 2011-11-28 ENCOUNTER — Ambulatory Visit (HOSPITAL_COMMUNITY): Payer: Medicare Other | Attending: Cardiovascular Disease

## 2011-11-28 ENCOUNTER — Other Ambulatory Visit: Payer: Self-pay

## 2011-11-28 DIAGNOSIS — I319 Disease of pericardium, unspecified: Secondary | ICD-10-CM | POA: Diagnosis not present

## 2011-11-28 DIAGNOSIS — I379 Nonrheumatic pulmonary valve disorder, unspecified: Secondary | ICD-10-CM | POA: Diagnosis not present

## 2011-11-28 DIAGNOSIS — I079 Rheumatic tricuspid valve disease, unspecified: Secondary | ICD-10-CM | POA: Diagnosis not present

## 2011-11-28 DIAGNOSIS — E785 Hyperlipidemia, unspecified: Secondary | ICD-10-CM | POA: Insufficient documentation

## 2011-11-28 DIAGNOSIS — I059 Rheumatic mitral valve disease, unspecified: Secondary | ICD-10-CM | POA: Insufficient documentation

## 2011-11-28 DIAGNOSIS — I270 Primary pulmonary hypertension: Secondary | ICD-10-CM | POA: Diagnosis not present

## 2011-11-28 DIAGNOSIS — I2510 Atherosclerotic heart disease of native coronary artery without angina pectoris: Secondary | ICD-10-CM | POA: Insufficient documentation

## 2011-11-28 DIAGNOSIS — I272 Pulmonary hypertension, unspecified: Secondary | ICD-10-CM

## 2011-11-29 ENCOUNTER — Other Ambulatory Visit (INDEPENDENT_AMBULATORY_CARE_PROVIDER_SITE_OTHER): Payer: Medicare Other

## 2011-11-29 ENCOUNTER — Encounter: Payer: Self-pay | Admitting: Internal Medicine

## 2011-11-29 ENCOUNTER — Other Ambulatory Visit: Payer: Self-pay | Admitting: Internal Medicine

## 2011-11-29 ENCOUNTER — Ambulatory Visit (INDEPENDENT_AMBULATORY_CARE_PROVIDER_SITE_OTHER): Payer: Medicare Other | Admitting: Internal Medicine

## 2011-11-29 ENCOUNTER — Ambulatory Visit (INDEPENDENT_AMBULATORY_CARE_PROVIDER_SITE_OTHER)
Admission: RE | Admit: 2011-11-29 | Discharge: 2011-11-29 | Disposition: A | Discharge status: Home / Self Care | Payer: Medicare Other | Source: Ambulatory Visit | Attending: Internal Medicine | Admitting: Internal Medicine

## 2011-11-29 VITALS — BP 162/82 | HR 81 | Temp 97.7°F | Ht 65.0 in | Wt 161.2 lb

## 2011-11-29 DIAGNOSIS — R5383 Other fatigue: Secondary | ICD-10-CM

## 2011-11-29 DIAGNOSIS — I10 Essential (primary) hypertension: Secondary | ICD-10-CM

## 2011-11-29 DIAGNOSIS — R05 Cough: Secondary | ICD-10-CM | POA: Diagnosis not present

## 2011-11-29 DIAGNOSIS — J189 Pneumonia, unspecified organism: Secondary | ICD-10-CM

## 2011-11-29 DIAGNOSIS — R5381 Other malaise: Secondary | ICD-10-CM

## 2011-11-29 LAB — BASIC METABOLIC PANEL
BUN: 16 mg/dL (ref 6–23)
Creatinine, Ser: 0.6 mg/dL (ref 0.4–1.2)
GFR: 104.34 mL/min (ref >60.00)
Glucose, Bld: 73 mg/dL (ref 70–99)
Potassium: 4.4 mEq/L (ref 3.5–5.1)

## 2011-11-29 LAB — CBC WITH DIFFERENTIAL/PLATELET
Basophils Relative: 0.1 % (ref 0.0–3.0)
Eosinophils Absolute: 0 10*3/uL (ref 0.0–0.7)
HCT: 43.3 % (ref 36.0–46.0)
Hemoglobin: 14.1 g/dL (ref 12.0–15.0)
Lymphocytes Relative: 22.3 % (ref 12.0–46.0)
Lymphs Abs: 2.3 10*3/uL (ref 0.7–4.0)
MCHC: 32.5 g/dL (ref 30.0–36.0)
MCV: 95.8 fl (ref 78.0–100.0)
Neutro Abs: 7.2 10*3/uL (ref 1.4–7.7)
RBC: 4.51 Mil/uL (ref 3.87–5.11)

## 2011-11-29 LAB — HEPATIC FUNCTION PANEL
ALT: 26 U/L (ref 0–35)
AST: 23 U/L (ref 0–37)
Albumin: 3.7 g/dL (ref 3.5–5.2)
Total Bilirubin: 0.6 mg/dL (ref 0.3–1.2)

## 2011-11-29 MED ORDER — AZITHROMYCIN 250 MG PO TABS: ORAL_TABLET | ORAL | Status: DC

## 2011-11-29 MED ORDER — LEVOFLOXACIN 250 MG PO TABS: 250.0000 mg | ORAL_TABLET | Freq: Every day | ORAL | Status: AC

## 2011-11-29 NOTE — Progress Notes (Signed)
Subjective:    Patient ID: [name], female    DOB: 05/09/1930, 78 y.o.   MRN: [id number]  HPI  Here to f/u RML pna;  Overall doing "ok" but c/o ongoing fatigue out of proportion to recent illness she thinks after tx jan 28 with ceftin course;  Overall chest discomfort some improved but still mild persistent, still with cough somewhat greenish sputum, mild sob/doe, and not wearing her o2 today since cant handle the tank she has, makes her breathing worse with exertion;  Asks for labs and f/u cxr,  Denies worsening depressive symptoms, suicidal ideation, or panic, though has ongoing anxiety.   Does have sense of ongoing fatigue, but denies signficant hypersomnolence. Has seen cardiology with echo yesterday, results pending.  Pt denies new neurological symptoms such as new headache, or facial or extremity weakness or numbness   Pt denies polydipsia, polyuria, Past Medical History  Diagnosis Date  . VIRAL INFECTION 05/07/2008  . HYPERLIPIDEMIA 08/05/2007  . ANXIETY 08/05/2007  . DEPRESSION 08/05/2007  . COMMON MIGRAINE 08/05/2007  . Unspecified hearing loss 10/17/2007  . HYPERTENSION 03/11/2007  . CORONARY ARTERY DISEASE 04/06/2009  . PULMONARY HYPERTENSION, SECONDARY 01/15/2008  . CAROTID ARTERY STENOSIS, RIGHT 08/05/2007  . SINUSITIS- ACUTE-NOS 10/17/2007  . URI 07/15/2008  . Chronic rhinitis 11/22/2008  . ALLERGIC RHINITIS 08/05/2007  . COPD 03/31/2008  . GERD 03/11/2007  . DIVERTICULOSIS, COLON 08/05/2007  . CYST/PSEUDOCYST, PANCREAS 08/05/2007  . HEMATOCHEZIA 05/07/2008  . OVERACTIVE BLADDER 05/26/2009  . CELLULITIS, LEG, LEFT 03/25/2009  . SKIN LESION 04/06/2009  . OSTEOARTHRITIS, KNEE, RIGHT 08/05/2007  . OSTEOPOROSIS 03/11/2007  . DIZZINESS, CHRONIC 10/14/2009  . GAIT IMBALANCE 11/26/2007  . FATIGUE 07/15/2008  . WEIGHT LOSS-ABNORMAL 06/06/2010  . Headache 11/26/2007  . DYSPNEA 12/30/2007  . NAUSEA 06/06/2010  . FLATULENCE-GAS-BLOATING 07/03/2010  . Dysuria 05/27/2008  . FREQUENCY, URINARY  03/25/2009  . ABDOMINAL PAIN-PERIUMBILICAL 06/06/2010  . ABDOMINAL PAIN-EPIGASTRIC 06/06/2010  . ABNORMAL EXAM-BILIARY TRACT 06/06/2010  . ABNORMAL FINDINGS GI TRACT 07/03/2010  . TRANSIENT ISCHEMIC ATTACK, HX OF 03/11/2007  . PULMONARY EMBOLISM, HX OF 08/05/2007  . COLONIC POLYPS, HX OF 04/06/2009  . Personal History of Other Diseases of Digestive Disease 08/05/2007  . Pulmonary embolism 12/08/2010   Past Surgical History  Procedure Date  . Abdominal hysterectomy   . Egd 1999  . Cholecystectomy   . Tonsillectomy   . Appendectomy   . S/p sinus surgury     reports that she has never smoked. She does not have any smokeless tobacco history on file. She reports that she does not drink alcohol or use illicit drugs. family history includes Cancer in her daughter. Allergies  Allergen Reactions  . Ciclesonide     REACTION: bad smell  . WUJ:[id number]+[id number] Acid+Aspartame     REACTION: diarrhea  . Raloxifene     REACTION: risk of recurrent stroke  . Sulfonamide Derivatives     REACTION: tongue swells   Current Outpatient Prescriptions on File Prior to Visit  Medication Sig Dispense Refill  . amLODipine (NORVASC) 5 MG tablet Take 5 mg by mouth daily.        [name] aspirin 81 MG EC tablet Take 81 mg by mouth daily.        . calcium carbonate (TUMS) 500 MG chewable tablet Chew 1 tablet by mouth daily as needed.        . Digestive Enzymes (PAPAYA AND ENZYMES) CHEW Chew by mouth. Chew 2 tablets with meals as needed       .  loratadine (CLARITIN) 10 MG tablet Take 10 mg by mouth daily.        . mometasone (NASONEX) 50 MCG/ACT nasal spray Place 2 sprays into the nose daily as needed.       . Multiple Vitamins-Minerals (MACUVITE) TABS Take 1 tablet by mouth daily.        . pantoprazole (PROTONIX) 40 MG tablet Take 40 mg by mouth daily as needed.        . warfarin (COUMADIN) 5 MG tablet TAKE 1 TABLET EVERY DAY  30 tablet  11   Review of Systems Review of Systems  Constitutional: Negative  for diaphoresis and unexpected weight change.  HENT: Negative for drooling and tinnitus.   Eyes: Negative for photophobia and visual disturbance.  Respiratory: Negative for choking and stridor.   Gastrointestinal: Negative for vomiting and blood in stool.  Genitourinary: Negative for hematuria and decreased urine volume.     Objective:   Physical Exam BP 162/82  Pulse 81  Temp(Src) 97.7 F (36.5 C) (Oral)  Ht 5\' 5"  (1.651 m)  Wt 161 lb 4 oz (73.143 kg)  BMI 26.83 kg/m2  SpO2 93% Physical Exam  VS noted Constitutional: Pt appears well-developed and well-nourished.  HENT: Head: Normocephalic.  Right Ear: External ear normal.  Left Ear: External ear normal.  Eyes: Conjunctivae and EOM are normal. Pupils are equal, round, and reactive to light.  Neck: Normal range of motion. Neck supple.  Cardiovascular: Normal rate and regular rhythm.   Pulmonary/Chest: Effort normal and breath sounds mild decreased bilat, no wheeze or rales Neurological: Pt is alert. No cranial nerve deficit.  Skin: Skin is warm. No erythema.  Psychiatric: Pt behavior is normal. Thought content normal. 1+ nervous    Assessment & Plan:

## 2011-11-29 NOTE — Assessment & Plan Note (Signed)
Etiology unclear, Exam otherwise benign, to check labs as documented, follow with expectant management  

## 2011-11-29 NOTE — Patient Instructions (Signed)
Take all new medications as prescribed Continue all other medications as before Please go to XRAY in the Basement for the x-ray test Please go to LAB in the Basement for the blood and/or urine tests to be done today Please call the phone number [phone] (the [employer] System) for results of testing in 2-3 days;  When calling, simply dial the number, and when prompted enter the MRN number above (the Medical Record Number) and the # key, then the message should start.  

## 2011-11-29 NOTE — Assessment & Plan Note (Signed)
With some persistent symptoms ? Clinical signfiicance, ok for levaquin course, also f/u cxr

## 2011-11-29 NOTE — Assessment & Plan Note (Signed)
Mild elev likely situationl, stable overall by hx and exam, most recent data reviewed with pt, and pt to continue medical treatment as before  BP Readings from Last 3 Encounters:  11/29/11 162/82  11/21/11 141/73  11/05/11 150/72

## 2011-12-07 ENCOUNTER — Other Ambulatory Visit: Payer: Self-pay | Admitting: Internal Medicine

## 2011-12-07 ENCOUNTER — Ambulatory Visit (INDEPENDENT_AMBULATORY_CARE_PROVIDER_SITE_OTHER)
Admission: RE | Admit: 2011-12-07 | Discharge: 2011-12-07 | Disposition: A | Discharge status: Home / Self Care | Payer: Medicare Other | Source: Ambulatory Visit | Attending: Internal Medicine | Admitting: Internal Medicine

## 2011-12-07 ENCOUNTER — Telehealth: Payer: Self-pay | Admitting: Internal Medicine

## 2011-12-07 DIAGNOSIS — R918 Other nonspecific abnormal finding of lung field: Secondary | ICD-10-CM | POA: Diagnosis not present

## 2011-12-07 DIAGNOSIS — M549 Dorsalgia, unspecified: Secondary | ICD-10-CM | POA: Diagnosis not present

## 2011-12-07 DIAGNOSIS — R9389 Abnormal findings on diagnostic imaging of other specified body structures: Secondary | ICD-10-CM

## 2011-12-07 DIAGNOSIS — R05 Cough: Secondary | ICD-10-CM | POA: Diagnosis not present

## 2011-12-07 DIAGNOSIS — J189 Pneumonia, unspecified organism: Secondary | ICD-10-CM

## 2011-12-07 NOTE — Telephone Encounter (Signed)
Left message on phone tree - study still with RML opacity  1) ok for CT chest with IV contrast, bun/cr one wk ago normal  Robin to inform pt, I will do order

## 2011-12-11 ENCOUNTER — Ambulatory Visit (INDEPENDENT_AMBULATORY_CARE_PROVIDER_SITE_OTHER)
Admission: RE | Admit: 2011-12-11 | Discharge: 2011-12-11 | Disposition: A | Discharge status: Home / Self Care | Payer: Medicare Other | Source: Ambulatory Visit | Attending: Internal Medicine | Admitting: Internal Medicine

## 2011-12-11 ENCOUNTER — Ambulatory Visit (INDEPENDENT_AMBULATORY_CARE_PROVIDER_SITE_OTHER): Payer: Medicare Other | Admitting: *Deleted

## 2011-12-11 ENCOUNTER — Encounter: Payer: Self-pay | Admitting: Internal Medicine

## 2011-12-11 DIAGNOSIS — R9389 Abnormal findings on diagnostic imaging of other specified body structures: Secondary | ICD-10-CM

## 2011-12-11 DIAGNOSIS — R918 Other nonspecific abnormal finding of lung field: Secondary | ICD-10-CM

## 2011-12-11 DIAGNOSIS — J984 Other disorders of lung: Secondary | ICD-10-CM | POA: Diagnosis not present

## 2011-12-11 DIAGNOSIS — I2699 Other pulmonary embolism without acute cor pulmonale: Secondary | ICD-10-CM

## 2011-12-11 DIAGNOSIS — Z7901 Long term (current) use of anticoagulants: Secondary | ICD-10-CM | POA: Diagnosis not present

## 2011-12-11 DIAGNOSIS — J479 Bronchiectasis, uncomplicated: Secondary | ICD-10-CM | POA: Diagnosis not present

## 2011-12-11 DIAGNOSIS — J9819 Other pulmonary collapse: Secondary | ICD-10-CM | POA: Diagnosis not present

## 2011-12-11 MED ORDER — IOHEXOL 300 MG/ML  SOLN
80.0000 mL | Freq: Once | INTRAMUSCULAR | Status: AC | PRN
  Administered 2011-12-11: 80 mL via INTRAVENOUS

## 2011-12-18 ENCOUNTER — Telehealth: Payer: Self-pay

## 2011-12-18 NOTE — Telephone Encounter (Signed)
Will forward to Dr Sherene Sires in order to alert him to pt concern

## 2011-12-18 NOTE — Telephone Encounter (Signed)
The patient informed that she has an appointment with Dr. Sherene Sires on Thursday 12/20/11 at 10:00AM.  She is concerned he would not be informed of the recent test she has had done.  Informed the patient we are on the same system and he would have access to the her recent OV with JWJ,  xray and CT results. The patient is concerned about her situation and would like to be assured the MD on Thursday is well informed as she still does not feel well and wants to get better. Please advise

## 2011-12-18 NOTE — Telephone Encounter (Signed)
Patient informed. 

## 2011-12-20 ENCOUNTER — Ambulatory Visit (INDEPENDENT_AMBULATORY_CARE_PROVIDER_SITE_OTHER): Payer: Medicare Other | Admitting: Internal Medicine

## 2011-12-20 ENCOUNTER — Encounter: Payer: Self-pay | Admitting: Internal Medicine

## 2011-12-20 VITALS — BP 140/70 | HR 71 | Temp 97.8°F | Ht 65.5 in | Wt 162.0 lb

## 2011-12-20 DIAGNOSIS — J479 Bronchiectasis, uncomplicated: Secondary | ICD-10-CM | POA: Diagnosis not present

## 2011-12-20 DIAGNOSIS — J9610 Chronic respiratory failure, unspecified whether with hypoxia or hypercapnia: Secondary | ICD-10-CM | POA: Diagnosis not present

## 2011-12-20 DIAGNOSIS — I2789 Other specified pulmonary heart diseases: Secondary | ICD-10-CM | POA: Diagnosis not present

## 2011-12-20 MED ORDER — PANTOPRAZOLE SODIUM 40 MG PO TBEC: 40.0000 mg | DELAYED_RELEASE_TABLET | Freq: Every day | ORAL | Status: DC | PRN

## 2011-12-20 MED ORDER — PANTOPRAZOLE SODIUM 40 MG PO TBEC: DELAYED_RELEASE_TABLET | ORAL | Status: DC

## 2011-12-20 NOTE — Patient Instructions (Signed)
Bronchiectasis =   you have scarring of your bronchial tubes which means that they don't function perfectly normally and mucus tends to pool in certain areas of your lung which can cause pneumonia and further scarring of your lung and bronchial tubes  Whenever you develop cough congestion take mucinex or mucinex dm > these will help keep the mucus loose and flowing but if your condition worsens you need to seek help immediately preferably here or somewhere inside the [facility] system to compare xrays ( worse = darker or bloody mucus or pain on breathing in)   Protonix (pantoprozole) 40 mg Take 30-60 min before first meal of the day   GERD (REFLUX)  is an extremely common cause of respiratory symptoms, many times with no significant heartburn at all.    It can be treated with medication, but also with lifestyle changes including avoidance of late meals, excessive alcohol, smoking cessation, and avoid fatty foods, chocolate, peppermint, colas, red wine, and acidic juices such as orange juice.  NO MINT OR MENTHOL PRODUCTS SO NO COUGH DROPS  USE SUGARLESS CANDY INSTEAD (jolley ranchers or Stover's)  NO OIL BASED VITAMINS - use powdered substitutes.    Please schedule a follow up office visit in 4 weeks, sooner if needed with pft's on return

## 2011-12-20 NOTE — Progress Notes (Signed)
Subjective:    Patient ID: [name], female    DOB: 08-24-1930  MRN: [id number]  HPI  41 yowf never smoker, allergic rhinitis, hx bilat PE in September 2008,with asociated Pulm HTN (PASP 70 with intact LV fxn). Started on Spiriva after PFT's showed mild to mod AFL without BD response; she stopped this due to dry eyes, L eye pain.  12/20/10 self referred to [facility] for eval of sob.  12/20/10  ov/ [name]  cc sob x 2 months gradual onset progressive to point where has trouble vacuuming or scrubbing the floors better when wears 02 as was told to do.  Does not wear 02 at hs. Says only has" large tank"  at home. "No better"  with abx though acutally was assoc with cough that was productive of green now white mostly in am but then still cough all day long.   Sleeping ok without nocturnal  Cough  or early am exacerbation  of respiratory  c/o's or need for noct saba. Also denies any obvious fluctuation of symptoms with weather or environmental changes or other aggravating or alleviating factors except as outlined above    Review of Systems  Constitutional: Positive for chills. Negative for fever and unexpected weight change.  HENT: Positive for congestion, sore throat, rhinorrhea, sneezing, postnasal drip and sinus pressure. Negative for ear pain, nosebleeds, trouble swallowing, dental problem and voice change.   Eyes: Negative for visual disturbance.  Respiratory: Positive for cough and shortness of breath. Negative for choking.   Cardiovascular: Negative for chest pain and leg swelling.  Gastrointestinal: Positive for diarrhea. Negative for vomiting and abdominal pain.  Genitourinary: Negative for difficulty urinating.  Musculoskeletal: Positive for arthralgias.  Skin: Negative for rash.  Neurological: Positive for headaches. Negative for tremors and syncope.  Hematological: Does not bruise/bleed easily.       Objective:   Physical Exam  amb wf Patient failed to answer a single question  asked in a straightforward manner, tending to go off on tangents or answer questions with ambiguous medical terms or diagnoses and seemed perplexed  when asked the same question more than once for clarification.  Wt Readings from Last 3 Encounters:  12/20/11 162 lb (73.483 kg)  11/29/11 161 lb 4 oz (73.143 kg)  11/21/11 161 lb (73.029 kg)   HEENT: nl dentition, turbinates, and orophanx. Nl external ear canals without cough reflex   NECK :  without JVD/Nodes/TM/ nl carotid upstrokes bilaterally   LUNGS: no acc muscle use, clear to A and P bilaterally without cough on insp or exp maneuvers   CV:  RRR  no s3 or murmur or increase in P2, no edema   ABD:  soft and nontender with nl excursion in the supine position. No bruits or organomegaly, bowel sounds nl  MS:  warm without deformities, calf tenderness, cyanosis or clubbing  SKIN: warm and dry without lesions    NEURO:  alert, approp, no deficits   12/07/11 1. Waxing and waning reticulonodular interstitial opacities in the  lungs over the last 5 years, with tree in bud morphology suggesting  atypical infectious bronchiolitis and airway plugging.  2. Bronchiectasis and progressive atelectasis in the right middle  lobe of the last 5 years, likely secondary to recurrent infection.  Given the overall volume loss, I am doubtful of malignancy in this  vicinity, and active pneumonia is not observed.  3. Mosaic attenuation with reduced conspicuity of vessels in the  more lucent areas of lungs, favoring either chronic  pulmonary  embolus or obstructive bronchiolitis.  4. Coronary artery atherosclerosis       Assessment & Plan:

## 2011-12-21 ENCOUNTER — Institutional Professional Consult (permissible substitution): Payer: Medicare Other | Admitting: Internal Medicine

## 2011-12-21 ENCOUNTER — Ambulatory Visit (INDEPENDENT_AMBULATORY_CARE_PROVIDER_SITE_OTHER): Payer: Medicare Other | Admitting: *Deleted

## 2011-12-21 DIAGNOSIS — I2699 Other pulmonary embolism without acute cor pulmonale: Secondary | ICD-10-CM | POA: Diagnosis not present

## 2011-12-21 DIAGNOSIS — Z7901 Long term (current) use of anticoagulants: Secondary | ICD-10-CM

## 2011-12-21 DIAGNOSIS — J9611 Chronic respiratory failure with hypoxia: Secondary | ICD-10-CM | POA: Insufficient documentation

## 2011-12-21 LAB — POCT INR: INR: 1.9

## 2011-12-21 NOTE — Assessment & Plan Note (Signed)
I had an extended discussion with the patient today lasting 15 to 20 minutes of a 25 minute visit on the following issues:  Dx and pathophysiology discussed > See instructions for specific recommendations which were reviewed directly with the patient who was given a copy with highlighter outlining the key components.

## 2011-12-21 NOTE — Assessment & Plan Note (Signed)
Patient Saturations on Room Air at Rest = 87% [name], [facility] 08/14/11...   She is qualified for 24 hour 02 and needs to work with her CME company to be more compliant as this is the most important aspect of treating her PAH

## 2011-12-21 NOTE — Assessment & Plan Note (Signed)
Not wearing 02 as directed as recently as 11/21/11 by Dr Melburn Popper, not sure why "just has a big tank" will have our [facility] troubleshoot this and ask she be titrated on portable 02 to sat consistently > 90

## 2011-12-31 ENCOUNTER — Telehealth: Payer: Self-pay | Admitting: *Deleted

## 2011-12-31 NOTE — Telephone Encounter (Signed)
Normal LV function. Markedly elevated Pulmonary pressures - known already ( due to bilateral pulmonary emboli).    Results from Dr Elease Hashimoto from echo.

## 2012-01-01 ENCOUNTER — Ambulatory Visit: Payer: Medicare Other | Admitting: Internal Medicine

## 2012-01-02 ENCOUNTER — Ambulatory Visit: Payer: Medicare Other | Admitting: Cardiovascular Disease

## 2012-01-07 ENCOUNTER — Encounter: Payer: Self-pay | Admitting: Internal Medicine

## 2012-01-10 ENCOUNTER — Ambulatory Visit (INDEPENDENT_AMBULATORY_CARE_PROVIDER_SITE_OTHER): Payer: Medicare Other | Admitting: Internal Medicine

## 2012-01-10 ENCOUNTER — Encounter: Payer: Self-pay | Admitting: Internal Medicine

## 2012-01-10 VITALS — BP 145/60 | HR 85 | Temp 97.8°F | Resp 16 | Ht 65.0 in | Wt 164.0 lb

## 2012-01-10 DIAGNOSIS — S46819A Strain of other muscles, fascia and tendons at shoulder and upper arm level, unspecified arm, initial encounter: Secondary | ICD-10-CM

## 2012-01-10 DIAGNOSIS — J479 Bronchiectasis, uncomplicated: Secondary | ICD-10-CM

## 2012-01-10 DIAGNOSIS — S43499A Other sprain of unspecified shoulder joint, initial encounter: Secondary | ICD-10-CM | POA: Diagnosis not present

## 2012-01-10 DIAGNOSIS — F411 Generalized anxiety disorder: Secondary | ICD-10-CM

## 2012-01-10 MED ORDER — TIZANIDINE HCL 4 MG PO TABS: 4.0000 mg | ORAL_TABLET | Freq: Four times a day (QID) | ORAL | Status: AC | PRN

## 2012-01-10 NOTE — Patient Instructions (Signed)
Take all new medications as prescribed Continue all other medications as before Please keep your appointments with your specialists as you have planned - Dr Sherene Sires
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
## 2012-01-12 ENCOUNTER — Encounter: Payer: Self-pay | Admitting: Internal Medicine

## 2012-01-12 NOTE — Assessment & Plan Note (Signed)
Pt voiced many concerns, stable overall by hx and exam, most recent data reviewed with pt, and pt to continue medical treatment as before SpO2 Readings from Last 3 Encounters:  01/10/12 90%  12/20/11 91%  11/29/11 93%

## 2012-01-12 NOTE — Assessment & Plan Note (Signed)
stable overall by hx and exam, , and pt to continue medical treatment as before   

## 2012-01-12 NOTE — Assessment & Plan Note (Signed)
Mild to mod, for tizanidine prn  to f/u any worsening symptoms or concerns

## 2012-01-16 ENCOUNTER — Telehealth: Payer: Self-pay | Admitting: Internal Medicine

## 2012-01-16 NOTE — Telephone Encounter (Signed)
Per MW- ONO results on 2 lpm were okay and should stay on o2 at hs.  LMTCB
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
## 2012-01-21 DIAGNOSIS — H353190 Nonexudative age-related macular degeneration, unspecified eye, stage unspecified: Secondary | ICD-10-CM | POA: Diagnosis not present

## 2012-01-21 DIAGNOSIS — Z961 Presence of intraocular lens: Secondary | ICD-10-CM | POA: Diagnosis not present

## 2012-01-21 DIAGNOSIS — H35039 Hypertensive retinopathy, unspecified eye: Secondary | ICD-10-CM | POA: Diagnosis not present

## 2012-01-21 DIAGNOSIS — H43399 Other vitreous opacities, unspecified eye: Secondary | ICD-10-CM | POA: Diagnosis not present

## 2012-01-22 ENCOUNTER — Encounter: Payer: Self-pay | Admitting: Internal Medicine

## 2012-01-24 ENCOUNTER — Encounter: Payer: Self-pay | Admitting: Internal Medicine

## 2012-01-24 ENCOUNTER — Ambulatory Visit (INDEPENDENT_AMBULATORY_CARE_PROVIDER_SITE_OTHER): Payer: Medicare Other | Admitting: Internal Medicine

## 2012-01-24 VITALS — BP 152/68 | HR 73 | Temp 98.3°F | Wt 156.8 lb

## 2012-01-24 DIAGNOSIS — I2789 Other specified pulmonary heart diseases: Secondary | ICD-10-CM | POA: Diagnosis not present

## 2012-01-24 DIAGNOSIS — J479 Bronchiectasis, uncomplicated: Secondary | ICD-10-CM

## 2012-01-24 DIAGNOSIS — J9610 Chronic respiratory failure, unspecified whether with hypoxia or hypercapnia: Secondary | ICD-10-CM | POA: Diagnosis not present

## 2012-01-24 DIAGNOSIS — J310 Chronic rhinitis: Secondary | ICD-10-CM | POA: Diagnosis not present

## 2012-01-24 MED ORDER — DOXYCYCLINE HYCLATE 100 MG PO TABS: 100.0000 mg | ORAL_TABLET | Freq: Two times a day (BID) | ORAL | Status: AC

## 2012-01-24 NOTE — Assessment & Plan Note (Signed)
Adequate control on present rx, reviewed  

## 2012-01-24 NOTE — Patient Instructions (Addendum)
Doxycycline 100 mg one twice daily  X 10 days and let the coumadin clinic know you're on it  Keep your follow up appt with Dr Delton Coombes and we'll see you here if needed.

## 2012-01-24 NOTE — Progress Notes (Signed)
Subjective:    Patient ID: [name], female    DOB: August 10, 1930   MRN: [id number]  HPI  50 yowf never smoker, allergic rhinitis, hx bilat PE in September 2008,with asociated Pulm HTN (PASP 70 with intact LV fxn). Started on Spiriva after PFT's showed mild to mod AFL without BD response; she stopped this due to dry eyes, L eye pain.  12/20/11 self referred to [facility] for eval of sob.  12/20/11  ov/ Monisha Siebel  cc sob x 2 months gradual onset progressive to point where has trouble vacuuming or scrubbing the floors better when wears 02 as was told to do.  Does not wear 02 at hs. Says only has" large tank"  at home. "No better"  with abx though acutally was assoc with cough that was productive of green now white mostly in am but then still cough all day long.  rec Bronchiectasis =   you have scarring of your bronchial tubes which means that they don't function perfectly normally and mucus tends to pool in certain areas of your lung which can cause pneumonia and further scarring of your lung and bronchial tubes Whenever you develop cough congestion take mucinex or mucinex dm > these will help keep the mucus loose and flowing but if your condition worsens you need to seek help immediately preferably here or somewhere inside the [facility] system to compare xrays ( worse = darker or bloody mucus or pain on breathing in)  Protonix (pantoprozole) 40 mg Take 30-60 min before first meal of the day  GERD (REFLUX)   01/24/2012 f/u ov/Karen Huhta cc Prod cough (Green ) for 3 weeks off and on - Sinus drainage - Head congestion - Soreness in L face - SOB on exertion but not really limiting desired activities.   Sleeping ok without nocturnal  Cough  or early am exacerbation  of respiratory  c/o's or need for noct saba. Also denies any obvious fluctuation of symptoms with weather or environmental changes or other aggravating or alleviating factors except as outlined above .  ROS  At present neg for  any significant sore throat,  dysphagia, dental problems, itching, sneezing,   ear ache,   fever, chills, sweats, unintended wt loss, pleuritic or exertional cp, hemoptysis, palpitations, orthopnea pnd or leg swelling.  Also denies presyncope, palpitations, heartburn, abdominal pain, anorexia, nausea, vomiting, diarrhea  or change in bowel or urinary habits, change in stools or urine, dysuria,hematuria,  rash, arthralgias, visual complaints, headache, numbness weakness or ataxia or problems with walking or coordination. No noted change in mood/affect or memory.                  Objective:   Physical Exam  amb wf  Wt 01/24/2012  156 Wt Readings from Last 3 Encounters:  12/20/11 162 lb (73.483 kg)  11/29/11 161 lb 4 oz (73.143 kg)  11/21/11 161 lb (73.029 kg)   HEENT: nl dentition, turbinates, and orophanx. Nl external ear canals without cough reflex   NECK :  without JVD/Nodes/TM/ nl carotid upstrokes bilaterally   LUNGS: no acc muscle use, clear to A and P bilaterally without cough on insp or exp maneuvers   CV:  RRR  no s3 or murmur or increase in P2, no edema   ABD:  soft and nontender with nl excursion in the supine position. No bruits or organomegaly, bowel sounds nl  MS:  warm without deformities, calf tenderness, cyanosis or clubbing      12/07/11 CT 1. Waxing  and waning reticulonodular interstitial opacities in the  lungs over the last 5 years, with tree in bud morphology suggesting  atypical infectious bronchiolitis and airway plugging.  2. Bronchiectasis and progressive atelectasis in the right middle  lobe of the last 5 years, likely secondary to recurrent infection.  Given the overall volume loss, I am doubtful of malignancy in this  vicinity, and active pneumonia is not observed.  3. Mosaic attenuation with reduced conspicuity of vessels in the  more lucent areas of lungs, favoring either chronic pulmonary  embolus or obstructive bronchiolitis.  4. Coronary artery atherosclerosis         Assessment & Plan:

## 2012-01-24 NOTE — Assessment & Plan Note (Addendum)
-   PFT's 01/09/2008 Mild airflow obstruction no response to B2  No flare despite rhinitis flare,  rec pft's (again)

## 2012-01-24 NOTE — Assessment & Plan Note (Signed)
Flare with sinus pain suggesting sinusitis with multiple allergies so try doxy x 10 days first then consider Floroquinolone plus lots of nasal saline irrigation.

## 2012-02-04 ENCOUNTER — Ambulatory Visit (INDEPENDENT_AMBULATORY_CARE_PROVIDER_SITE_OTHER): Payer: Medicare Other | Admitting: *Deleted

## 2012-02-04 DIAGNOSIS — Z7901 Long term (current) use of anticoagulants: Secondary | ICD-10-CM | POA: Diagnosis not present

## 2012-02-04 DIAGNOSIS — I2699 Other pulmonary embolism without acute cor pulmonale: Secondary | ICD-10-CM | POA: Diagnosis not present

## 2012-02-04 LAB — POCT INR: INR: 2

## 2012-02-13 ENCOUNTER — Other Ambulatory Visit: Payer: Self-pay | Admitting: Internal Medicine

## 2012-02-25 ENCOUNTER — Ambulatory Visit (INDEPENDENT_AMBULATORY_CARE_PROVIDER_SITE_OTHER): Payer: Medicare Other | Admitting: *Deleted

## 2012-02-25 DIAGNOSIS — I2699 Other pulmonary embolism without acute cor pulmonale: Secondary | ICD-10-CM

## 2012-02-25 DIAGNOSIS — Z7901 Long term (current) use of anticoagulants: Secondary | ICD-10-CM

## 2012-02-25 LAB — POCT INR: INR: 3.5

## 2012-02-29 ENCOUNTER — Encounter: Payer: Self-pay | Admitting: Emergency Medicine

## 2012-02-29 ENCOUNTER — Ambulatory Visit (INDEPENDENT_AMBULATORY_CARE_PROVIDER_SITE_OTHER): Payer: Medicare Other | Admitting: Emergency Medicine

## 2012-02-29 VITALS — BP 152/70 | HR 83 | Temp 98.7°F | Ht 65.5 in | Wt 166.6 lb

## 2012-02-29 DIAGNOSIS — J309 Allergic rhinitis, unspecified: Secondary | ICD-10-CM | POA: Diagnosis not present

## 2012-02-29 DIAGNOSIS — J479 Bronchiectasis, uncomplicated: Secondary | ICD-10-CM | POA: Diagnosis not present

## 2012-02-29 DIAGNOSIS — J449 Chronic obstructive pulmonary disease, unspecified: Secondary | ICD-10-CM

## 2012-02-29 NOTE — Assessment & Plan Note (Signed)
Due to underlying bronchiectasis.  - prn SABA

## 2012-02-29 NOTE — Patient Instructions (Addendum)
Please continue your coumadin  Please continue your loratadine and nasal spray for your allergies Take albuterol 2 puffs up to every 4 hours if needed for shortness of breath.

## 2012-02-29 NOTE — Progress Notes (Signed)
Subjective:    Patient ID: [name], female    DOB: 27-May-1930  MRN: [id number]  HPI 56 yowf never smoker, allergic rhinitis, hx bilat PE in September 2008,with asociated Pulm HTN (PASP 70 with intact LV fxn). Started on Spiriva after PFT's showed mild to mod AFL without BD response; she stopped this due to dry eyes, L eye pain.  12/20/10 self referred to [facility] for eval of sob.  12/20/10  ov/ Wert  cc sob x 2 months gradual onset progressive to point where has trouble vacuuming or scrubbing the floors better when wears 02 as was told to do.  Does not wear 02 at hs. Says only has" large tank"  at home. "No better"  with abx though acutally was assoc with cough that was productive of green now white mostly in am but then still cough all day long.   Sleeping ok without nocturnal  Cough  or early am exacerbation  of respiratory  c/o's or need for noct saba. Also denies any obvious fluctuation of symptoms with weather or environmental changes or other aggravating or alleviating factors except as outlined above   01/24/2012 f/u ov/Wert cc Prod cough (Green ) for 3 weeks off and on - Sinus drainage - Head congestion - Soreness in L face - SOB on exertion but not really limiting desired activities  ROV 02/29/12 -- 81 yowf never smoker, allergic rhinitis, hx bilat PE in September 2008,with asociated Pulm HTN (PASP 70 with intact LV fxn). Trial 2009 of Spiriva after PFT's showed mild to mod AFL without BD response. Seen by Dr Sherene Sires for progressive SOB in 3/13 as above. CT scan with evidence chronic reticulonodular dz and bronchiectasis. Tells me today that her breathing is doing well. Her cough and PND/sinus sx improved significantly on the doxycycline >> improved. She is on nasonex and loratadine. Her biggest question today is whether she can come off coumadin. Given our suspicion that she could have had chronic PE + PAH, I would favor continuing the coumadin.      Objective:   Physical Exam Wt Readings  from Last 3 Encounters:  02/29/12 166 lb 9.6 oz (75.569 kg)  01/24/12 156 lb 12.8 oz (71.124 kg)  01/10/12 164 lb (74.39 kg)  HEENT: nl dentition, turbinates, and orophanx. Nl external ear canals without cough reflex  NECK :  without JVD/Nodes/TM/ nl carotid upstrokes bilaterally  LUNGS: no acc muscle use, clear to A and P bilaterally without cough on insp or exp maneuvers  CV:  RRR  no s3 or murmur or increase in P2, no edema  MS:  warm without deformities, calf tenderness, cyanosis or clubbing  SKIN: warm and dry without lesions    NEURO:  alert, approp, no deficits     CT scan chest 12/07/11:  1. Waxing and waning reticulonodular interstitial opacities in the  lungs over the last 5 years, with tree in bud morphology suggesting  atypical infectious bronchiolitis and airway plugging.  2. Bronchiectasis and progressive atelectasis in the right middle  lobe of the last 5 years, likely secondary to recurrent infection.  Given the overall volume loss, I am doubtful of malignancy in this  vicinity, and active pneumonia is not observed.  3. Mosaic attenuation with reduced conspicuity of vessels in the  more lucent areas of lungs, favoring either chronic pulmonary  embolus or obstructive bronchiolitis.  4. Coronary artery atherosclerosis       Assessment & Plan:  COPD Due to underlying bronchiectasis.  -  prn SABA  Bronchiectasis reviewed signs/sx of exacerbation with her today  ALLERGIC RHINITIS Continue same regimen

## 2012-02-29 NOTE — Assessment & Plan Note (Signed)
reviewed signs/sx of exacerbation with her today

## 2012-02-29 NOTE — Assessment & Plan Note (Signed)
Continue same regimen 

## 2012-03-19 ENCOUNTER — Ambulatory Visit (INDEPENDENT_AMBULATORY_CARE_PROVIDER_SITE_OTHER): Payer: Medicare Other | Admitting: *Deleted

## 2012-03-19 DIAGNOSIS — I2699 Other pulmonary embolism without acute cor pulmonale: Secondary | ICD-10-CM | POA: Diagnosis not present

## 2012-03-19 DIAGNOSIS — Z7901 Long term (current) use of anticoagulants: Secondary | ICD-10-CM | POA: Diagnosis not present

## 2012-03-21 ENCOUNTER — Encounter: Payer: Self-pay | Admitting: Internal Medicine

## 2012-03-21 ENCOUNTER — Ambulatory Visit (INDEPENDENT_AMBULATORY_CARE_PROVIDER_SITE_OTHER): Payer: Medicare Other | Admitting: Internal Medicine

## 2012-03-21 VITALS — BP 146/68 | HR 73 | Temp 97.5°F | Ht 65.0 in | Wt 164.5 lb

## 2012-03-21 DIAGNOSIS — I10 Essential (primary) hypertension: Secondary | ICD-10-CM

## 2012-03-21 DIAGNOSIS — R7309 Other abnormal glucose: Secondary | ICD-10-CM | POA: Diagnosis not present

## 2012-03-21 DIAGNOSIS — F329 Major depressive disorder, single episode, unspecified: Secondary | ICD-10-CM

## 2012-03-21 DIAGNOSIS — R7302 Impaired glucose tolerance (oral): Secondary | ICD-10-CM

## 2012-03-21 NOTE — Patient Instructions (Addendum)
Continue all other medications as before Please return in 6 months or sooner if needed

## 2012-03-21 NOTE — Progress Notes (Signed)
Subjective:    Patient ID: [name], female    DOB: 13-Jun-1930, 78 y.o.   MRN: [id number]  HPI  Here to f/u;  Pt denies chest pain, increased sob or doe, wheezing, orthopnea, PND, increased LE swelling, palpitations, dizziness or syncope.  Left neck and back pain now much improved, seeing chiropracter.  Pt denies new neurological symptoms such as new headache, or facial or extremity weakness or numbness   Pt denies polydipsia, polyuria.   Pt denies fever, wt loss, night sweats, loss of appetite, or other constitutional symptoms.  Denies worsening depressive symptoms, suicidal ideation, or panic, though has ongoing anxiety, not increased recently.   BP at home < 140/90.  No overt bleeding or bruising.   Past Medical History  Diagnosis Date  . VIRAL INFECTION 05/07/2008  . HYPERLIPIDEMIA 08/05/2007  . ANXIETY 08/05/2007  . DEPRESSION 08/05/2007  . COMMON MIGRAINE 08/05/2007  . Unspecified hearing loss 10/17/2007  . HYPERTENSION 03/11/2007  . CORONARY ARTERY DISEASE 04/06/2009  . PULMONARY HYPERTENSION, SECONDARY 01/15/2008  . CAROTID ARTERY STENOSIS, RIGHT 08/05/2007  . SINUSITIS- ACUTE-NOS 10/17/2007  . URI 07/15/2008  . Chronic rhinitis 11/22/2008  . ALLERGIC RHINITIS 08/05/2007  . COPD 03/31/2008  . GERD 03/11/2007  . DIVERTICULOSIS, COLON 08/05/2007  . CYST/PSEUDOCYST, PANCREAS 08/05/2007  . HEMATOCHEZIA 05/07/2008  . OVERACTIVE BLADDER 05/26/2009  . CELLULITIS, LEG, LEFT 03/25/2009  . SKIN LESION 04/06/2009  . OSTEOARTHRITIS, KNEE, RIGHT 08/05/2007  . OSTEOPOROSIS 03/11/2007  . DIZZINESS, CHRONIC 10/14/2009  . GAIT IMBALANCE 11/26/2007  . FATIGUE 07/15/2008  . WEIGHT LOSS-ABNORMAL 06/06/2010  . Headache 11/26/2007  . DYSPNEA 12/30/2007  . NAUSEA 06/06/2010  . FLATULENCE-GAS-BLOATING 07/03/2010  . Dysuria 05/27/2008  . FREQUENCY, URINARY 03/25/2009  . ABDOMINAL PAIN-PERIUMBILICAL 06/06/2010  . ABDOMINAL PAIN-EPIGASTRIC 06/06/2010  . ABNORMAL EXAM-BILIARY TRACT 06/06/2010  . ABNORMAL FINDINGS GI  TRACT 07/03/2010  . TRANSIENT ISCHEMIC ATTACK, HX OF 03/11/2007  . PULMONARY EMBOLISM, HX OF 08/05/2007  . COLONIC POLYPS, HX OF 04/06/2009  . Personal History of Other Diseases of Digestive Disease 08/05/2007  . Pulmonary embolism 12/08/2010  . Bronchiectasis 12/11/2011   Past Surgical History  Procedure Date  . Abdominal hysterectomy   . Egd 1999  . Cholecystectomy   . Tonsillectomy   . Appendectomy   . S/p sinus surgury     reports that she has never smoked. She has never used smokeless tobacco. She reports that she does not drink alcohol or use illicit drugs. family history includes Cancer in her daughter; Emphysema in her father; Lymphoma in her sister; and Stroke in her father. Allergies  Allergen Reactions  . Amoxicillin-Pot Clavulanate     REACTION: diarrhea  . Ciclesonide     REACTION: bad smell  . Raloxifene     REACTION: risk of recurrent stroke  . Sulfonamide Derivatives     REACTION: tongue swells   Review of Systems Review of Systems  Constitutional: Negative for diaphoresis and unexpected weight change.  HENT: Negative for drooling and tinnitus.   Eyes: Negative for photophobia and visual disturbance.  Respiratory: Negative for choking and stridor.   Gastrointestinal: Negative for vomiting and blood in stool.  Genitourinary: Negative for hematuria and decreased urine volume.  Musculoskeletal: Negative for gait problem.  Skin: Negative for color change and wound.  Neurological: Negative for tremors and numbness.     Objective:   Physical Exam BP 146/68  Pulse 73  Temp 97.5 F (36.4 C) (Oral)  Ht 5\' 5"  (1.651 m)  Wt 164  lb 8 oz (74.617 kg)  BMI 27.37 kg/m2  SpO2 95% Physical Exam  VS noted, not ill appearing Constitutional: Pt appears well-developed and well-nourished.  HENT: Head: Normocephalic.  Right Ear: External ear normal.  Left Ear: External ear normal.  Eyes: Conjunctivae and EOM are normal. Pupils are equal, round, and reactive to light.  Neck:  Normal range of motion. Neck supple.  Cardiovascular: Normal rate and regular rhythm.   Pulmonary/Chest: Effort normal and breath sounds mild decreased , no rales or wheezing.  Neurological: Pt is alert. Not confused,  Motor/gait intact Skin: Skin is warm. No erythema. No rash Psychiatric: Pt behavior is normal. Thought content normal. Minor nervous, not depressed affect    Assessment & Plan:

## 2012-03-22 ENCOUNTER — Encounter: Payer: Self-pay | Admitting: Internal Medicine

## 2012-03-22 NOTE — Assessment & Plan Note (Signed)
stable overall by hx and exam, most recent data reviewed with pt, and pt to continue medical treatment as before Lab Results  Component Value Date   WBC 10.5 11/29/2011   HGB 14.1 11/29/2011   HCT 43.3 11/29/2011   PLT 244.0 11/29/2011   GLUCOSE 73 11/29/2011   CHOL 205* 03/01/2011   TRIG 162.0* 03/01/2011   HDL 67.30 03/01/2011   LDLDIRECT 139.2 03/01/2011   ALT 26 11/29/2011   AST 23 11/29/2011   NA 140 11/29/2011   K 4.4 11/29/2011   CL 104 11/29/2011   CREATININE 0.6 11/29/2011   BUN 16 11/29/2011   CO2 28 11/29/2011   TSH 1.50 11/29/2011   INR 2.5 03/19/2012   HGBA1C 5.7 09/21/2011

## 2012-03-22 NOTE — Assessment & Plan Note (Signed)
stable overall by hx and exam, most recent data reviewed with pt, and pt to continue medical treatment as before  Lab Results  Component Value Date   HGBA1C 5.7 09/21/2011    

## 2012-03-22 NOTE — Assessment & Plan Note (Signed)
stable overall by hx and exam, most recent data reviewed with pt, and pt to continue medical treatment as before BP Readings from Last 3 Encounters:  03/21/12 146/68  02/29/12 152/70  01/24/12 152/68

## 2012-03-24 ENCOUNTER — Other Ambulatory Visit: Payer: Self-pay | Admitting: Internal Medicine

## 2012-04-23 ENCOUNTER — Ambulatory Visit (INDEPENDENT_AMBULATORY_CARE_PROVIDER_SITE_OTHER): Payer: Medicare Other | Admitting: *Deleted

## 2012-04-23 DIAGNOSIS — I2699 Other pulmonary embolism without acute cor pulmonale: Secondary | ICD-10-CM

## 2012-04-23 DIAGNOSIS — Z7901 Long term (current) use of anticoagulants: Secondary | ICD-10-CM | POA: Diagnosis not present

## 2012-04-23 LAB — POCT INR: INR: 2.7

## 2012-04-24 ENCOUNTER — Telehealth: Payer: Self-pay | Admitting: Pulmonary Disease

## 2012-04-24 ENCOUNTER — Ambulatory Visit (INDEPENDENT_AMBULATORY_CARE_PROVIDER_SITE_OTHER): Payer: Medicare Other | Admitting: Pulmonary Disease

## 2012-04-24 ENCOUNTER — Encounter: Payer: Self-pay | Admitting: Pulmonary Disease

## 2012-04-24 VITALS — BP 132/70 | HR 85 | Temp 98.0°F | Ht 65.0 in | Wt 165.8 lb

## 2012-04-24 DIAGNOSIS — J019 Acute sinusitis, unspecified: Secondary | ICD-10-CM | POA: Insufficient documentation

## 2012-04-24 MED ORDER — CIPROFLOXACIN HCL 500 MG PO TABS: ORAL_TABLET | ORAL | Status: DC

## 2012-04-24 NOTE — Telephone Encounter (Signed)
Called and spoke with pharmacist at [facility], Fayrene Fearing. He states that there in interaction with cipro and warfarin where there is potential for increased bleeding if the pt is not actively being monitored on therapy. I advised that she is followed [facility] and just had labs done yesterday. He states based upon this he will go ahead and dispense med. Nothing further needed.

## 2012-04-24 NOTE — Assessment & Plan Note (Signed)
The patient's history today is not overly suggestive of a flare up of her bronchiectasis.  She has no wheezing on exam today, and really has not seen a significant change in her exertional tolerance.

## 2012-04-24 NOTE — Patient Instructions (Addendum)
Will treat for sinusitis with ciprofloxacin 500mg  one in am and pm for next one week. Use nasal saline rinses to keep nose moist and clear. Please call if you feel things are not improving.

## 2012-04-24 NOTE — Assessment & Plan Note (Signed)
The patient's history is more consistent with an acute sinusitis than a flareup of her bronchiectasis.  Will treat with a short course of antibiotics, as well as more aggressive nasal hygiene.  If she does not see improvement, she is to call.

## 2012-04-24 NOTE — Progress Notes (Signed)
  Subjective:    Patient ID: [name], female    DOB: 01-19-1930, 78 y.o.   MRN: [id number]  HPI The patient comes in today for an acute sick visit.  She is normally followed by Dr. Delton Coombes for bronchiectasis with underlying airflow obstruction.  She gives a few week history of increasing sinus congestion and pressure, as well as headaches and dizziness.  She has had increased cough productive of purulent appearing mucus, but feels a lot of this is coming from sinus drainage.  She is not overly congested in her chest, and has not seen a big change in her exertional tolerance.   Review of Systems  Constitutional: Negative for fever and unexpected weight change.  HENT: Positive for congestion, sore throat and sinus pressure. Negative for ear pain, nosebleeds, rhinorrhea, sneezing, trouble swallowing, dental problem and postnasal drip.   Eyes: Negative for redness and itching.  Respiratory: Positive for cough. Negative for chest tightness, shortness of breath and wheezing.   Cardiovascular: Negative for palpitations and leg swelling.  Gastrointestinal: Negative for nausea and vomiting.  Genitourinary: Negative for dysuria.  Musculoskeletal: Negative for joint swelling.  Skin: Negative for rash.  Neurological: Positive for dizziness and headaches.  Hematological: Does not bruise/bleed easily.  Psychiatric/Behavioral: Negative for dysphoric mood. The patient is not nervous/anxious.   All other systems reviewed and are negative.       Objective:   Physical Exam Well-developed female in no acute distress Nose with significant purulence or discharge Oropharynx clear without exudates Chest with a few squeaks and crackles, but no active wheezing Cardiac exam with regular rate and rhythm Lower extremities with minimal ankle edema, no cyanosis Alert and oriented, moves all 4 extremities.       Assessment & Plan:

## 2012-04-24 NOTE — Addendum Note (Signed)
Addended by: Fenton Foy on: 04/24/2012 12:54 PM   Modules accepted: Orders

## 2012-05-01 ENCOUNTER — Telehealth: Payer: Self-pay | Admitting: Internal Medicine

## 2012-05-01 MED ORDER — CEFDINIR 300 MG PO CAPS: 300.0000 mg | ORAL_CAPSULE | Freq: Two times a day (BID) | ORAL | Status: AC

## 2012-05-01 NOTE — Telephone Encounter (Signed)
i called her in omnicef - it is a distant relative of augmentin which the record states causes diarrhea but we don't see it as much with omicef so should be good to go.  Stop cipro and let us know tomorrow p lunch if not better

## 2012-05-01 NOTE — Telephone Encounter (Signed)
LMOMTCB x1 for pt 

## 2012-05-01 NOTE — Telephone Encounter (Signed)
Pt returned triage's call.  Holly D Pryor ° °

## 2012-05-01 NOTE — Telephone Encounter (Signed)
LMTCB

## 2012-05-01 NOTE — Telephone Encounter (Signed)
Pt saw [facility] as acute on 04/24/12. Pt states she is having severe dizziness on the Cipro which she began taking on 04/25/12. Her symptoms have not improved, still has yellow nasal congestion and says her head feels full. Slight headache. She denies any fever or sore throat or ear pain. MW pls advise. Allergies  Allergen Reactions  . Amoxicillin-Pot Clavulanate     REACTION: diarrhea  . Ciclesonide     REACTION: bad smell  . Raloxifene     REACTION: risk of recurrent stroke  . Sulfonamide Derivatives     REACTION: tongue swells

## 2012-05-02 NOTE — Telephone Encounter (Signed)
LMTCBx2 on home #. [facility] cell number but was informed it was the wrong number.Carron Curie, CMA

## 2012-05-02 NOTE — Telephone Encounter (Signed)
Pt is aware and has already had two doses of Omnicef. She says the dizziness is gone and Cipro has been added to her allergy list.

## 2012-05-22 ENCOUNTER — Ambulatory Visit (INDEPENDENT_AMBULATORY_CARE_PROVIDER_SITE_OTHER): Payer: Medicare Other | Admitting: *Deleted

## 2012-05-22 DIAGNOSIS — I2699 Other pulmonary embolism without acute cor pulmonale: Secondary | ICD-10-CM | POA: Diagnosis not present

## 2012-05-22 DIAGNOSIS — Z7901 Long term (current) use of anticoagulants: Secondary | ICD-10-CM | POA: Diagnosis not present

## 2012-05-28 ENCOUNTER — Other Ambulatory Visit: Payer: Self-pay | Admitting: Internal Medicine

## 2012-05-28 ENCOUNTER — Encounter: Payer: Self-pay | Admitting: Internal Medicine

## 2012-05-28 ENCOUNTER — Ambulatory Visit (INDEPENDENT_AMBULATORY_CARE_PROVIDER_SITE_OTHER): Payer: Medicare Other | Admitting: Internal Medicine

## 2012-05-28 VITALS — BP 150/78 | HR 84 | Temp 97.1°F | Ht 64.0 in | Wt 159.2 lb

## 2012-05-28 DIAGNOSIS — R42 Dizziness and giddiness: Secondary | ICD-10-CM

## 2012-05-28 DIAGNOSIS — I10 Essential (primary) hypertension: Secondary | ICD-10-CM | POA: Diagnosis not present

## 2012-05-28 DIAGNOSIS — J449 Chronic obstructive pulmonary disease, unspecified: Secondary | ICD-10-CM

## 2012-05-28 NOTE — Assessment & Plan Note (Signed)
stable overall by hx and exam, most recent data reviewed with pt, and pt to continue medical treatment as before SpO2 Readings from Last 3 Encounters:  05/28/12 95%  04/24/12 90%  03/21/12 95%

## 2012-05-28 NOTE — Assessment & Plan Note (Signed)
Mild elev today likely reactionary, stable overall by hx and exam, most recent data reviewed with pt, and pt to continue medical treatment as before BP Readings from Last 3 Encounters:  05/28/12 150/78  04/24/12 132/70  03/21/12 146/68

## 2012-05-28 NOTE — Patient Instructions (Addendum)
Your right ear was irrigated today of wax Continue all other medications as before, including the meclizine I don't think more testing is needed at this time Please call if not improved in the next week or so, to consider neurology referral Please keep your appointments with your specialists as you have do - Dr Shelle Iron Please return in 6 months, or sooner if needed

## 2012-05-28 NOTE — Assessment & Plan Note (Signed)
Right ear irrigated, hopefully this will help, exam o/w benign,  to f/u any worsening symptoms or concerns

## 2012-05-28 NOTE — Progress Notes (Signed)
Subjective:    Patient ID: [name], female    DOB: Feb 24, 1930, 78 y.o.   MRN: [id number]  HPI  Here with episodes of vertigo several times over the past wk, worse a few times with lying down/changing position/getting up in the AM.   No fever, HA, blurred vision, ST, sinus symptoms, cough, and Pt denies chest pain, increased sob or doe, wheezing, orthopnea, PND, increased LE swelling, palpitations, dizziness or syncope.   Pt denies polydipsia, polyuria.  .Pt denies new neurological symptoms such as new headache, or facial or extremity weakness or numbness.  Has some hearing loss on the right as well.   Past Medical History  Diagnosis Date  . VIRAL INFECTION 05/07/2008  . HYPERLIPIDEMIA 08/05/2007  . ANXIETY 08/05/2007  . DEPRESSION 08/05/2007  . COMMON MIGRAINE 08/05/2007  . Unspecified hearing loss 10/17/2007  . HYPERTENSION 03/11/2007  . CORONARY ARTERY DISEASE 04/06/2009  . PULMONARY HYPERTENSION, SECONDARY 01/15/2008  . CAROTID ARTERY STENOSIS, RIGHT 08/05/2007  . SINUSITIS- ACUTE-NOS 10/17/2007  . URI 07/15/2008  . Chronic rhinitis 11/22/2008  . ALLERGIC RHINITIS 08/05/2007  . COPD 03/31/2008  . GERD 03/11/2007  . DIVERTICULOSIS, COLON 08/05/2007  . CYST/PSEUDOCYST, PANCREAS 08/05/2007  . HEMATOCHEZIA 05/07/2008  . OVERACTIVE BLADDER 05/26/2009  . CELLULITIS, LEG, LEFT 03/25/2009  . SKIN LESION 04/06/2009  . OSTEOARTHRITIS, KNEE, RIGHT 08/05/2007  . OSTEOPOROSIS 03/11/2007  . DIZZINESS, CHRONIC 10/14/2009  . GAIT IMBALANCE 11/26/2007  . FATIGUE 07/15/2008  . WEIGHT LOSS-ABNORMAL 06/06/2010  . Headache 11/26/2007  . DYSPNEA 12/30/2007  . NAUSEA 06/06/2010  . FLATULENCE-GAS-BLOATING 07/03/2010  . Dysuria 05/27/2008  . FREQUENCY, URINARY 03/25/2009  . ABDOMINAL PAIN-PERIUMBILICAL 06/06/2010  . ABDOMINAL PAIN-EPIGASTRIC 06/06/2010  . ABNORMAL EXAM-BILIARY TRACT 06/06/2010  . ABNORMAL FINDINGS GI TRACT 07/03/2010  . TRANSIENT ISCHEMIC ATTACK, HX OF 03/11/2007  . PULMONARY EMBOLISM, HX OF 08/05/2007    . COLONIC POLYPS, HX OF 04/06/2009  . Personal History of Other Diseases of Digestive Disease 08/05/2007  . Pulmonary embolism 12/08/2010  . Bronchiectasis 12/11/2011   Past Surgical History  Procedure Date  . Abdominal hysterectomy   . Egd 1999  . Cholecystectomy   . Tonsillectomy   . Appendectomy   . S/p sinus surgury     reports that she has never smoked. She has never used smokeless tobacco. She reports that she does not drink alcohol or use illicit drugs. family history includes Cancer in her daughter; Emphysema in her father; Lymphoma in her sister; and Stroke in her father. Allergies  Allergen Reactions  . Amoxicillin-Pot Clavulanate     REACTION: diarrhea  . Ciclesonide     REACTION: bad smell  . Ciprofloxacin Other (See Comments)    dizziness  . Raloxifene     REACTION: risk of recurrent stroke  . Sulfonamide Derivatives     REACTION: tongue swells   Current Outpatient Prescriptions on File Prior to Visit  Medication Sig Dispense Refill  . amLODipine (NORVASC) 5 MG tablet TAKE 1 TABLET BY MOUTH EVERY DAY  90 tablet  3  . aspirin 81 MG EC tablet Take 81 mg by mouth daily.        . calcium carbonate (TUMS) 500 MG chewable tablet Chew 1 tablet by mouth daily as needed.        . ciprofloxacin (CIPRO) 500 MG tablet Take 1 tablet (500mg ) two times daily for 7 days  14 tablet  0  . Digestive Enzymes (PAPAYA AND ENZYMES) CHEW Chew by mouth. Chew 2 tablets with meals  as needed       . loratadine (CLARITIN) 10 MG tablet Take 10 mg by mouth daily.        [name] losartan (COZAAR) 100 MG tablet Take 1 tablet by mouth Daily.      . meclizine (ANTIVERT) 12.5 MG tablet       . mometasone (NASONEX) 50 MCG/ACT nasal spray Place 2 sprays into the nose daily as needed.       . Multiple Vitamins-Minerals (MACUVITE) TABS Take 1 tablet by mouth daily.        . pantoprazole (PROTONIX) 40 MG tablet Take 30-60 min before first meal of the day      . warfarin (COUMADIN) 5 MG tablet TAKE 1 TABLET  EVERY DAY  30 tablet  11   Review of Systems All otherwise neg per pt     Objective:   Physical Exam BP 150/78  Pulse 84  Temp 97.1 F (36.2 C) (Oral)  Ht 5\' 4"  (1.626 m)  Wt 159 lb 4 oz (72.235 kg)  BMI 27.34 kg/m2  SpO2 95% Physical Exam  VS noted, not ill appearing Constitutional: Pt appears well-developed and well-nourished.  HENT: Head: Normocephalic.  Right Ear: External ear normal.  Left Ear: External ear normal.  Right canal impacted, cleared with irrigation Eyes: Conjunctivae and EOM are normal. Pupils are equal, round, and reactive to light.  Neck: Normal range of motion. Neck supple.  Cardiovascular: Normal rate and regular rhythm.   Pulmonary/Chest: Effort normal and breath sounds decreased bilat, rare wheeze Neurological: Pt is alert. No cranial nerve deficit. motor/gait intact/FTN intact Skin: Skin is warm. No erythema. No rash    Assessment & Plan:

## 2012-05-30 ENCOUNTER — Telehealth: Payer: Self-pay | Admitting: Internal Medicine

## 2012-05-30 MED ORDER — DIAZEPAM 5 MG PO TABS: 5.0000 mg | ORAL_TABLET | Freq: Three times a day (TID) | ORAL | Status: AC | PRN

## 2012-05-30 NOTE — Telephone Encounter (Signed)
The patient called to inform she forgot to mention she had fallen 3 weeks ago. She thought maybe her symptoms of dizziness and nausea were due to sinus problems, but apparently not as she is still having dizziness and nausea.  Please advise OV

## 2012-05-30 NOTE — Telephone Encounter (Signed)
Caller: Taiz/Patient; Phone: [phone]; Reason for Call: Pt requests to speak w/Robin; she was seen recently but forgot to mention that she had fallen about a week previously.  She would like to discuss this with Dr Raphael Gibney nurse Zella Ball.

## 2012-05-30 NOTE — Telephone Encounter (Signed)
I am not sure where to proceed, ok for valium low dose for several days, as this has been helpful to decrease vertigo when severe for other patients as well  If persists or worsen, I can refer to Neurology, or CT (or MRI) to make sure no stroke if she would like

## 2012-05-30 NOTE — Telephone Encounter (Signed)
Called the patient informed of MD instructions and faxed hardcopy to pharmacy.

## 2012-06-16 DIAGNOSIS — H6120 Impacted cerumen, unspecified ear: Secondary | ICD-10-CM | POA: Diagnosis not present

## 2012-06-16 DIAGNOSIS — R42 Dizziness and giddiness: Secondary | ICD-10-CM | POA: Diagnosis not present

## 2012-06-16 DIAGNOSIS — H8110 Benign paroxysmal vertigo, unspecified ear: Secondary | ICD-10-CM | POA: Diagnosis not present

## 2012-07-03 ENCOUNTER — Ambulatory Visit (INDEPENDENT_AMBULATORY_CARE_PROVIDER_SITE_OTHER): Payer: Medicare Other | Admitting: *Deleted

## 2012-07-03 DIAGNOSIS — Z7901 Long term (current) use of anticoagulants: Secondary | ICD-10-CM

## 2012-07-03 DIAGNOSIS — I2699 Other pulmonary embolism without acute cor pulmonale: Secondary | ICD-10-CM | POA: Diagnosis not present

## 2012-07-08 ENCOUNTER — Encounter: Payer: Self-pay | Admitting: Adult Health

## 2012-07-08 ENCOUNTER — Ambulatory Visit (INDEPENDENT_AMBULATORY_CARE_PROVIDER_SITE_OTHER): Payer: Medicare Other | Admitting: Adult Health

## 2012-07-08 VITALS — BP 146/66 | HR 78 | Temp 97.1°F | Ht 65.0 in | Wt 161.6 lb

## 2012-07-08 DIAGNOSIS — J479 Bronchiectasis, uncomplicated: Secondary | ICD-10-CM

## 2012-07-08 MED ORDER — LEVOFLOXACIN 500 MG PO TABS: 500.0000 mg | ORAL_TABLET | Freq: Every day | ORAL | Status: DC

## 2012-07-08 NOTE — Assessment & Plan Note (Signed)
Mild flare   Plan Levaquin 500mg  daily for 7 days  Mucinex DM Twice daily  As needed  Cough /congestion  Fluids and rest  Please contact office for sooner follow up if symptoms do not improve or worsen or seek emergency care  Follow up Dr. Sherene Sires  In 6 weeks.  Notify coumadin clinic you have started on antibitoic

## 2012-07-08 NOTE — Progress Notes (Signed)
Subjective:    Patient ID: [name], female    DOB: 11-04-1929   MRN: [id number]  HPI  64 yowf never smoker, allergic rhinitis, hx bilat PE in September 2008,with asociated Pulm HTN (PASP 70 with intact LV fxn). Started on Spiriva after PFT's showed mild to mod AFL without BD response; she stopped this due to dry eyes, L eye pain.  12/20/11 self referred to [facility] for eval of sob.  12/20/11  ov/ Wert  cc sob x 2 months gradual onset progressive to point where has trouble vacuuming or scrubbing the floors better when wears 02 as was told to do.  Does not wear 02 at hs. Says only has" large tank"  at home. "No better"  with abx though acutally was assoc with cough that was productive of green now white mostly in am but then still cough all day long.  rec Bronchiectasis =   you have scarring of your bronchial tubes which means that they don't function perfectly normally and mucus tends to pool in certain areas of your lung which can cause pneumonia and further scarring of your lung and bronchial tubes Whenever you develop cough congestion take mucinex or mucinex dm > these will help keep the mucus loose and flowing but if your condition worsens you need to seek help immediately preferably here or somewhere inside the [facility] system to compare xrays ( worse = darker or bloody mucus or pain on breathing in)  Protonix (pantoprozole) 40 mg Take 30-60 min before first meal of the day  GERD (REFLUX)   01/24/2012 f/u ov/Wert cc Prod cough (Green ) for 3 weeks off and on - Sinus drainage - Head congestion - Soreness in L face - SOB on exertion but not really limiting desired activities.   07/08/2012 Acute OV  Complains of head congestion w/ PND, prod cough with green mucus, occ wheezing, occ chills x2-3weeks. Cough is getting worse.  Mucinex is not helping.  No hemoptysis or chest pain. No fever            ROS  Constitutional:   No  weight loss, night sweats,  Fevers, chills,  +fatigue, or   lassitude.  HEENT:   No headaches,  Difficulty swallowing,  Tooth/dental problems, or  Sore throat,                No sneezing, itching, ear ache, + nasal congestion, post nasal drip,   CV:  No chest pain,  Orthopnea, PND, swelling in lower extremities, anasarca, dizziness, palpitations, syncope.   GI  No heartburn, indigestion, abdominal pain, nausea, vomiting, diarrhea, change in bowel habits, loss of appetite, bloody stools.   Resp:  No coughing up of blood.    No chest wall deformity  Skin: no rash or lesions.  GU: No flank pain, no hematuria   MS:  No joint pain or swelling.  No decreased range of motion.  No back pain.  Psych:  No change in mood or affect. No depression or anxiety.  No memory loss.          Objective:   Physical Exam  amb wf  Wt 01/24/2012  156>161 07/08/2012    HEENT: nl dentition, turbinates, and orophanx. Nl external ear canals without cough reflex   NECK :  without JVD/Nodes/TM/ nl carotid upstrokes bilaterally   LUNGS: no acc muscle use, coarse BS    CV:  RRR  no s3 or murmur or increase in P2, no edema   ABD:  soft and nontender with nl excursion in the supine position. No bruits or organomegaly, bowel sounds nl  MS:  warm without deformities, calf tenderness, cyanosis or clubbing      12/07/11 CT 1. Waxing and waning reticulonodular interstitial opacities in the  lungs over the last 5 years, with tree in bud morphology suggesting  atypical infectious bronchiolitis and airway plugging.  2. Bronchiectasis and progressive atelectasis in the right middle  lobe of the last 5 years, likely secondary to recurrent infection.  Given the overall volume loss, I am doubtful of malignancy in this  vicinity, and active pneumonia is not observed.  3. Mosaic attenuation with reduced conspicuity of vessels in the  more lucent areas of lungs, favoring either chronic pulmonary  embolus or obstructive bronchiolitis.  4. Coronary artery  atherosclerosis       Assessment & Plan:

## 2012-07-08 NOTE — Patient Instructions (Addendum)
Levaquin 500mg  daily for 7 days  Mucinex DM Twice daily  As needed  Cough /congestion  Fluids and rest  Please contact office for sooner follow up if symptoms do not improve or worsen or seek emergency care  Follow up Dr. Sherene Sires  In 6 weeks.  Notify coumadin clinic you have started on antibitoic

## 2012-07-16 DIAGNOSIS — Z23 Encounter for immunization: Secondary | ICD-10-CM | POA: Diagnosis not present

## 2012-08-05 ENCOUNTER — Telehealth: Payer: Self-pay | Admitting: Emergency Medicine

## 2012-08-05 NOTE — Telephone Encounter (Signed)
I spoke with [name] and she stated pt is in Medicare audit and there is 7 stages to this. She stated pt has nothing to worry about and if at the end of audit and medicare denies her claims she will just need to be re qualified. She stated she has already called pt and explained everything to pt and she was understanding everything [name] had advised her of. Nothing further was needed

## 2012-08-05 NOTE — Telephone Encounter (Signed)
Crystal from [employer] returning call.Raylene Everts

## 2012-08-05 NOTE — Telephone Encounter (Signed)
Called, spoke with pt who states she received letter from [facility] stating that they are not covering her o2.  States the letter states the claim did not show this was prescribing by your dr.  Algis Downs will call [employer] and call her back.  Note:  Pt states she has spoken with Billing Dept at [facility] but wasn't any help.  I called [employer], spoke with Melissa who states she will call [employer]'s billing dept to check on this and will call back.  Will await call.

## 2012-08-14 ENCOUNTER — Ambulatory Visit (INDEPENDENT_AMBULATORY_CARE_PROVIDER_SITE_OTHER): Payer: Medicare Other | Admitting: General Practice

## 2012-08-14 DIAGNOSIS — Z7901 Long term (current) use of anticoagulants: Secondary | ICD-10-CM | POA: Diagnosis not present

## 2012-08-14 DIAGNOSIS — I2699 Other pulmonary embolism without acute cor pulmonale: Secondary | ICD-10-CM | POA: Diagnosis not present

## 2012-08-28 ENCOUNTER — Other Ambulatory Visit: Payer: Self-pay | Admitting: Internal Medicine

## 2012-09-23 ENCOUNTER — Ambulatory Visit (INDEPENDENT_AMBULATORY_CARE_PROVIDER_SITE_OTHER): Payer: Medicare Other | Admitting: Internal Medicine

## 2012-09-23 ENCOUNTER — Ambulatory Visit (INDEPENDENT_AMBULATORY_CARE_PROVIDER_SITE_OTHER)
Admission: RE | Admit: 2012-09-23 | Discharge: 2012-09-23 | Disposition: A | Discharge status: Home / Self Care | Payer: PRIVATE HEALTH INSURANCE | Source: Ambulatory Visit | Attending: Internal Medicine | Admitting: Internal Medicine

## 2012-09-23 ENCOUNTER — Ambulatory Visit (INDEPENDENT_AMBULATORY_CARE_PROVIDER_SITE_OTHER): Payer: Medicare Other | Admitting: General Practice

## 2012-09-23 ENCOUNTER — Encounter: Payer: Self-pay | Admitting: Internal Medicine

## 2012-09-23 VITALS — BP 168/72 | HR 88 | Temp 98.5°F | Ht 64.0 in | Wt 159.8 lb

## 2012-09-23 DIAGNOSIS — J471 Bronchiectasis with (acute) exacerbation: Secondary | ICD-10-CM

## 2012-09-23 DIAGNOSIS — Z7901 Long term (current) use of anticoagulants: Secondary | ICD-10-CM

## 2012-09-23 DIAGNOSIS — J42 Unspecified chronic bronchitis: Secondary | ICD-10-CM | POA: Diagnosis not present

## 2012-09-23 DIAGNOSIS — J449 Chronic obstructive pulmonary disease, unspecified: Secondary | ICD-10-CM | POA: Diagnosis not present

## 2012-09-23 DIAGNOSIS — I2699 Other pulmonary embolism without acute cor pulmonale: Secondary | ICD-10-CM | POA: Diagnosis not present

## 2012-09-23 LAB — POCT INR: INR: 2.3

## 2012-09-23 MED ORDER — ALBUTEROL SULFATE HFA 108 (90 BASE) MCG/ACT IN AERS: 2.0000 | INHALATION_SPRAY | Freq: Four times a day (QID) | RESPIRATORY_TRACT | Status: DC | PRN

## 2012-09-23 MED ORDER — LEVOFLOXACIN 500 MG PO TABS: 500.0000 mg | ORAL_TABLET | Freq: Every day | ORAL | Status: DC

## 2012-09-23 NOTE — Progress Notes (Signed)
  Subjective:    HPI  complains of cough and cold symptoms  Onset >1 week ago, wax/wane symptoms, but worse at this time  Initially associated with rhinorrhea, sneezing, sore throat, mild headache and low grade fever Now productive cough with mild-mod chest congestion, symptoms worse at night mod relief with OTC meds, but short acting Precipitated by sick contacts  Past Medical History  Diagnosis Date  . HYPERLIPIDEMIA   . ANXIETY   . DEPRESSION   . COMMON MIGRAINE   . Unspecified hearing loss   . HYPERTENSION   . CORONARY ARTERY DISEASE   . PULMONARY HYPERTENSION, SECONDARY   . CAROTID ARTERY STENOSIS, RIGHT   . Chronic rhinitis   . ALLERGIC RHINITIS   . COPD   . GERD   . DIVERTICULOSIS, COLON   . CYST/PSEUDOCYST, PANCREAS   . OVERACTIVE BLADDER   . OSTEOPOROSIS   . DIZZINESS, CHRONIC   . Pulmonary embolism 2008  . Bronchiectasis 12/11/2011    CT chest dx    Review of Systems Constitutional: No unexpected weight change Pulmonary: No pleurisy or hemoptysis Cardiovascular: No chest pain or palpitations     Objective:   Physical Exam BP 168/72  Pulse 88  Temp 98.5 F (36.9 C) (Oral)  Ht 5\' 4"  (1.626 m)  Wt 159 lb 12.8 oz (72.485 kg)  BMI 27.43 kg/m2  SpO2 93% GEN: mildly ill appearing and audible chest congestion HENT: NCAT, mild sinus tenderness bilaterally, nares with clear discharge, oropharynx mild erythema, no exudate Eyes: Vision grossly intact, no conjunctivitis Lungs: scattered bilateral rhonchi at bases, no wheeze, no increased work of breathing Cardiovascular: Regular rate and rhythm, no bilateral edema  Lab Results  Component Value Date   WBC 10.5 11/29/2011   HGB 14.1 11/29/2011   HCT 43.3 11/29/2011   PLT 244.0 11/29/2011   GLUCOSE 73 11/29/2011   CHOL 205* 03/01/2011   TRIG 162.0* 03/01/2011   HDL 67.30 03/01/2011   LDLDIRECT 139.2 03/01/2011   ALT 26 11/29/2011   AST 23 11/29/2011   NA 140 11/29/2011   K 4.4 11/29/2011   CL 104 11/29/2011   CREATININE 0.6 11/29/2011   BUN 16 11/29/2011   CO2 28 11/29/2011   TSH 1.50 11/29/2011   INR 2.3 09/23/2012   HGBA1C 5.7 09/21/2011       Assessment & Plan:  Viral URI >progression to acute bronchitis with bronchiectasis exacerbation   Check CXR today: no new infiltrate or acute change Empiric antibiotics prescribed due to symptom duration greater than 7 days and comorbid disease OTC cough suppression, to call if needed to consider steroid or rx cough med (both declined today) -  Symptomatic care with Tylenol or Advil, hydration and rest -

## 2012-09-23 NOTE — Patient Instructions (Signed)
It was good to see you today. Test(s) ordered today. Your results will be released to MyChart (or called to you) after review, usually within 72hours after test completion. If any changes need to be made, you will be notified at that same time. Levaquin antibiotics daily x 10days Your prescription(s) have been submitted to your pharmacy. Please take as directed and contact our office if you believe you are having problem(s) with the medication(s). Hydrate, rest and call if worse or unimproved

## 2012-09-25 ENCOUNTER — Telehealth: Payer: Self-pay | Admitting: *Deleted

## 2012-09-25 ENCOUNTER — Other Ambulatory Visit: Payer: Self-pay | Admitting: Internal Medicine

## 2012-09-25 MED ORDER — HYDROCODONE-HOMATROPINE 5-1.5 MG/5ML PO SYRP: 5.0000 mL | ORAL_SOLUTION | Freq: Three times a day (TID) | ORAL | Status: DC | PRN

## 2012-09-25 NOTE — Telephone Encounter (Signed)
Notified pt cough syrup sent to pharmacy....Raechel Chute

## 2012-09-25 NOTE — Telephone Encounter (Signed)
Infor pt of cxr results. Pt states she is still having problems with cough at night & not being able to sleep. Requesting md to rx cough syrup....[name]

## 2012-09-25 NOTE — Telephone Encounter (Signed)
hydromet  

## 2012-10-10 ENCOUNTER — Ambulatory Visit: Payer: Medicare Other | Admitting: Internal Medicine

## 2012-11-07 ENCOUNTER — Ambulatory Visit (INDEPENDENT_AMBULATORY_CARE_PROVIDER_SITE_OTHER): Payer: Medicare Other | Admitting: General Practice

## 2012-11-07 DIAGNOSIS — Z7901 Long term (current) use of anticoagulants: Secondary | ICD-10-CM

## 2012-11-07 DIAGNOSIS — I2699 Other pulmonary embolism without acute cor pulmonale: Secondary | ICD-10-CM

## 2012-11-10 ENCOUNTER — Other Ambulatory Visit (INDEPENDENT_AMBULATORY_CARE_PROVIDER_SITE_OTHER): Payer: Medicare Other

## 2012-11-10 ENCOUNTER — Ambulatory Visit (INDEPENDENT_AMBULATORY_CARE_PROVIDER_SITE_OTHER)
Admission: RE | Admit: 2012-11-10 | Discharge: 2012-11-10 | Disposition: A | Discharge status: Home / Self Care | Payer: Medicare Other | Source: Ambulatory Visit | Attending: Internal Medicine | Admitting: Internal Medicine

## 2012-11-10 ENCOUNTER — Encounter: Payer: Self-pay | Admitting: Internal Medicine

## 2012-11-10 ENCOUNTER — Ambulatory Visit (INDEPENDENT_AMBULATORY_CARE_PROVIDER_SITE_OTHER): Payer: Medicare Other | Admitting: Internal Medicine

## 2012-11-10 VITALS — BP 140/70 | HR 79 | Temp 96.9°F | Ht 65.5 in | Wt 157.5 lb

## 2012-11-10 DIAGNOSIS — R1013 Epigastric pain: Secondary | ICD-10-CM

## 2012-11-10 DIAGNOSIS — R079 Chest pain, unspecified: Secondary | ICD-10-CM

## 2012-11-10 DIAGNOSIS — R5381 Other malaise: Secondary | ICD-10-CM | POA: Diagnosis not present

## 2012-11-10 DIAGNOSIS — J449 Chronic obstructive pulmonary disease, unspecified: Secondary | ICD-10-CM | POA: Diagnosis not present

## 2012-11-10 DIAGNOSIS — E785 Hyperlipidemia, unspecified: Secondary | ICD-10-CM | POA: Diagnosis not present

## 2012-11-10 DIAGNOSIS — R5383 Other fatigue: Secondary | ICD-10-CM | POA: Diagnosis not present

## 2012-11-10 DIAGNOSIS — F329 Major depressive disorder, single episode, unspecified: Secondary | ICD-10-CM

## 2012-11-10 DIAGNOSIS — J8410 Pulmonary fibrosis, unspecified: Secondary | ICD-10-CM | POA: Diagnosis not present

## 2012-11-10 LAB — CBC WITH DIFFERENTIAL/PLATELET
Basophils Absolute: 0 10*3/uL (ref 0.0–0.1)
Basophils Relative: 0.2 % (ref 0.0–3.0)
Eosinophils Relative: 0.9 % (ref 0.0–5.0)
Hemoglobin: 14.3 g/dL (ref 12.0–15.0)
Lymphocytes Relative: 27.6 % (ref 12.0–46.0)
Monocytes Relative: 8.2 % (ref 3.0–12.0)
Neutro Abs: 5.8 10*3/uL (ref 1.4–7.7)
RBC: 4.5 Mil/uL (ref 3.87–5.11)
RDW: 13.4 % (ref 11.5–14.6)
WBC: 9.2 10*3/uL (ref 4.5–10.5)

## 2012-11-10 LAB — BASIC METABOLIC PANEL
BUN: 11 mg/dL (ref 6–23)
CO2: 31 mEq/L (ref 19–32)
Calcium: 9.5 mg/dL (ref 8.4–10.5)
Creatinine, Ser: 0.6 mg/dL (ref 0.4–1.2)
Glucose, Bld: 86 mg/dL (ref 70–99)

## 2012-11-10 LAB — HEPATIC FUNCTION PANEL
Albumin: 3.7 g/dL (ref 3.5–5.2)
Total Protein: 7.7 g/dL (ref 6.0–8.3)

## 2012-11-10 LAB — URINALYSIS, ROUTINE W REFLEX MICROSCOPIC
Ketones, ur: NEGATIVE
Specific Gravity, Urine: <=1.005 (ref 1.000–1.030)
pH: 6 (ref 5.0–8.0)

## 2012-11-10 LAB — SEDIMENTATION RATE: Sed Rate: 53 mm/hr — ABNORMAL HIGH (ref 0–22)

## 2012-11-10 LAB — LIPID PANEL: HDL: 58.4 mg/dL (ref >39.00)

## 2012-11-10 MED ORDER — PANTOPRAZOLE SODIUM 40 MG PO TBEC: 40.0000 mg | DELAYED_RELEASE_TABLET | Freq: Two times a day (BID) | ORAL | Status: DC

## 2012-11-10 MED ORDER — CITALOPRAM HYDROBROMIDE 10 MG PO TABS: 10.0000 mg | ORAL_TABLET | Freq: Every day | ORAL | Status: DC

## 2012-11-10 MED ORDER — TRAMADOL HCL 50 MG PO TABS: 50.0000 mg | ORAL_TABLET | Freq: Four times a day (QID) | ORAL | Status: DC | PRN

## 2012-11-10 NOTE — Assessment & Plan Note (Signed)
Etiology unclear, Exam otherwise benign, to check labs as documented, follow with expectant management  

## 2012-11-10 NOTE — Assessment & Plan Note (Addendum)
stable overall by history and exam, recent data reviewed with pt, and pt to continue medical treatment as before,  to f/u any worsening symptoms or concerns Lab Results  Component Value Date   CHOL 205* 03/01/2011   HDL 67.30 03/01/2011   LDLDIRECT 139.2 03/01/2011   TRIG 162.0* 03/01/2011   CHOLHDL 3 03/01/2011

## 2012-11-10 NOTE — Assessment & Plan Note (Signed)
Exam benign, to incr protonix 40 bid, check lipase adn ua with other labs, consider GI referral if not improved

## 2012-11-10 NOTE — Patient Instructions (Addendum)
Your EKG was ok today Please take all new medication as prescribed- the protonix at twice per day, and the tramadol for pain, and the citalopram for depression Please continue all other medications as before, including the cough medicine as needed Please go to the LAB in the Basement (turn left off the elevator) for the tests to be done today Please go to the XRAY Department in the Basement (go straight as you get off the elevator) for the x-ray testing You will be contacted by phone if any changes need to be made immediately.  Otherwise, you will receive a letter about your results with an explanation, but please check with MyChart first. Thank you for enrolling in MyChart. Please follow the instructions below to securely access your online medical record. MyChart allows you to send messages to your doctor, view your test results, renew your prescriptions, schedule appointments, and more. Please call for referral to Dr Christella Hartigan if the increase in protonix does not help, or the abd pain is worse

## 2012-11-10 NOTE — Assessment & Plan Note (Signed)
Has not accepted tx in the past, now to start citalopram 10 qd, declines cousneling,  to f/u any worsening symptoms or concerns

## 2012-11-10 NOTE — Progress Notes (Signed)
Subjective:    Patient ID: [name], female    DOB: 1930-06-09, 79 y.o.   MRN: [id number]  HPI  Here to f/u; c/o lower sternal pain 1-2 wks, moderate achy type with radiation to the back with back actually hurting more than the lower sternal area today, seem worse to first get in the AM, better after bfast (was gone with food at beginning of symptoms, but now food only helps somewhat with persistent pain); cleaning the house can make it worse but not walking in from the parking lot; has increased cough recently (had acute on chronic) in December, better with last tx, but never seemed back to baseline and now worse; Noted stress test aug 2012 neg for ischemia.  Doesn't get fevers per pt, none now, but not sure if wheezing worse, does have DOE (not sob at rest) but only with doing housework, overall the same per pt, but also not doing much housework for now. S/p appy.  Last ercp/egd with Dr Christella Hartigan 2011.  Denies worsening reflux except for coffee and chocolate, other abd pain, dysphagia, bowel change or blood.   Overall good compliance with treatment, and good medicine tolerability, including the PPI daily, also takes TUMS prn.  Has also had mild worsening depressive symptoms in the past few months, without suicidal ideation or panic though has ongoing anxiety. Past Medical History  Diagnosis Date  . HYPERLIPIDEMIA   . ANXIETY   . DEPRESSION   . COMMON MIGRAINE   . Unspecified hearing loss   . HYPERTENSION   . CORONARY ARTERY DISEASE   . PULMONARY HYPERTENSION, SECONDARY   . CAROTID ARTERY STENOSIS, RIGHT   . Chronic rhinitis   . ALLERGIC RHINITIS   . COPD   . GERD   . DIVERTICULOSIS, COLON   . CYST/PSEUDOCYST, PANCREAS   . OVERACTIVE BLADDER   . OSTEOPOROSIS   . DIZZINESS, CHRONIC   . Pulmonary embolism 2008  . Bronchiectasis 12/11/2011    CT chest dx   Past Surgical History  Procedure Date  . Abdominal hysterectomy   . Egd 1999  . Cholecystectomy   . Tonsillectomy   .  Appendectomy   . S/p sinus surgury     reports that she has never smoked. She has never used smokeless tobacco. She reports that she does not drink alcohol or use illicit drugs. family history includes Cancer in her daughter; Emphysema in her father; Lymphoma in her sister; and Stroke in her father. Allergies  Allergen Reactions  . Amoxicillin-Pot Clavulanate     REACTION: diarrhea  . Ciclesonide     REACTION: bad smell  . Ciprofloxacin Other (See Comments)    dizziness  . Raloxifene     REACTION: risk of recurrent stroke  . Sulfonamide Derivatives     REACTION: tongue swells   Current Outpatient Prescriptions on File Prior to Visit  Medication Sig Dispense Refill  . albuterol (PROVENTIL HFA;VENTOLIN HFA) 108 (90 BASE) MCG/ACT inhaler Inhale 2 puffs into the lungs every 6 (six) hours as needed for wheezing.  1 Inhaler  0  . amLODipine (NORVASC) 5 MG tablet TAKE 1 TABLET BY MOUTH EVERY DAY  90 tablet  3  . aspirin 81 MG EC tablet Take 81 mg by mouth daily.        . calcium carbonate (TUMS) 500 MG chewable tablet Chew 1 tablet by mouth daily as needed.        . Digestive Enzymes (PAPAYA AND ENZYMES) CHEW Chew by mouth. Chew 2  tablets with meals as needed       . loratadine (CLARITIN) 10 MG tablet Take 10 mg by mouth daily.        [name] losartan (COZAAR) 100 MG tablet Take 1 tablet by mouth Daily.      . meclizine (ANTIVERT) 12.5 MG tablet TAKE 1 TABLET BY MOUTH 3 TIMES A DAY AS NEEDED  50 tablet  1  . mometasone (NASONEX) 50 MCG/ACT nasal spray Place 2 sprays into the nose daily as needed.       . Multiple Vitamins-Minerals (MACUVITE) TABS Take 1 tablet by mouth daily.        . pantoprazole (PROTONIX) 40 MG tablet Take 1 tablet (40 mg total) by mouth 2 (two) times daily.  60 tablet  11  . warfarin (COUMADIN) 5 MG tablet TAKE 1 TABLET EVERY DAY  30 tablet  3  . citalopram (CELEXA) 10 MG tablet Take 1 tablet (10 mg total) by mouth daily.  90 tablet  3  . HYDROcodone-homatropine (HYCODAN)  5-1.5 MG/5ML syrup Take 5 mLs by mouth every 8 (eight) hours as needed for cough.  120 mL  0  . levofloxacin (LEVAQUIN) 500 MG tablet Take 1 tablet (500 mg total) by mouth daily.  10 tablet  0   Review of Systems  Constitutional: Negative for unexpected weight change, or unusual diaphoresis  HENT: Negative for tinnitus.   Eyes: Negative for photophobia and visual disturbance.  Respiratory: Negative for choking and stridor.   Gastrointestinal: Negative for vomiting and blood in stool.  Genitourinary: Negative for hematuria and decreased urine volume.  Musculoskeletal: Negative for acute joint swelling Skin: Negative for color change and wound.  Neurological: Negative for tremors and numbness other than noted  Psychiatric/Behavioral: Negative for decreased concentration or  hyperactivity.       Objective:   Physical Exam BP 140/70  Pulse 79  Temp 96.9 F (36.1 C) (Oral)  Ht 5' 5.5" (1.664 m)  Wt 157 lb 8 oz (71.442 kg)  BMI 25.81 kg/m2  SpO2 91% VS noted, not ill appearing Constitutional: Pt appears well-developed and well-nourished.  HENT: Head: NCAT.  Right Ear: External ear normal.  Left Ear: External ear normal.  Eyes: Conjunctivae and EOM are normal. Pupils are equal, round, and reactive to light.  Neck: Normal range of motion. Neck supple.  Cardiovascular: Normal rate and regular rhythm.  ; has tender bilat left and right lower sternal area to palpation that reproduces at least some of her pain, some right costal margin tender as well Pulmonary/Chest: Effort normal and breath sounds normal.  Abd:  Soft, NT, non-distended, + BS - benign exam Neurological: Pt is alert. Not confused  Skin: Skin is warm. No erythema. No rash Psychiatric: Pt behavior is normal. Thought content normal.1+ nervous , + depressed affect    Assessment & Plan:

## 2012-11-10 NOTE — Assessment & Plan Note (Addendum)
One element is chest wall pain but with pain radiating to back (which I think more likely GI related) will need cxr - r/o widening mediastinum, and tramadol prn, also ECG reviewed as per emr  Note:  Total time for pt hx, exam, review of record with pt in the room, determination of diagnoses and plan for further eval and tx is > 40 min, with over 50% spent in coordination and counseling of patient

## 2012-11-11 LAB — LDL CHOLESTEROL, DIRECT: Direct LDL: 122.7 mg/dL

## 2012-11-14 ENCOUNTER — Ambulatory Visit: Payer: Medicare Other

## 2012-11-14 ENCOUNTER — Other Ambulatory Visit: Payer: Self-pay | Admitting: Internal Medicine

## 2012-11-14 DIAGNOSIS — R1013 Epigastric pain: Secondary | ICD-10-CM | POA: Diagnosis not present

## 2012-11-17 LAB — H. PYLORI ANTIBODY, IGG: H Pylori IgG: 0.79 {ISR}

## 2012-12-01 DIAGNOSIS — L821 Other seborrheic keratosis: Secondary | ICD-10-CM | POA: Diagnosis not present

## 2012-12-01 DIAGNOSIS — I8310 Varicose veins of unspecified lower extremity with inflammation: Secondary | ICD-10-CM | POA: Diagnosis not present

## 2012-12-01 DIAGNOSIS — Z85828 Personal history of other malignant neoplasm of skin: Secondary | ICD-10-CM | POA: Diagnosis not present

## 2012-12-01 DIAGNOSIS — L820 Inflamed seborrheic keratosis: Secondary | ICD-10-CM | POA: Diagnosis not present

## 2012-12-07 ENCOUNTER — Other Ambulatory Visit: Payer: Self-pay | Admitting: Internal Medicine

## 2012-12-08 NOTE — Telephone Encounter (Signed)
Med filled.  

## 2012-12-10 ENCOUNTER — Ambulatory Visit: Payer: Medicare Other | Admitting: Internal Medicine

## 2012-12-29 ENCOUNTER — Telehealth: Payer: Self-pay | Admitting: Emergency Medicine

## 2012-12-29 NOTE — Telephone Encounter (Signed)
I spoke with pt and she stated she received a letter from medicare. She is going to bring this with her at her next OV. . She needed nothing further

## 2012-12-30 ENCOUNTER — Ambulatory Visit (INDEPENDENT_AMBULATORY_CARE_PROVIDER_SITE_OTHER): Payer: Medicare Other | Admitting: General Practice

## 2012-12-30 ENCOUNTER — Telehealth: Payer: Self-pay | Admitting: Emergency Medicine

## 2012-12-30 DIAGNOSIS — I2699 Other pulmonary embolism without acute cor pulmonale: Secondary | ICD-10-CM

## 2012-12-30 DIAGNOSIS — Z7901 Long term (current) use of anticoagulants: Secondary | ICD-10-CM

## 2012-12-30 LAB — POCT INR: INR: 3.6

## 2012-12-31 NOTE — Telephone Encounter (Signed)
[facility] please help [name] make sense of this paperwork so that we can figure out what we need to do.   Thank You

## 2013-01-01 NOTE — Telephone Encounter (Signed)
i have papers and will review them [name]

## 2013-01-02 NOTE — Telephone Encounter (Signed)
Faxed pt's papers to [email or web address]  to research and see what needs to be done Tobe Sos

## 2013-01-05 ENCOUNTER — Encounter: Payer: Self-pay | Admitting: Internal Medicine

## 2013-01-05 ENCOUNTER — Other Ambulatory Visit (INDEPENDENT_AMBULATORY_CARE_PROVIDER_SITE_OTHER): Payer: Medicare Other

## 2013-01-05 ENCOUNTER — Ambulatory Visit (INDEPENDENT_AMBULATORY_CARE_PROVIDER_SITE_OTHER): Payer: Medicare Other | Admitting: Internal Medicine

## 2013-01-05 VITALS — BP 142/80 | HR 77 | Temp 98.3°F | Ht 65.0 in | Wt 158.2 lb

## 2013-01-05 DIAGNOSIS — I10 Essential (primary) hypertension: Secondary | ICD-10-CM | POA: Diagnosis not present

## 2013-01-05 DIAGNOSIS — R195 Other fecal abnormalities: Secondary | ICD-10-CM

## 2013-01-05 DIAGNOSIS — R109 Unspecified abdominal pain: Secondary | ICD-10-CM | POA: Diagnosis not present

## 2013-01-05 DIAGNOSIS — F411 Generalized anxiety disorder: Secondary | ICD-10-CM

## 2013-01-05 LAB — BASIC METABOLIC PANEL
Calcium: 9.3 mg/dL (ref 8.4–10.5)
GFR: 108.27 mL/min (ref >60.00)
Glucose, Bld: 94 mg/dL (ref 70–99)
Potassium: 4.2 mEq/L (ref 3.5–5.1)
Sodium: 134 mEq/L — ABNORMAL LOW (ref 135–145)

## 2013-01-05 LAB — CBC WITH DIFFERENTIAL/PLATELET
Basophils Absolute: 0 10*3/uL (ref 0.0–0.1)
Basophils Relative: 0.2 % (ref 0.0–3.0)
HCT: 41.7 % (ref 36.0–46.0)
Hemoglobin: 13.8 g/dL (ref 12.0–15.0)
Lymphs Abs: 2.1 10*3/uL (ref 0.7–4.0)
Monocytes Relative: 11 % (ref 3.0–12.0)
Neutro Abs: 5.2 10*3/uL (ref 1.4–7.7)
RBC: 4.47 Mil/uL (ref 3.87–5.11)
RDW: 13.4 % (ref 11.5–14.6)

## 2013-01-05 LAB — URINALYSIS, ROUTINE W REFLEX MICROSCOPIC
Bilirubin Urine: NEGATIVE
Ketones, ur: NEGATIVE
Leukocytes, UA: NEGATIVE
Specific Gravity, Urine: <=1.005 (ref 1.000–1.030)
Total Protein, Urine: NEGATIVE
Urine Glucose: NEGATIVE
pH: 5.5 (ref 5.0–8.0)

## 2013-01-05 LAB — HEPATIC FUNCTION PANEL
Albumin: 3.7 g/dL (ref 3.5–5.2)
Alkaline Phosphatase: 90 U/L (ref 39–117)
Bilirubin, Direct: 0.1 mg/dL (ref 0.0–0.3)

## 2013-01-05 LAB — SEDIMENTATION RATE: Sed Rate: 77 mm/hr — ABNORMAL HIGH (ref 0–22)

## 2013-01-05 LAB — LIPASE: Lipase: 29 U/L (ref 11.0–59.0)

## 2013-01-05 NOTE — Telephone Encounter (Signed)
Please advise if this is still being worked on or if we can sign off on message. Thanks.

## 2013-01-05 NOTE — Progress Notes (Signed)
Subjective:    Patient ID: [name], female    DOB: Mar 17, 1930, 79 y.o.   MRN: [id number]  HPI  Here to f/u, states has recurrent loose stools x 4 days (3 wks ago) with ? Rusty color, wondering about blood. No melena, but then constipation for 4 days after, and last few days with reduced freq but o/w ok stool, also with lower abd fullness and bloating and nausea with eating, but no vomiting, no fever but has felt chilled and couldn't get warm.  No radiation to the back.  Taking protonix bid since early feb 2014 without diarrhea . H pylori ab neg [id number] Last coumadin check 3.6 last wk and coumadin reduced with f/u. Also c/o exhaustion and fatigue.  Denies worsening depressive symptoms, suicidal ideation, or panic. Last colonoscpoy 2009 with Dr Ewing Schlein, but if needs further eval, her preference is to return to Dr Russella Dar who has handled her EUS in the past.  Pt denies chest pain, increased sob or doe, wheezing, orthopnea, PND, increased LE swelling, palpitations, dizziness or syncope  - essentially no change from baseline per pt. Denies worsening depressive symptoms, suicidal ideation, or panic; has ongoing anxiety, not increased recently.  Past Medical History  Diagnosis Date  . HYPERLIPIDEMIA   . ANXIETY   . DEPRESSION   . COMMON MIGRAINE   . Unspecified hearing loss   . HYPERTENSION   . CORONARY ARTERY DISEASE   . PULMONARY HYPERTENSION, SECONDARY   . CAROTID ARTERY STENOSIS, RIGHT   . Chronic rhinitis   . ALLERGIC RHINITIS   . COPD   . GERD   . DIVERTICULOSIS, COLON   . CYST/PSEUDOCYST, PANCREAS   . OVERACTIVE BLADDER   . OSTEOPOROSIS   . DIZZINESS, CHRONIC   . Pulmonary embolism 2008  . Bronchiectasis 12/11/2011    CT chest dx   Past Surgical History  Procedure Laterality Date  . Abdominal hysterectomy    . Egd  1999  . Cholecystectomy    . Tonsillectomy    . Appendectomy    . S/p sinus surgury      reports that she has never smoked. She has never used smokeless tobacco.  She reports that she does not drink alcohol or use illicit drugs. family history includes Cancer in her daughter; Emphysema in her father; Lymphoma in her sister; and Stroke in her father. Allergies  Allergen Reactions  . Amoxicillin-Pot Clavulanate     REACTION: diarrhea  . Ciclesonide     REACTION: bad smell  . Ciprofloxacin Other (See Comments)    dizziness  . Raloxifene     REACTION: risk of recurrent stroke  . Sulfonamide Derivatives     REACTION: tongue swells   Current Outpatient Prescriptions on File Prior to Visit  Medication Sig Dispense Refill  . albuterol (PROVENTIL HFA;VENTOLIN HFA) 108 (90 BASE) MCG/ACT inhaler Inhale 2 puffs into the lungs every 6 (six) hours as needed for wheezing.  1 Inhaler  0  . amLODipine (NORVASC) 5 MG tablet TAKE 1 TABLET BY MOUTH EVERY DAY  90 tablet  3  . aspirin 81 MG EC tablet Take 81 mg by mouth daily.        . calcium carbonate (TUMS) 500 MG chewable tablet Chew 1 tablet by mouth daily as needed.        . Digestive Enzymes (PAPAYA AND ENZYMES) CHEW Chew by mouth. Chew 2 tablets with meals as needed       . loratadine (CLARITIN) 10 MG tablet Take 10  mg by mouth daily.        [name] losartan (COZAAR) 100 MG tablet Take 1 tablet by mouth Daily.      [name] losartan (COZAAR) 100 MG tablet TAKE 1 TABLET BY MOUTH EVERY DAY  30 tablet  9  . meclizine (ANTIVERT) 12.5 MG tablet TAKE 1 TABLET BY MOUTH 3 TIMES A DAY AS NEEDED  50 tablet  1  . mometasone (NASONEX) 50 MCG/ACT nasal spray Place 2 sprays into the nose daily as needed.       . Multiple Vitamins-Minerals (MACUVITE) TABS Take 1 tablet by mouth daily.        . pantoprazole (PROTONIX) 40 MG tablet Take 1 tablet (40 mg total) by mouth 2 (two) times daily.  60 tablet  11  . traMADol (ULTRAM) 50 MG tablet Take 1 tablet (50 mg total) by mouth every 6 (six) hours as needed for pain.  60 tablet  1  . warfarin (COUMADIN) 5 MG tablet TAKE 1 TABLET EVERY DAY  30 tablet  3   No current facility-administered  medications on file prior to visit.   Review of Systems  Constitutional: Negative for unexpected weight change, or unusual diaphoresis  HENT: Negative for tinnitus.   Eyes: Negative for photophobia and visual disturbance.  Respiratory: Negative for choking and stridor.   Genitourinary: Negative for hematuria and decreased urine volume.  Musculoskeletal: Negative for acute joint swelling Skin: Negative for color change and wound.  Neurological: Negative for tremors and numbness other than noted  Psychiatric/Behavioral: Negative for decreased concentration or  hyperactivity.       Objective:   Physical Exam BP 142/80  Pulse 77  Temp(Src) 98.3 F (36.8 C) (Oral)  Ht 5\' 5"  (1.651 m)  Wt 158 lb 4 oz (71.782 kg)  BMI 26.33 kg/m2  SpO2 92% VS noted, fatigued, ? Mild ill appaering Constitutional: Pt appears well-developed and well-nourished.  HENT: Head: NCAT.  Right Ear: External ear normal.  Left Ear: External ear normal.  Eyes: Conjunctivae and EOM are normal. Pupils are equal, round, and reactive to light.  Neck: Normal range of motion. Neck supple.  Cardiovascular: Normal rate and regular rhythm.   Pulmonary/Chest: Effort normal and breath sounds decreased with isolated right wheeze (hx of bronchiectasis)  Abd:  Soft,  non-distended, + BS, has minor tender to epigastrium but more mild tender to LUQ/LLQ and low mid abdomen, without guarding DRE:  Normal tone, no mass, minimal stool/soft, heme neg Neurological: Pt is alert. Not confused  Skin: Skin is warm. No erythema.  Psychiatric: Pt behavior is normal. Thought content normal. 1+ nervous    Assessment & Plan:

## 2013-01-05 NOTE — Assessment & Plan Note (Signed)
stable overall by history and exam, recent data reviewed with pt, and pt to continue medical treatment as before,  to f/u any worsening symptoms or concerns BP Readings from Last 3 Encounters:  01/05/13 142/80  11/10/12 140/70  09/23/12 168/72

## 2013-01-05 NOTE — Assessment & Plan Note (Addendum)
diff includes ischemic colitis, c diff, UTI vs other- for lab eval including c diff stool test, including hgb which if abnormal she will need ct and/or GI referral  Note:  Total time for pt hx, exam, review of record with pt in the room, determination of diagnoses and plan for further eval and tx is > 40 min, with over 50% spent in coordination and counseling of patient

## 2013-01-05 NOTE — Telephone Encounter (Signed)
Still working on this [employer]

## 2013-01-05 NOTE — Assessment & Plan Note (Signed)
stable overall by history and exam, and pt to continue medical treatment as before,  to f/u any worsening symptoms or concerns 

## 2013-01-05 NOTE — Patient Instructions (Addendum)
Please continue all other medications as before Please keep your appointments with your specialists as you have planned - the coumadin Please go to the LAB in the Basement (turn left off the elevator) for the tests to be done today You will be contacted by phone if any changes need to be made immediately.  Otherwise, you will receive a letter about your results with an explanation, but please check with MyChart first. Thank you for enrolling in MyChart. Please follow the instructions below to securely access your online medical record. MyChart allows you to send messages to your doctor, view your test results, renew your prescriptions, schedule appointments, and more. As you activate your MyChart account and need any technical assistance, please call the MyChart technical support line at [phone]) or email your question to [email or web address]. If you email your question(s), please include your name, a return phone number and the best time to reach you

## 2013-01-06 NOTE — Telephone Encounter (Signed)
Faxed these papers to [employer] [email or web address] she is going to refile this on pt's insurance Tobe Sos

## 2013-01-13 ENCOUNTER — Other Ambulatory Visit: Payer: Medicare Other

## 2013-01-13 DIAGNOSIS — R195 Other fecal abnormalities: Secondary | ICD-10-CM | POA: Diagnosis not present

## 2013-01-13 DIAGNOSIS — R109 Unspecified abdominal pain: Secondary | ICD-10-CM | POA: Diagnosis not present

## 2013-01-14 LAB — CLOSTRIDIUM DIFFICILE EIA: CDIFTX: NEGATIVE

## 2013-01-20 ENCOUNTER — Ambulatory Visit (INDEPENDENT_AMBULATORY_CARE_PROVIDER_SITE_OTHER): Payer: Medicare Other | Admitting: General Practice

## 2013-01-20 DIAGNOSIS — Z7901 Long term (current) use of anticoagulants: Secondary | ICD-10-CM | POA: Diagnosis not present

## 2013-01-20 DIAGNOSIS — I2699 Other pulmonary embolism without acute cor pulmonale: Secondary | ICD-10-CM

## 2013-01-20 LAB — POCT INR: INR: 2.9

## 2013-01-21 DIAGNOSIS — H353190 Nonexudative age-related macular degeneration, unspecified eye, stage unspecified: Secondary | ICD-10-CM | POA: Diagnosis not present

## 2013-01-21 DIAGNOSIS — Z961 Presence of intraocular lens: Secondary | ICD-10-CM | POA: Diagnosis not present

## 2013-01-21 DIAGNOSIS — H43399 Other vitreous opacities, unspecified eye: Secondary | ICD-10-CM | POA: Diagnosis not present

## 2013-01-21 DIAGNOSIS — H35039 Hypertensive retinopathy, unspecified eye: Secondary | ICD-10-CM | POA: Diagnosis not present

## 2013-01-22 ENCOUNTER — Ambulatory Visit (INDEPENDENT_AMBULATORY_CARE_PROVIDER_SITE_OTHER): Payer: Medicare Other | Admitting: Emergency Medicine

## 2013-01-22 ENCOUNTER — Encounter: Payer: Self-pay | Admitting: Emergency Medicine

## 2013-01-22 VITALS — BP 160/78 | HR 79 | Temp 98.1°F | Ht 65.5 in | Wt 157.4 lb

## 2013-01-22 DIAGNOSIS — J479 Bronchiectasis, uncomplicated: Secondary | ICD-10-CM

## 2013-01-22 MED ORDER — LEVOFLOXACIN 500 MG PO TABS: 500.0000 mg | ORAL_TABLET | Freq: Every day | ORAL | Status: DC

## 2013-01-22 MED ORDER — FLUTTER DEVI: 1.0000 | Status: DC | PRN

## 2013-01-22 NOTE — Patient Instructions (Addendum)
Please continue your current medications We will get you a flutter valve. Try using it once a day Take levaquin 500mg  daily for 10 days Walking oximetry today to see if you qualify for oxygen. If so then we will investigate why your insurance is rejecting this, insure that you have appropriate orders to [employer].  Follow with Dr Delton Coombes in 1 month

## 2013-01-22 NOTE — Progress Notes (Signed)
Subjective:    Patient ID: [name], female    DOB: November 19, 1929   MRN: [id number]  HPI  29 yowf never smoker, allergic rhinitis, hx bilat PE in September 2008,with asociated Pulm HTN (PASP 70 with intact LV fxn). Started on Spiriva after PFT's showed mild to mod AFL without BD response; she stopped this due to dry eyes, L eye pain.  12/20/11 self referred to [facility] for eval of sob.  12/20/11  ov/ Wert  cc sob x 2 months gradual onset progressive to point where has trouble vacuuming or scrubbing the floors better when wears 02 as was told to do.  Does not wear 02 at hs. Says only has" large tank"  at home. "No better"  with abx though acutally was assoc with cough that was productive of green now white mostly in am but then still cough all day long.  rec Bronchiectasis =   you have scarring of your bronchial tubes which means that they don't function perfectly normally and mucus tends to pool in certain areas of your lung which can cause pneumonia and further scarring of your lung and bronchial tubes Whenever you develop cough congestion take mucinex or mucinex dm > these will help keep the mucus loose and flowing but if your condition worsens you need to seek help immediately preferably here or somewhere inside the [facility] system to compare xrays ( worse = darker or bloody mucus or pain on breathing in)  Protonix (pantoprozole) 40 mg Take 30-60 min before first meal of the day  GERD (REFLUX)   01/24/2012 f/u ov/Wert cc Prod cough (Green ) for 3 weeks off and on - Sinus drainage - Head congestion - Soreness in L face - SOB on exertion but not really limiting desired activities.  Acute OV 10/'13 --  Complains of head congestion w/ PND, prod cough with green mucus, occ wheezing, occ chills x2-3weeks. Cough is getting worse.  Mucinex is not helping.  No hemoptysis or chest pain. No fever   ROV 01/22/13 -- 79 yo woman, hx B PE on coumadin, bronchiectasis, moderate AFL on spiro, allergic rhinitis,  GERD. She is maintained on protonix bid, nasonex spray, loratadine. She has had more nasal congestion, more cough and wheeze. She is coughing up green phlegm.        Objective:   Physical Exam Filed Vitals:   01/22/13 1201  BP: 160/78  Pulse: 79  Temp: 98.1 F (36.7 C)  Gen: Pleasant, well-nourished, in no distress,  normal affect  ENT: No lesions,  mouth clear,  oropharynx clear, no postnasal drip  Neck: No JVD, no TMG, no carotid bruits  Lungs: No use of accessory muscles, no dullness to percussion, clear without rales or rhonchi  Cardiovascular: RRR, heart sounds normal, no murmur or gallops, no peripheral edema  Abdomen: soft and NT, no HSM,  BS normal  Musculoskeletal: No deformities, no cyanosis or clubbing  Neuro: alert, non focal  Skin: Warm, no lesions or rashes    12/07/11 CT 1. Waxing and waning reticulonodular interstitial opacities in the  lungs over the last 5 years, with tree in bud morphology suggesting  atypical infectious bronchiolitis and airway plugging.  2. Bronchiectasis and progressive atelectasis in the right middle  lobe of the last 5 years, likely secondary to recurrent infection.  Given the overall volume loss, I am doubtful of malignancy in this  vicinity, and active pneumonia is not observed.  3. Mosaic attenuation with reduced conspicuity of vessels in the  more lucent areas of lungs, favoring either chronic pulmonary  embolus or obstructive bronchiolitis.  4. Coronary artery atherosclerosis       Assessment & Plan:  Bronchiectasis With an apparent flare and bronchitis. She is receiving paperwork that states that she doesn't qualify for her O2.  - walking oximetry today - PCC's to help sort out the oxygen question thru [employer] - flutter valve - levaquin 500mg  x 10 days.  - rov 1 month

## 2013-01-22 NOTE — Assessment & Plan Note (Signed)
With an apparent flare and bronchitis. She is receiving paperwork that states that she doesn't qualify for her O2.  - walking oximetry today - PCC's to help sort out the oxygen question thru [employer] - flutter valve - levaquin 500mg  x 10 days.  - rov 1 month

## 2013-02-11 ENCOUNTER — Other Ambulatory Visit: Payer: Self-pay | Admitting: Dermatology

## 2013-02-11 DIAGNOSIS — C44721 Squamous cell carcinoma of skin of unspecified lower limb, including hip: Secondary | ICD-10-CM | POA: Diagnosis not present

## 2013-02-11 DIAGNOSIS — Z85828 Personal history of other malignant neoplasm of skin: Secondary | ICD-10-CM | POA: Diagnosis not present

## 2013-02-11 DIAGNOSIS — L821 Other seborrheic keratosis: Secondary | ICD-10-CM | POA: Diagnosis not present

## 2013-02-17 ENCOUNTER — Ambulatory Visit (INDEPENDENT_AMBULATORY_CARE_PROVIDER_SITE_OTHER): Payer: Medicare Other | Admitting: General Practice

## 2013-02-17 DIAGNOSIS — I2699 Other pulmonary embolism without acute cor pulmonale: Secondary | ICD-10-CM

## 2013-02-17 DIAGNOSIS — Z7901 Long term (current) use of anticoagulants: Secondary | ICD-10-CM | POA: Diagnosis not present

## 2013-02-17 LAB — POCT INR: INR: 2.9

## 2013-02-19 ENCOUNTER — Other Ambulatory Visit: Payer: Self-pay | Admitting: Dermatology

## 2013-02-19 DIAGNOSIS — Z85828 Personal history of other malignant neoplasm of skin: Secondary | ICD-10-CM | POA: Diagnosis not present

## 2013-02-19 DIAGNOSIS — C44721 Squamous cell carcinoma of skin of unspecified lower limb, including hip: Secondary | ICD-10-CM | POA: Diagnosis not present

## 2013-02-20 ENCOUNTER — Ambulatory Visit: Payer: Medicare Other | Admitting: Emergency Medicine

## 2013-03-24 ENCOUNTER — Ambulatory Visit (INDEPENDENT_AMBULATORY_CARE_PROVIDER_SITE_OTHER): Payer: Medicare Other | Admitting: General Practice

## 2013-03-24 DIAGNOSIS — Z7901 Long term (current) use of anticoagulants: Secondary | ICD-10-CM | POA: Diagnosis not present

## 2013-03-24 DIAGNOSIS — I2699 Other pulmonary embolism without acute cor pulmonale: Secondary | ICD-10-CM

## 2013-03-24 LAB — POCT INR: INR: 1.4

## 2013-04-14 ENCOUNTER — Ambulatory Visit (INDEPENDENT_AMBULATORY_CARE_PROVIDER_SITE_OTHER): Payer: Medicare Other | Admitting: General Practice

## 2013-04-14 DIAGNOSIS — Z7901 Long term (current) use of anticoagulants: Secondary | ICD-10-CM | POA: Diagnosis not present

## 2013-04-14 DIAGNOSIS — I2699 Other pulmonary embolism without acute cor pulmonale: Secondary | ICD-10-CM

## 2013-04-28 ENCOUNTER — Ambulatory Visit (INDEPENDENT_AMBULATORY_CARE_PROVIDER_SITE_OTHER): Payer: Medicare Other | Admitting: Emergency Medicine

## 2013-04-28 ENCOUNTER — Encounter: Payer: Self-pay | Admitting: Emergency Medicine

## 2013-04-28 VITALS — BP 160/70 | HR 85 | Temp 98.3°F | Ht 65.0 in | Wt 157.6 lb

## 2013-04-28 DIAGNOSIS — J479 Bronchiectasis, uncomplicated: Secondary | ICD-10-CM | POA: Diagnosis not present

## 2013-04-28 MED ORDER — MECLIZINE HCL 12.5 MG PO TABS: 12.5000 mg | ORAL_TABLET | Freq: Three times a day (TID) | ORAL | Status: DC | PRN

## 2013-04-28 NOTE — Patient Instructions (Addendum)
Please continue your flutter valve daily Keep albuterol available to use if needed Follow with Dr Delton Coombes in 4 months or sooner if you have any problems.

## 2013-04-28 NOTE — Assessment & Plan Note (Signed)
continue albuterol prn Flutter qd rov 4

## 2013-04-28 NOTE — Progress Notes (Signed)
Subjective:    Patient ID: [name], female    DOB: 1930/06/04   MRN: [id number]  HPI  9 yowf never smoker, allergic rhinitis, hx bilat PE in September 2008,with asociated Pulm HTN (PASP 70 with intact LV fxn). Started on Spiriva after PFT's showed mild to mod AFL without BD response; she stopped this due to dry eyes, L eye pain.  12/20/11 self referred to [facility] for eval of sob.  12/20/11  ov/ Wert  cc sob x 2 months gradual onset progressive to point where has trouble vacuuming or scrubbing the floors better when wears 02 as was told to do.  Does not wear 02 at hs. Says only has" large tank"  at home. "No better"  with abx though acutally was assoc with cough that was productive of green now white mostly in am but then still cough all day long.  rec Bronchiectasis =   you have scarring of your bronchial tubes which means that they don't function perfectly normally and mucus tends to pool in certain areas of your lung which can cause pneumonia and further scarring of your lung and bronchial tubes Whenever you develop cough congestion take mucinex or mucinex dm > these will help keep the mucus loose and flowing but if your condition worsens you need to seek help immediately preferably here or somewhere inside the [facility] to compare xrays ( worse = darker or bloody mucus or pain on breathing in)  Protonix (pantoprozole) 40 mg Take 30-60 min before first meal of the day  GERD (REFLUX)   01/24/2012 f/u ov/Wert cc Prod cough (Green ) for 3 weeks off and on - Sinus drainage - Head congestion - Soreness in L face - SOB on exertion but not really limiting desired activities.  Acute OV 10/'13 --  Complains of head congestion w/ PND, prod cough with green mucus, occ wheezing, occ chills x2-3weeks. Cough is getting worse.  Mucinex is not helping.  No hemoptysis or chest pain. No fever   ROV 01/22/13 -- 79 yo woman, hx B PE on coumadin, bronchiectasis, moderate AFL on spiro, allergic rhinitis,  GERD. She is maintained on protonix bid, nasonex spray, loratadine. She has had more nasal congestion, more cough and wheeze. She is coughing up green phlegm.   ROV 04/28/13 -- 79 yo woman, B PE on coumadin, bronchiectasis, moderate AFL on spiro, allergic rhinitis, GERD. She reports good mucous production in the am, dark yellow.  Breathing is good, good exercise tolerance. No flares since last time.        Objective:   Physical Exam Filed Vitals:   04/28/13 1131  BP: 160/70  Pulse: 85  Temp: 98.3 F (36.8 C)  Gen: Pleasant, well-nourished, in no distress,  normal affect  ENT: No lesions,  mouth clear,  oropharynx clear, no postnasal drip  Neck: No JVD, no TMG, no carotid bruits  Lungs: No use of accessory muscles, no dullness to percussion, clear without rales or rhonchi  Cardiovascular: RRR, heart sounds normal, no murmur or gallops, no peripheral edema  Abdomen: soft and NT, no HSM,  BS normal  Musculoskeletal: No deformities, no cyanosis or clubbing  Neuro: alert, non focal  Skin: Warm, no lesions or rashes    12/07/11 CT 1. Waxing and waning reticulonodular interstitial opacities in the  lungs over the last 5 years, with tree in bud morphology suggesting  atypical infectious bronchiolitis and airway plugging.  2. Bronchiectasis and progressive atelectasis in the right middle  lobe  of the last 5 years, likely secondary to recurrent infection.  Given the overall volume loss, I am doubtful of malignancy in this  vicinity, and active pneumonia is not observed.  3. Mosaic attenuation with reduced conspicuity of vessels in the  more lucent areas of lungs, favoring either chronic pulmonary  embolus or obstructive bronchiolitis.  4. Coronary artery atherosclerosis       Assessment & Plan:  Bronchiectasis continue albuterol prn Flutter qd rov 4

## 2013-04-29 ENCOUNTER — Other Ambulatory Visit: Payer: Self-pay | Admitting: Internal Medicine

## 2013-05-12 ENCOUNTER — Telehealth: Payer: Self-pay | Admitting: Internal Medicine

## 2013-05-12 ENCOUNTER — Ambulatory Visit (INDEPENDENT_AMBULATORY_CARE_PROVIDER_SITE_OTHER): Payer: Medicare Other | Admitting: General Practice

## 2013-05-12 DIAGNOSIS — Z7901 Long term (current) use of anticoagulants: Secondary | ICD-10-CM | POA: Diagnosis not present

## 2013-05-12 DIAGNOSIS — I2699 Other pulmonary embolism without acute cor pulmonale: Secondary | ICD-10-CM

## 2013-05-12 NOTE — Telephone Encounter (Signed)
error 

## 2013-05-26 ENCOUNTER — Other Ambulatory Visit (INDEPENDENT_AMBULATORY_CARE_PROVIDER_SITE_OTHER): Payer: Medicare Other

## 2013-05-26 ENCOUNTER — Encounter: Payer: Self-pay | Admitting: Internal Medicine

## 2013-05-26 ENCOUNTER — Telehealth: Payer: Self-pay | Admitting: Internal Medicine

## 2013-05-26 ENCOUNTER — Ambulatory Visit (INDEPENDENT_AMBULATORY_CARE_PROVIDER_SITE_OTHER): Payer: Medicare Other | Admitting: Internal Medicine

## 2013-05-26 VITALS — BP 162/72 | HR 71 | Temp 98.7°F | Ht 65.5 in | Wt 156.1 lb

## 2013-05-26 DIAGNOSIS — I10 Essential (primary) hypertension: Secondary | ICD-10-CM

## 2013-05-26 DIAGNOSIS — K921 Melena: Secondary | ICD-10-CM | POA: Diagnosis not present

## 2013-05-26 DIAGNOSIS — R7309 Other abnormal glucose: Secondary | ICD-10-CM

## 2013-05-26 DIAGNOSIS — J479 Bronchiectasis, uncomplicated: Secondary | ICD-10-CM

## 2013-05-26 DIAGNOSIS — R7302 Impaired glucose tolerance (oral): Secondary | ICD-10-CM

## 2013-05-26 DIAGNOSIS — K219 Gastro-esophageal reflux disease without esophagitis: Secondary | ICD-10-CM

## 2013-05-26 LAB — URINALYSIS, ROUTINE W REFLEX MICROSCOPIC
Bilirubin Urine: NEGATIVE
Nitrite: NEGATIVE
Urobilinogen, UA: 0.2 (ref 0.0–1.0)
pH: 5.5 (ref 5.0–8.0)

## 2013-05-26 LAB — BASIC METABOLIC PANEL
CO2: 28 mEq/L (ref 19–32)
Chloride: 102 mEq/L (ref 96–112)
Glucose, Bld: 106 mg/dL — ABNORMAL HIGH (ref 70–99)
Potassium: 3.6 mEq/L (ref 3.5–5.1)
Sodium: 134 mEq/L — ABNORMAL LOW (ref 135–145)

## 2013-05-26 LAB — CBC WITH DIFFERENTIAL/PLATELET
Basophils Absolute: 0 10*3/uL (ref 0.0–0.1)
HCT: 41.1 % (ref 36.0–46.0)
Lymphs Abs: 2.2 10*3/uL (ref 0.7–4.0)
Monocytes Relative: 8.9 % (ref 3.0–12.0)
Platelets: 243 10*3/uL (ref 150.0–400.0)
RDW: 13.4 % (ref 11.5–14.6)

## 2013-05-26 LAB — HEPATIC FUNCTION PANEL
AST: 26 U/L (ref 0–37)
Albumin: 3.6 g/dL (ref 3.5–5.2)
Alkaline Phosphatase: 75 U/L (ref 39–117)
Total Protein: 7.6 g/dL (ref 6.0–8.3)

## 2013-05-26 MED ORDER — LEVOFLOXACIN 250 MG PO TABS: 250.0000 mg | ORAL_TABLET | Freq: Every day | ORAL | Status: DC

## 2013-05-26 MED ORDER — LEVOFLOXACIN 500 MG PO TABS: 500.0000 mg | ORAL_TABLET | Freq: Every day | ORAL | Status: DC

## 2013-05-26 MED ORDER — OMEPRAZOLE-SODIUM BICARBONATE 40-1680 MG PO PACK: 1.0000 | PACK | Freq: Two times a day (BID) | ORAL | Status: DC

## 2013-05-26 NOTE — Progress Notes (Signed)
Subjective:    Patient ID: [name], female    DOB: 1929/11/14, 79 y.o.   MRN: [id number]  HPI  " I just dont feel good and I havent felt good in a while." Menitons ST, HA, general weakness and malaise, bilat ear fullness that bothers her, cough, and nausea worse with certain foods ecxept for light foods, appetite decresaed, lost 2 lbs overall since mar 2014.  Carries TUMS with her everywhere, and takes several per day, in addition to bid protonix.  Has intermittent dysuria, as well as freq and urgent, no blood.  Has still some intermittent BRBPR, has put off seeing GI,m but remembers she was supposed to f/u colonoscopy at 5 yrs. Last done 2010.   Past Medical History  Diagnosis Date  . HYPERLIPIDEMIA   . ANXIETY   . DEPRESSION   . COMMON MIGRAINE   . Unspecified hearing loss   . HYPERTENSION   . CORONARY ARTERY DISEASE   . PULMONARY HYPERTENSION, SECONDARY   . CAROTID ARTERY STENOSIS, RIGHT   . Chronic rhinitis   . ALLERGIC RHINITIS   . COPD   . GERD   . DIVERTICULOSIS, COLON   . CYST/PSEUDOCYST, PANCREAS   . OVERACTIVE BLADDER   . OSTEOPOROSIS   . DIZZINESS, CHRONIC   . Pulmonary embolism 2008  . Bronchiectasis 12/11/2011    CT chest dx   Past Surgical History  Procedure Laterality Date  . Abdominal hysterectomy    . Egd  1999  . Cholecystectomy    . Tonsillectomy    . Appendectomy    . S/p sinus surgury      reports that she has never smoked. She has never used smokeless tobacco. She reports that she does not drink alcohol or use illicit drugs. family history includes Cancer in her daughter; Emphysema in her father; Lymphoma in her sister; Stroke in her father. Allergies  Allergen Reactions  . Amoxicillin-Pot Clavulanate     REACTION: diarrhea  . Ciclesonide     REACTION: bad smell  . Ciprofloxacin Other (See Comments)    dizziness  . Raloxifene     REACTION: risk of recurrent stroke  . Sulfonamide Derivatives     REACTION: tongue swells   Current  Outpatient Prescriptions on File Prior to Visit  Medication Sig Dispense Refill  . albuterol (PROVENTIL HFA;VENTOLIN HFA) 108 (90 BASE) MCG/ACT inhaler Inhale 2 puffs into the lungs every 6 (six) hours as needed for wheezing.  1 Inhaler  0  . amLODipine (NORVASC) 5 MG tablet TAKE 1 TABLET BY MOUTH EVERY DAY  90 tablet  2  . aspirin 81 MG EC tablet Take 81 mg by mouth daily.        . calcium carbonate (TUMS) 500 MG chewable tablet Chew 1 tablet by mouth daily as needed.        . Digestive Enzymes (PAPAYA AND ENZYMES) CHEW Chew by mouth. Chew 2 tablets with meals as needed       . loratadine (CLARITIN) 10 MG tablet Take 10 mg by mouth daily.        [name] losartan (COZAAR) 100 MG tablet Take 1 tablet by mouth Daily.      . meclizine (ANTIVERT) 12.5 MG tablet Take 1 tablet (12.5 mg total) by mouth 3 (three) times daily as needed.  50 tablet  4  . mometasone (NASONEX) 50 MCG/ACT nasal spray Place 2 sprays into the nose daily as needed.       . Multiple Vitamins-Minerals (MACUVITE) TABS  Take 1 tablet by mouth daily.        . pantoprazole (PROTONIX) 40 MG tablet Take 1 tablet (40 mg total) by mouth 2 (two) times daily.  60 tablet  11  . Respiratory Therapy Supplies (FLUTTER) DEVI 1 Device by Does not apply route as needed.  1 each  0  . warfarin (COUMADIN) 5 MG tablet TAKE 1 TABLET EVERY DAY  30 tablet  3   No current facility-administered medications on file prior to visit.   Review of Systems  Constitutional: Negative for unexpected weight change, or unusual diaphoresis  HENT: Negative for tinnitus.   Eyes: Negative for photophobia and visual disturbance.  Respiratory: Negative for choking and stridor.   Gastrointestinal: Negative for vomiting and blood in stool.  Genitourinary: Negative for hematuria and decreased urine volume.  Musculoskeletal: Negative for acute joint swelling Skin: Negative for color change and wound.  Neurological: Negative for tremors and numbness other than noted   Psychiatric/Behavioral: Negative for decreased concentration or  hyperactivity.       Objective:   Physical Exam BP 162/72  Pulse 71  Temp(Src) 98.7 F (37.1 C) (Oral)  Ht 5' 5.5" (1.664 m)  Wt 156 lb 2 oz (70.818 kg)  BMI 25.58 kg/m2  SpO2 93% VS noted, somewhat warm to touch, mild ill appearing, fatigued Constitutional: Pt appears well-developed and well-nourished.  HENT: Head: NCAT.  Right Ear: External ear normal.  Left Ear: External ear normal.  Eyes: Conjunctivae and EOM are normal. Pupils are equal, round, and reactive to light.  Neck: Normal range of motion. Neck supple.  Cardiovascular: Normal rate and regular rhythm.   Pulmonary/Chest: Effort normal and breath sounds decreased.  Abd:  Soft, NT, non-distended, + BS Neurological: Pt is alert. Not confused  Skin: Skin is warm. No erythema.  Psychiatric: Pt behavior is normal. Thought content normal.     Assessment & Plan:

## 2013-05-26 NOTE — Assessment & Plan Note (Signed)
Ok for [employer] course ,  to f/u any worsening symptoms or concerns

## 2013-05-26 NOTE — Assessment & Plan Note (Signed)
stable overall by history and exam, recent data reviewed with pt, and pt to continue medical treatment as before,  to f/u any worsening symptoms or concerns Lab Results  Component Value Date   HGBA1C 5.7 09/21/2011

## 2013-05-26 NOTE — Telephone Encounter (Signed)
Message copied by Corwin Levins on Tue May 26, 2013  3:56 PM ------      Message from: Scharlene Gloss B      Created: Tue May 26, 2013  3:01 PM       The patient would rather have a lower dose antibiotic sent to pharmacy due to the fact she is already having nausea ------

## 2013-05-26 NOTE — Assessment & Plan Note (Addendum)
Ok to try change to zegerid, and refer GI  Note:  Total time for pt hx, exam, review of record with pt in the room, determination of diagnoses and plan for further eval and tx is > 40 min, with over 50% spent in coordination and counseling of patient

## 2013-05-26 NOTE — Assessment & Plan Note (Signed)
stable overall by history and exam, recent data reviewed with pt, and pt to continue medical treatment as before,  to f/u any worsening symptoms or concerns BP Readings from Last 3 Encounters:  05/26/13 162/72  04/28/13 160/70  01/22/13 160/78

## 2013-05-26 NOTE — Patient Instructions (Signed)
Please take all new medication as prescribed - the antibiotic, and the zegerid for stomach acid OK to stop the TUMS and protonix Please continue all other medications as before, and refills have been done if requested. Please have the pharmacy call with any other refills you may need. Please keep your appointments with your specialists as you have planned  You will be contacted regarding the referral for: GI  Please go to the LAB in the Basement (turn left off the elevator) for the tests to be done today  You will be contacted by phone if any changes need to be made immediately.  Otherwise, you will receive a letter about your results with an explanation, but please check with MyChart first.  Please remember to sign up for My Chart if you have not done so, as this will be important to you in the future with finding out test results, communicating by private email, and scheduling acute appointments online when needed.  Please return in 3 months, or sooner if needed

## 2013-05-26 NOTE — Telephone Encounter (Signed)
Patient informed. 

## 2013-05-26 NOTE — Assessment & Plan Note (Signed)
Also for GI eval, may need colnoscopy earlier

## 2013-05-26 NOTE — Telephone Encounter (Signed)
levaquin changed from 500 to 250 mg

## 2013-06-01 ENCOUNTER — Encounter: Payer: Self-pay | Admitting: Gastroenterology

## 2013-06-03 ENCOUNTER — Other Ambulatory Visit: Payer: Self-pay | Admitting: Emergency Medicine

## 2013-06-12 ENCOUNTER — Ambulatory Visit (INDEPENDENT_AMBULATORY_CARE_PROVIDER_SITE_OTHER): Payer: Medicare Other | Admitting: General Practice

## 2013-06-12 DIAGNOSIS — I2699 Other pulmonary embolism without acute cor pulmonale: Secondary | ICD-10-CM | POA: Diagnosis not present

## 2013-06-12 DIAGNOSIS — Z7901 Long term (current) use of anticoagulants: Secondary | ICD-10-CM | POA: Diagnosis not present

## 2013-06-15 ENCOUNTER — Encounter: Payer: Self-pay | Admitting: Gastroenterology

## 2013-06-15 ENCOUNTER — Telehealth: Payer: Self-pay

## 2013-06-15 ENCOUNTER — Ambulatory Visit (INDEPENDENT_AMBULATORY_CARE_PROVIDER_SITE_OTHER): Payer: Medicare Other | Admitting: Gastroenterology

## 2013-06-15 VITALS — BP 144/62 | HR 80 | Ht 63.75 in | Wt 154.1 lb

## 2013-06-15 DIAGNOSIS — K921 Melena: Secondary | ICD-10-CM | POA: Diagnosis not present

## 2013-06-15 DIAGNOSIS — R195 Other fecal abnormalities: Secondary | ICD-10-CM

## 2013-06-15 DIAGNOSIS — Z7901 Long term (current) use of anticoagulants: Secondary | ICD-10-CM

## 2013-06-15 DIAGNOSIS — R198 Other specified symptoms and signs involving the digestive system and abdomen: Secondary | ICD-10-CM | POA: Diagnosis not present

## 2013-06-15 MED ORDER — PEG-KCL-NACL-NASULF-NA ASC-C 100 G PO SOLR: 1.0000 | Freq: Once | ORAL | Status: DC

## 2013-06-15 NOTE — Progress Notes (Signed)
History of Present Illness: This is an 79-year-old female complaining of intermittent small-volume hematochezia, intermittent morning diarrhea alternating with normal stools, occasional rust colored stools, intermittent heartburn and intermittent nausea. She last underwent colonoscopy in 2009 by Dr. Magod showing a hyperplastic polyp, internal and external hemorrhoids. She previously underwent a 24-hour pH study in 2001 that did not demonstrate GERD. She underwent EGD and EUS by Dr. Jacobs in October 2011. The EGD was normal. EUS showed 3 pancreatic cysts in the head, neck and body. FNA was performed of the largest cyst in the head of the pancreas with negative findings. Dr. John recently changed her from pantoprazole to Zegerid however it appears she's taking both medications. C. diff in 01/2013 was negative.   Current Medications, Allergies, Past Medical History, Past Surgical History, Family History and Social History were reviewed in [facility] electronic medical record.   Physical Exam:  General: Well developed , well nourished, elderly, no acute distress  Head: Normocephalic and atraumatic  Eyes: sclerae anicteric, EOMI  Ears: Normal auditory acuity  Mouth: No deformity or lesions  Lungs: Clear throughout to auscultation  Heart: Regular rate and rhythm; no murmurs, rubs or bruits  Abdomen: Soft, non tender and non distended. No masses, hepatosplenomegaly or hernias noted. Normal Bowel sounds  Rectal: external hemorrhoid, 2+ heme pos stool, no lesions  Musculoskeletal: Symmetrical with no gross deformities  Pulses: Normal pulses noted  Extremities: No clubbing, cyanosis, edema or deformities noted  Neurological: Alert oriented x 4, grossly nonfocal  Psychological: Alert and cooperative. Depressed affect   Assessment and Recommendations:   1. Possible GERD. Continue Zegrid or pantoprazole daily.   2. Hematochezia, heme pos stool, change in bowel habits. Schedule colonoscopy. The  risks, benefits and alternatives to a 5 day hold of Coumadin were discussed with the patient and she consents to proceed. The risks, benefits, and alternatives to colonoscopy with possible biopsy and possible polypectomy were discussed with the patient and they consent to proceed.   

## 2013-06-15 NOTE — Telephone Encounter (Signed)
  06/15/2013    RE: [name] DOB: 04-Sep-1930 MRN: [id number]   Dear Dr. Elease Hashimoto,    We have scheduled the above patient for an endoscopic procedure. Our records show that she is on anticoagulation therapy.   Please advise as to how long the patient may come off her therapy of coumadin prior to the procedure, which is scheduled for 08/10/13.  Please fax back/ or route the completed form to [facility] at [phone].   Sincerely,  Christie Nottingham, CMA

## 2013-06-15 NOTE — Patient Instructions (Addendum)
You have been scheduled for a colonoscopy with propofol. Please follow written instructions given to you at your visit today.  Please pick up your prep kit at the pharmacy within the next 1-3 days. If you use inhalers (even only as needed), please bring them with you on the day of your procedure. Your physician has requested that you go to www.startemmi.com and enter the access code given to you at your visit today. This web site gives a general overview about your procedure. However, you should still follow specific instructions given to you by our office regarding your preparation for the procedure.  Use over the counter Preparation H cream and suppositories every day x 2 weeks.  Take either Zegerid or Pantoprazole for acid reflux.  Thank you for choosing me and [facility].  Venita Lick. Pleas Koch., MD., Clementeen Graham

## 2013-06-16 NOTE — Telephone Encounter (Signed)
Ms. [name] is on coumadin for her pulmonary emboli and is followed by [facility] for this issue

## 2013-06-17 ENCOUNTER — Telehealth: Payer: Self-pay | Admitting: General Practice

## 2013-06-17 DIAGNOSIS — M6530 Trigger finger, unspecified finger: Secondary | ICD-10-CM | POA: Diagnosis not present

## 2013-06-17 NOTE — Telephone Encounter (Signed)
Message copied by Garrison Columbus on Wed Jun 17, 2013  2:53 PM ------      Message from: Leslye Peer      Created: Wed Jun 17, 2013  2:45 PM       No - she can come off and then go back on without a bridge      Thanks,       Rob Byrum      ----- Message -----         From: Garrison Columbus, RN         Sent: 06/17/2013   8:40 AM           To: Leslye Peer, MD            Hi Dr. Delton Coombes            Patient is scheduled for endoscopy in November. She will stop coumadin 5 days prior.  Do you feel she needs to be bridged with Lovenox.            Thanks,      Bailey Mech, RN       ------

## 2013-06-17 NOTE — Telephone Encounter (Signed)
Please find out where she gets her coumadin adjusted. We can send an order to them to transition her off and then back on coumadin after the procedure. I don't think she will need enoxaparin bridge.

## 2013-06-18 NOTE — Telephone Encounter (Signed)
Patient gets coumadin adjusted at [facility] here @ [facility])

## 2013-06-25 ENCOUNTER — Ambulatory Visit: Payer: Medicare Other

## 2013-07-16 ENCOUNTER — Other Ambulatory Visit: Payer: Self-pay

## 2013-07-16 MED ORDER — PANTOPRAZOLE SODIUM 40 MG PO TBEC: 40.0000 mg | DELAYED_RELEASE_TABLET | Freq: Every day | ORAL | Status: DC

## 2013-07-16 MED ORDER — AMLODIPINE BESYLATE 5 MG PO TABS: 5.0000 mg | ORAL_TABLET | Freq: Every day | ORAL | Status: DC

## 2013-07-16 MED ORDER — LOSARTAN POTASSIUM 100 MG PO TABS: 100.0000 mg | ORAL_TABLET | Freq: Every day | ORAL | Status: DC

## 2013-07-17 MED ORDER — WARFARIN SODIUM 5 MG PO TABS: 5.0000 mg | ORAL_TABLET | Freq: Every day | ORAL | Status: DC

## 2013-07-28 ENCOUNTER — Ambulatory Visit (INDEPENDENT_AMBULATORY_CARE_PROVIDER_SITE_OTHER): Payer: Medicare Other | Admitting: Internal Medicine

## 2013-07-28 DIAGNOSIS — I2699 Other pulmonary embolism without acute cor pulmonale: Secondary | ICD-10-CM | POA: Diagnosis not present

## 2013-07-28 DIAGNOSIS — Z7901 Long term (current) use of anticoagulants: Secondary | ICD-10-CM | POA: Diagnosis not present

## 2013-07-28 LAB — POCT INR: INR: 2.4

## 2013-07-31 ENCOUNTER — Telehealth: Payer: Self-pay

## 2013-07-31 NOTE — Telephone Encounter (Signed)
Spoke with [name] advised per Dr Delton Coombes ok to hold Coumadin for upcoming procedure.  [name] verbalized understanding.

## 2013-07-31 NOTE — Telephone Encounter (Signed)
Message copied by Migdalia Dk on Fri Jul 31, 2013 11:39 AM ------      Message from: Leslye Peer      Created: Fri Jul 31, 2013  7:25 AM       Yes absolutely OK to hold for procedure. RSB      ----- Message -----         From: Luan Moore, RN         Sent: 07/28/2013   2:49 PM           To: Leslye Peer, MD            Pt seen in [facility] today.  Pt is scheduled for colonoscopy/endoscopy on 08/10/13 and has been instructed to hold Coumadin.  Please advise if ok to hold Coumadin prior to upcoming procedure.  Pt has hx of PE approx 5 years ago per pt report, also has hx of TIA after husband passed.         ------

## 2013-07-31 NOTE — Telephone Encounter (Signed)
Attempted to call pt and notify OK per Dr Delton Coombes to hold Coumadin prior to upcoming procedure.  [name] TCB.

## 2013-08-03 ENCOUNTER — Telehealth: Payer: Self-pay | Admitting: Gastroenterology

## 2013-08-03 NOTE — Telephone Encounter (Signed)
Patient wanted clarification on how many days to stop coumadin prior to her procedure on 08/10/13. Told patient to stop 5 days before procedure and it was ok to do so per Dr. Delton Coombes. Pt agreed and verbalized understanding.

## 2013-08-05 ENCOUNTER — Telehealth: Payer: Self-pay

## 2013-08-05 NOTE — Telephone Encounter (Signed)
Patient is rescheduled to [facility] for 10/19/13 8:30 due to oxygen.  Patient is aware I will send new instructions.  She is advised that she will hold her coumadin 10/14/13 for her procedure on the 12th

## 2013-08-05 NOTE — Telephone Encounter (Signed)
Message copied by Annett Fabian on Wed Aug 05, 2013 10:04 AM ------      Message from: Claudette Head T      Created: Wed Aug 05, 2013  9:02 AM      Regarding: FW: Home O2 ASA IV                   ----- Message -----         From: Cathlyn Parsons, CRNA         Sent: 08/04/2013   9:47 PM           To: Meryl Dare, MD      Subject: Home O2 ASA IV                                           Dr. Russella Dar,            Unfortunately this pt has severe bronchiectasis and use O2 at home.  Consequently she does not qualify for care at [facility] regards,            John ------

## 2013-08-07 ENCOUNTER — Telehealth: Payer: Self-pay | Admitting: Internal Medicine

## 2013-08-07 NOTE — Telephone Encounter (Signed)
08/07/2013  Pt left message stating that [facility] has not received confirmation for her meds yet.  Pt's contact # [phone]

## 2013-08-10 ENCOUNTER — Encounter: Payer: Medicare Other | Admitting: Gastroenterology

## 2013-08-13 ENCOUNTER — Ambulatory Visit (INDEPENDENT_AMBULATORY_CARE_PROVIDER_SITE_OTHER)
Admission: RE | Admit: 2013-08-13 | Discharge: 2013-08-13 | Disposition: A | Discharge status: Home / Self Care | Payer: Medicare Other | Source: Ambulatory Visit | Attending: Pulmonary Disease | Admitting: Pulmonary Disease

## 2013-08-13 ENCOUNTER — Ambulatory Visit (INDEPENDENT_AMBULATORY_CARE_PROVIDER_SITE_OTHER): Payer: Medicare Other | Admitting: Pulmonary Disease

## 2013-08-13 ENCOUNTER — Telehealth: Payer: Self-pay | Admitting: *Deleted

## 2013-08-13 ENCOUNTER — Encounter: Payer: Self-pay | Admitting: Pulmonary Disease

## 2013-08-13 ENCOUNTER — Other Ambulatory Visit: Payer: Medicare Other

## 2013-08-13 ENCOUNTER — Telehealth: Payer: Self-pay

## 2013-08-13 ENCOUNTER — Other Ambulatory Visit: Payer: Self-pay

## 2013-08-13 VITALS — BP 142/76 | HR 81 | Ht 64.0 in | Wt 159.8 lb

## 2013-08-13 DIAGNOSIS — J449 Chronic obstructive pulmonary disease, unspecified: Secondary | ICD-10-CM | POA: Diagnosis not present

## 2013-08-13 DIAGNOSIS — J471 Bronchiectasis with (acute) exacerbation: Secondary | ICD-10-CM | POA: Diagnosis not present

## 2013-08-13 DIAGNOSIS — J470 Bronchiectasis with acute lower respiratory infection: Secondary | ICD-10-CM

## 2013-08-13 DIAGNOSIS — R0789 Other chest pain: Secondary | ICD-10-CM | POA: Diagnosis not present

## 2013-08-13 MED ORDER — PREDNISONE 10 MG PO TABS: 20.0000 mg | ORAL_TABLET | Freq: Every day | ORAL | Status: DC

## 2013-08-13 MED ORDER — CIPROFLOXACIN HCL 750 MG PO TABS: 750.0000 mg | ORAL_TABLET | Freq: Two times a day (BID) | ORAL | Status: DC

## 2013-08-13 MED ORDER — CIPROFLOXACIN HCL 750 MG PO TABS: 750.0000 mg | ORAL_TABLET | Freq: Two times a day (BID) | ORAL | Status: AC

## 2013-08-13 MED ORDER — PREDNISONE 10 MG PO TABS: 10.0000 mg | ORAL_TABLET | Freq: Every day | ORAL | Status: DC

## 2013-08-13 NOTE — Telephone Encounter (Signed)
Message copied by Velvet Bathe on Thu Aug 13, 2013  3:21 PM ------      Message from: Max Fickle B      Created: Thu Aug 13, 2013  2:44 PM       L,            Please let her know that the CXR was OK            Thanks      B ------

## 2013-08-13 NOTE — Patient Instructions (Addendum)
Use the prednisone 20mg  daily for 5 days Take the Cipro 750mg  by mouth twice a day for 14 days We will call you with the results of the CXR  Use the [facility] inhaler 2 puffs twice a day until the sample runs out  We will see you back as previously scheduled or sooner if you do not improve in 7-10 days or if you get worse

## 2013-08-13 NOTE — Assessment & Plan Note (Signed)
This is most likely due to her bronchiectasis flare given the exam findings and prior similar symptoms.  We will get a CXR to make sure there is no pneumonia or pneumothorax.

## 2013-08-13 NOTE — Telephone Encounter (Signed)
Pt called because [facility] has not received prednisone rx and cipro rx from today's visit. I appologized to pt for this inconvieance. Rxs were sent to [facility]. [facility] have [facility] and cancelled the prednisone with Lia at [facility].  However, they have not yet received the cipro rx.  We will have to call back in the am to f/u on this.  The above was taken care of by Gweneth Dimitri, RN  [facility] Wolfert called [facility] and was advised the pred and cipro rxs were received.

## 2013-08-13 NOTE — Telephone Encounter (Signed)
[name] to let pt know that the cxr was normal. [name] L

## 2013-08-13 NOTE — Addendum Note (Signed)
Addended by: Gwynneth Aliment A on: 08/13/2013 05:13 PM   Modules accepted: Orders

## 2013-08-13 NOTE — Progress Notes (Deleted)
Subjective:    Patient ID: [name], female    DOB: September 09, 1930   MRN: [id number]  HPI  89 yowf never smoker, allergic rhinitis, hx bilat PE in September 2008,with asociated Pulm HTN (PASP 70 with intact LV fxn). Started on Spiriva after PFT's showed mild to mod AFL without BD response; she stopped this due to dry eyes, L eye pain.  12/20/11 self referred to [facility] for eval of sob.  12/20/11  ov/ [name]  cc sob x 2 months gradual onset progressive to point where has trouble vacuuming or scrubbing the floors better when wears 02 as was told to do.  Does not wear 02 at hs. Says only has" large tank"  at home. "No better"  with abx though acutally was assoc with cough that was productive of green now white mostly in am but then still cough all day long.  rec Bronchiectasis =   you have scarring of your bronchial tubes which means that they don't function perfectly normally and mucus tends to pool in certain areas of your lung which can cause pneumonia and further scarring of your lung and bronchial tubes Whenever you develop cough congestion take mucinex or mucinex dm > these will help keep the mucus loose and flowing but if your condition worsens you need to seek help immediately preferably here or somewhere inside the [facility] system to compare xrays ( worse = darker or bloody mucus or pain on breathing in)  Protonix (pantoprozole) 40 mg Take 30-60 min before first meal of the day  GERD (REFLUX)   01/24/2012 f/u ov/[name] cc Prod cough (Green ) for 3 weeks off and on - Sinus drainage - Head congestion - Soreness in L face - SOB on exertion but not really limiting desired activities.  Acute OV 10/'13 --  Complains of head congestion w/ PND, prod cough with green mucus, occ wheezing, occ chills x2-3weeks. Cough is getting worse.  Mucinex is not helping.  No hemoptysis or chest pain. No fever   ROV 01/22/13 -- 79 yo woman, hx B PE on coumadin, bronchiectasis, moderate AFL on spiro, allergic rhinitis,  GERD. She is maintained on protonix bid, nasonex spray, loratadine. She has had more nasal congestion, more cough and wheeze. She is coughing up green phlegm.   ROV 04/28/13 -- 79 yo woman, B PE on coumadin, bronchiectasis, moderate AFL on spiro, allergic rhinitis, GERD. She reports good mucous production in the am, dark yellow.  Breathing is good, good exercise tolerance. No flares since last time.    08/13/2013 ROV >> [name] notes sinus trouble for a week with ear pain. She has been taking claritin and her inhaler.  She has been having some left side pain for the last few days. She notes a heaviness in her left chest radiating around to her back. She has been producting green sputum and yellow mucus.  It has gotten darker i nthe last few weeks.  It is a little harder to breath.  No fever or chills. She has felt the heaviness with flares of bronchitis in the past, but perhaps this is a little worse.      Objective:   Physical Exam Filed Vitals:   08/13/13 1335  BP: 142/76  Pulse: 81  Height: 5\' 4"  (1.626 m)  Weight: 159 lb 12.8 oz (72.485 kg)  SpO2: 91%  RA  Gen: Pleasant, well-nourished, in no distress,  normal affect  ENT: No lesions,  mouth clear,  oropharynx clear, no postnasal drip  Neck: No JVD, no TMG, no carotid bruits  Lungs: No use of accessory muscles, no dullness to percussion, clear without rales or rhonchi  Cardiovascular: RRR, heart sounds normal, no murmur or gallops, no peripheral edema  Abdomen: soft and NT, no HSM,  BS normal  Musculoskeletal: No deformities, no cyanosis or clubbing  Neuro: alert, non focal  Skin: Warm, no lesions or rashes    12/07/11 CT 1. Waxing and waning reticulonodular interstitial opacities in the  lungs over the last 5 years, with tree in bud morphology suggesting  atypical infectious bronchiolitis and airway plugging.  2. Bronchiectasis and progressive atelectasis in the right middle  lobe of the last 5 years, likely secondary to  recurrent infection.  Given the overall volume loss, I am doubtful of malignancy in this  vicinity, and active pneumonia is not observed.  3. Mosaic attenuation with reduced conspicuity of vessels in the  more lucent areas of lungs, favoring either chronic pulmonary  embolus or obstructive bronchiolitis.  4. Coronary artery atherosclerosis       Assessment & Plan:    Bronchiectasis with acute exacerbation She is experiencing a bronchiectasis flare.  Plan: -CXR b/c of chest pain -flutter valve qid -saline rinses sinuses at least tid -prednisone 5 days -cipro 750mg  po bid x 14 days -sputum culture -trial Dulera 100/4.5 2 puff bid -f/u with Dr. Delton Coombes as scheduled    Updated Medication List Outpatient Encounter Prescriptions as of 08/13/2013  Medication Sig  . amLODipine (NORVASC) 5 MG tablet Take 1 tablet (5 mg total) by mouth daily.  [name] aspirin 81 MG EC tablet Take 81 mg by mouth daily.    . calcium carbonate (TUMS) 500 MG chewable tablet Chew 1 tablet by mouth daily as needed.    . Digestive Enzymes (PAPAYA AND ENZYMES) CHEW Chew by mouth. Chew 2 tablets with meals as needed   . loratadine (CLARITIN) 10 MG tablet Take 10 mg by mouth daily.    [name] losartan (COZAAR) 100 MG tablet Take 1 tablet (100 mg total) by mouth daily.  . meclizine (ANTIVERT) 12.5 MG tablet Take 1 tablet (12.5 mg total) by mouth 3 (three) times daily as needed.  . mometasone (NASONEX) 50 MCG/ACT nasal spray Place 2 sprays into the nose daily as needed.   . Multiple Vitamins-Minerals (MACUVITE) TABS Take 1 tablet by mouth daily.    . pantoprazole (PROTONIX) 40 MG tablet Take 1 tablet (40 mg total) by mouth daily.  [name] PROAIR HFA 108 (90 BASE) MCG/ACT inhaler INHALE 2 PUFFS INTO THE LUNGS EVERY 6 (SIX) HOURS AS NEEDED FOR WHEEZING.  . Probiotic Product (PROBIOTIC DAILY PO) Take 1 tablet by mouth daily.  [name] Respiratory Therapy Supplies (FLUTTER) DEVI 1 Device by Does not apply route as needed.  . warfarin  (COUMADIN) 5 MG tablet Take 1 tablet (5 mg total) by mouth daily.  . peg 3350 powder (MOVIPREP) 100 G SOLR Take 1 kit (200 g total) by mouth once.       Subjective:    Patient ID: Sharol Given, female    DOB: Feb 13, 1930, 79 y.o.   MRN: [id number]  HPI    Review of Systems     Objective:   Physical Exam        Assessment & Plan:    Subjective:    Patient ID: JAANA BRODT, female    DOB: 03-Feb-1930   MRN: [id number]  HPI  45 yowf never smoker, allergic rhinitis, hx bilat PE in September 2008,with  asociated Pulm HTN (PASP 70 with intact LV fxn). Started on Spiriva after PFT's showed mild to mod AFL without BD response; she stopped this due to dry eyes, L eye pain.  12/20/11 self referred to [facility] for eval of sob.  12/20/11  ov/ [name]  cc sob x 2 months gradual onset progressive to point where has trouble vacuuming or scrubbing the floors better when wears 02 as was told to do.  Does not wear 02 at hs. Says only has" large tank"  at home. "No better"  with abx though acutally was assoc with cough that was productive of green now white mostly in am but then still cough all day long.  rec Bronchiectasis =   you have scarring of your bronchial tubes which means that they don't function perfectly normally and mucus tends to pool in certain areas of your lung which can cause pneumonia and further scarring of your lung and bronchial tubes Whenever you develop cough congestion take mucinex or mucinex dm > these will help keep the mucus loose and flowing but if your condition worsens you need to seek help immediately preferably here or somewhere inside the [facility] system to compare xrays ( worse = darker or bloody mucus or pain on breathing in)  Protonix (pantoprozole) 40 mg Take 30-60 min before first meal of the day  GERD (REFLUX)   01/24/2012 f/u ov/[name] cc Prod cough (Green ) for 3 weeks off and on - Sinus drainage - Head congestion - Soreness in L face - SOB on exertion but not really  limiting desired activities.  Acute OV 10/'13 --  Complains of head congestion w/ PND, prod cough with green mucus, occ wheezing, occ chills x2-3weeks. Cough is getting worse.  Mucinex is not helping.  No hemoptysis or chest pain. No fever   ROV 01/22/13 -- 79 yo woman, hx B PE on coumadin, bronchiectasis, moderate AFL on spiro, allergic rhinitis, GERD. She is maintained on protonix bid, nasonex spray, loratadine. She has had more nasal congestion, more cough and wheeze. She is coughing up green phlegm.   ROV 04/28/13 -- 79 yo woman, B PE on coumadin, bronchiectasis, moderate AFL on spiro, allergic rhinitis, GERD. She reports good mucous production in the am, dark yellow.  Breathing is good, good exercise tolerance. No flares since last time.        Objective:   Physical Exam Filed Vitals:   08/13/13 1335  BP: 142/76  Pulse: 81  Gen: Pleasant, well-nourished, in no distress,  normal affect  ENT: No lesions,  mouth clear,  oropharynx clear, no postnasal drip  Neck: No JVD, no TMG, no carotid bruits  Lungs: No use of accessory muscles, no dullness to percussion, clear without rales or rhonchi  Cardiovascular: RRR, heart sounds normal, no murmur or gallops, no peripheral edema  Abdomen: soft and NT, no HSM,  BS normal  Musculoskeletal: No deformities, no cyanosis or clubbing  Neuro: alert, non focal  Skin: Warm, no lesions or rashes    12/07/11 CT 1. Waxing and waning reticulonodular interstitial opacities in the  lungs over the last 5 years, with tree in bud morphology suggesting  atypical infectious bronchiolitis and airway plugging.  2. Bronchiectasis and progressive atelectasis in the right middle  lobe of the last 5 years, likely secondary to recurrent infection.  Given the overall volume loss, I am doubtful of malignancy in this  vicinity, and active pneumonia is not observed.  3. Mosaic attenuation with reduced conspicuity of vessels  in the  more lucent areas of lungs,  favoring either chronic pulmonary  embolus or obstructive bronchiolitis.  4. Coronary artery atherosclerosis       Assessment & Plan:  Bronchiectasis with acute exacerbation She is experiencing a bronchiectasis flare.  Plan: -CXR b/c of chest pain -flutter valve qid -saline rinses sinuses at least tid -prednisone 5 days -cipro 750mg  po bid x 14 days -sputum culture -trial Dulera 100/4.5 2 puff bid -f/u with Dr. Delton Coombes as scheduled

## 2013-08-13 NOTE — Progress Notes (Signed)
Subjective:    Patient ID: [name], female    DOB: 1930/09/29, 79 y.o.   MRN: [id number]  Synopsis: [name] patient with bronchiectais and secondary pulmonary hypertension.  HPI   08/13/2013 ROV >> [name] notes sinus trouble for a week with ear pain. She has been taking claritin and her inhaler.  She has been having some left side pain for the last few days. She notes a heaviness in her left chest radiating around to her back. She has been producting green sputum and yellow mucus.  It has gotten darker i nthe last few weeks.  It is a little harder to breath.  No fever or chills. She has felt the heaviness with flares of bronchitis in the past, but perhaps this is a little worse.    Past Medical History  Diagnosis Date  . HYPERLIPIDEMIA   . ANXIETY   . DEPRESSION   . COMMON MIGRAINE   . Unspecified hearing loss   . HYPERTENSION   . CORONARY ARTERY DISEASE   . PULMONARY HYPERTENSION, SECONDARY   . CAROTID ARTERY STENOSIS, RIGHT   . Chronic rhinitis   . ALLERGIC RHINITIS   . COPD   . GERD   . DIVERTICULOSIS, COLON   . CYST/PSEUDOCYST, PANCREAS   . OVERACTIVE BLADDER   . OSTEOPOROSIS   . DIZZINESS, CHRONIC   . Pulmonary embolism 2008  . Bronchiectasis 12/11/2011    CT chest dx  . Esophageal stricture      Review of Systems  Constitutional: Positive for fatigue. Negative for fever and chills.  HENT: Positive for congestion, postnasal drip, rhinorrhea and sinus pressure.   Respiratory: Positive for cough, chest tightness, shortness of breath and wheezing.   Cardiovascular: Positive for chest pain. Negative for palpitations and leg swelling.       Objective:   Physical Exam Filed Vitals:   08/13/13 1335  BP: 142/76  Pulse: 81  Height: 5\' 4"  (1.626 m)  Weight: 159 lb 12.8 oz (72.485 kg)  SpO2: 91%    Gen: no acute distress HEENT: NCAT, OP clear PULM: Wheezing Left lower lobe, air entry equal bilaterally CV: RRR, no mgr AB: BS+, soft, nontender Ext: warm,  acyanotic Derm: no rash Neuro: A&Ox4, maew        Assessment & Plan:   Bronchiectasis with acute exacerbation She is experiencing a bronchiectasis flare.  Plan: -CXR b/c of chest pain -flutter valve qid -saline rinses sinuses at least tid -prednisone 5 days -cipro 750mg  po bid x 14 days -sputum culture -trial Dulera 100/4.5 2 puff bid -f/u with Dr. Delton Coombes as scheduled  Chest discomfort This is most likely due to her bronchiectasis flare Aguirre the exam findings and prior similar symptoms.  We will get a CXR to make sure there is no pneumonia or pneumothorax.    Updated Medication List Outpatient Encounter Prescriptions as of 08/13/2013  Medication Sig  . amLODipine (NORVASC) 5 MG tablet Take 1 tablet (5 mg total) by mouth daily.  [name] aspirin 81 MG EC tablet Take 81 mg by mouth daily.    . calcium carbonate (TUMS) 500 MG chewable tablet Chew 1 tablet by mouth daily as needed.    . Digestive Enzymes (PAPAYA AND ENZYMES) CHEW Chew by mouth. Chew 2 tablets with meals as needed   . loratadine (CLARITIN) 10 MG tablet Take 10 mg by mouth daily.    [name] losartan (COZAAR) 100 MG tablet Take 1 tablet (100 mg total) by mouth daily.  . meclizine (ANTIVERT)  12.5 MG tablet Take 1 tablet (12.5 mg total) by mouth 3 (three) times daily as needed.  . mometasone (NASONEX) 50 MCG/ACT nasal spray Place 2 sprays into the nose daily as needed.   . Multiple Vitamins-Minerals (MACUVITE) TABS Take 1 tablet by mouth daily.    . pantoprazole (PROTONIX) 40 MG tablet Take 1 tablet (40 mg total) by mouth daily.  [name] PROAIR HFA 108 (90 BASE) MCG/ACT inhaler INHALE 2 PUFFS INTO THE LUNGS EVERY 6 (SIX) HOURS AS NEEDED FOR WHEEZING.  . Probiotic Product (PROBIOTIC DAILY PO) Take 1 tablet by mouth daily.  [name] Respiratory Therapy Supplies (FLUTTER) DEVI 1 Device by Does not apply route as needed.  . warfarin (COUMADIN) 5 MG tablet Take 1 tablet (5 mg total) by mouth daily.  . ciprofloxacin (CIPRO) 750 MG tablet Take 1  tablet (750 mg total) by mouth 2 (two) times daily.  . peg 3350 powder (MOVIPREP) 100 G SOLR Take 1 kit (200 g total) by mouth once.  . predniSONE (DELTASONE) 10 MG tablet Take 2 tablets (20 mg total) by mouth daily with breakfast.  . [DISCONTINUED] ciprofloxacin (CIPRO) 750 MG tablet Take 1 tablet (750 mg total) by mouth 2 (two) times daily.  . [DISCONTINUED] predniSONE (DELTASONE) 10 MG tablet Take 1 tablet (10 mg total) by mouth daily with breakfast.

## 2013-08-13 NOTE — Assessment & Plan Note (Addendum)
She is experiencing a bronchiectasis flare.  Plan: -CXR b/c of chest pain -flutter valve qid -saline rinses sinuses at least tid -prednisone 5 days -cipro 750mg  po bid x 14 days -sputum culture -trial Dulera 100/4.5 2 puff bid -f/u with Dr. Delton Coombes as scheduled

## 2013-08-14 NOTE — Telephone Encounter (Signed)
Called [facility] back, spoke with Misty Stanley.   Was advised they still have not received the cipro rx.   However, they do have a note in pt's chart that cipro rx was sent in error and to disregard it if rx does come through.  Per Misty Stanley, nothing further is needed given this note is in pt's chart.  Was advised, if for some reason, this does slip through and pt receives the cipro from them, she would be able to return it and get money back.  Per Misty Stanley, nothing further is needed from our office on this.    [facility]ed [facility], spoke with Huntley Dec.  Was advised they did receive the cipro 750 mg rx and the pred rx from yesterday.  Pt has already picked both rxs up.  Therefore, will sign off on msg.

## 2013-08-17 ENCOUNTER — Telehealth: Payer: Self-pay | Admitting: Pulmonary Disease

## 2013-08-17 MED ORDER — AZITHROMYCIN 250 MG PO TABS: ORAL_TABLET | ORAL | Status: DC

## 2013-08-17 NOTE — Telephone Encounter (Signed)
Last visit 08-13-13, Dr, [name] patient. Pt was prescribed Cipro x 14 days. She took it x 2 days and stopped due to headache, and severe nausea. Cipro is already on teh pt med list with side effect of dizziness. Pt states she stopped the medication on 08-15-13. Please advise in an alternative. [name], CMA Allergies  Allergen Reactions  . Amoxicillin-Pot Clavulanate     REACTION: diarrhea  . Ciclesonide     REACTION: bad smell  . Ciprofloxacin Other (See Comments)    dizziness  . Raloxifene     REACTION: risk of recurrent stroke  . Sulfonamide Derivatives     REACTION: tongue swells

## 2013-08-17 NOTE — Telephone Encounter (Signed)
z-pak 

## 2013-08-17 NOTE — Telephone Encounter (Signed)
Rx has been sent in. Pt is aware. 

## 2013-08-18 ENCOUNTER — Ambulatory Visit (INDEPENDENT_AMBULATORY_CARE_PROVIDER_SITE_OTHER): Payer: Medicare Other | Admitting: General Practice

## 2013-08-18 DIAGNOSIS — Z7901 Long term (current) use of anticoagulants: Secondary | ICD-10-CM | POA: Diagnosis not present

## 2013-08-18 DIAGNOSIS — I2699 Other pulmonary embolism without acute cor pulmonale: Secondary | ICD-10-CM

## 2013-08-18 NOTE — Progress Notes (Signed)
Pre-visit discussion using our clinic review tool. No additional management support is needed unless otherwise documented below in the visit note.  

## 2013-08-19 LAB — LOWER RESPIRATORY CULTURE

## 2013-08-21 ENCOUNTER — Telehealth: Payer: Self-pay | Admitting: Emergency Medicine

## 2013-08-21 MED ORDER — LEVOFLOXACIN 500 MG PO TABS: 500.0000 mg | ORAL_TABLET | Freq: Every day | ORAL | Status: DC

## 2013-08-21 NOTE — Telephone Encounter (Signed)
levaquin 500 mg daily x 7 days 

## 2013-08-21 NOTE — Telephone Encounter (Signed)
Pt aware of recs and rx has been sent 

## 2013-08-21 NOTE — Telephone Encounter (Signed)
RB pt.  Was seen by BQ as sick visit on 11/6.  Cipro rx was given, but pt stopped this d/t side effected.  ZPak rx was given by Dr. Sherene Sires on 11/10 as an alternative.    Called, spoke with pt.  Reports she finished the zpak yesterday.  Still not getting better.  Feels weak.  Has prod cough with dark yellow to green mucus, sinus pressure, HA, and PND.  No f/c/s.  States levaquin has worked well in the past - requesting rx to be sent to [facility].  As Dr. Delton Coombes is listed off and BQ is not on the schedule, will send msg to Dr. Sherene Sires as he last advised on abx change.  Dr. Sherene Sires, pls advise.  Thank you.

## 2013-08-27 ENCOUNTER — Ambulatory Visit: Payer: Medicare Other | Admitting: Internal Medicine

## 2013-08-28 ENCOUNTER — Encounter: Payer: Self-pay | Admitting: Internal Medicine

## 2013-08-28 ENCOUNTER — Ambulatory Visit (INDEPENDENT_AMBULATORY_CARE_PROVIDER_SITE_OTHER): Payer: Medicare Other | Admitting: Internal Medicine

## 2013-08-28 ENCOUNTER — Other Ambulatory Visit (INDEPENDENT_AMBULATORY_CARE_PROVIDER_SITE_OTHER): Payer: Medicare Other

## 2013-08-28 VITALS — BP 142/80 | HR 76 | Temp 97.8°F | Ht 64.5 in | Wt 154.0 lb

## 2013-08-28 DIAGNOSIS — F329 Major depressive disorder, single episode, unspecified: Secondary | ICD-10-CM | POA: Diagnosis not present

## 2013-08-28 DIAGNOSIS — R350 Frequency of micturition: Secondary | ICD-10-CM | POA: Insufficient documentation

## 2013-08-28 DIAGNOSIS — R7309 Other abnormal glucose: Secondary | ICD-10-CM

## 2013-08-28 DIAGNOSIS — R7302 Impaired glucose tolerance (oral): Secondary | ICD-10-CM

## 2013-08-28 DIAGNOSIS — I10 Essential (primary) hypertension: Secondary | ICD-10-CM | POA: Diagnosis not present

## 2013-08-28 DIAGNOSIS — Z23 Encounter for immunization: Secondary | ICD-10-CM | POA: Diagnosis not present

## 2013-08-28 DIAGNOSIS — F3289 Other specified depressive episodes: Secondary | ICD-10-CM

## 2013-08-28 LAB — URINALYSIS, ROUTINE W REFLEX MICROSCOPIC
Leukocytes, UA: NEGATIVE
Nitrite: NEGATIVE
Specific Gravity, Urine: >=1.03 (ref 1.000–1.030)
Total Protein, Urine: NEGATIVE
pH: 5.5 (ref 5.0–8.0)

## 2013-08-28 NOTE — Progress Notes (Addendum)
Subjective:    Patient ID: [name], female    DOB: 1930-03-24, 79 y.o.   MRN: [id number]  HPI  Here to f/u; overall doing ok,  Pt denies chest pain, increased sob or doe, wheezing, orthopnea, PND, increased LE swelling, palpitations, dizziness or syncope.  Pt denies polydipsia, polyuria, or low sugar symptoms such as weakness or confusion improved with po intake.  Pt denies new neurological symptoms such as new headache, or facial or extremity weakness or numbness.   Pt states overall good compliance with meds, has been trying to follow lower cholesterol diet, with wt overall stable,  but little exercise however.  For colonoscopy per Dr Mignon Pine 12. Overall improved bleeding, hemorrhoids less active recently. No fever.  Has 2 lbs wt loss since aug 2014. BP stable. No other overt bleeding or bruising. No other acute complaints  Denies worsening depressive symptoms, suicidal ideation, or panic.  Has had mild urinary freq, but Denies urinary symptoms such as dysuria, frequency, urgency, flank pain, hematuria or n/v, fever, chills. Past Medical History  Diagnosis Date  . HYPERLIPIDEMIA   . ANXIETY   . DEPRESSION   . COMMON MIGRAINE   . Unspecified hearing loss   . HYPERTENSION   . CORONARY ARTERY DISEASE   . PULMONARY HYPERTENSION, SECONDARY   . CAROTID ARTERY STENOSIS, RIGHT   . Chronic rhinitis   . ALLERGIC RHINITIS   . COPD   . GERD   . DIVERTICULOSIS, COLON   . CYST/PSEUDOCYST, PANCREAS   . OVERACTIVE BLADDER   . OSTEOPOROSIS   . DIZZINESS, CHRONIC   . Pulmonary embolism 2008  . Bronchiectasis 12/11/2011    CT chest dx  . Esophageal stricture    Past Surgical History  Procedure Laterality Date  . Abdominal hysterectomy  1965  . Egd  1999  . Cholecystectomy  1963  . Tonsillectomy    . Appendectomy  1963  . S/p sinus surgury    . Pancreatic cyst drainage      x 2    reports that she has never smoked. She has never used smokeless tobacco. She reports that she does not  drink alcohol or use illicit drugs. family history includes Breast cancer in her daughter; Emphysema in her father; Heart disease in her mother; Lymphoma in her sister; Stroke in her father. Allergies  Allergen Reactions  . Amoxicillin-Pot Clavulanate     REACTION: diarrhea  . Ciclesonide     REACTION: bad smell  . Ciprofloxacin Other (See Comments)    dizziness  . Raloxifene     REACTION: risk of recurrent stroke  . Sulfonamide Derivatives     REACTION: tongue swells   Current Outpatient Prescriptions on File Prior to Visit  Medication Sig Dispense Refill  . amLODipine (NORVASC) 5 MG tablet Take 1 tablet (5 mg total) by mouth daily.  90 tablet  3  . aspirin 81 MG EC tablet Take 81 mg by mouth daily.        . calcium carbonate (TUMS) 500 MG chewable tablet Chew 1 tablet by mouth daily as needed.        . Digestive Enzymes (PAPAYA AND ENZYMES) CHEW Chew by mouth. Chew 2 tablets with meals as needed       . loratadine (CLARITIN) 10 MG tablet Take 10 mg by mouth daily.        [name] losartan (COZAAR) 100 MG tablet Take 1 tablet (100 mg total) by mouth daily.  90 tablet  3  .  meclizine (ANTIVERT) 12.5 MG tablet Take 1 tablet (12.5 mg total) by mouth 3 (three) times daily as needed.  50 tablet  4  . mometasone (NASONEX) 50 MCG/ACT nasal spray Place 2 sprays into the nose daily as needed.       . Multiple Vitamins-Minerals (MACUVITE) TABS Take 1 tablet by mouth daily.        . pantoprazole (PROTONIX) 40 MG tablet Take 1 tablet (40 mg total) by mouth daily.  90 tablet  3  . peg 3350 powder (MOVIPREP) 100 G SOLR Take 1 kit (200 g total) by mouth once.  1 kit  0  . predniSONE (DELTASONE) 10 MG tablet Take 2 tablets (20 mg total) by mouth daily with breakfast.  10 tablet  0  . PROAIR HFA 108 (90 BASE) MCG/ACT inhaler INHALE 2 PUFFS INTO THE LUNGS EVERY 6 (SIX) HOURS AS NEEDED FOR WHEEZING.  8.5 each  3  . Probiotic Product (PROBIOTIC DAILY PO) Take 1 tablet by mouth daily.      [name] Respiratory Therapy  Supplies (FLUTTER) DEVI 1 Device by Does not apply route as needed.  1 each  0  . warfarin (COUMADIN) 5 MG tablet Take 1 tablet (5 mg total) by mouth daily.  30 tablet  3   No current facility-administered medications on file prior to visit.     Review of Systems  Constitutional: Negative for unexpected weight change, or unusual diaphoresis  HENT: Negative for tinnitus.   Eyes: Negative for photophobia and visual disturbance.  Respiratory: Negative for choking and stridor.   Gastrointestinal: Negative for vomiting and blood in stool.  Genitourinary: Negative for hematuria and decreased urine volume.  Musculoskeletal: Negative for acute joint swelling Skin: Negative for color change and wound.  Neurological: Negative for tremors and numbness other than noted  Psychiatric/Behavioral: Negative for decreased concentration or  hyperactivity.       Objective:   Physical Exam BP 142/80  Pulse 76  Temp(Src) 97.8 F (36.6 C) (Oral)  Ht 5' 4.5" (1.638 m)  Wt 154 lb (69.854 kg)  BMI 26.04 kg/m2  SpO2 92% VS noted,  Constitutional: Pt appears well-developed and well-nourished.  HENT: Head: NCAT.  Right Ear: External ear normal.  Left Ear: External ear normal.  Eyes: Conjunctivae and EOM are normal. Pupils are equal, round, and reactive to light.  Neck: Normal range of motion. Neck supple.  Cardiovascular: Normal rate and regular rhythm.   Pulmonary/Chest: Effort normal and breath sounds decreased bilat.  Abd:  Soft, NT, non-distended, + BS Neurological: Pt is alert. Not confused  Skin: Skin is warm. No erythema.  Psychiatric: Pt behavior is normal. Thought content normal. mild nervous, not depressed affect    Assessment & Plan:  Quality Measures addressed:   CAD  - drug therapy for lower cholesterol: pt declines further medication

## 2013-08-28 NOTE — Progress Notes (Signed)
Pre-visit discussion using our clinic review tool. No additional management support is needed unless otherwise documented below in the visit note.  

## 2013-08-28 NOTE — Patient Instructions (Signed)
You had the new Prevnar pneumonia shot today Please go to the LAB in the Basement (turn left off the elevator) for the tests to be done today - just the urine testing today Please continue all other medications as before, and refills have been done if requested. Please have the pharmacy call with any other refills you may need.  Please keep your appointments with your specialists as you have planned - Dr Russella Dar  Please return in 3 months, or sooner if needed

## 2013-08-29 NOTE — Assessment & Plan Note (Signed)
Mild, ? Clinical significance - for UA

## 2013-08-29 NOTE — Assessment & Plan Note (Signed)
stable overall by history and exam, recent data reviewed with pt, and pt to continue medical treatment as before,  to f/u any worsening symptoms or concerns Lab Results  Component Value Date   WBC 10.0 05/26/2013   HGB 14.0 05/26/2013   HCT 41.1 05/26/2013   PLT 243.0 05/26/2013   GLUCOSE 106* 05/26/2013   CHOL 211* 11/10/2012   TRIG 147.0 11/10/2012   HDL 58.40 11/10/2012   LDLDIRECT 122.7 11/10/2012   ALT 22 05/26/2013   AST 26 05/26/2013   NA 134* 05/26/2013   K 3.6 05/26/2013   CL 102 05/26/2013   CREATININE 0.6 05/26/2013   BUN 11 05/26/2013   CO2 28 05/26/2013   TSH 3.23 11/10/2012   INR 1.5 08/18/2013   HGBA1C 5.8 05/26/2013

## 2013-08-29 NOTE — Assessment & Plan Note (Signed)
stable overall by history and exam, recent data reviewed with pt, and pt to continue medical treatment as before,  to f/u any worsening symptoms or concerns BP Readings from Last 3 Encounters:  08/28/13 142/80  08/13/13 142/76  06/15/13 144/62

## 2013-08-29 NOTE — Assessment & Plan Note (Signed)
stable overall by history and exam, recent data reviewed with pt, and pt to continue medical treatment as before,  to f/u any worsening symptoms or concerns Lab Results  Component Value Date   HGBA1C 5.8 05/26/2013    

## 2013-09-15 ENCOUNTER — Ambulatory Visit (INDEPENDENT_AMBULATORY_CARE_PROVIDER_SITE_OTHER): Payer: Medicare Other | Admitting: General Practice

## 2013-09-15 DIAGNOSIS — I2699 Other pulmonary embolism without acute cor pulmonale: Secondary | ICD-10-CM

## 2013-09-15 DIAGNOSIS — Z7901 Long term (current) use of anticoagulants: Secondary | ICD-10-CM | POA: Diagnosis not present

## 2013-09-15 LAB — POCT INR: INR: 2.7

## 2013-09-15 NOTE — Progress Notes (Signed)
Pre-visit discussion using our clinic review tool. No additional management support is needed unless otherwise documented below in the visit note.  

## 2013-09-15 NOTE — Patient Instructions (Addendum)
Instructions for coumadin pre and post colonoscopy.  1/7 - Take last dose of coumadin until after procedure 1/12 - Procedure 1/13 - Take 5 mg of coumadin 1/14 - Take 5 mg of coumadin 1/15 - Take 2.5 mg of coumadin 1/16 - Take 2.5 mg of coumadin 1/17 - Take 2.5 mg 1/18 - Take 2.5 mg 1/19 - Take 5 mg Re-check on 1/20  Patient verbalized understanding.

## 2013-09-17 ENCOUNTER — Encounter: Payer: Self-pay | Admitting: Emergency Medicine

## 2013-09-17 ENCOUNTER — Ambulatory Visit (INDEPENDENT_AMBULATORY_CARE_PROVIDER_SITE_OTHER): Payer: Medicare Other | Admitting: Emergency Medicine

## 2013-09-17 VITALS — BP 140/80 | HR 81 | Ht 65.0 in | Wt 158.4 lb

## 2013-09-17 DIAGNOSIS — R0602 Shortness of breath: Secondary | ICD-10-CM

## 2013-09-17 DIAGNOSIS — J479 Bronchiectasis, uncomplicated: Secondary | ICD-10-CM | POA: Diagnosis not present

## 2013-09-17 NOTE — Patient Instructions (Addendum)
Continue loratadine, nasal saline irrigation, nasonex Use protonix as needed  Continue ProAir as needed We will get you more portable oxygen tanks for activity Follow with Dr Delton Coombes in 2 months or sooner if you have any problems

## 2013-09-17 NOTE — Assessment & Plan Note (Signed)
With recent flare. Still w cough but appears to be more UA irritation.  - continue allergy routine - restart protonix - get more portable o2 tanks - rov 2 mon

## 2013-09-17 NOTE — Progress Notes (Signed)
Subjective:    Patient ID: [name], female    DOB: 12-18-1929   MRN: [id number]  HPI 48 yowf never smoker, allergic rhinitis, hx bilat PE in September 2008,with asociated Pulm HTN (PASP 70 with intact LV fxn). Started on Spiriva after PFT's showed mild to mod AFL without BD response; she stopped this due to dry eyes, L eye pain.  12/20/11 self referred to [facility] for eval of sob.  12/20/11  ov/ [name]  cc sob x 2 months gradual onset progressive to point where has trouble vacuuming or scrubbing the floors better when wears 02 as was told to do.  Does not wear 02 at hs. Says only has" large tank"  at home. "No better"  with abx though acutally was assoc with cough that was productive of green now white mostly in am but then still cough all day long.  rec Bronchiectasis =   you have scarring of your bronchial tubes which means that they don't function perfectly normally and mucus tends to pool in certain areas of your lung which can cause pneumonia and further scarring of your lung and bronchial tubes Whenever you develop cough congestion take mucinex or mucinex dm > these will help keep the mucus loose and flowing but if your condition worsens you need to seek help immediately preferably here or somewhere inside the [facility] system to compare xrays ( worse = darker or bloody mucus or pain on breathing in)  Protonix (pantoprozole) 40 mg Take 30-60 min before first meal of the day  GERD (REFLUX)   01/24/2012 f/u ov/[name] cc Prod cough (Green ) for 3 weeks off and on - Sinus drainage - Head congestion - Soreness in L face - SOB on exertion but not really limiting desired activities.  Acute OV 10/'13 --  Complains of head congestion w/ PND, prod cough with green mucus, occ wheezing, occ chills x2-3weeks. Cough is getting worse.  Mucinex is not helping.  No hemoptysis or chest pain. No fever   ROV 01/22/13 -- 79 yo woman, hx B PE on coumadin, bronchiectasis, moderate AFL on spiro, allergic rhinitis, GERD.  She is maintained on protonix bid, nasonex spray, loratadine. She has had more nasal congestion, more cough and wheeze. She is coughing up green phlegm.   ROV 04/28/13 -- 79 yo woman, B PE on coumadin, bronchiectasis, moderate AFL on spiro, allergic rhinitis, GERD. She reports good mucous production in the am, dark yellow.  Breathing is good, good exercise tolerance. No flares since last time.    08/13/2013 ROV >> [name] notes sinus trouble for a week with ear pain. She has been taking claritin and her inhaler.  She has been having some left side pain for the last few days. She notes a heaviness in her left chest radiating around to her back. She has been producting green sputum and yellow mucus.  It has gotten darker i nthe last few weeks.  It is a little harder to breath.  No fever or chills. She has felt the heaviness with flares of bronchitis in the past, but perhaps this is a little worse  ROV 09/17/13 -- Hx B PE on coumadin, bronchiectasis, moderate AFL on spiro, allergic rhinitis, GERD. Was desaturated on arrival today > doesn't have her O2 with her due to large heavy tanks. She has daily cough, brings up sputum reliably daily. Last few days she has had more congestion, more dry cough. Saw Dr BQ a month ago with an AE-bronchiectasis. She was  unable to tolerate cipro, was treated w azithro and then levaquin.        Objective:   Physical Exam Filed Vitals:   09/17/13 1418  BP: 140/80  Pulse: 81  Height: 5\' 5"  (1.651 m)  Weight: 158 lb 6.4 oz (71.85 kg)  SpO2: 92%   Gen: Pleasant, well-nourished, in no distress,  normal affect  ENT: No lesions,  mouth clear,  oropharynx clear, no postnasal drip  Neck: No JVD, no TMG, no carotid bruits  Lungs: No use of accessory muscles, no dullness to percussion, clear without rales or rhonchi  Cardiovascular: RRR, heart sounds normal, no murmur or gallops, no peripheral edema  Abdomen: soft and NT, no HSM,  BS normal  Musculoskeletal: No  deformities, no cyanosis or clubbing  Neuro: alert, non focal  Skin: Warm, no lesions or rashes   CXR 08/13/13 --  COMPARISON: November 10, 2012.  FINDINGS:  The lungs are hyperinflated with hemidiaphragm flattening. There is  no focal infiltrate. There is mild hilar prominence on the right  which is stable. Coarse stable right infrahilar lung markings are  present which may be related to known bronchiectasis. The cardiac  silhouette is normal in size. The pulmonary vascularity is not  engorged. There is calcification of the aortic knob. The mediastinum  is normal in width. There is no pleural effusion. The observed  portions of the bony thorax exhibit no acute abnormalities.  IMPRESSION:  There is hyperinflation consistent with COPD. There is likely an  element of underlying pulmonary fibrosis which is stable. There is  no evidence of pneumonia, CHF, or other acute cardiopulmonary  abnormality.    12/07/11 CT 1. Waxing and waning reticulonodular interstitial opacities in the  lungs over the last 5 years, with tree in bud morphology suggesting  atypical infectious bronchiolitis and airway plugging.  2. Bronchiectasis and progressive atelectasis in the right middle  lobe of the last 5 years, likely secondary to recurrent infection.  Given the overall volume loss, I am doubtful of malignancy in this  vicinity, and active pneumonia is not observed.  3. Mosaic attenuation with reduced conspicuity of vessels in the  more lucent areas of lungs, favoring either chronic pulmonary  embolus or obstructive bronchiolitis.  4. Coronary artery atherosclerosis       Assessment & Plan:  Bronchiectasis With recent flare. Still w cough but appears to be more UA irritation.  - continue allergy routine - restart protonix - get more portable o2 tanks - rov 2 mon

## 2013-09-21 ENCOUNTER — Ambulatory Visit (INDEPENDENT_AMBULATORY_CARE_PROVIDER_SITE_OTHER): Payer: Medicare Other

## 2013-09-21 DIAGNOSIS — J449 Chronic obstructive pulmonary disease, unspecified: Secondary | ICD-10-CM | POA: Diagnosis not present

## 2013-10-05 ENCOUNTER — Telehealth: Payer: Self-pay | Admitting: Gastroenterology

## 2013-10-05 ENCOUNTER — Other Ambulatory Visit: Payer: Self-pay

## 2013-10-05 DIAGNOSIS — Z1211 Encounter for screening for malignant neoplasm of colon: Secondary | ICD-10-CM

## 2013-10-05 NOTE — Telephone Encounter (Signed)
Patient never received new instructions in the mail.  I have sent her another copy of them.  She will call for any additional questions or concerns

## 2013-10-08 DIAGNOSIS — D126 Benign neoplasm of colon, unspecified: Secondary | ICD-10-CM

## 2013-10-08 HISTORY — DX: Benign neoplasm of colon, unspecified: D12.6

## 2013-10-14 ENCOUNTER — Other Ambulatory Visit: Payer: Self-pay | Admitting: General Practice

## 2013-10-14 MED ORDER — WARFARIN SODIUM 5 MG PO TABS: ORAL_TABLET | ORAL | Status: DC

## 2013-10-16 ENCOUNTER — Telehealth: Payer: Self-pay | Admitting: Emergency Medicine

## 2013-10-16 ENCOUNTER — Telehealth: Payer: Self-pay | Admitting: Gastroenterology

## 2013-10-16 NOTE — Telephone Encounter (Signed)
Called and made pt aware. Nothing further needed 

## 2013-10-16 NOTE — Telephone Encounter (Signed)
Called and spoke with pt and she stated that she is scheduled to have colonoscopy done on Monday in the hospital due to the pt being on oxygen.  She stated that she wanted to check with RB to make sure that he is ok with her having the procedure and being given the deep sedation.  RB please advise. Thanks  Allergies  Allergen Reactions  . Amoxicillin-Pot Clavulanate     REACTION: diarrhea  . Ciclesonide     REACTION: bad smell  . Ciprofloxacin Other (See Comments)    dizziness  . Raloxifene     REACTION: risk of recurrent stroke  . Sulfonamide Derivatives     REACTION: tongue swells     Current Outpatient Prescriptions on File Prior to Visit  Medication Sig Dispense Refill  . amLODipine (NORVASC) 5 MG tablet Take 1 tablet (5 mg total) by mouth daily.  90 tablet  3  . aspirin 81 MG EC tablet Take 81 mg by mouth daily.        . calcium carbonate (TUMS) 500 MG chewable tablet Chew 1 tablet by mouth daily as needed.        . Digestive Enzymes (PAPAYA AND ENZYMES) CHEW Chew by mouth. Chew 2 tablets with meals as needed       . loratadine (CLARITIN) 10 MG tablet Take 10 mg by mouth daily.        [name] losartan (COZAAR) 100 MG tablet Take 1 tablet (100 mg total) by mouth daily.  90 tablet  3  . meclizine (ANTIVERT) 12.5 MG tablet Take 1 tablet (12.5 mg total) by mouth 3 (three) times daily as needed.  50 tablet  4  . mometasone (NASONEX) 50 MCG/ACT nasal spray Place 2 sprays into the nose daily as needed.       . Multiple Vitamins-Minerals (MACUVITE) TABS Take 1 tablet by mouth daily.        . pantoprazole (PROTONIX) 40 MG tablet Take 1 tablet (40 mg total) by mouth daily.  90 tablet  3  . peg 3350 powder (MOVIPREP) 100 G SOLR Take 1 kit (200 g total) by mouth once.  1 kit  0  . PROAIR HFA 108 (90 BASE) MCG/ACT inhaler INHALE 2 PUFFS INTO THE LUNGS EVERY 6 (SIX) HOURS AS NEEDED FOR WHEEZING.  8.5 each  3  . Probiotic Product (PROBIOTIC DAILY PO) Take 1 tablet by mouth daily.      [name] Respiratory  Therapy Supplies (FLUTTER) DEVI 1 Device by Does not apply route as needed.  1 each  0  . warfarin (COUMADIN) 5 MG tablet Take as directed by anticoagulation clinic  30 tablet  3   No current facility-administered medications on file prior to visit.

## 2013-10-16 NOTE — Telephone Encounter (Signed)
All questions answered about the upcoming procedure.  She will call back for any additional questions.

## 2013-10-16 NOTE — Telephone Encounter (Signed)
I think this is acceptable - agree with getting the procedure done in the hospital.

## 2013-10-19 ENCOUNTER — Encounter (HOSPITAL_COMMUNITY)
Admission: RE | Disposition: A | Discharge status: Home / Self Care | Payer: Self-pay | Source: Ambulatory Visit | Attending: Gastroenterology

## 2013-10-19 ENCOUNTER — Encounter (HOSPITAL_COMMUNITY): Payer: Self-pay | Admitting: *Deleted

## 2013-10-19 ENCOUNTER — Ambulatory Visit (HOSPITAL_COMMUNITY)
Admission: RE | Admit: 2013-10-19 | Discharge: 2013-10-19 | Disposition: A | Discharge status: Home / Self Care | Payer: Medicare Other | Source: Ambulatory Visit | Attending: Gastroenterology | Admitting: Gastroenterology

## 2013-10-19 DIAGNOSIS — K921 Melena: Secondary | ICD-10-CM

## 2013-10-19 DIAGNOSIS — R195 Other fecal abnormalities: Secondary | ICD-10-CM | POA: Diagnosis not present

## 2013-10-19 DIAGNOSIS — Z860100 Personal history of colon polyps, unspecified: Secondary | ICD-10-CM | POA: Insufficient documentation

## 2013-10-19 DIAGNOSIS — R197 Diarrhea, unspecified: Secondary | ICD-10-CM | POA: Diagnosis not present

## 2013-10-19 DIAGNOSIS — R198 Other specified symptoms and signs involving the digestive system and abdomen: Secondary | ICD-10-CM | POA: Diagnosis not present

## 2013-10-19 DIAGNOSIS — D126 Benign neoplasm of colon, unspecified: Secondary | ICD-10-CM | POA: Diagnosis not present

## 2013-10-19 DIAGNOSIS — D128 Benign neoplasm of rectum: Secondary | ICD-10-CM | POA: Insufficient documentation

## 2013-10-19 DIAGNOSIS — D129 Benign neoplasm of anus and anal canal: Principal | ICD-10-CM

## 2013-10-19 DIAGNOSIS — K5730 Diverticulosis of large intestine without perforation or abscess without bleeding: Secondary | ICD-10-CM | POA: Diagnosis not present

## 2013-10-19 DIAGNOSIS — R110 Nausea: Secondary | ICD-10-CM | POA: Diagnosis not present

## 2013-10-19 DIAGNOSIS — R12 Heartburn: Secondary | ICD-10-CM | POA: Insufficient documentation

## 2013-10-19 DIAGNOSIS — Z1211 Encounter for screening for malignant neoplasm of colon: Secondary | ICD-10-CM

## 2013-10-19 DIAGNOSIS — K648 Other hemorrhoids: Secondary | ICD-10-CM | POA: Insufficient documentation

## 2013-10-19 HISTORY — PX: COLONOSCOPY: SHX5424

## 2013-10-19 HISTORY — DX: Cerebral infarction, unspecified: I63.9

## 2013-10-19 SURGERY — COLONOSCOPY
Anesthesia: Moderate Sedation

## 2013-10-19 MED ORDER — MIDAZOLAM HCL 10 MG/2ML IJ SOLN
INTRAMUSCULAR | Status: AC
  Filled 2013-10-19: qty 4

## 2013-10-19 MED ORDER — FENTANYL CITRATE 0.05 MG/ML IJ SOLN
INTRAMUSCULAR | Status: DC | PRN
  Administered 2013-10-19 (×3): 25 ug via INTRAVENOUS

## 2013-10-19 MED ORDER — SODIUM CHLORIDE 0.9 % IV SOLN
INTRAVENOUS | Status: DC
  Administered 2013-10-19: 500 mL via INTRAVENOUS

## 2013-10-19 MED ORDER — FENTANYL CITRATE 0.05 MG/ML IJ SOLN
INTRAMUSCULAR | Status: AC
  Filled 2013-10-19: qty 4

## 2013-10-19 MED ORDER — DIPHENHYDRAMINE HCL 50 MG/ML IJ SOLN
INTRAMUSCULAR | Status: AC
  Filled 2013-10-19: qty 1

## 2013-10-19 MED ORDER — MIDAZOLAM HCL 5 MG/5ML IJ SOLN
INTRAMUSCULAR | Status: DC | PRN
  Administered 2013-10-19 (×3): 2 mg via INTRAVENOUS

## 2013-10-19 NOTE — Discharge Instructions (Signed)
Colonoscopy °Care After °These instructions give you information on caring for yourself after your procedure. Your doctor may also give you more specific instructions. Call your doctor if you have any problems or questions after your procedure. °HOME CARE °· Take it easy for the next 24 hours. °· Rest. °· Walk or use warm packs on your belly (abdomen) if you have belly cramping or gas. °· Do not drive for 24 hours. °· You may shower. °· Do not sign important papers or use machinery for 24 hours. °· Drink enough fluids to keep your pee (urine) clear or pale yellow. °· Resume your normal diet. Avoid heavy or fried foods. °· Avoid alcohol. °· Continue taking your normal medicines. °· Only take medicine as told by your doctor. Do not take aspirin. °If you had growths (polyps) removed: °· Do not take aspirin. °· Do not drink alcohol for 7 days or as told by your doctor. °· Eat a soft diet for 24 hours. °GET HELP RIGHT AWAY IF: °· You have a fever. °· You pass clumps of tissue (blood clots) or fill the toilet with blood. °· You have belly pain that gets worse and medicine does not help. °· Your belly is puffy (swollen). °· You feel sick to your stomach (nauseous) or throw up (vomit). °MAKE SURE YOU: °· Understand these instructions. °· Will watch your condition. °· Will get help right away if you are not doing well or get worse. °Document Released: 10/27/2010 Document Revised: 12/17/2011 Document Reviewed: 06/01/2013 °[employer]® Patient Information ©2014 [employer]. ° °

## 2013-10-19 NOTE — H&P (Signed)
History of Present Illness: This is an 80 year old female complaining of intermittent small-volume hematochezia, intermittent morning diarrhea alternating with normal stools, occasional rust colored stools, intermittent heartburn and intermittent nausea. She last underwent colonoscopy in 2009 by Dr. Watt Climes showing a hyperplastic polyp, internal and external hemorrhoids. She previously underwent a 24-hour pH study in 2001 that did not demonstrate GERD. She underwent EGD and EUS by Dr. Ardis Hughs in October 2011. The EGD was normal. EUS showed 3 pancreatic cysts in the head, neck and body. FNA was performed of the largest cyst in the head of the pancreas with negative findings. Dr. [name] recently changed her from pantoprazole to Zegerid however it appears she's taking both medications. C. diff in 01/2013 was negative.   Current Medications, Allergies, Past Medical History, Past Surgical History, Family History and Social History were reviewed in [employer] record.   Physical Exam:  General: Well developed , well nourished, elderly, no acute distress  Head: Normocephalic and atraumatic  Eyes: sclerae anicteric, EOMI  Ears: Normal auditory acuity  Mouth: No deformity or lesions  Lungs: Clear throughout to auscultation  Heart: Regular rate and rhythm; no murmurs, rubs or bruits  Abdomen: Soft, non tender and non distended. No masses, hepatosplenomegaly or hernias noted. Normal Bowel sounds  Rectal: external hemorrhoid, 2+ heme pos stool, no lesions  Musculoskeletal: Symmetrical with no gross deformities  Pulses: Normal pulses noted  Extremities: No clubbing, cyanosis, edema or deformities noted  Neurological: Alert oriented x 4, grossly nonfocal  Psychological: Alert and cooperative. Depressed affect   Assessment and Recommendations:   1. Possible GERD. Continue Zegrid or pantoprazole daily.   2. Hematochezia, heme pos stool, change in bowel habits. Schedule colonoscopy. The  risks, benefits and alternatives to a 5 day hold of Coumadin were discussed with the patient and she consents to proceed. The risks, benefits, and alternatives to colonoscopy with possible biopsy and possible polypectomy were discussed with the patient and they consent to proceed.

## 2013-10-19 NOTE — Op Note (Signed)
[facility] [place], [id number]   COLONOSCOPY PROCEDURE REPORT PATIENT: Alleyah, Twombly  MR#: [id number] BIRTHDATE: 1930-03-23 , 83  yrs. old GENDER: Female ENDOSCOPIST: Ladene Artist, MD, [facility] PROCEDURE DATE:  10/19/2013 PROCEDURE:   Colonoscopy with snare polypectomy First Screening Colonoscopy - Avg.  risk and is 50 yrs.  old or older - No.  Prior Negative Screening - Now for repeat screening. N/A  History of Adenoma - Now for follow-up colonoscopy & has been > or = to 3 yrs.  N/A  Polyps Removed Today? Yes. ASA CLASS:   Class III INDICATIONS:hematochezia and heme-positive stool. MEDICATIONS: medications were titrated to patient response per physician's verbal order, Fentanyl 75 mcg IV, and Versed 6 mg IV DESCRIPTION OF PROCEDURE:   After the risks benefits and alternatives of the procedure were thoroughly explained, informed consent was obtained.  A digital rectal exam revealed no abnormalities of the rectum.   The Pentax Ped Colon [id number] endoscope was introduced through the anus and advanced to the cecum, which was identified by both the appendix and ileocecal valve. No adverse events experienced.   The quality of the prep was good, using MoviPrep  The instrument was then slowly withdrawn as the colon was fully examined.  COLON FINDINGS: A semi-pedunculated polyp measuring 1 cm in size was found in the proximal rectum.  A polypectomy was performed using snare cautery.  The resection was complete and the polyp tissue was completely retrieved.   Moderate diverticulosis was noted in the descending colon and sigmoid colon.   The colon was otherwise normal.  There was no diverticulosis, inflammation, polyps or cancers unless previously stated.  Retroflexed views revealed moderate internal hemorrhoids. The time to cecum=3 minutes 00 seconds.  Withdrawal time=11 minutes 00 seconds.  The scope was withdrawn and the procedure  completed. COMPLICATIONS: There were no complications. ENDOSCOPIC IMPRESSION: 1.   Semi-pedunculated polyp measuring 1 cm in the proximal rectum; polypectomy performed using snare cautery 2.   Moderate diverticulosis in the descending colon and sigmoid colon 3.   Moderate internal hemorrhoids RECOMMENDATIONS: 1.  Hold aspirin, aspirin products, and anti-inflammatory medication for 2 weeks. 2.  Await pathology results 3.  High fiber diet with liberal fluid intake. 4.  Resume Coumadin (warfarin) today and have your PT/INR checked within 1 week. 5.  Given your age and comorbidities, you will not need another colonoscopy for colon cancer screening or polyp surveillance. These types of tests usually stop around the age 25. 6.  Prep H supp daily as needed  eSigned:  Ladene Artist, MD, [facility] 10/19/2013 9:02 AM

## 2013-10-20 ENCOUNTER — Encounter (HOSPITAL_COMMUNITY): Payer: Self-pay | Admitting: Gastroenterology

## 2013-10-21 ENCOUNTER — Encounter: Payer: Self-pay | Admitting: Gastroenterology

## 2013-10-27 ENCOUNTER — Ambulatory Visit (INDEPENDENT_AMBULATORY_CARE_PROVIDER_SITE_OTHER): Payer: Medicare Other | Admitting: General Practice

## 2013-10-27 DIAGNOSIS — Z7901 Long term (current) use of anticoagulants: Secondary | ICD-10-CM

## 2013-10-27 DIAGNOSIS — I2699 Other pulmonary embolism without acute cor pulmonale: Secondary | ICD-10-CM

## 2013-10-27 DIAGNOSIS — Z5181 Encounter for therapeutic drug level monitoring: Secondary | ICD-10-CM

## 2013-10-27 LAB — POCT INR: INR: 2.9

## 2013-10-27 NOTE — Progress Notes (Signed)
Pre-visit discussion using our clinic review tool. No additional management support is needed unless otherwise documented below in the visit note.  

## 2013-11-04 ENCOUNTER — Ambulatory Visit (INDEPENDENT_AMBULATORY_CARE_PROVIDER_SITE_OTHER): Payer: Medicare Other | Admitting: Internal Medicine

## 2013-11-04 ENCOUNTER — Encounter: Payer: Self-pay | Admitting: Internal Medicine

## 2013-11-04 VITALS — BP 172/82 | HR 85 | Temp 98.1°F | Ht 64.0 in | Wt 156.5 lb

## 2013-11-04 DIAGNOSIS — R1310 Dysphagia, unspecified: Secondary | ICD-10-CM

## 2013-11-04 DIAGNOSIS — J019 Acute sinusitis, unspecified: Secondary | ICD-10-CM | POA: Diagnosis not present

## 2013-11-04 DIAGNOSIS — I10 Essential (primary) hypertension: Secondary | ICD-10-CM

## 2013-11-04 MED ORDER — LEVOFLOXACIN 250 MG PO TABS: 250.0000 mg | ORAL_TABLET | Freq: Every day | ORAL | Status: DC

## 2013-11-04 NOTE — Assessment & Plan Note (Signed)
elev today, liekly situational, ok to follow up BP at home as she does

## 2013-11-04 NOTE — Assessment & Plan Note (Signed)
Mild to mod, for antibx course,  to f/u any worsening symptoms or concerns 

## 2013-11-04 NOTE — Progress Notes (Signed)
Pre-visit discussion using our clinic review tool. No additional management support is needed unless otherwise documented below in the visit note.  

## 2013-11-04 NOTE — Progress Notes (Signed)
Subjective:    Patient ID: [name], female    DOB: 1930-10-07, 80 y.o.   MRN: [id number]  HPI   Here with 2-3 days acute onset fever, facial pain, pressure, headache, general weakness and malaise, and greenish d/c, with mild ST and cough, but pt denies chest pain, wheezing, increased sob or doe, orthopnea, PND, increased LE swelling, palpitations, dizziness or syncope.  Also with 3-4 mo recurring grad worsening dysphagia with sticking to solids at the lower chest/upper abd. No n/v, wt loss, and Denies worsening reflux, abd pain, dysphagia, n/v, bowel change or blood.Had recent colonosocpy. BP at home usually < 140/90 per pt Past Medical History  Diagnosis Date  . HYPERLIPIDEMIA   . ANXIETY   . DEPRESSION   . COMMON MIGRAINE   . Unspecified hearing loss   . HYPERTENSION   . CORONARY ARTERY DISEASE   . PULMONARY HYPERTENSION, SECONDARY   . CAROTID ARTERY STENOSIS, RIGHT   . Chronic rhinitis   . ALLERGIC RHINITIS   . COPD   . GERD   . DIVERTICULOSIS, COLON   . CYST/PSEUDOCYST, PANCREAS   . OVERACTIVE BLADDER   . OSTEOPOROSIS   . DIZZINESS, CHRONIC   . Pulmonary embolism 2008  . Bronchiectasis 12/11/2011    CT chest dx  . Esophageal stricture   . Complication of anesthesia     dizzy  . Stroke    Past Surgical History  Procedure Laterality Date  . Abdominal hysterectomy  1965  . Egd  1999  . Cholecystectomy  1963  . Tonsillectomy    . Appendectomy  1963  . S/p sinus surgury    . Pancreatic cyst drainage      x 2  . Colonoscopy N/A 10/19/2013    Procedure: COLONOSCOPY;  Surgeon: Ladene Artist, MD;  Location: [facility];  Service: Endoscopy;  Laterality: N/A;    reports that she has never smoked. She has never used smokeless tobacco. She reports that she does not drink alcohol or use illicit drugs. family history includes Breast cancer in her daughter; Emphysema in her father; Heart disease in her mother; Lymphoma in her sister; Stroke in her father. Allergies    Allergen Reactions  . Amoxicillin-Pot Clavulanate     REACTION: diarrhea  . Ciclesonide     REACTION: bad smell  . Ciprofloxacin Other (See Comments)    dizziness  . Raloxifene     REACTION: risk of recurrent stroke  . Sulfonamide Derivatives     REACTION: tongue swells   Current Outpatient Prescriptions on File Prior to Visit  Medication Sig Dispense Refill  . amLODipine (NORVASC) 5 MG tablet Take 1 tablet (5 mg total) by mouth daily.  90 tablet  3  . aspirin 81 MG EC tablet Take 81 mg by mouth daily.        . calcium carbonate (TUMS) 500 MG chewable tablet Chew 1 tablet by mouth daily as needed.        . Digestive Enzymes (PAPAYA AND ENZYMES) CHEW Chew by mouth. Chew 2 tablets with meals as needed       . loratadine (CLARITIN) 10 MG tablet Take 10 mg by mouth daily.        [name] losartan (COZAAR) 100 MG tablet Take 1 tablet (100 mg total) by mouth daily.  90 tablet  3  . meclizine (ANTIVERT) 12.5 MG tablet Take 1 tablet (12.5 mg total) by mouth 3 (three) times daily as needed.  50 tablet  4  .  mometasone (NASONEX) 50 MCG/ACT nasal spray Place 2 sprays into the nose daily as needed.       . Multiple Vitamins-Minerals (MACUVITE) TABS Take 1 tablet by mouth daily.        . pantoprazole (PROTONIX) 40 MG tablet Take 1 tablet (40 mg total) by mouth daily.  90 tablet  3  . PROAIR HFA 108 (90 BASE) MCG/ACT inhaler INHALE 2 PUFFS INTO THE LUNGS EVERY 6 (SIX) HOURS AS NEEDED FOR WHEEZING.  8.5 each  3  . Probiotic Product (PROBIOTIC DAILY PO) Take 1 tablet by mouth daily.      [name] Respiratory Therapy Supplies (FLUTTER) DEVI 1 Device by Does not apply route as needed.  1 each  0  . warfarin (COUMADIN) 5 MG tablet Take as directed by anticoagulation clinic  30 tablet  3   No current facility-administered medications on file prior to visit.   Review of Systems  Constitutional: Negative for unexpected weight change, or unusual diaphoresis  HENT: Negative for tinnitus.   Eyes: Negative for  photophobia and visual disturbance.  Respiratory: Negative for choking and stridor.   Gastrointestinal: Negative for vomiting and blood in stool.  Genitourinary: Negative for hematuria and decreased urine volume.  Musculoskeletal: Negative for acute joint swelling Skin: Negative for color change and wound.  Neurological: Negative for tremors and numbness other than noted  Psychiatric/Behavioral: Negative for decreased concentration or  hyperactivity.       Objective:   Physical Exam BP 172/82  Pulse 85  Temp(Src) 98.1 F (36.7 C) (Oral)  Ht 5\' 4"  (1.626 m)  Wt 156 lb 8 oz (70.988 kg)  BMI 26.85 kg/m2  SpO2 91% VS noted,  Constitutional: Pt appears well-developed and well-nourished.  HENT: Head: NCAT.  Right Ear: External ear normal.  Left Ear: External ear normal.  Bilat tm's with mild erythema.  Max sinus areas mild tender.  Pharynx with mild erythema, no exudate Eyes: Conjunctivae and EOM are normal. Pupils are equal, round, and reactive to light.  Neck: Normal range of motion. Neck supple.  Cardiovascular: Normal rate and regular rhythm.   Pulmonary/Chest: Effort normal and breath sounds with rales and isolated wheeze right mid and upper lung - stable for her.  Abd:  Soft, NT, non-distended, + BS Neurological: Pt is alert. Not confused  Skin: Skin is warm. No erythema.  Psychiatric: Pt behavior is normal. Thought content normal. mild nervous    Assessment & Plan:

## 2013-11-04 NOTE — Patient Instructions (Signed)
Please take all new medication as prescribed - the antibiotic Please continue all other medications as before, and refills have been done if requested. Please have the pharmacy call with any other refills you may need.  You will be contacted regarding the referral for: Dr Fuller Plan  Please keep your appointments with your specialists as you may have planned

## 2013-11-13 DIAGNOSIS — J0100 Acute maxillary sinusitis, unspecified: Secondary | ICD-10-CM | POA: Diagnosis not present

## 2013-11-13 DIAGNOSIS — H6120 Impacted cerumen, unspecified ear: Secondary | ICD-10-CM | POA: Diagnosis not present

## 2013-11-18 ENCOUNTER — Ambulatory Visit: Payer: Medicare Other | Admitting: Emergency Medicine

## 2013-11-20 ENCOUNTER — Ambulatory Visit: Payer: Medicare Other | Admitting: Emergency Medicine

## 2013-11-20 ENCOUNTER — Telehealth: Payer: Self-pay | Admitting: Gastroenterology

## 2013-11-20 NOTE — Telephone Encounter (Signed)
Patient states that Dr. [name] has referred her for chronic nausea.  She is scheduled for 12/07/13

## 2013-11-26 ENCOUNTER — Ambulatory Visit: Payer: Medicare Other | Admitting: Emergency Medicine

## 2013-12-02 ENCOUNTER — Ambulatory Visit: Payer: Medicare Other | Admitting: Internal Medicine

## 2013-12-07 ENCOUNTER — Ambulatory Visit: Payer: Medicare Other | Admitting: Gastroenterology

## 2013-12-16 ENCOUNTER — Encounter: Payer: Self-pay | Admitting: Internal Medicine

## 2013-12-16 ENCOUNTER — Ambulatory Visit (INDEPENDENT_AMBULATORY_CARE_PROVIDER_SITE_OTHER): Payer: Medicare Other | Admitting: General Practice

## 2013-12-16 ENCOUNTER — Ambulatory Visit (INDEPENDENT_AMBULATORY_CARE_PROVIDER_SITE_OTHER): Payer: Medicare Other | Admitting: Internal Medicine

## 2013-12-16 VITALS — BP 152/80 | HR 73 | Temp 97.6°F | Ht 65.0 in | Wt 157.8 lb

## 2013-12-16 DIAGNOSIS — Z1231 Encounter for screening mammogram for malignant neoplasm of breast: Secondary | ICD-10-CM | POA: Diagnosis not present

## 2013-12-16 DIAGNOSIS — I2699 Other pulmonary embolism without acute cor pulmonale: Secondary | ICD-10-CM

## 2013-12-16 DIAGNOSIS — Z5181 Encounter for therapeutic drug level monitoring: Secondary | ICD-10-CM | POA: Diagnosis not present

## 2013-12-16 DIAGNOSIS — Z7901 Long term (current) use of anticoagulants: Secondary | ICD-10-CM | POA: Diagnosis not present

## 2013-12-16 DIAGNOSIS — Z515 Encounter for palliative care: Secondary | ICD-10-CM | POA: Insufficient documentation

## 2013-12-16 DIAGNOSIS — I10 Essential (primary) hypertension: Secondary | ICD-10-CM | POA: Diagnosis not present

## 2013-12-16 DIAGNOSIS — R7309 Other abnormal glucose: Secondary | ICD-10-CM

## 2013-12-16 DIAGNOSIS — J069 Acute upper respiratory infection, unspecified: Secondary | ICD-10-CM

## 2013-12-16 DIAGNOSIS — R7302 Impaired glucose tolerance (oral): Secondary | ICD-10-CM

## 2013-12-16 LAB — POCT INR: INR: 3.1

## 2013-12-16 MED ORDER — AZITHROMYCIN 250 MG PO TABS: ORAL_TABLET | ORAL | Status: DC

## 2013-12-16 MED ORDER — IRBESARTAN 300 MG PO TABS: 300.0000 mg | ORAL_TABLET | Freq: Every day | ORAL | Status: DC

## 2013-12-16 NOTE — Assessment & Plan Note (Signed)
stable overall by history and exam, recent data reviewed with pt, and pt to continue medical treatment as before,  to f/u any worsening symptoms or concerns Lab Results  Component Value Date   HGBA1C 5.8 05/26/2013    

## 2013-12-16 NOTE — Progress Notes (Signed)
Subjective:    Patient ID: [name], female    DOB: April 09, 1930, 80 y.o.   MRN: [id number]  HPI   Here with 2-3 days acute onset ? Low grade fever, head congestion, headache, left neck pain and lymph node swelling, general weakness and malaise, with mild ST and cough, but pt denies chest pain, wheezing, increased sob or doe, orthopnea, PND, increased LE swelling, palpitations, dizziness or syncope.  Pt denies polydipsia, polyuria, Pt denies new neurological symptoms such as new headache, or facial or extremity weakness or numbness Past Medical History  Diagnosis Date  . HYPERLIPIDEMIA   . ANXIETY   . DEPRESSION   . COMMON MIGRAINE   . Unspecified hearing loss   . HYPERTENSION   . CORONARY ARTERY DISEASE   . PULMONARY HYPERTENSION, SECONDARY   . CAROTID ARTERY STENOSIS, RIGHT   . Chronic rhinitis   . ALLERGIC RHINITIS   . COPD   . GERD   . DIVERTICULOSIS, COLON   . CYST/PSEUDOCYST, PANCREAS   . OVERACTIVE BLADDER   . OSTEOPOROSIS   . DIZZINESS, CHRONIC   . Pulmonary embolism 2008  . Bronchiectasis 12/11/2011    CT chest dx  . Esophageal stricture   . Complication of anesthesia     dizzy  . Stroke   . Tubular adenoma of colon    Past Surgical History  Procedure Laterality Date  . Abdominal hysterectomy  1965  . Egd  1999  . Cholecystectomy  1963  . Tonsillectomy    . Appendectomy  1963  . S/p sinus surgury    . Pancreatic cyst drainage      x 2  . Colonoscopy N/A 10/19/2013    Procedure: COLONOSCOPY;  Surgeon: Ladene Artist, MD;  Location: [facility];  Service: Endoscopy;  Laterality: N/A;    reports that she has never smoked. She has never used smokeless tobacco. She reports that she does not drink alcohol or use illicit drugs. family history includes Breast cancer in her daughter; Emphysema in her father; Heart disease in her mother; Lymphoma in her sister; Stroke in her father. Allergies  Allergen Reactions  . Amoxicillin-Pot Clavulanate     REACTION:  diarrhea  . Ciclesonide     REACTION: bad smell  . Ciprofloxacin Other (See Comments)    dizziness  . Raloxifene     REACTION: risk of recurrent stroke  . Sulfonamide Derivatives     REACTION: tongue swells   Current Outpatient Prescriptions on File Prior to Visit  Medication Sig Dispense Refill  . amLODipine (NORVASC) 5 MG tablet Take 1 tablet (5 mg total) by mouth daily.  90 tablet  3  . aspirin 81 MG EC tablet Take 81 mg by mouth daily.        . calcium carbonate (TUMS) 500 MG chewable tablet Chew 1 tablet by mouth daily as needed.        . Digestive Enzymes (PAPAYA AND ENZYMES) CHEW Chew by mouth. Chew 2 tablets with meals as needed       . levofloxacin (LEVAQUIN) 250 MG tablet Take 1 tablet (250 mg total) by mouth daily.  10 tablet  0  . loratadine (CLARITIN) 10 MG tablet Take 10 mg by mouth daily.        . meclizine (ANTIVERT) 12.5 MG tablet Take 1 tablet (12.5 mg total) by mouth 3 (three) times daily as needed.  50 tablet  4  . mometasone (NASONEX) 50 MCG/ACT nasal spray Place 2 sprays into the  nose daily as needed.       . Multiple Vitamins-Minerals (MACUVITE) TABS Take 1 tablet by mouth daily.        . pantoprazole (PROTONIX) 40 MG tablet Take 1 tablet (40 mg total) by mouth daily.  90 tablet  3  . PROAIR HFA 108 (90 BASE) MCG/ACT inhaler INHALE 2 PUFFS INTO THE LUNGS EVERY 6 (SIX) HOURS AS NEEDED FOR WHEEZING.  8.5 each  3  . Probiotic Product (PROBIOTIC DAILY PO) Take 1 tablet by mouth daily.      [name] Respiratory Therapy Supplies (FLUTTER) DEVI 1 Device by Does not apply route as needed.  1 each  0  . warfarin (COUMADIN) 5 MG tablet Take as directed by anticoagulation clinic  30 tablet  3   No current facility-administered medications on file prior to visit.   Review of Systems  Constitutional: Negative forunusual diaphoresis  HENT: Negative for tinnitus.   Eyes: Negative for photophobia and visual disturbance.  Respiratory: Negative for choking and stridor.     Gastrointestinal: Negative for vomiting and blood in stool.  Genitourinary: Negative for hematuria and decreased urine volume.  Musculoskeletal: Negative for acute joint swelling Skin: Negative for color change and wound.  Neurological: Negative for tremors and numbness other than noted  Psychiatric/Behavioral: Negative for decreased concentration or  hyperactivity.       Objective:   Physical Exam BP 152/80  Pulse 73  Temp(Src) 97.6 F (36.4 C) (Oral)  Ht 5\' 5"  (1.651 m)  Wt 157 lb 12 oz (71.555 kg)  BMI 26.25 kg/m2  SpO2 93% VS noted, mild ill Constitutional: Pt appears well-developed and well-nourished.  HENT: Head: NCAT.  Right Ear: External ear normal.  Left Ear: External ear normal.  Eyes: Conjunctivae and EOM are normal. Pupils are equal, round, and reactive to light.  Neck: Normal range of motion. Neck supple.  Cardiovascular: Normal rate and regular rhythm.   Pulmonary/Chest: Effort normal and breath sounds decreased with rare isolated exp wheeze right mid chest Neurological: Pt is alert. Not confused  Skin: Skin is warm. No erythema.  Psychiatric: Pt behavior is normal. Thought content normal.        Assessment & Plan:

## 2013-12-16 NOTE — Assessment & Plan Note (Signed)
Mild to mod, for antibx course,  to f/u any worsening symptoms or concerns 

## 2013-12-16 NOTE — Progress Notes (Signed)
Pre visit review using our clinic review tool, if applicable. No additional management support is needed unless otherwise documented below in the visit note. 

## 2013-12-16 NOTE — Patient Instructions (Signed)
OK to stop the losartan  Please take all new medication as prescribed - the antibiotic, and also the new BP medicine - avapro 300 mg per day  You will be contacted regarding the referral for: mammogram   Please continue all other medications as before, and refills have been done if requested. Please have the pharmacy call with any other refills you may need.  Please continue your efforts at being more active, low cholesterol diet, and weight control.  Please keep your appointments with your specialists as you have planned - the coumadin clinic  Please return in 6 months, or sooner if needed

## 2013-12-16 NOTE — Assessment & Plan Note (Signed)
Mild uncontrolled, to change losartan to avapro 300 qd, f/u BP at home and next visit BP Readings from Last 3 Encounters:  12/16/13 152/80  11/04/13 172/82  10/19/13 122/59

## 2014-01-06 ENCOUNTER — Other Ambulatory Visit: Payer: Self-pay | Admitting: General Practice

## 2014-01-06 MED ORDER — WARFARIN SODIUM 5 MG PO TABS: ORAL_TABLET | ORAL | Status: DC

## 2014-01-20 ENCOUNTER — Ambulatory Visit (INDEPENDENT_AMBULATORY_CARE_PROVIDER_SITE_OTHER): Payer: Medicare Other | Admitting: General Practice

## 2014-01-20 DIAGNOSIS — I2699 Other pulmonary embolism without acute cor pulmonale: Secondary | ICD-10-CM | POA: Diagnosis not present

## 2014-01-20 DIAGNOSIS — Z5181 Encounter for therapeutic drug level monitoring: Secondary | ICD-10-CM

## 2014-01-20 LAB — POCT INR: INR: 5.5

## 2014-01-20 NOTE — Progress Notes (Signed)
Pre visit review using our clinic review tool, if applicable. No additional management support is needed unless otherwise documented below in the visit note. 

## 2014-01-29 ENCOUNTER — Ambulatory Visit (INDEPENDENT_AMBULATORY_CARE_PROVIDER_SITE_OTHER): Payer: Medicare Other | Admitting: General Practice

## 2014-01-29 DIAGNOSIS — I2699 Other pulmonary embolism without acute cor pulmonale: Secondary | ICD-10-CM

## 2014-01-29 DIAGNOSIS — Z7901 Long term (current) use of anticoagulants: Secondary | ICD-10-CM

## 2014-01-29 DIAGNOSIS — Z5181 Encounter for therapeutic drug level monitoring: Secondary | ICD-10-CM

## 2014-01-29 LAB — POCT INR: INR: 2.1

## 2014-01-29 NOTE — Progress Notes (Signed)
Pre visit review using our clinic review tool, if applicable. No additional management support is needed unless otherwise documented below in the visit note. 

## 2014-02-06 ENCOUNTER — Encounter (HOSPITAL_COMMUNITY): Payer: Self-pay | Admitting: Emergency Medicine

## 2014-02-06 ENCOUNTER — Emergency Department (HOSPITAL_COMMUNITY): Payer: Medicare Other

## 2014-02-06 ENCOUNTER — Emergency Department (HOSPITAL_COMMUNITY)
Admission: EM | Admit: 2014-02-06 | Discharge: 2014-02-06 | Disposition: A | Discharge status: Home / Self Care | Payer: Medicare Other | Attending: Dermatology | Admitting: Dermatology

## 2014-02-06 DIAGNOSIS — Z86711 Personal history of pulmonary embolism: Secondary | ICD-10-CM | POA: Insufficient documentation

## 2014-02-06 DIAGNOSIS — Z7982 Long term (current) use of aspirin: Secondary | ICD-10-CM | POA: Insufficient documentation

## 2014-02-06 DIAGNOSIS — J40 Bronchitis, not specified as acute or chronic: Secondary | ICD-10-CM | POA: Diagnosis not present

## 2014-02-06 DIAGNOSIS — I2510 Atherosclerotic heart disease of native coronary artery without angina pectoris: Secondary | ICD-10-CM | POA: Insufficient documentation

## 2014-02-06 DIAGNOSIS — R0789 Other chest pain: Secondary | ICD-10-CM | POA: Insufficient documentation

## 2014-02-06 DIAGNOSIS — K219 Gastro-esophageal reflux disease without esophagitis: Secondary | ICD-10-CM | POA: Diagnosis not present

## 2014-02-06 DIAGNOSIS — J441 Chronic obstructive pulmonary disease with (acute) exacerbation: Secondary | ICD-10-CM | POA: Insufficient documentation

## 2014-02-06 DIAGNOSIS — IMO0002 Reserved for concepts with insufficient information to code with codable children: Secondary | ICD-10-CM | POA: Diagnosis not present

## 2014-02-06 DIAGNOSIS — Z79899 Other long term (current) drug therapy: Secondary | ICD-10-CM | POA: Insufficient documentation

## 2014-02-06 DIAGNOSIS — R079 Chest pain, unspecified: Secondary | ICD-10-CM

## 2014-02-06 DIAGNOSIS — I10 Essential (primary) hypertension: Secondary | ICD-10-CM | POA: Insufficient documentation

## 2014-02-06 DIAGNOSIS — Z8673 Personal history of transient ischemic attack (TIA), and cerebral infarction without residual deficits: Secondary | ICD-10-CM | POA: Insufficient documentation

## 2014-02-06 DIAGNOSIS — Z880 Allergy status to penicillin: Secondary | ICD-10-CM | POA: Insufficient documentation

## 2014-02-06 DIAGNOSIS — Z7901 Long term (current) use of anticoagulants: Secondary | ICD-10-CM | POA: Diagnosis not present

## 2014-02-06 DIAGNOSIS — I2789 Other specified pulmonary heart diseases: Secondary | ICD-10-CM | POA: Diagnosis not present

## 2014-02-06 DIAGNOSIS — Z8659 Personal history of other mental and behavioral disorders: Secondary | ICD-10-CM | POA: Insufficient documentation

## 2014-02-06 DIAGNOSIS — Z862 Personal history of diseases of the blood and blood-forming organs and certain disorders involving the immune mechanism: Secondary | ICD-10-CM | POA: Diagnosis not present

## 2014-02-06 DIAGNOSIS — Z8669 Personal history of other diseases of the nervous system and sense organs: Secondary | ICD-10-CM | POA: Diagnosis not present

## 2014-02-06 DIAGNOSIS — Z8639 Personal history of other endocrine, nutritional and metabolic disease: Secondary | ICD-10-CM | POA: Diagnosis not present

## 2014-02-06 DIAGNOSIS — Z860100 Personal history of colon polyps, unspecified: Secondary | ICD-10-CM | POA: Insufficient documentation

## 2014-02-06 DIAGNOSIS — M810 Age-related osteoporosis without current pathological fracture: Secondary | ICD-10-CM | POA: Insufficient documentation

## 2014-02-06 LAB — COMPREHENSIVE METABOLIC PANEL
ALT: 20 U/L (ref 0–35)
AST: 24 U/L (ref 0–37)
Albumin: 3.7 g/dL (ref 3.5–5.2)
Alkaline Phosphatase: 91 U/L (ref 39–117)
BUN: 12 mg/dL (ref 6–23)
CALCIUM: 9.5 mg/dL (ref 8.4–10.5)
CO2: 28 meq/L (ref 19–32)
CREATININE: 0.54 mg/dL (ref 0.50–1.10)
Chloride: 99 mEq/L (ref 96–112)
GFR calc non Af Amer: 86 mL/min — ABNORMAL LOW (ref >90)
Glucose, Bld: 98 mg/dL (ref 70–99)
Potassium: 4.2 mEq/L (ref 3.7–5.3)
Sodium: 139 mEq/L (ref 137–147)
TOTAL PROTEIN: 7.4 g/dL (ref 6.0–8.3)
Total Bilirubin: 0.6 mg/dL (ref 0.3–1.2)

## 2014-02-06 LAB — I-STAT TROPONIN, ED: Troponin i, poc: 0 ng/mL (ref 0.00–0.08)

## 2014-02-06 LAB — CBC
HEMATOCRIT: 41.5 % (ref 36.0–46.0)
Hemoglobin: 13.9 g/dL (ref 12.0–15.0)
MCH: 31.7 pg (ref 26.0–34.0)
MCHC: 33.5 g/dL (ref 30.0–36.0)
MCV: 94.5 fL (ref 78.0–100.0)
Platelets: 239 10*3/uL (ref 150–400)
RBC: 4.39 MIL/uL (ref 3.87–5.11)
RDW: 13.1 % (ref 11.5–15.5)
WBC: 7.3 10*3/uL (ref 4.0–10.5)

## 2014-02-06 LAB — LIPASE, BLOOD: LIPASE: 31 U/L (ref 11–59)

## 2014-02-06 LAB — TROPONIN I: Troponin I: <0.3 ng/mL (ref <0.30)

## 2014-02-06 LAB — PROTIME-INR
INR: 2.12 — ABNORMAL HIGH (ref 0.00–1.49)
PROTHROMBIN TIME: 23.1 s — AB (ref 11.6–15.2)

## 2014-02-06 LAB — D-DIMER, QUANTITATIVE: D-Dimer, Quant: 0.29 ug/mL-FEU (ref 0.00–0.48)

## 2014-02-06 MED ORDER — PANTOPRAZOLE SODIUM 40 MG IV SOLR
40.0000 mg | Freq: Once | INTRAVENOUS | Status: AC
Start: 2014-02-06 — End: 2014-02-06
  Administered 2014-02-06: 40 mg via INTRAVENOUS
  Filled 2014-02-06: qty 40

## 2014-02-06 MED ORDER — ALBUTEROL SULFATE (2.5 MG/3ML) 0.083% IN NEBU
2.5000 mg | INHALATION_SOLUTION | Freq: Once | RESPIRATORY_TRACT | Status: AC
  Administered 2014-02-06: 2.5 mg via RESPIRATORY_TRACT

## 2014-02-06 MED ORDER — GI COCKTAIL ~~LOC~~
30.0000 mL | Freq: Once | ORAL | Status: AC
  Administered 2014-02-06: 30 mL via ORAL
  Filled 2014-02-06: qty 30

## 2014-02-06 MED ORDER — IPRATROPIUM BROMIDE 0.02 % IN SOLN
0.5000 mg | Freq: Once | RESPIRATORY_TRACT | Status: DC
  Filled 2014-02-06: qty 2.5

## 2014-02-06 MED ORDER — FAMOTIDINE 20 MG PO TABS
20.0000 mg | ORAL_TABLET | Freq: Once | ORAL | Status: DC
  Filled 2014-02-06: qty 1

## 2014-02-06 MED ORDER — ONDANSETRON HCL 4 MG/2ML IJ SOLN
4.0000 mg | Freq: Once | INTRAMUSCULAR | Status: AC
  Administered 2014-02-06: 4 mg via INTRAVENOUS
  Filled 2014-02-06: qty 2

## 2014-02-06 MED ORDER — IPRATROPIUM-ALBUTEROL 0.5-2.5 (3) MG/3ML IN SOLN
3.0000 mL | Freq: Once | RESPIRATORY_TRACT | Status: AC
  Administered 2014-02-06: 3 mL via RESPIRATORY_TRACT
  Filled 2014-02-06: qty 3

## 2014-02-06 MED ORDER — ALBUTEROL SULFATE (2.5 MG/3ML) 0.083% IN NEBU: 5.0000 mg | INHALATION_SOLUTION | Freq: Once | RESPIRATORY_TRACT | Status: DC

## 2014-02-06 MED ORDER — MORPHINE SULFATE 2 MG/ML IJ SOLN
2.0000 mg | Freq: Once | INTRAMUSCULAR | Status: AC
  Administered 2014-02-06: 2 mg via INTRAVENOUS
  Filled 2014-02-06: qty 1

## 2014-02-06 MED ORDER — TRAMADOL HCL 50 MG PO TABS
50.0000 mg | ORAL_TABLET | Freq: Four times a day (QID) | ORAL | Status: DC | PRN
Start: 2014-02-06 — End: 2014-07-01

## 2014-02-06 NOTE — ED Provider Notes (Signed)
CSN: [id number]     Arrival date & time 02/06/14  1218 History   First MD Initiated Contact with Patient 02/06/14 1220     Chief Complaint  Patient presents with  . Chest Pain     (Consider location/radiation/quality/duration/timing/severity/associated sxs/prior Treatment) The history is provided by the patient.  pt c/o midline, lower chest/xiphoid area, chest discomfort since last night.  States symptoms constant. At rest. ?slightly worse when lying flat.  Has noted sense of feeling bloated as if needs to burp. Mild heartburn.  + hx gerd. Also notes recent increased in non productive cough, and increased wheezing, and was recently dx w bronchitis.notes chest congestion and mild sob.  States sob 'not new', has home o2, and inhalers which she uses prn. Non smoker, uses inhaler prn. Pt denies any other recent episodic chest pain or discomfort. No unusual fatigue. No hx cad. States normal stress test in past, no prior cath. Hx dvt/pe approximately 6-7 yrs ago, states was cause by prolonged car ride/travel.  On coumadin, no recurrent dvt or pe since initial dx.  No leg pain or swelling. No pnd. No hx chf.      Past Medical History  Diagnosis Date  . HYPERLIPIDEMIA   . ANXIETY   . DEPRESSION   . COMMON MIGRAINE   . Unspecified hearing loss   . HYPERTENSION   . CORONARY ARTERY DISEASE   . PULMONARY HYPERTENSION, SECONDARY   . CAROTID ARTERY STENOSIS, RIGHT   . Chronic rhinitis   . ALLERGIC RHINITIS   . COPD   . GERD   . DIVERTICULOSIS, COLON   . CYST/PSEUDOCYST, PANCREAS   . OVERACTIVE BLADDER   . OSTEOPOROSIS   . DIZZINESS, CHRONIC   . Pulmonary embolism 2008  . Bronchiectasis 12/11/2011    CT chest dx  . Esophageal stricture   . Complication of anesthesia     dizzy  . Stroke   . Tubular adenoma of colon    Past Surgical History  Procedure Laterality Date  . Abdominal hysterectomy  1965  . Egd  1999  . Cholecystectomy  1963  . Tonsillectomy    . Appendectomy  1963  . S/p  sinus surgury    . Pancreatic cyst drainage      x 2  . Colonoscopy N/A 10/19/2013    Procedure: COLONOSCOPY;  Surgeon: Ladene Artist, MD;  Location: [facility];  Service: Endoscopy;  Laterality: N/A;   Family History  Problem Relation Age of Onset  . Breast cancer Daughter   . Emphysema Father     was a smoker  . Lymphoma Sister   . Stroke Father   . Heart disease Mother    History  Substance Use Topics  . Smoking status: Never Smoker   . Smokeless tobacco: Never Used     Comment: spouse smoked in the home for 15 years  . Alcohol Use: No   OB History   Grav Para Term Preterm Abortions TAB SAB Ect Mult Living                 Review of Systems  Constitutional: Negative for fever and chills.  HENT: Negative for sore throat.   Eyes: Negative for redness.  Respiratory: Positive for cough and wheezing.   Cardiovascular: Negative for palpitations and leg swelling.  Gastrointestinal: Negative for vomiting, abdominal pain and diarrhea.  Genitourinary: Negative for dysuria and flank pain.  Musculoskeletal: Negative for back pain and neck pain.  Skin: Negative for rash.  Neurological:  Negative for headaches.  Hematological: Does not bruise/bleed easily.  Psychiatric/Behavioral: Negative for confusion.      Allergies  Amoxicillin-pot clavulanate; Ciclesonide; Ciprofloxacin; Raloxifene; and Sulfonamide derivatives  Home Medications   Prior to Admission medications   Medication Sig Start Date End Date Taking? Authorizing Provider  amLODipine (NORVASC) 5 MG tablet Take 1 tablet (5 mg total) by mouth daily. 07/16/13   [name], MD  aspirin 81 MG EC tablet Take 81 mg by mouth daily.      Historical Provider, MD  azithromycin (ZITHROMAX Z-PAK) 250 MG tablet Use as directed 12/16/13   [name], MD  calcium carbonate (TUMS) 500 MG chewable tablet Chew 1 tablet by mouth daily as needed.      Historical Provider, MD  Digestive Enzymes (PAPAYA AND ENZYMES) CHEW Chew by  mouth. Chew 2 tablets with meals as needed     Historical Provider, MD  irbesartan (AVAPRO) 300 MG tablet Take 1 tablet (300 mg total) by mouth daily. 12/16/13   [name], MD  levofloxacin (LEVAQUIN) 250 MG tablet Take 1 tablet (250 mg total) by mouth daily. 11/04/13   [name], MD  loratadine (CLARITIN) 10 MG tablet Take 10 mg by mouth daily.      Historical Provider, MD  meclizine (ANTIVERT) 12.5 MG tablet Take 1 tablet (12.5 mg total) by mouth 3 (three) times daily as needed. 04/28/13   Collene Gobble, MD  mometasone (NASONEX) 50 MCG/ACT nasal spray Place 2 sprays into the nose daily as needed.     Historical Provider, MD  Multiple Vitamins-Minerals (MACUVITE) TABS Take 1 tablet by mouth daily.      Historical Provider, MD  pantoprazole (PROTONIX) 40 MG tablet Take 1 tablet (40 mg total) by mouth daily. 07/16/13   [name], MD  PROAIR HFA 108 (90 BASE) MCG/ACT inhaler INHALE 2 PUFFS INTO THE LUNGS EVERY 6 (SIX) HOURS AS NEEDED FOR WHEEZING. 06/03/13   Collene Gobble, MD  Probiotic Product (PROBIOTIC DAILY PO) Take 1 tablet by mouth daily.    Historical Provider, MD  Respiratory Therapy Supplies (FLUTTER) DEVI 1 Device by Does not apply route as needed. 01/22/13   Collene Gobble, MD  warfarin (COUMADIN) 5 MG tablet Take as directed by anticoagulation clinic 01/06/14   [name], MD   BP 194/72  Pulse 74  Temp(Src) 98.4 F (36.9 C) (Oral)  Resp 15  SpO2 91% Physical Exam  Nursing note and vitals reviewed. Constitutional: She is oriented to person, place, and time. She appears well-developed and well-nourished. No distress.  HENT:  Mouth/Throat: Oropharynx is clear and moist.  Eyes: Conjunctivae are normal. No scleral icterus.  Neck: Neck supple. No JVD present. No tracheal deviation present.  Cardiovascular: Normal rate, regular rhythm, normal heart sounds and intact distal pulses.  Exam reveals no gallop and no friction rub.   No murmur heard. Pulmonary/Chest: Effort normal. No  respiratory distress. She has wheezes.  Abdominal: Soft. Normal appearance. She exhibits no distension and no mass. There is no tenderness. There is no rebound and no guarding.  Genitourinary:  No cva tenderness  Musculoskeletal: She exhibits no edema and no tenderness.  Neurological: She is alert and oriented to person, place, and time.  Skin: Skin is warm and dry. No rash noted. She is not diaphoretic.  Psychiatric: She has a normal mood and affect.    ED Course  Procedures (including critical care time)   Results for orders placed during  the hospital encounter of 02/06/14  CBC      Result Value Ref Range   WBC 7.3  4.0 - 10.5 K/uL   RBC 4.39  3.87 - 5.11 MIL/uL   Hemoglobin 13.9  12.0 - 15.0 g/dL   HCT 41.5  36.0 - 46.0 %   MCV 94.5  78.0 - 100.0 fL   MCH 31.7  26.0 - 34.0 pg   MCHC 33.5  30.0 - 36.0 g/dL   RDW 13.1  11.5 - 15.5 %   Platelets 239  150 - 400 K/uL  COMPREHENSIVE METABOLIC PANEL      Result Value Ref Range   Sodium 139  137 - 147 mEq/L   Potassium 4.2  3.7 - 5.3 mEq/L   Chloride 99  96 - 112 mEq/L   CO2 28  19 - 32 mEq/L   Glucose, Bld 98  70 - 99 mg/dL   BUN 12  6 - 23 mg/dL   Creatinine, Ser 0.54  0.50 - 1.10 mg/dL   Calcium 9.5  8.4 - 10.5 mg/dL   Total Protein 7.4  6.0 - 8.3 g/dL   Albumin 3.7  3.5 - 5.2 g/dL   AST 24  0 - 37 U/L   ALT 20  0 - 35 U/L   Alkaline Phosphatase 91  39 - 117 U/L   Total Bilirubin 0.6  0.3 - 1.2 mg/dL   GFR calc non Af Amer 86 (*) >90 mL/min   GFR calc Af Amer >90  >90 mL/min  D-DIMER, QUANTITATIVE      Result Value Ref Range   D-Dimer, Quant 0.29  0.00 - 0.48 ug/mL-FEU  TROPONIN I      Result Value Ref Range   Troponin I <0.30  <0.30 ng/mL  LIPASE, BLOOD      Result Value Ref Range   Lipase 31  11 - 59 U/L  PROTIME-INR      Result Value Ref Range   Prothrombin Time 23.1 (*) 11.6 - 15.2 seconds   INR 2.12 (*) 0.00 - 1.49  I-STAT TROPOININ, ED      Result Value Ref Range   Troponin i, poc 0.00  0.00 - 0.08 ng/mL    Comment 3            Dg Chest 2 View  02/06/2014   CLINICAL DATA:  Chest pain. History of pulmonary embolus 5 years ago. Patient is on Coumadin.  EXAM: CHEST  2 VIEW  COMPARISON:  08/13/2013  FINDINGS: Cardiac silhouette is normal in size. Normal mediastinal contours. Mild stable prominence of the pulmonary arteries. No hilar masses or evidence of adenopathy.  There are stable mildly prominent bronchovascular markings. Stable bronchitic change is noted in the medial lung bases, more evident on the right. There is minor stable scarring at both apices. No lung consolidation. No pulmonary edema. Lungs are hyperexpanded.  No pleural effusion.  No pneumothorax.  The bony thorax is demineralized but intact.  IMPRESSION: No acute cardiopulmonary disease.   Electronically Signed   By: Lajean Manes M.D.   On: 02/06/2014 13:50      EKG Interpretation   Date/Time:  Saturday Feb 06 2014 12:26:23 EDT Ventricular Rate:  74 PR Interval:    QRS Duration: 79 QT Interval:  396 QTC Calculation: 439 R Axis:   69 Text Interpretation:  Sinus rhythm Nonspecific T wave abnormality No  significant change since last tracing Confirmed by Ashok Cordia  MD, Lennette Bihari  ([id number]) on 02/06/2014 12:31:14 PM  MDM  Iv ns. Labs. Cxr.  Will try gi meds for symptom relief given ?hx gerd, and symptoms.   Reviewed nursing notes and prior charts for additional history.   pts symptoms constant for the past 12-14 hours, initial troponin neg.  ddimer normal. No recent trauma, immobility, surgery, or travel.  Non smoker.  Reviewed records, since initial PE associated w trip/immobility, no PE/DVT recurrent, and subsequent CT angio chest normal/negative.   Pt w prior normal stress test. No other chest discomfort, no hx exertional cp. Initial trop normal, repeat pending.   Pt states hungry. Po fluids/happy meal.  Second trop pending.  Recheck no increased wob, no wheezing. abd soft nt.   1620 repeat troponin pending - signed out to  Dr Darl Householder, to check when back, if normal and symptoms improved/resolved, anticipate probable discharge w close follow up.     Mirna Mires, MD 02/06/14 [phone]

## 2014-02-06 NOTE — ED Provider Notes (Signed)
  Physical Exam  BP 145/53  Pulse 77  Temp(Src) 98.4 F (36.9 C) (Oral)  Resp 18  SpO2 95%  Physical Exam  ED Course  Procedures  Care assumed at sign out from Dr. Ashok Cordia. Patient has some vague chest pain, shortness of breath. On coumadin for previous PE. INR therapeutic, d-dimer neg. Sign out pending second trop. Second trop nl. Pain free. Recommend outpatient workup. Recommend pepcid, prn tramadol.    Wandra Arthurs, MD 02/06/14 [phone]

## 2014-02-06 NOTE — ED Notes (Signed)
Pt ambulated to restroom with steady gait.

## 2014-02-06 NOTE — Discharge Instructions (Signed)
Take pepcid 20 mg twice a day as needed.   Take tylenol for pain. Take tramadol for severe pain.   Follow up with your doctor. You may need further workup if pain recurred.   Return to ER if you have severe pain, vomiting, worse chest pain, shortness of breath.

## 2014-02-06 NOTE — ED Notes (Signed)
She c/o central chest area discomfort and shortness of breath.  She states the shortness of breath is not new, in that she has recently been dx with "bronchitis".  Her skin is normal, warm and dry and she is in no distress.

## 2014-02-06 NOTE — ED Notes (Signed)
Provided pt with [place] sandwich, cheese, applesauce , and 178mL of water

## 2014-02-11 DIAGNOSIS — H35039 Hypertensive retinopathy, unspecified eye: Secondary | ICD-10-CM | POA: Diagnosis not present

## 2014-02-11 DIAGNOSIS — H353190 Nonexudative age-related macular degeneration, unspecified eye, stage unspecified: Secondary | ICD-10-CM | POA: Diagnosis not present

## 2014-02-11 DIAGNOSIS — Z961 Presence of intraocular lens: Secondary | ICD-10-CM | POA: Diagnosis not present

## 2014-02-11 DIAGNOSIS — H524 Presbyopia: Secondary | ICD-10-CM | POA: Diagnosis not present

## 2014-02-16 ENCOUNTER — Ambulatory Visit: Payer: Medicare Other | Admitting: Internal Medicine

## 2014-02-18 ENCOUNTER — Encounter: Payer: Self-pay | Admitting: Internal Medicine

## 2014-02-18 ENCOUNTER — Ambulatory Visit (INDEPENDENT_AMBULATORY_CARE_PROVIDER_SITE_OTHER): Payer: Medicare Other | Admitting: Internal Medicine

## 2014-02-18 VITALS — BP 150/62 | HR 81 | Temp 97.6°F | Ht 65.0 in | Wt 145.0 lb

## 2014-02-18 DIAGNOSIS — I10 Essential (primary) hypertension: Secondary | ICD-10-CM | POA: Diagnosis not present

## 2014-02-18 DIAGNOSIS — R7309 Other abnormal glucose: Secondary | ICD-10-CM | POA: Diagnosis not present

## 2014-02-18 DIAGNOSIS — R079 Chest pain, unspecified: Secondary | ICD-10-CM | POA: Diagnosis not present

## 2014-02-18 DIAGNOSIS — R7302 Impaired glucose tolerance (oral): Secondary | ICD-10-CM

## 2014-02-18 NOTE — Assessment & Plan Note (Signed)
stable overall by history and exam, recent data reviewed with pt, and pt to continue medical treatment as before,  to f/u any worsening symptoms or concerns BP Readings from Last 3 Encounters:  02/18/14 150/62  02/06/14 141/59  12/16/13 152/80

## 2014-02-18 NOTE — Progress Notes (Signed)
Subjective:    Patient ID: [name], female    DOB: 05/14/1930, 80 y.o.   MRN: [id number]  HPI  Here to f/u ER visit with atypical CP, trop I neg, as well as Ddimer, cbc, bmet, cxr without acute.  No further pain, did last > 30 min, squeezing, severe, sudden onset, radiated to back, ? Ate something wrong? Has not reecently been taking her PPI daily.  Last stress test 2012.  Pt denies polydipsia, polyuria, Pt denies chest pain, increased sob or doe, wheezing, orthopnea, PND, increased LE swelling, palpitations, dizziness or syncope.  Pt denies new neurological symptoms such as new headache, or facial or extremity weakness or numbness   Past Medical History  Diagnosis Date  . HYPERLIPIDEMIA   . ANXIETY   . DEPRESSION   . COMMON MIGRAINE   . Unspecified hearing loss   . HYPERTENSION   . CORONARY ARTERY DISEASE   . PULMONARY HYPERTENSION, SECONDARY   . CAROTID ARTERY STENOSIS, RIGHT   . Chronic rhinitis   . ALLERGIC RHINITIS   . COPD   . GERD   . DIVERTICULOSIS, COLON   . CYST/PSEUDOCYST, PANCREAS   . OVERACTIVE BLADDER   . OSTEOPOROSIS   . DIZZINESS, CHRONIC   . Pulmonary embolism 2008  . Bronchiectasis 12/11/2011    CT chest dx  . Esophageal stricture   . Complication of anesthesia     dizzy  . Stroke   . Tubular adenoma of colon    Past Surgical History  Procedure Laterality Date  . Abdominal hysterectomy  1965  . Egd  1999  . Cholecystectomy  1963  . Tonsillectomy    . Appendectomy  1963  . S/p sinus surgury    . Pancreatic cyst drainage      x 2  . Colonoscopy N/A 10/19/2013    Procedure: COLONOSCOPY;  Surgeon: Ladene Artist, MD;  Location: [facility];  Service: Endoscopy;  Laterality: N/A;    reports that she has never smoked. She has never used smokeless tobacco. She reports that she does not drink alcohol or use illicit drugs. family history includes Breast cancer in her daughter; Emphysema in her father; Heart disease in her mother; Lymphoma in her  sister; Stroke in her father. Allergies  Allergen Reactions  . Amoxicillin-Pot Clavulanate     REACTION: diarrhea  . Ciclesonide     REACTION: bad smell  . Ciprofloxacin Other (See Comments)    dizziness  . Raloxifene     REACTION: risk of recurrent stroke  . Sulfonamide Derivatives     REACTION: tongue swells   Current Outpatient Prescriptions on File Prior to Visit  Medication Sig Dispense Refill  . amLODipine (NORVASC) 5 MG tablet Take 5 mg by mouth daily.      [name] aspirin 81 MG EC tablet Take 81 mg by mouth daily.       . calcium carbonate (TUMS) 500 MG chewable tablet Chew 1 tablet by mouth daily as needed.        . Digestive Enzymes (PAPAYA AND ENZYMES) CHEW Chew 2 tablets by mouth 3 (three) times daily.       [name] loratadine (CLARITIN) 10 MG tablet Take 10 mg by mouth daily.        . meclizine (ANTIVERT) 12.5 MG tablet Take 12.5 mg by mouth 3 (three) times daily as needed for dizziness.      . mometasone (NASONEX) 50 MCG/ACT nasal spray Place 2 sprays into the nose daily as needed.       [name]  Multiple Vitamins-Minerals (MACUVITE) TABS Take 1 tablet by mouth daily.        . pantoprazole (PROTONIX) 40 MG tablet Take 40 mg by mouth daily.      . Probiotic Product (PROBIOTIC DAILY PO) Take 1 tablet by mouth daily.      . traMADol (ULTRAM) 50 MG tablet Take 1 tablet (50 mg total) by mouth every 6 (six) hours as needed.  15 tablet  0  . warfarin (COUMADIN) 5 MG tablet Take 2.5-5 mg by mouth daily. TAKES 1/2 TABLET EVERY DAY EXCEPT Monday AND Friday SHE TAKES 5 MG       No current facility-administered medications on file prior to visit.   Review of Systems  Constitutional: Negative for unusual diaphoresis or other sweats  HENT: Negative for ringing in ear Eyes: Negative for double vision or worsening visual disturbance.  Respiratory: Negative for choking and stridor.   Gastrointestinal: Negative for vomiting or other signifcant bowel change Genitourinary: Negative for hematuria or  decreased urine volume.  Musculoskeletal: Negative for other MSK pain or swelling Skin: Negative for color change and worsening wound.  Neurological: Negative for tremors and numbness other than noted  Psychiatric/Behavioral: Negative for decreased concentration or agitation other than above       Objective:   Physical Exam BP 150/62  Pulse 81  Temp(Src) 97.6 F (36.4 C) (Oral)  Ht 5\' 5"  (1.651 m)  Wt 145 lb (65.772 kg)  BMI 24.13 kg/m2  SpO2 92% VS noted,  Constitutional: Pt appears well-developed, well-nourished.  HENT: Head: NCAT.  Right Ear: External ear normal.  Left Ear: External ear normal.  Eyes: . Pupils are equal, round, and reactive to light. Conjunctivae and EOM are normal Neck: Normal range of motion. Neck supple.  Cardiovascular: Normal rate and regular rhythm.   Pulmonary/Chest: Effort normal and breath sounds decreased, no rales or wheezing  Abd:  Soft, NT, ND, + BS Neurological: Pt is alert. Not confused , motor grossly intact Skin: Skin is warm. No rash Psychiatric: Pt behavior is normal. No agitation.         Assessment & Plan:

## 2014-02-18 NOTE — Assessment & Plan Note (Signed)
stable overall by history and exam, recent data reviewed with pt, and pt to continue medical treatment as before,  to f/u any worsening symptoms or concerns Lab Results  Component Value Date   HGBA1C 5.8 05/26/2013    

## 2014-02-18 NOTE — Patient Instructions (Signed)
Please continue all other medications as before, and refills have been done if requested. Please have the pharmacy call with any other refills you may need.  You will be contacted regarding the referral for: stress test

## 2014-02-18 NOTE — Assessment & Plan Note (Signed)
Atypical, ecg findings, cxr , labs reviewed from may 2015 ER visit; eval neg so far, ? GI such as esoph spasm; to cont the PPI daily, consider ntg sl for this, but for rest lexiscan myoview

## 2014-02-18 NOTE — Progress Notes (Signed)
Pre visit review using our clinic review tool, if applicable. No additional management support is needed unless otherwise documented below in the visit note. 

## 2014-02-19 ENCOUNTER — Telehealth: Payer: Self-pay | Admitting: Internal Medicine

## 2014-02-19 NOTE — Telephone Encounter (Signed)
Relevant patient education mailed to patient.  

## 2014-03-03 ENCOUNTER — Telehealth: Payer: Self-pay | Admitting: Family Medicine

## 2014-03-03 ENCOUNTER — Ambulatory Visit (INDEPENDENT_AMBULATORY_CARE_PROVIDER_SITE_OTHER): Payer: Medicare Other | Admitting: Family Medicine

## 2014-03-03 DIAGNOSIS — Z5181 Encounter for therapeutic drug level monitoring: Secondary | ICD-10-CM | POA: Diagnosis not present

## 2014-03-03 DIAGNOSIS — Z7901 Long term (current) use of anticoagulants: Secondary | ICD-10-CM | POA: Diagnosis not present

## 2014-03-03 DIAGNOSIS — I2699 Other pulmonary embolism without acute cor pulmonale: Secondary | ICD-10-CM

## 2014-03-03 DIAGNOSIS — R079 Chest pain, unspecified: Secondary | ICD-10-CM

## 2014-03-03 LAB — POCT INR: INR: 1.8

## 2014-03-03 NOTE — Telephone Encounter (Signed)
I have reordered. 

## 2014-03-03 NOTE — Telephone Encounter (Signed)
Pt saw Dr. [name] on 5/14 for hospital f/u and she said that he put in a referral for her to have a stress test.  She has not heard from anyone and wants to check on the status.

## 2014-03-03 NOTE — Addendum Note (Signed)
Addended by: [name] on: 03/03/2014 08:02 PM   Modules accepted: Orders

## 2014-03-04 NOTE — Telephone Encounter (Signed)
Patient informed of referral

## 2014-03-20 ENCOUNTER — Ambulatory Visit (INDEPENDENT_AMBULATORY_CARE_PROVIDER_SITE_OTHER): Payer: Medicare Other | Admitting: Family Medicine

## 2014-03-20 ENCOUNTER — Encounter: Payer: Self-pay | Admitting: Family Medicine

## 2014-03-20 VITALS — BP 160/80 | HR 75 | Temp 98.4°F | Resp 20 | Ht 65.0 in | Wt 159.0 lb

## 2014-03-20 DIAGNOSIS — J019 Acute sinusitis, unspecified: Secondary | ICD-10-CM

## 2014-03-20 MED ORDER — BENZONATATE 200 MG PO CAPS: 200.0000 mg | ORAL_CAPSULE | Freq: Three times a day (TID) | ORAL | Status: DC | PRN

## 2014-03-20 MED ORDER — AMOXICILLIN 875 MG PO TABS: 875.0000 mg | ORAL_TABLET | Freq: Two times a day (BID) | ORAL | Status: DC

## 2014-03-20 NOTE — Progress Notes (Signed)
   Subjective:    Patient ID: [name], female    DOB: [birth year], 80 y.o.   MRN: [id number]  Cough Associated symptoms include ear pain and headaches.  Headache  Associated symptoms include coughing and ear pain.  Otalgia  Associated symptoms include coughing and headaches.   URI- sxs started ~10 days ago.  Now w/ R ear pain, cough, nasal congestion, sore throat.  + R sided upper tooth pain.  No fevers.  No N/V/D.  + HA.  + PND.  No known sick contacts.   Review of Systems  HENT: Positive for ear pain.   Respiratory: Positive for cough.   Neurological: Positive for headaches.   For ROS see HPI     Objective:   Physical Exam  Vitals reviewed. Constitutional: She appears well-developed and well-nourished. No distress.  HENT:  Head: Normocephalic and atraumatic.  Right Ear: Tympanic membrane normal.  Left Ear: Tympanic membrane normal.  Nose: Mucosal edema and rhinorrhea present. Right sinus exhibits maxillary sinus tenderness and frontal sinus tenderness. Left sinus exhibits no maxillary sinus tenderness and no frontal sinus tenderness.  Mouth/Throat: Uvula is midline and mucous membranes are normal. Posterior oropharyngeal erythema present. No oropharyngeal exudate.  Eyes: Conjunctivae and EOM are normal. Pupils are equal, round, and reactive to light.  Neck: Normal range of motion. Neck supple.  Cardiovascular: Normal rate, regular rhythm and normal heart sounds.   Pulmonary/Chest: Effort normal and breath sounds normal. No respiratory distress. She has no wheezes.  Lymphadenopathy:    She has no cervical adenopathy.          Assessment & Plan:

## 2014-03-20 NOTE — Progress Notes (Signed)
Pre-visit discussion using our clinic review tool. No additional management support is needed unless otherwise documented below in the visit note.  

## 2014-03-20 NOTE — Patient Instructions (Signed)
Follow up as needed Start the Amoxicillin twice daily- take w/ food Drink plenty of fluids REST! Use the cough pills as needed Continue your Claritin daily Call with any questions or concerns Hang in there!!!

## 2014-03-20 NOTE — Assessment & Plan Note (Signed)
Pt's sxs and PE consistent w/ infxn.  Start Amox- pt reports she thinks she has taken this w/o difficulty in the past despite intolerance to Augmentin.  Cough meds prn.  Reviewed supportive care and red flags that should prompt return.  Pt expressed understanding and is in agreement w/ plan.

## 2014-04-02 ENCOUNTER — Ambulatory Visit (INDEPENDENT_AMBULATORY_CARE_PROVIDER_SITE_OTHER): Payer: Medicare Other | Admitting: General Practice

## 2014-04-02 DIAGNOSIS — I2699 Other pulmonary embolism without acute cor pulmonale: Secondary | ICD-10-CM

## 2014-04-02 DIAGNOSIS — Z7901 Long term (current) use of anticoagulants: Secondary | ICD-10-CM

## 2014-04-02 DIAGNOSIS — Z5181 Encounter for therapeutic drug level monitoring: Secondary | ICD-10-CM | POA: Diagnosis not present

## 2014-04-02 LAB — POCT INR: INR: 1.7

## 2014-04-02 NOTE — Progress Notes (Signed)
Pre visit review using our clinic review tool, if applicable. No additional management support is needed unless otherwise documented below in the visit note. 

## 2014-04-08 DIAGNOSIS — R269 Unspecified abnormalities of gait and mobility: Secondary | ICD-10-CM | POA: Diagnosis not present

## 2014-04-08 DIAGNOSIS — R42 Dizziness and giddiness: Secondary | ICD-10-CM | POA: Diagnosis not present

## 2014-04-20 ENCOUNTER — Ambulatory Visit (HOSPITAL_COMMUNITY): Payer: Medicare Other | Attending: Cardiology | Admitting: Radiology

## 2014-04-20 VITALS — BP 151/67 | Ht 65.0 in | Wt 159.0 lb

## 2014-04-20 DIAGNOSIS — I2510 Atherosclerotic heart disease of native coronary artery without angina pectoris: Secondary | ICD-10-CM | POA: Insufficient documentation

## 2014-04-20 DIAGNOSIS — I739 Peripheral vascular disease, unspecified: Secondary | ICD-10-CM | POA: Insufficient documentation

## 2014-04-20 DIAGNOSIS — Z8249 Family history of ischemic heart disease and other diseases of the circulatory system: Secondary | ICD-10-CM | POA: Diagnosis not present

## 2014-04-20 DIAGNOSIS — I10 Essential (primary) hypertension: Secondary | ICD-10-CM | POA: Diagnosis not present

## 2014-04-20 DIAGNOSIS — R079 Chest pain, unspecified: Secondary | ICD-10-CM | POA: Diagnosis not present

## 2014-04-20 MED ORDER — REGADENOSON 0.4 MG/5ML IV SOLN
0.4000 mg | Freq: Once | INTRAVENOUS | Status: AC
  Administered 2014-04-20: 0.4 mg via INTRAVENOUS

## 2014-04-20 MED ORDER — TECHNETIUM TC 99M SESTAMIBI GENERIC - CARDIOLITE
11.0000 | Freq: Once | INTRAVENOUS | Status: AC | PRN
  Administered 2014-04-20: 11 via INTRAVENOUS

## 2014-04-20 MED ORDER — TECHNETIUM TC 99M SESTAMIBI GENERIC - CARDIOLITE
33.0000 | Freq: Once | INTRAVENOUS | Status: AC | PRN
  Administered 2014-04-20: 33 via INTRAVENOUS

## 2014-04-20 NOTE — Progress Notes (Signed)
  [facility] [address] [place], [place] [address] [phone]    Cardiology Nuclear Med Study  [name] is a 80 y.o. female     MRN : [id number]     DOB: 1929/12/21  Procedure Date: 04/20/2014  Nuclear Med Background Indication for Stress Test:  Evaluation for Ischemia and Hospital on 5/15 with Chest Pain and (-) Troponins History:  CAD-'12 MPI: EF: 84-CARDIAC CT- '13 ECHO: EF: 55-60% Cardiac Risk Factors: Carotid Disease, CVA, Family History - CAD, Hypertension, Lipids and PVD  Symptoms:  Chest Pain   Nuclear Pre-Procedure Caffeine/Decaff Intake:  None NPO After: 7:00am   Lungs:  clear O2 Sat: 95% on room air. IV 0.9% NS with Angio Cath:  22g  IV Site: R Hand  IV Started by:  Matilde Haymaker, RN  Chest Size (in):  36 Cup Size: B  Height: 5\' 5"  (1.651 m)  Weight:  159 lb (72.122 kg)  BMI:  Body mass index is 26.46 kg/(m^2). Tech Comments:  n/a    Nuclear Med Study 1 or 2 day study: 1 day  Stress Test Type:  Lexiscan  Reading MD: N/A  Order Authorizing Provider:  Waynard Edwards  Resting Radionuclide: Technetium 51m Sestamibi  Resting Radionuclide Dose: 11.0 mCi   Stress Radionuclide:  Technetium 8m Sestamibi  Stress Radionuclide Dose: 33.0 mCi           Stress Protocol Rest HR: 68  Stress HR: 90  Rest BP: 151/67 Stress BP: 161/57  Exercise Time (min): n/a METS: n/a   Predicted Max HR: 138 bpm % Max HR: 65.22 bpm Rate Pressure Product: 14490   Dose of Adenosine (mg):  n/a Dose of Lexiscan: 0.4 mg  Dose of Atropine (mg): n/a Dose of Dobutamine: n/a mcg/kg/min (at max HR)  Stress Test Technologist: Perrin Maltese, EMT-P  Nuclear Technologist:  Vedia Pereyra, CNMT     Rest Procedure:  Myocardial perfusion imaging was performed at rest 45 minutes following the intravenous administration of Technetium 30m Sestamibi. Rest ECG: NSR - Normal EKG  Stress Procedure:  The patient received IV Lexiscan 0.4 mg over 15-seconds.   Technetium 73m Sestamibi injected at 30-seconds. This patient had throat and leg tightness, sob, and abdominal discomfort with the Lexiscan injection. Quantitative spect images were obtained after a 45 minute delay. Stress ECG: No significant change from baseline ECG  QPS Raw Data Images:  Normal; no motion artifact; normal heart/lung ratio. Stress Images:  Normal homogeneous uptake in all areas of the myocardium. Rest Images:  Normal homogeneous uptake in all areas of the myocardium. Subtraction (SDS):  No evidence of ischemia. Transient Ischemic Dilatation (Normal <1.22):  1.42 Lung/Heart Ratio (Normal <0.45):  0.31  Quantitative Gated Spect Images QGS EDV:  55 ml QGS ESV:  13 ml  Impression Exercise Capacity:  Lexiscan with no exercise. BP Response:  Normal blood pressure response. Clinical Symptoms:  Throat tightness, dyspnea, abdominal discomfort ECG Impression:  No significant ECG changes with Lexiscan. Comparison with Prior Nuclear Study: No significant change from previous study  Overall Impression:  Low risk stress nuclear study with normal perfusion. TID, however, is abnormal at 1.42, which could indicate global ischemia.  LV Ejection Fraction: 76%.  LV Wall Motion:  NL LV Function; NL Wall Motion  Pixie Casino, MD, [facility] Board Certified in Nuclear Cardiology Attending Cardiologist [place]

## 2014-04-23 ENCOUNTER — Ambulatory Visit: Payer: Medicare Other | Attending: Otolaryngology | Admitting: Rehabilitative and Restorative Service Providers"

## 2014-04-23 DIAGNOSIS — R269 Unspecified abnormalities of gait and mobility: Secondary | ICD-10-CM | POA: Insufficient documentation

## 2014-04-23 DIAGNOSIS — IMO0001 Reserved for inherently not codable concepts without codable children: Secondary | ICD-10-CM | POA: Insufficient documentation

## 2014-04-23 DIAGNOSIS — R42 Dizziness and giddiness: Secondary | ICD-10-CM | POA: Diagnosis not present

## 2014-04-28 ENCOUNTER — Telehealth: Payer: Self-pay | Admitting: *Deleted

## 2014-04-28 NOTE — Telephone Encounter (Signed)
I can see the imaging test results, but i cannot find the writtent report/interpretation; please ask pt to call for results to the stress testing center, as for some reason the final results are not yet available it seems

## 2014-04-28 NOTE — Telephone Encounter (Signed)
Pt is wanting results back from her stress test done last week...[name]

## 2014-04-29 ENCOUNTER — Ambulatory Visit: Payer: Medicare Other | Admitting: Rehabilitative and Restorative Service Providers"

## 2014-04-29 DIAGNOSIS — IMO0001 Reserved for inherently not codable concepts without codable children: Secondary | ICD-10-CM | POA: Diagnosis not present

## 2014-04-29 NOTE — Telephone Encounter (Signed)
Called cardiology spoke with [name] the report was under encounter dated 04/20/14. Printed report out place on md desk to review...[name]

## 2014-04-29 NOTE — Telephone Encounter (Signed)
Report reveiwed, ok to let pt know, Stress test is negative for ischemia (good blood flow to heart with exercise), no need for further eval and tx based on this result

## 2014-04-29 NOTE — Telephone Encounter (Signed)
Called pt no answer [name] RTC.../lmb 

## 2014-04-29 NOTE — Telephone Encounter (Signed)
Pt return call gave md response concerning results...[name]

## 2014-04-30 ENCOUNTER — Ambulatory Visit (INDEPENDENT_AMBULATORY_CARE_PROVIDER_SITE_OTHER): Payer: Medicare Other | Admitting: General Practice

## 2014-04-30 DIAGNOSIS — Z5181 Encounter for therapeutic drug level monitoring: Secondary | ICD-10-CM | POA: Diagnosis not present

## 2014-04-30 DIAGNOSIS — Z7901 Long term (current) use of anticoagulants: Secondary | ICD-10-CM

## 2014-04-30 DIAGNOSIS — I2699 Other pulmonary embolism without acute cor pulmonale: Secondary | ICD-10-CM | POA: Diagnosis not present

## 2014-04-30 LAB — POCT INR: INR: 2

## 2014-04-30 NOTE — Progress Notes (Signed)
Pre visit review using our clinic review tool, if applicable. No additional management support is needed unless otherwise documented below in the visit note. 

## 2014-05-14 ENCOUNTER — Encounter: Payer: Medicare Other | Admitting: Rehabilitative and Restorative Service Providers"

## 2014-05-18 ENCOUNTER — Encounter: Payer: Medicare Other | Admitting: Rehabilitative and Restorative Service Providers"

## 2014-05-31 ENCOUNTER — Ambulatory Visit: Payer: Medicare Other | Attending: Otolaryngology | Admitting: Rehabilitative and Restorative Service Providers"

## 2014-05-31 DIAGNOSIS — R269 Unspecified abnormalities of gait and mobility: Secondary | ICD-10-CM | POA: Diagnosis not present

## 2014-05-31 DIAGNOSIS — R42 Dizziness and giddiness: Secondary | ICD-10-CM | POA: Diagnosis not present

## 2014-05-31 DIAGNOSIS — IMO0001 Reserved for inherently not codable concepts without codable children: Secondary | ICD-10-CM | POA: Insufficient documentation

## 2014-06-01 ENCOUNTER — Ambulatory Visit (INDEPENDENT_AMBULATORY_CARE_PROVIDER_SITE_OTHER): Payer: Medicare Other | Admitting: *Deleted

## 2014-06-01 DIAGNOSIS — Z5181 Encounter for therapeutic drug level monitoring: Secondary | ICD-10-CM

## 2014-06-01 DIAGNOSIS — I2699 Other pulmonary embolism without acute cor pulmonale: Secondary | ICD-10-CM

## 2014-06-01 DIAGNOSIS — Z7901 Long term (current) use of anticoagulants: Secondary | ICD-10-CM

## 2014-06-01 LAB — POCT INR: INR: 4.1

## 2014-06-02 ENCOUNTER — Encounter: Payer: Medicare Other | Admitting: Rehabilitative and Restorative Service Providers"

## 2014-06-16 ENCOUNTER — Ambulatory Visit: Payer: Medicare Other | Attending: Otolaryngology | Admitting: Rehabilitative and Restorative Service Providers"

## 2014-06-16 DIAGNOSIS — IMO0001 Reserved for inherently not codable concepts without codable children: Secondary | ICD-10-CM | POA: Diagnosis not present

## 2014-06-16 DIAGNOSIS — R269 Unspecified abnormalities of gait and mobility: Secondary | ICD-10-CM | POA: Insufficient documentation

## 2014-06-16 DIAGNOSIS — R42 Dizziness and giddiness: Secondary | ICD-10-CM | POA: Insufficient documentation

## 2014-06-28 DIAGNOSIS — R7981 Abnormal blood-gas level: Secondary | ICD-10-CM | POA: Diagnosis not present

## 2014-06-29 ENCOUNTER — Encounter: Payer: Medicare Other | Admitting: *Deleted

## 2014-06-29 ENCOUNTER — Ambulatory Visit: Payer: Medicare Other | Admitting: *Deleted

## 2014-06-29 DIAGNOSIS — I2699 Other pulmonary embolism without acute cor pulmonale: Secondary | ICD-10-CM

## 2014-06-29 DIAGNOSIS — Z7901 Long term (current) use of anticoagulants: Secondary | ICD-10-CM

## 2014-06-29 DIAGNOSIS — Z5181 Encounter for therapeutic drug level monitoring: Secondary | ICD-10-CM

## 2014-06-29 LAB — POCT INR: INR: 3.8

## 2014-06-30 ENCOUNTER — Ambulatory Visit (INDEPENDENT_AMBULATORY_CARE_PROVIDER_SITE_OTHER): Payer: Medicare Other | Admitting: *Deleted

## 2014-06-30 DIAGNOSIS — Z5181 Encounter for therapeutic drug level monitoring: Secondary | ICD-10-CM | POA: Diagnosis not present

## 2014-06-30 DIAGNOSIS — Z7901 Long term (current) use of anticoagulants: Secondary | ICD-10-CM | POA: Diagnosis not present

## 2014-06-30 DIAGNOSIS — I2699 Other pulmonary embolism without acute cor pulmonale: Secondary | ICD-10-CM

## 2014-07-01 ENCOUNTER — Encounter: Payer: Self-pay | Admitting: Internal Medicine

## 2014-07-01 ENCOUNTER — Ambulatory Visit (INDEPENDENT_AMBULATORY_CARE_PROVIDER_SITE_OTHER): Payer: Medicare Other | Admitting: Internal Medicine

## 2014-07-01 VITALS — BP 150/98 | HR 77 | Temp 98.3°F | Wt 161.8 lb

## 2014-07-01 DIAGNOSIS — J069 Acute upper respiratory infection, unspecified: Secondary | ICD-10-CM

## 2014-07-01 DIAGNOSIS — J479 Bronchiectasis, uncomplicated: Secondary | ICD-10-CM

## 2014-07-01 DIAGNOSIS — I10 Essential (primary) hypertension: Secondary | ICD-10-CM | POA: Diagnosis not present

## 2014-07-01 MED ORDER — MECLIZINE HCL 12.5 MG PO TABS: 12.5000 mg | ORAL_TABLET | Freq: Three times a day (TID) | ORAL | Status: DC | PRN

## 2014-07-01 MED ORDER — AZITHROMYCIN 250 MG PO TABS: ORAL_TABLET | ORAL | Status: DC

## 2014-07-01 NOTE — Progress Notes (Signed)
Pre visit review using our clinic review tool, if applicable. No additional management support is needed unless otherwise documented below in the visit note. 

## 2014-07-01 NOTE — Patient Instructions (Signed)
Please take all new medication as prescribed  Please continue all other medications as before, and refills have been done if requested.  Please have the pharmacy call with any other refills you may need.  Please continue your efforts at being more active, low cholesterol diet, and weight control.  Please keep your appointments with your specialists as you may have planned    

## 2014-07-01 NOTE — Progress Notes (Signed)
Subjective:    Patient ID: [name], female    DOB: 1930/04/21, 80 y.o.   MRN: [id number]  HPI   Here with 2-3 days acute onset fever, severe ST, pressure, headache, general weakness and malaise, and greenish d/c, with mild ST and cough, but pt denies chest pain, wheezing, increased sob or doe, orthopnea, PND, increased LE swelling, palpitations, dizziness or syncope. Past Medical History  Diagnosis Date  . HYPERLIPIDEMIA   . ANXIETY   . DEPRESSION   . COMMON MIGRAINE   . Unspecified hearing loss   . HYPERTENSION   . CORONARY ARTERY DISEASE   . PULMONARY HYPERTENSION, SECONDARY   . CAROTID ARTERY STENOSIS, RIGHT   . Chronic rhinitis   . ALLERGIC RHINITIS   . COPD   . GERD   . DIVERTICULOSIS, COLON   . CYST/PSEUDOCYST, PANCREAS   . OVERACTIVE BLADDER   . OSTEOPOROSIS   . DIZZINESS, CHRONIC   . Pulmonary embolism 2008  . Bronchiectasis 12/11/2011    CT chest dx  . Esophageal stricture   . Complication of anesthesia     dizzy  . Stroke   . Tubular adenoma of colon    Past Surgical History  Procedure Laterality Date  . Abdominal hysterectomy  1965  . Egd  1999  . Cholecystectomy  1963  . Tonsillectomy    . Appendectomy  1963  . S/p sinus surgury    . Pancreatic cyst drainage      x 2  . Colonoscopy N/A 10/19/2013    Procedure: COLONOSCOPY;  Surgeon: Ladene Artist, MD;  Location: [facility];  Service: Endoscopy;  Laterality: N/A;    reports that she has never smoked. She has never used smokeless tobacco. She reports that she does not drink alcohol or use illicit drugs. family history includes Breast cancer in her daughter; Emphysema in her father; Heart disease in her mother; Lymphoma in her sister; Stroke in her father. Allergies  Allergen Reactions  . Amoxicillin-Pot Clavulanate     REACTION: diarrhea  . Ciclesonide     REACTION: bad smell  . Ciprofloxacin Other (See Comments)    dizziness  . Raloxifene     REACTION: risk of recurrent stroke  .  Sulfonamide Derivatives     REACTION: tongue swells   Current Outpatient Prescriptions on File Prior to Visit  Medication Sig Dispense Refill  . amLODipine (NORVASC) 5 MG tablet Take 5 mg by mouth daily.      [name] aspirin 81 MG EC tablet Take 81 mg by mouth daily.       . calcium carbonate (TUMS) 500 MG chewable tablet Chew 1 tablet by mouth daily as needed.        . Digestive Enzymes (PAPAYA AND ENZYMES) CHEW Chew 2 tablets by mouth 3 (three) times daily.       [name] loratadine (CLARITIN) 10 MG tablet Take 10 mg by mouth daily.        [name] losartan (COZAAR) 100 MG tablet Take 100 mg by mouth daily.       . mometasone (NASONEX) 50 MCG/ACT nasal spray Place 2 sprays into the nose daily as needed.       . Multiple Vitamins-Minerals (MACUVITE) TABS Take 1 tablet by mouth daily.        . pantoprazole (PROTONIX) 40 MG tablet Take 40 mg by mouth daily.      . Probiotic Product (PROBIOTIC DAILY PO) Take 1 tablet by mouth daily.      [name]  warfarin (COUMADIN) 5 MG tablet Take 2.5-5 mg by mouth daily. TAKES 1/2 TABLET EVERY DAY EXCEPT Monday AND Friday SHE TAKES 5 MG       No current facility-administered medications on file prior to visit.   Review of Systems  Constitutional: Negative for unusual diaphoresis or other sweats  HENT: Negative for ringing in ear Eyes: Negative for double vision or worsening visual disturbance.  Respiratory: Negative for choking and stridor.   Gastrointestinal: Negative for vomiting or other signifcant bowel change Genitourinary: Negative for hematuria or decreased urine volume.  Musculoskeletal: Negative for other MSK pain or swelling Skin: Negative for color change and worsening wound.  Neurological: Negative for tremors and numbness other than noted  Psychiatric/Behavioral: Negative for decreased concentration or agitation other than above       Objective:   Physical Exam BP 150/98  Pulse 77  Temp(Src) 98.3 F (36.8 C) (Oral)  Wt 161 lb 12 oz (73.369 kg)  SpO2 93% VS  noted, mild ill Constitutional: Pt appears well-developed, well-nourished.  HENT: Head: NCAT.  Right Ear: External ear normal.  Left Ear: External ear normal.  Eyes: . Pupils are equal, round, and reactive to light. Conjunctivae and EOM are normal Bilat tm's with mild erythema.  Max sinus areas mild tender.  Pharynx with mild erythema, no exudate Neck: Normal range of motion. Neck supple.  Cardiovascular: Normal rate and regular rhythm.   Pulmonary/Chest: Effort normal and breath sounds without wheeze Neurological: Pt is alert. Not confused , motor grossly intact Skin: Skin is warm. No rash Psychiatric: Pt behavior is normal. No agitation.     Assessment & Plan:

## 2014-07-03 NOTE — Assessment & Plan Note (Signed)
Mild to mod, for antibx course,  to f/u any worsening symptoms or concerns 

## 2014-07-03 NOTE — Assessment & Plan Note (Signed)
stable overall by history and exam, and pt to continue medical treatment as before,  to f/u any worsening symptoms or concerns 

## 2014-07-03 NOTE — Assessment & Plan Note (Signed)
Mild elev, stable overall by history and exam, recent data reviewed with pt, and pt to continue medical tx, declines med change,  to f/u any worsening symptoms or concerns BP Readings from Last 3 Encounters:  07/01/14 150/98  04/20/14 151/67  03/20/14 160/80

## 2014-07-16 ENCOUNTER — Other Ambulatory Visit: Payer: Self-pay | Admitting: Internal Medicine

## 2014-07-16 ENCOUNTER — Other Ambulatory Visit: Payer: Self-pay

## 2014-07-16 MED ORDER — LOSARTAN POTASSIUM 100 MG PO TABS: 100.0000 mg | ORAL_TABLET | Freq: Every day | ORAL | Status: DC

## 2014-07-27 ENCOUNTER — Ambulatory Visit: Payer: Medicare Other | Admitting: Internal Medicine

## 2014-07-27 ENCOUNTER — Ambulatory Visit (INDEPENDENT_AMBULATORY_CARE_PROVIDER_SITE_OTHER): Payer: Medicare Other | Admitting: *Deleted

## 2014-07-27 DIAGNOSIS — Z23 Encounter for immunization: Secondary | ICD-10-CM | POA: Diagnosis not present

## 2014-07-27 DIAGNOSIS — Z5181 Encounter for therapeutic drug level monitoring: Secondary | ICD-10-CM | POA: Diagnosis not present

## 2014-07-27 DIAGNOSIS — I2699 Other pulmonary embolism without acute cor pulmonale: Secondary | ICD-10-CM | POA: Diagnosis not present

## 2014-07-27 DIAGNOSIS — Z7901 Long term (current) use of anticoagulants: Secondary | ICD-10-CM | POA: Diagnosis not present

## 2014-07-27 LAB — POCT INR: INR: 3.4

## 2014-08-03 ENCOUNTER — Ambulatory Visit (INDEPENDENT_AMBULATORY_CARE_PROVIDER_SITE_OTHER): Payer: Medicare Other | Admitting: Internal Medicine

## 2014-08-03 ENCOUNTER — Encounter: Payer: Self-pay | Admitting: Internal Medicine

## 2014-08-03 VITALS — BP 142/72 | HR 87 | Temp 98.1°F | Wt 160.5 lb

## 2014-08-03 DIAGNOSIS — J471 Bronchiectasis with (acute) exacerbation: Secondary | ICD-10-CM

## 2014-08-03 DIAGNOSIS — J9611 Chronic respiratory failure with hypoxia: Secondary | ICD-10-CM | POA: Diagnosis not present

## 2014-08-03 DIAGNOSIS — I10 Essential (primary) hypertension: Secondary | ICD-10-CM

## 2014-08-03 DIAGNOSIS — R7302 Impaired glucose tolerance (oral): Secondary | ICD-10-CM

## 2014-08-03 MED ORDER — LEVOFLOXACIN 250 MG PO TABS: 250.0000 mg | ORAL_TABLET | Freq: Every day | ORAL | Status: DC

## 2014-08-03 MED ORDER — HYDROCOD POLST-CHLORPHEN POLST 10-8 MG/5ML PO LQCR: 5.0000 mL | Freq: Two times a day (BID) | ORAL | Status: DC | PRN

## 2014-08-03 NOTE — Progress Notes (Signed)
Subjective:    Patient ID: [name], female    DOB: 1930-04-16, 80 y.o.   MRN: [id number]  HPI  Here with acute onset mild to mod 2-3 days ST, HA, general weakness and malaise, with prod cough greenish sputum, but Pt denies chest pain, increased sob or doe, wheezing, orthopnea, PND, increased LE swelling, palpitations, dizziness or syncope.  Not wearing o2 today, supposed to be continuous use, but tank too heavy.  Pt denies polydipsia, polyuria, Past Medical History  Diagnosis Date  . HYPERLIPIDEMIA   . ANXIETY   . DEPRESSION   . COMMON MIGRAINE   . Unspecified hearing loss   . HYPERTENSION   . CORONARY ARTERY DISEASE   . PULMONARY HYPERTENSION, SECONDARY   . CAROTID ARTERY STENOSIS, RIGHT   . Chronic rhinitis   . ALLERGIC RHINITIS   . COPD   . GERD   . DIVERTICULOSIS, COLON   . CYST/PSEUDOCYST, PANCREAS   . OVERACTIVE BLADDER   . OSTEOPOROSIS   . DIZZINESS, CHRONIC   . Pulmonary embolism 2008  . Bronchiectasis 12/11/2011    CT chest dx  . Esophageal stricture   . Complication of anesthesia     dizzy  . Stroke   . Tubular adenoma of colon    Past Surgical History  Procedure Laterality Date  . Abdominal hysterectomy  1965  . Egd  1999  . Cholecystectomy  1963  . Tonsillectomy    . Appendectomy  1963  . S/p sinus surgury    . Pancreatic cyst drainage      x 2  . Colonoscopy N/A 10/19/2013    Procedure: COLONOSCOPY;  Surgeon: Ladene Artist, MD;  Location: [facility];  Service: Endoscopy;  Laterality: N/A;    reports that she has never smoked. She has never used smokeless tobacco. She reports that she does not drink alcohol or use illicit drugs. family history includes Breast cancer in her daughter; Emphysema in her father; Heart disease in her mother; Lymphoma in her sister; Stroke in her father. Allergies  Allergen Reactions  . Amoxicillin-Pot Clavulanate     REACTION: diarrhea  . Ciclesonide     REACTION: bad smell  . Ciprofloxacin Other (See Comments)     dizziness  . Raloxifene     REACTION: risk of recurrent stroke  . Sulfonamide Derivatives     REACTION: tongue swells   Current Outpatient Prescriptions on File Prior to Visit  Medication Sig Dispense Refill  . amLODipine (NORVASC) 5 MG tablet TAKE 1 TABLET EVERY DAY  90 tablet  3  . aspirin 81 MG EC tablet Take 81 mg by mouth daily.       [name] azithromycin (ZITHROMAX Z-PAK) 250 MG tablet Use as directed  6 tablet  1  . calcium carbonate (TUMS) 500 MG chewable tablet Chew 1 tablet by mouth daily as needed.        . Digestive Enzymes (PAPAYA AND ENZYMES) CHEW Chew 2 tablets by mouth 3 (three) times daily.       [name] loratadine (CLARITIN) 10 MG tablet Take 10 mg by mouth daily.        [name] losartan (COZAAR) 100 MG tablet Take 1 tablet (100 mg total) by mouth daily.  90 tablet  3  . meclizine (ANTIVERT) 12.5 MG tablet Take 1 tablet (12.5 mg total) by mouth 3 (three) times daily as needed for dizziness.  40 tablet  3  . mometasone (NASONEX) 50 MCG/ACT nasal spray Place 2 sprays into the  nose daily as needed.       . Multiple Vitamins-Minerals (MACUVITE) TABS Take 1 tablet by mouth daily.        . pantoprazole (PROTONIX) 40 MG tablet TAKE 1 TABLET EVERY DAY  90 tablet  3  . Probiotic Product (PROBIOTIC DAILY PO) Take 1 tablet by mouth daily.      [name] warfarin (COUMADIN) 5 MG tablet Take 2.5-5 mg by mouth daily. TAKES 1/2 TABLET EVERY DAY EXCEPT Monday AND Friday SHE TAKES 5 MG       No current facility-administered medications on file prior to visit.   Review of Systems  Constitutional: Negative for unusual diaphoresis or other sweats  HENT: Negative for ringing in ear Eyes: Negative for double vision or worsening visual disturbance.  Respiratory: Negative for choking and stridor.   Gastrointestinal: Negative for vomiting or other signifcant bowel change Genitourinary: Negative for hematuria or decreased urine volume.  Musculoskeletal: Negative for other MSK pain or swelling Skin: Negative for  color change and worsening wound.  Neurological: Negative for tremors and numbness other than noted  Psychiatric/Behavioral: Negative for decreased concentration or agitation other than above       Objective:   Physical Exam BP 142/72  Pulse 87  Temp(Src) 98.1 F (36.7 C) (Oral)  Wt 160 lb 8 oz (72.802 kg)  SpO2 91% VS noted,  Constitutional: Pt appears well-developed, well-nourished.  HENT: Head: NCAT.  Right Ear: External ear normal.  Left Ear: External ear normal.  Eyes: . Pupils are equal, round, and reactive to light. Conjunctivae and EOM are normal Neck: Normal range of motion. Neck supple.  Cardiovascular: Normal rate and regular rhythm.   Pulmonary/Chest: Effort normal and breath sounds decreased with bilat crackles  Abd:  Soft, NT, ND, + BS Neurological: Pt is alert. Not confused , motor grossly intact Skin: Skin is warm. No rash Psychiatric: Pt behavior is normal. No agitation.     Assessment & Plan:

## 2014-08-03 NOTE — Patient Instructions (Addendum)
Please take all new medication as prescribed - the antibiotic, and cough medicine  Please continue all other medications as before  Please have the pharmacy call with any other refills you may need.  Please keep your appointments with your specialists as you may have planned  Please return in 6 months, or sooner if needed

## 2014-08-03 NOTE — Progress Notes (Signed)
Pre visit review using our clinic review tool, if applicable. No additional management support is needed unless otherwise documented below in the visit note. 

## 2014-08-04 ENCOUNTER — Ambulatory Visit: Payer: Medicare Other | Admitting: Gastroenterology

## 2014-08-07 NOTE — Assessment & Plan Note (Signed)
Pt encouraged compliance with home o2,  to f/u any worsening symptoms or concerns

## 2014-08-07 NOTE — Assessment & Plan Note (Signed)
stable overall by history and exam, recent data reviewed with pt, and pt to continue medical treatment as before,  to f/u any worsening symptoms or concerns BP Readings from Last 3 Encounters:  08/03/14 142/72  07/01/14 150/98  04/20/14 151/67

## 2014-08-07 NOTE — Assessment & Plan Note (Signed)
Mild to mod, for antibx course,  to f/u any worsening symptoms or concerns 

## 2014-08-07 NOTE — Assessment & Plan Note (Signed)
stable overall by history and exam, recent data reviewed with pt, and pt to continue medical treatment as before,  to f/u any worsening symptoms or concerns Lab Results  Component Value Date   HGBA1C 5.8 05/26/2013

## 2014-08-24 ENCOUNTER — Telehealth: Payer: Self-pay | Admitting: Family

## 2014-08-24 ENCOUNTER — Ambulatory Visit (INDEPENDENT_AMBULATORY_CARE_PROVIDER_SITE_OTHER): Payer: Medicare Other

## 2014-08-24 DIAGNOSIS — I2699 Other pulmonary embolism without acute cor pulmonale: Secondary | ICD-10-CM | POA: Diagnosis not present

## 2014-08-24 DIAGNOSIS — Z5181 Encounter for therapeutic drug level monitoring: Secondary | ICD-10-CM | POA: Diagnosis not present

## 2014-08-24 DIAGNOSIS — Z7901 Long term (current) use of anticoagulants: Secondary | ICD-10-CM | POA: Diagnosis not present

## 2014-08-24 LAB — POCT INR: INR: 3.5

## 2014-08-24 NOTE — Telephone Encounter (Signed)
Tracker adjusted.  Thanks.

## 2014-08-24 NOTE — Telephone Encounter (Signed)
Please see note below. 

## 2014-08-24 NOTE — Telephone Encounter (Signed)
Agree with dosage change - 1/2 pill daily should be a total new dose of 17.5.

## 2014-08-25 DIAGNOSIS — M1712 Unilateral primary osteoarthritis, left knee: Secondary | ICD-10-CM | POA: Diagnosis not present

## 2014-09-13 DIAGNOSIS — M1712 Unilateral primary osteoarthritis, left knee: Secondary | ICD-10-CM | POA: Diagnosis not present

## 2014-09-14 ENCOUNTER — Ambulatory Visit (INDEPENDENT_AMBULATORY_CARE_PROVIDER_SITE_OTHER): Payer: Medicare Other

## 2014-09-14 ENCOUNTER — Telehealth: Payer: Self-pay | Admitting: Family

## 2014-09-14 DIAGNOSIS — I2699 Other pulmonary embolism without acute cor pulmonale: Secondary | ICD-10-CM | POA: Diagnosis not present

## 2014-09-14 DIAGNOSIS — Z5181 Encounter for therapeutic drug level monitoring: Secondary | ICD-10-CM

## 2014-09-14 DIAGNOSIS — Z7901 Long term (current) use of anticoagulants: Secondary | ICD-10-CM

## 2014-09-14 LAB — POCT INR: INR: 1.8

## 2014-09-14 NOTE — Telephone Encounter (Signed)
Agree with plan 

## 2014-09-17 ENCOUNTER — Telehealth: Payer: Self-pay | Admitting: Internal Medicine

## 2014-09-17 DIAGNOSIS — Z7901 Long term (current) use of anticoagulants: Secondary | ICD-10-CM

## 2014-09-17 NOTE — Telephone Encounter (Signed)
Order done, but we will need to plan f/u with coumadin clinic next time if possible

## 2014-09-17 NOTE — Telephone Encounter (Signed)
Would like lab work enter for [employer] b/c can not make appt 15th.

## 2014-09-20 DIAGNOSIS — M1712 Unilateral primary osteoarthritis, left knee: Secondary | ICD-10-CM | POA: Diagnosis not present

## 2014-09-21 NOTE — Telephone Encounter (Signed)
Patient informed of PCP instructions. 

## 2014-09-22 ENCOUNTER — Other Ambulatory Visit (INDEPENDENT_AMBULATORY_CARE_PROVIDER_SITE_OTHER): Payer: Medicare Other

## 2014-09-22 ENCOUNTER — Telehealth: Payer: Self-pay | Admitting: Internal Medicine

## 2014-09-22 DIAGNOSIS — Z7901 Long term (current) use of anticoagulants: Secondary | ICD-10-CM | POA: Diagnosis not present

## 2014-09-22 LAB — PROTIME-INR
INR: 2.6 ratio — ABNORMAL HIGH (ref 0.8–1.0)
Prothrombin Time: 28.4 s — ABNORMAL HIGH (ref 9.6–13.1)

## 2014-09-22 MED ORDER — ALBUTEROL SULFATE HFA 108 (90 BASE) MCG/ACT IN AERS: 2.0000 | INHALATION_SPRAY | Freq: Four times a day (QID) | RESPIRATORY_TRACT | Status: DC | PRN

## 2014-09-22 NOTE — Telephone Encounter (Signed)
rx done erx 

## 2014-09-22 NOTE — Telephone Encounter (Signed)
Pt came by requesting a refill on her Proair HFA 90 MCG to be sent to [facility] on [address].  Pt states she is leaving tomorrow for the holidays and want to take her refill with her.

## 2014-10-04 DIAGNOSIS — M1712 Unilateral primary osteoarthritis, left knee: Secondary | ICD-10-CM | POA: Diagnosis not present

## 2014-10-26 ENCOUNTER — Ambulatory Visit (INDEPENDENT_AMBULATORY_CARE_PROVIDER_SITE_OTHER): Payer: Medicare Other

## 2014-10-26 ENCOUNTER — Ambulatory Visit: Payer: Medicare Other

## 2014-10-26 DIAGNOSIS — Z5181 Encounter for therapeutic drug level monitoring: Secondary | ICD-10-CM

## 2014-10-26 DIAGNOSIS — Z7901 Long term (current) use of anticoagulants: Secondary | ICD-10-CM | POA: Diagnosis not present

## 2014-10-26 DIAGNOSIS — I2699 Other pulmonary embolism without acute cor pulmonale: Secondary | ICD-10-CM

## 2014-10-26 LAB — POCT INR: INR: 2.6

## 2014-10-27 ENCOUNTER — Ambulatory Visit (INDEPENDENT_AMBULATORY_CARE_PROVIDER_SITE_OTHER): Payer: Medicare Other | Admitting: Internal Medicine

## 2014-10-27 ENCOUNTER — Encounter: Payer: Self-pay | Admitting: Internal Medicine

## 2014-10-27 VITALS — BP 162/72 | HR 85 | Temp 97.9°F | Ht 65.0 in | Wt 159.0 lb

## 2014-10-27 DIAGNOSIS — J441 Chronic obstructive pulmonary disease with (acute) exacerbation: Secondary | ICD-10-CM | POA: Diagnosis not present

## 2014-10-27 DIAGNOSIS — I10 Essential (primary) hypertension: Secondary | ICD-10-CM | POA: Diagnosis not present

## 2014-10-27 DIAGNOSIS — J9611 Chronic respiratory failure with hypoxia: Secondary | ICD-10-CM

## 2014-10-27 MED ORDER — LEVOFLOXACIN 250 MG PO TABS: 250.0000 mg | ORAL_TABLET | Freq: Every day | ORAL | Status: DC

## 2014-10-27 MED ORDER — PREDNISONE 10 MG PO TABS: ORAL_TABLET | ORAL | Status: DC

## 2014-10-27 MED ORDER — METHYLPREDNISOLONE ACETATE 80 MG/ML IJ SUSP
80.0000 mg | Freq: Once | INTRAMUSCULAR | Status: AC
  Administered 2014-10-27: 80 mg via INTRAMUSCULAR

## 2014-10-27 NOTE — Assessment & Plan Note (Signed)
Without acute,  SpO2 Readings from Last 3 Encounters:  10/27/14 90%  08/03/14 91%  07/01/14 93%    to f/u any worsening symptoms or concerns

## 2014-10-27 NOTE — Progress Notes (Signed)
Subjective:    Patient ID: [name], female    DOB: 01/07/1930, 81 y.o.   MRN: [id number]  HPI  Here with acute onset mild to mod 2-3 days ST, HA, general weakness and malaise, with prod cough greenish sputum, but Pt denies chest pain, increased sob or doe, wheezing, orthopnea, PND, increased LE swelling, palpitations, dizziness or syncope., except for onset wheezing, mild SOB since yesterday.  Has Home o2, today sat 90% on RA without hyperpnea. Past Medical History  Diagnosis Date  . HYPERLIPIDEMIA   . ANXIETY   . DEPRESSION   . COMMON MIGRAINE   . Unspecified hearing loss   . HYPERTENSION   . CORONARY ARTERY DISEASE   . PULMONARY HYPERTENSION, SECONDARY   . CAROTID ARTERY STENOSIS, RIGHT   . Chronic rhinitis   . ALLERGIC RHINITIS   . COPD   . GERD   . DIVERTICULOSIS, COLON   . CYST/PSEUDOCYST, PANCREAS   . OVERACTIVE BLADDER   . OSTEOPOROSIS   . DIZZINESS, CHRONIC   . Pulmonary embolism 2008  . Bronchiectasis 12/11/2011    CT chest dx  . Esophageal stricture   . Complication of anesthesia     dizzy  . Stroke   . Tubular adenoma of colon    Past Surgical History  Procedure Laterality Date  . Abdominal hysterectomy  1965  . Egd  1999  . Cholecystectomy  1963  . Tonsillectomy    . Appendectomy  1963  . S/p sinus surgury    . Pancreatic cyst drainage      x 2  . Colonoscopy N/A 10/19/2013    Procedure: COLONOSCOPY;  Surgeon: Ladene Artist, MD;  Location: [facility];  Service: Endoscopy;  Laterality: N/A;    reports that she has never smoked. She has never used smokeless tobacco. She reports that she does not drink alcohol or use illicit drugs. family history includes Breast cancer in her daughter; Emphysema in her father; Heart disease in her mother; Lymphoma in her sister; Stroke in her father. Allergies  Allergen Reactions  . Amoxicillin-Pot Clavulanate     REACTION: diarrhea  . Ciclesonide     REACTION: bad smell  . Ciprofloxacin Other (See Comments)     dizziness  . Raloxifene     REACTION: risk of recurrent stroke  . Sulfonamide Derivatives     REACTION: tongue swells   Current Outpatient Prescriptions on File Prior to Visit  Medication Sig Dispense Refill  . albuterol (PROVENTIL HFA;VENTOLIN HFA) 108 (90 BASE) MCG/ACT inhaler Inhale 2 puffs into the lungs every 6 (six) hours as needed for wheezing or shortness of breath. 1 Inhaler 11  . amLODipine (NORVASC) 5 MG tablet TAKE 1 TABLET EVERY DAY 90 tablet 3  . aspirin 81 MG EC tablet Take 81 mg by mouth daily.     . calcium carbonate (TUMS) 500 MG chewable tablet Chew 1 tablet by mouth daily as needed.      . chlorpheniramine-HYDROcodone (TUSSIONEX PENNKINETIC ER) 10-8 MG/5ML LQCR Take 5 mLs by mouth every 12 (twelve) hours as needed for cough. 115 mL 0  . Digestive Enzymes (PAPAYA AND ENZYMES) CHEW Chew 2 tablets by mouth 3 (three) times daily.     [name] loratadine (CLARITIN) 10 MG tablet Take 10 mg by mouth daily.      [name] losartan (COZAAR) 100 MG tablet Take 1 tablet (100 mg total) by mouth daily. 90 tablet 3  . meclizine (ANTIVERT) 12.5 MG tablet Take 1 tablet (12.5 mg  total) by mouth 3 (three) times daily as needed for dizziness. 40 tablet 3  . mometasone (NASONEX) 50 MCG/ACT nasal spray Place 2 sprays into the nose daily as needed.     . Multiple Vitamins-Minerals (MACUVITE) TABS Take 1 tablet by mouth daily.      . pantoprazole (PROTONIX) 40 MG tablet TAKE 1 TABLET EVERY DAY 90 tablet 3  . Probiotic Product (PROBIOTIC DAILY PO) Take 1 tablet by mouth daily.    [name] warfarin (COUMADIN) 5 MG tablet Take 2.5-5 mg by mouth daily. TAKES 1/2 TABLET EVERY DAY EXCEPT Monday AND Friday SHE TAKES 5 MG     No current facility-administered medications on file prior to visit.   Review of Systems  Constitutional: Negative for unusual diaphoresis or other sweats  HENT: Negative for ringing in ear Eyes: Negative for double vision or worsening visual disturbance.  Respiratory: Negative for choking and  stridor.   Gastrointestinal: Negative for vomiting or other signifcant bowel change Genitourinary: Negative for hematuria or decreased urine volume.  Musculoskeletal: Negative for other MSK pain or swelling Skin: Negative for color change and worsening wound.  Neurological: Negative for tremors and numbness other than noted  Psychiatric/Behavioral: Negative for decreased concentration or agitation other than above       Objective:   Physical Exam BP 162/72 mmHg  Pulse 85  Temp(Src) 97.9 F (36.6 C) (Oral)  Ht 5\' 5"  (1.651 m)  Wt 159 lb (72.122 kg)  BMI 26.46 kg/m2  SpO2 90% VS noted, mild ill  Constitutional: Pt appears well-developed, well-nourished.  HENT: Head: NCAT.  Right Ear: External ear normal.  Left Ear: External ear normal.  Eyes: . Pupils are equal, round, and reactive to light. Conjunctivae and EOM are normal Neck: Normal range of motion. Neck supple.  Cardiovascular: Normal rate and regular rhythm.   Pulmonary/Chest: Effort normal and breath sounds without rales, with mild bilat insp wheezing. , and few right exp wheeze as well Neurological: Pt is alert. Not confused , motor grossly intact Skin: Skin is warm. No rash Psychiatric: Pt behavior is normal. No agitation.  SpO2 Readings from Last 3 Encounters:  10/27/14 90%  08/03/14 91%  07/01/14 93%       Assessment & Plan:

## 2014-10-27 NOTE — Progress Notes (Signed)
Pre visit review using our clinic review tool, if applicable. No additional management support is needed unless otherwise documented below in the visit note. 

## 2014-10-27 NOTE — Assessment & Plan Note (Signed)
elev today likely reactive, o/w stable overall by history and exam, recent data reviewed with pt, and pt to continue medical treatment as before,  to f/u any worsening symptoms or concerns BP Readings from Last 3 Encounters:  10/27/14 162/72  08/03/14 142/72  07/01/14 150/98

## 2014-10-27 NOTE — Patient Instructions (Signed)
You had the steroid shot today  Please take all new medication as prescribed - the antibiotic, and prednisone  OK to use the cough medicine you have at home  Please continue all other medications as before, and refills have been done if requested.  Please have the pharmacy call with any other refills you may need.  Please keep your appointments with your specialists as you may have planned

## 2014-10-27 NOTE — Assessment & Plan Note (Addendum)
Mild to mod, for antibx course, depomedrol IM, predpac asd,  Has cough med at home, cont home inhaler, to f/u any worsening symptoms or concerns

## 2014-10-28 ENCOUNTER — Telehealth: Payer: Self-pay | Admitting: Internal Medicine

## 2014-10-28 NOTE — Telephone Encounter (Signed)
Patient called with questions regarding the prescriptions that were sent in for her yesterday. The pharmacist has told her about some side effects and the patient is concerned. CB# [phone]

## 2014-10-28 NOTE — Telephone Encounter (Signed)
Patient stated pharmacist said antibiotic and prednisone do not work well together, may cause heart arrhythmia and problems with tendons? Advise if ok to continue or to offer alternative.

## 2014-10-28 NOTE — Telephone Encounter (Signed)
Ok to take this time, the issues is extremely rare but by law they are supposed to say that

## 2014-10-28 NOTE — Telephone Encounter (Signed)
Patient informed. 

## 2014-11-01 ENCOUNTER — Other Ambulatory Visit: Payer: Self-pay | Admitting: Internal Medicine

## 2014-11-01 DIAGNOSIS — Z86711 Personal history of pulmonary embolism: Secondary | ICD-10-CM

## 2014-11-11 ENCOUNTER — Telehealth: Payer: Self-pay | Admitting: Gastroenterology

## 2014-11-11 NOTE — Telephone Encounter (Signed)
I attempted to return the call to the patient. Her voicemail has not been set up and I was unable to leave a message

## 2014-11-12 NOTE — Telephone Encounter (Signed)
I attempted to return the call again no voicemail or answer

## 2014-11-15 NOTE — Telephone Encounter (Signed)
No answer or return calls from the patient.  I will await her return call

## 2014-11-19 ENCOUNTER — Other Ambulatory Visit (INDEPENDENT_AMBULATORY_CARE_PROVIDER_SITE_OTHER): Payer: Medicare Other

## 2014-11-19 ENCOUNTER — Ambulatory Visit (INDEPENDENT_AMBULATORY_CARE_PROVIDER_SITE_OTHER)
Admission: RE | Admit: 2014-11-19 | Discharge: 2014-11-19 | Disposition: A | Discharge status: Home / Self Care | Payer: Medicare Other | Source: Ambulatory Visit | Attending: Internal Medicine | Admitting: Internal Medicine

## 2014-11-19 ENCOUNTER — Ambulatory Visit (INDEPENDENT_AMBULATORY_CARE_PROVIDER_SITE_OTHER): Payer: Medicare Other | Admitting: Internal Medicine

## 2014-11-19 ENCOUNTER — Encounter: Payer: Self-pay | Admitting: Internal Medicine

## 2014-11-19 VITALS — BP 170/70 | HR 80 | Temp 98.2°F | Ht 65.0 in | Wt 157.0 lb

## 2014-11-19 DIAGNOSIS — R05 Cough: Secondary | ICD-10-CM

## 2014-11-19 DIAGNOSIS — I10 Essential (primary) hypertension: Secondary | ICD-10-CM

## 2014-11-19 DIAGNOSIS — R350 Frequency of micturition: Secondary | ICD-10-CM

## 2014-11-19 DIAGNOSIS — J471 Bronchiectasis with (acute) exacerbation: Secondary | ICD-10-CM | POA: Diagnosis not present

## 2014-11-19 DIAGNOSIS — J439 Emphysema, unspecified: Secondary | ICD-10-CM | POA: Diagnosis not present

## 2014-11-19 DIAGNOSIS — Z7901 Long term (current) use of anticoagulants: Secondary | ICD-10-CM

## 2014-11-19 DIAGNOSIS — R059 Cough, unspecified: Secondary | ICD-10-CM

## 2014-11-19 DIAGNOSIS — I272 Other secondary pulmonary hypertension: Secondary | ICD-10-CM | POA: Diagnosis not present

## 2014-11-19 LAB — URINALYSIS
Bilirubin Urine: NEGATIVE
Hgb urine dipstick: NEGATIVE
Ketones, ur: NEGATIVE
Leukocytes, UA: NEGATIVE
NITRITE: NEGATIVE
Specific Gravity, Urine: <=1.005 — AB (ref 1.000–1.030)
Total Protein, Urine: NEGATIVE
UROBILINOGEN UA: 0.2 (ref 0.0–1.0)
Urine Glucose: NEGATIVE
pH: 5.5 (ref 5.0–8.0)

## 2014-11-19 MED ORDER — CEFDINIR 300 MG PO CAPS
300.0000 mg | ORAL_CAPSULE | Freq: Two times a day (BID) | ORAL | Status: DC
Start: 2014-11-19 — End: 2015-01-10

## 2014-11-19 NOTE — Progress Notes (Signed)
Subjective:    Patient ID: [name], female    DOB: [birth year], 81 y.o.   MRN: [id number]  HPI  Here to f/u, c/o just not feeling better, bilat ears and mild increased wheezing with mild increased prod cough, concerned about pna.  Denies HA, ST, fever, CP, worsening sob/doe/palps or dizziness.  Pt deniesorthopnea, PND, increased LE swelling, palpitations, dizziness or syncope.   Pt denies new neurological symptoms such as new headache, or facial or extremity weakness or numbness. Also has mild urinary freq but Denies urinary symptoms such as dysuria, urgency, flank pain, hematuria or n/v, fever, chills. Past Medical History  Diagnosis Date  . HYPERLIPIDEMIA   . ANXIETY   . DEPRESSION   . COMMON MIGRAINE   . Unspecified hearing loss   . HYPERTENSION   . CORONARY ARTERY DISEASE   . PULMONARY HYPERTENSION, SECONDARY   . CAROTID ARTERY STENOSIS, RIGHT   . Chronic rhinitis   . ALLERGIC RHINITIS   . COPD   . GERD   . DIVERTICULOSIS, COLON   . CYST/PSEUDOCYST, PANCREAS   . OVERACTIVE BLADDER   . OSTEOPOROSIS   . DIZZINESS, CHRONIC   . Pulmonary embolism 2008  . Bronchiectasis 12/11/2011    CT chest dx  . Esophageal stricture   . Complication of anesthesia     dizzy  . Stroke   . Tubular adenoma of colon    Past Surgical History  Procedure Laterality Date  . Abdominal hysterectomy  1965  . Egd  1999  . Cholecystectomy  1963  . Tonsillectomy    . Appendectomy  1963  . S/p sinus surgury    . Pancreatic cyst drainage      x 2  . Colonoscopy N/A 10/19/2013    Procedure: COLONOSCOPY;  Surgeon: Ladene Artist, MD;  Location: [facility];  Service: Endoscopy;  Laterality: N/A;    reports that she has never smoked. She has never used smokeless tobacco. She reports that she does not drink alcohol or use illicit drugs. family history includes Breast cancer in her daughter; Emphysema in her father; Heart disease in her mother; Lymphoma in her sister; Stroke in her  father. Allergies  Allergen Reactions  . Amoxicillin-Pot Clavulanate     REACTION: diarrhea  . Ciclesonide     REACTION: bad smell  . Ciprofloxacin Other (See Comments)    dizziness  . Raloxifene     REACTION: risk of recurrent stroke  . Sulfonamide Derivatives     REACTION: tongue swells   Current Outpatient Prescriptions on File Prior to Visit  Medication Sig Dispense Refill  . albuterol (PROVENTIL HFA;VENTOLIN HFA) 108 (90 BASE) MCG/ACT inhaler Inhale 2 puffs into the lungs every 6 (six) hours as needed for wheezing or shortness of breath. 1 Inhaler 11  . amLODipine (NORVASC) 5 MG tablet TAKE 1 TABLET EVERY DAY 90 tablet 3  . aspirin 81 MG EC tablet Take 81 mg by mouth daily.     . calcium carbonate (TUMS) 500 MG chewable tablet Chew 1 tablet by mouth daily as needed.      . chlorpheniramine-HYDROcodone (TUSSIONEX PENNKINETIC ER) 10-8 MG/5ML LQCR Take 5 mLs by mouth every 12 (twelve) hours as needed for cough. 115 mL 0  . Digestive Enzymes (PAPAYA AND ENZYMES) CHEW Chew 2 tablets by mouth 3 (three) times daily.     [name] loratadine (CLARITIN) 10 MG tablet Take 10 mg by mouth daily.      [name] losartan (COZAAR) 100 MG tablet Take  1 tablet (100 mg total) by mouth daily. 90 tablet 3  . meclizine (ANTIVERT) 12.5 MG tablet Take 1 tablet (12.5 mg total) by mouth 3 (three) times daily as needed for dizziness. 40 tablet 3  . mometasone (NASONEX) 50 MCG/ACT nasal spray Place 2 sprays into the nose daily as needed.     . Multiple Vitamins-Minerals (MACUVITE) TABS Take 1 tablet by mouth daily.      . pantoprazole (PROTONIX) 40 MG tablet TAKE 1 TABLET EVERY DAY 90 tablet 3  . Probiotic Product (PROBIOTIC DAILY PO) Take 1 tablet by mouth daily.    [name] warfarin (COUMADIN) 5 MG tablet TAKE AS DIRECTED BY ANTICOAGULATION CLINIC. 90 tablet 0   No current facility-administered medications on file prior to visit.   Review of Systems  Constitutional: Negative for unusual diaphoresis or other sweats  HENT:  Negative for ringing in ear Eyes: Negative for double vision or worsening visual disturbance.  Respiratory: Negative for choking and stridor.   Gastrointestinal: Negative for vomiting or other signifcant bowel change Genitourinary: Negative for hematuria or decreased urine volume.  Musculoskeletal: Negative for other MSK pain or swelling Skin: Negative for color change and worsening wound.  Neurological: Negative for tremors and numbness other than noted  Psychiatric/Behavioral: Negative for decreased concentration or agitation other than above       Objective:   Physical Exam BP 170/70 mmHg  Pulse 80  Temp(Src) 98.2 F (36.8 C) (Oral)  Ht 5\' 5"  (1.651 m)  Wt 157 lb (71.215 kg)  BMI 26.13 kg/m2  SpO2 93% VS noted, mild ill Constitutional: Pt appears well-developed, well-nourished.  HENT: Head: NCAT.  Right Ear: External ear normal.  Left Ear: External ear normal.  Eyes: . Pupils are equal, round, and reactive to light. Conjunctivae and EOM are normal Bilat tm's with mild erythema.  Max sinus areas non tender.  Pharynx with mild erythema, no exudate Neck: Normal range of motion. Neck supple.  Cardiovascular: Normal rate and regular rhythm.   Pulmonary/Chest: Effort normal and breath sounds decreased without rales but few RLL rales, and few bilat wheezing.  Ab: soft, NT, no flank tender Neurological: Pt is alert. Not confused , motor grossly intact Skin: Skin is warm. No rash Psychiatric: Pt behavior is normal. No agitation.     Assessment & Plan:

## 2014-11-19 NOTE — Progress Notes (Signed)
Pre visit review using our clinic review tool, if applicable. No additional management support is needed unless otherwise documented below in the visit note. 

## 2014-11-19 NOTE — Patient Instructions (Signed)
Please take all new medication as prescribed  - the antibiotic  Please continue all other medications as before, and refills have been done if requested.  Please have the pharmacy call with any other refills you may need.  Please keep your appointments with your specialists as you may have planned  Please go to the XRAY Department in the Basement (go straight as you get off the elevator) for the x-ray testing  You will be contacted by phone if any changes need to be made immediately.  Otherwise, you will receive a letter about your results with an explanation, but please check with MyChart first.  Please remember to sign up for MyChart if you have not done so, as this will be important to you in the future with finding out test results, communicating by private email, and scheduling acute appointments online when needed.    

## 2014-11-20 DIAGNOSIS — R350 Frequency of micturition: Secondary | ICD-10-CM | POA: Insufficient documentation

## 2014-11-20 NOTE — Assessment & Plan Note (Signed)
Mild to mod, for antibx course,  to f/u any worsening symptoms or concerns 

## 2014-11-20 NOTE — Assessment & Plan Note (Signed)
For UA, r/o UTI,  to f/u any worsening symptoms or concerns

## 2014-11-20 NOTE — Assessment & Plan Note (Signed)
Likely reactive, o/w stable overall by history and exam, recent data reviewed with pt, and pt to continue medical treatment as before,  to f/u any worsening symptoms or concerns SpO2 Readings from Last 3 Encounters:  11/19/14 93%  10/27/14 90%  08/03/14 91%   BP Readings from Last 3 Encounters:  11/19/14 170/70  10/27/14 162/72  08/03/14 142/72

## 2014-11-30 ENCOUNTER — Ambulatory Visit (INDEPENDENT_AMBULATORY_CARE_PROVIDER_SITE_OTHER): Payer: Medicare Other | Admitting: General Practice

## 2014-11-30 DIAGNOSIS — I2699 Other pulmonary embolism without acute cor pulmonale: Secondary | ICD-10-CM | POA: Diagnosis not present

## 2014-11-30 DIAGNOSIS — Z5181 Encounter for therapeutic drug level monitoring: Secondary | ICD-10-CM

## 2014-11-30 DIAGNOSIS — Z7901 Long term (current) use of anticoagulants: Secondary | ICD-10-CM

## 2014-11-30 LAB — POCT INR: INR: 1.6

## 2014-11-30 NOTE — Progress Notes (Signed)
Agree with plan 

## 2014-11-30 NOTE — Progress Notes (Signed)
Pre visit review using our clinic review tool, if applicable. No additional management support is needed unless otherwise documented below in the visit note. 

## 2014-12-08 ENCOUNTER — Ambulatory Visit: Payer: Medicare Other | Admitting: Gastroenterology

## 2014-12-14 ENCOUNTER — Ambulatory Visit (INDEPENDENT_AMBULATORY_CARE_PROVIDER_SITE_OTHER): Payer: Medicare Other | Admitting: General Practice

## 2014-12-14 DIAGNOSIS — I2699 Other pulmonary embolism without acute cor pulmonale: Secondary | ICD-10-CM | POA: Diagnosis not present

## 2014-12-14 DIAGNOSIS — Z5181 Encounter for therapeutic drug level monitoring: Secondary | ICD-10-CM | POA: Diagnosis not present

## 2014-12-14 DIAGNOSIS — Z7901 Long term (current) use of anticoagulants: Secondary | ICD-10-CM | POA: Diagnosis not present

## 2014-12-14 LAB — POCT INR: INR: 2.2

## 2014-12-14 NOTE — Progress Notes (Signed)
Pre visit review using our clinic review tool, if applicable. No additional management support is needed unless otherwise documented below in the visit note. 

## 2014-12-14 NOTE — Progress Notes (Signed)
Agree with plan 

## 2014-12-21 DIAGNOSIS — H93292 Other abnormal auditory perceptions, left ear: Secondary | ICD-10-CM | POA: Diagnosis not present

## 2014-12-21 DIAGNOSIS — J301 Allergic rhinitis due to pollen: Secondary | ICD-10-CM | POA: Diagnosis not present

## 2014-12-21 DIAGNOSIS — R42 Dizziness and giddiness: Secondary | ICD-10-CM | POA: Diagnosis not present

## 2014-12-21 DIAGNOSIS — H6122 Impacted cerumen, left ear: Secondary | ICD-10-CM | POA: Diagnosis not present

## 2015-01-10 ENCOUNTER — Ambulatory Visit (INDEPENDENT_AMBULATORY_CARE_PROVIDER_SITE_OTHER): Payer: Medicare Other | Admitting: Gastroenterology

## 2015-01-10 ENCOUNTER — Other Ambulatory Visit (INDEPENDENT_AMBULATORY_CARE_PROVIDER_SITE_OTHER): Payer: Medicare Other

## 2015-01-10 ENCOUNTER — Encounter: Payer: Self-pay | Admitting: Gastroenterology

## 2015-01-10 VITALS — BP 140/60 | HR 88 | Ht 63.75 in | Wt 158.4 lb

## 2015-01-10 DIAGNOSIS — K219 Gastro-esophageal reflux disease without esophagitis: Secondary | ICD-10-CM

## 2015-01-10 DIAGNOSIS — R1013 Epigastric pain: Secondary | ICD-10-CM

## 2015-01-10 DIAGNOSIS — K648 Other hemorrhoids: Secondary | ICD-10-CM

## 2015-01-10 LAB — BASIC METABOLIC PANEL
BUN: 14 mg/dL (ref 6–23)
CALCIUM: 9.6 mg/dL (ref 8.4–10.5)
CO2: 29 meq/L (ref 19–32)
Chloride: 100 mEq/L (ref 96–112)
Creatinine, Ser: 0.86 mg/dL (ref 0.40–1.20)
GFR: 67.02 mL/min (ref >60.00)
GLUCOSE: 108 mg/dL — AB (ref 70–99)
POTASSIUM: 4.2 meq/L (ref 3.5–5.1)
Sodium: 135 mEq/L (ref 135–145)

## 2015-01-10 LAB — HEPATIC FUNCTION PANEL
ALT: 18 U/L (ref 0–35)
AST: 20 U/L (ref 0–37)
Albumin: 3.7 g/dL (ref 3.5–5.2)
Alkaline Phosphatase: 87 U/L (ref 39–117)
BILIRUBIN DIRECT: 0.1 mg/dL (ref 0.0–0.3)
TOTAL PROTEIN: 7.4 g/dL (ref 6.0–8.3)
Total Bilirubin: 0.5 mg/dL (ref 0.2–1.2)

## 2015-01-10 LAB — LIPASE: Lipase: 23 U/L (ref 11.0–59.0)

## 2015-01-10 LAB — CBC WITH DIFFERENTIAL/PLATELET
BASOS PCT: 0.3 % (ref 0.0–3.0)
Basophils Absolute: 0 10*3/uL (ref 0.0–0.1)
Eosinophils Absolute: 0.1 10*3/uL (ref 0.0–0.7)
Eosinophils Relative: 0.5 % (ref 0.0–5.0)
HCT: 41.7 % (ref 36.0–46.0)
HEMOGLOBIN: 14 g/dL (ref 12.0–15.0)
LYMPHS ABS: 2.6 10*3/uL (ref 0.7–4.0)
LYMPHS PCT: 24.6 % (ref 12.0–46.0)
MCHC: 33.5 g/dL (ref 30.0–36.0)
MCV: 93.3 fl (ref 78.0–100.0)
Monocytes Absolute: 0.9 10*3/uL (ref 0.1–1.0)
Monocytes Relative: 8.2 % (ref 3.0–12.0)
NEUTROS ABS: 7.1 10*3/uL (ref 1.4–7.7)
Neutrophils Relative %: 66.4 % (ref 43.0–77.0)
Platelets: 255 10*3/uL (ref 150.0–400.0)
RBC: 4.47 Mil/uL (ref 3.87–5.11)
RDW: 13.6 % (ref 11.5–15.5)
WBC: 10.7 10*3/uL — ABNORMAL HIGH (ref 4.0–10.5)

## 2015-01-10 LAB — TSH: TSH: 2.86 u[IU]/mL (ref 0.35–4.50)

## 2015-01-10 MED ORDER — PANTOPRAZOLE SODIUM 40 MG PO TBEC: 40.0000 mg | DELAYED_RELEASE_TABLET | Freq: Two times a day (BID) | ORAL | Status: DC

## 2015-01-10 NOTE — Progress Notes (Signed)
History of Present Illness: This is an 81 year old female with altered comorbidities including COPD, bronchiectasis, coronary artery disease. She complains of upper abdominal pain, reflux symptoms, nausea or the past several weeks and small amounts of bright red blood per rectum last week. She underwent colonoscopy in January 2015 showing an adenomatous colon polyp, moderate diverticulosis and moderate-sized internal hemorrhoids. She is maintained on her friend with a history of a pulmonary embolism. Currently taking pantoprazole 40 mg daily for management of GERD.  Allergies  Allergen Reactions  . Amoxicillin-Pot Clavulanate     REACTION: diarrhea  . Ciclesonide     REACTION: bad smell  . Ciprofloxacin Other (See Comments)    dizziness  . Raloxifene     REACTION: risk of recurrent stroke  . Sulfonamide Derivatives     REACTION: tongue swells   Outpatient Prescriptions Prior to Visit  Medication Sig Dispense Refill  . albuterol (PROVENTIL HFA;VENTOLIN HFA) 108 (90 BASE) MCG/ACT inhaler Inhale 2 puffs into the lungs every 6 (six) hours as needed for wheezing or shortness of breath. 1 Inhaler 11  . amLODipine (NORVASC) 5 MG tablet TAKE 1 TABLET EVERY DAY 90 tablet 3  . aspirin 81 MG EC tablet Take 81 mg by mouth daily.     . calcium carbonate (TUMS) 500 MG chewable tablet Chew 1 tablet by mouth daily as needed.      . chlorpheniramine-HYDROcodone (TUSSIONEX PENNKINETIC ER) 10-8 MG/5ML LQCR Take 5 mLs by mouth every 12 (twelve) hours as needed for cough. 115 mL 0  . Digestive Enzymes (PAPAYA AND ENZYMES) CHEW Chew 2 tablets by mouth 3 (three) times daily.     [name] loratadine (CLARITIN) 10 MG tablet Take 10 mg by mouth daily.      [name] losartan (COZAAR) 100 MG tablet Take 1 tablet (100 mg total) by mouth daily. 90 tablet 3  . meclizine (ANTIVERT) 12.5 MG tablet Take 1 tablet (12.5 mg total) by mouth 3 (three) times daily as needed for dizziness. 40 tablet 3  . mometasone (NASONEX) 50 MCG/ACT  nasal spray Place 2 sprays into the nose daily as needed.     . Multiple Vitamins-Minerals (MACUVITE) TABS Take 1 tablet by mouth daily.      . pantoprazole (PROTONIX) 40 MG tablet TAKE 1 TABLET EVERY DAY 90 tablet 3  . Probiotic Product (PROBIOTIC DAILY PO) Take 1 tablet by mouth daily.    [name] warfarin (COUMADIN) 5 MG tablet TAKE AS DIRECTED BY ANTICOAGULATION CLINIC. 90 tablet 0  . cefdinir (OMNICEF) 300 MG capsule Take 1 capsule (300 mg total) by mouth 2 (two) times daily. 20 capsule 0   No facility-administered medications prior to visit.   Past Medical History  Diagnosis Date  . HYPERLIPIDEMIA   . ANXIETY   . DEPRESSION   . COMMON MIGRAINE   . Unspecified hearing loss   . HYPERTENSION   . CORONARY ARTERY DISEASE   . PULMONARY HYPERTENSION, SECONDARY   . CAROTID ARTERY STENOSIS, RIGHT   . Chronic rhinitis   . ALLERGIC RHINITIS   . COPD   . GERD   . DIVERTICULOSIS, COLON   . CYST/PSEUDOCYST, PANCREAS   . OVERACTIVE BLADDER   . OSTEOPOROSIS   . DIZZINESS, CHRONIC   . Pulmonary embolism 2008  . Bronchiectasis 12/11/2011    CT chest dx  . Esophageal stricture   . Complication of anesthesia     dizzy  . Stroke   . Tubular adenoma of colon 10/2013   Past Surgical  History  Procedure Laterality Date  . Abdominal hysterectomy  1965  . Egd  1999  . Cholecystectomy  1963  . Tonsillectomy    . Appendectomy  1963  . S/p sinus surgury    . Pancreatic cyst drainage      x 2  . Colonoscopy N/A 10/19/2013    Procedure: COLONOSCOPY;  Surgeon: Ladene Artist, MD;  Location: [facility];  Service: Endoscopy;  Laterality: N/A;   History   Social History  . Marital Status: Widowed    Spouse Name: N/A  . Number of Children: 2  . Years of Education: N/A   Occupational History  . retired    Social History Main Topics  . Smoking status: Never Smoker   . Smokeless tobacco: Never Used     Comment: spouse smoked in the home for 15 years  . Alcohol Use: No  . Drug Use: No  .  Sexual Activity: Not Currently   Other Topics Concern  . None   Social History Narrative   Daily caffeine use   Family History  Problem Relation Age of Onset  . Breast cancer Daughter   . Emphysema Father     was a smoker  . Lymphoma Sister   . Stroke Father   . Heart disease Mother      Physical Exam: General: Well developed , well nourished, no acute distress Head: Normocephalic and atraumatic Eyes:  sclerae anicteric, EOMI Ears: Normal auditory acuity Mouth: No deformity or lesions Lungs: Clear throughout to auscultation Heart: Regular rate and rhythm; no murmurs, rubs or bruits Abdomen: Soft, non tender and non distended. No masses, hepatosplenomegaly or hernias noted. Normal Bowel sounds Rectal: Musculoskeletal: Symmetrical with no gross deformities  Pulses:  Normal pulses noted Extremities: No clubbing, cyanosis, edema or deformities noted Neurological: Alert oriented x 4, grossly nonfocal Psychological:  Alert and cooperative. Normal mood and affect  Assessment and Recommendations:  1. Internal hemorrhoids leading to small volume hematochezia. Local care with standard rectal care instructions Anusol cream and Anusol HC suppositories as needed.  2. GERD. Incomplete symptom control. Increase pantoprazole to 40 mg twice a day and intensify all standard antireflux measures.  3. Personal history of adenomatous colon polyps. Due to her age and comorbidities no future surveillance for screening colonoscopies or plans.  4. History of pulmonary embolism maintained on warfarin.  5. Bronchiectasis, COPD, CAD.

## 2015-01-10 NOTE — Patient Instructions (Signed)
Your physician has requested that you go to the basement for lab work before leaving today.  Increase your Protonix to twice daily long-term. A new prescription has been sent to your pharmacy.   Thank you for choosing me and [facility].  Pricilla Riffle. Dagoberto Ligas., MD., [facility] Regal

## 2015-01-11 ENCOUNTER — Ambulatory Visit: Payer: Medicare Other

## 2015-01-18 ENCOUNTER — Ambulatory Visit (INDEPENDENT_AMBULATORY_CARE_PROVIDER_SITE_OTHER): Payer: Medicare Other | Admitting: General Practice

## 2015-01-18 DIAGNOSIS — I2699 Other pulmonary embolism without acute cor pulmonale: Secondary | ICD-10-CM

## 2015-01-18 DIAGNOSIS — Z5181 Encounter for therapeutic drug level monitoring: Secondary | ICD-10-CM

## 2015-01-18 LAB — POCT INR: INR: 2.1

## 2015-01-18 NOTE — Progress Notes (Signed)
Agree with plan 

## 2015-01-18 NOTE — Progress Notes (Signed)
Pre visit review using our clinic review tool, if applicable. No additional management support is needed unless otherwise documented below in the visit note. 

## 2015-02-09 DIAGNOSIS — H3531 Nonexudative age-related macular degeneration: Secondary | ICD-10-CM | POA: Diagnosis not present

## 2015-02-09 DIAGNOSIS — H04123 Dry eye syndrome of bilateral lacrimal glands: Secondary | ICD-10-CM | POA: Diagnosis not present

## 2015-02-09 DIAGNOSIS — Z961 Presence of intraocular lens: Secondary | ICD-10-CM | POA: Diagnosis not present

## 2015-02-09 DIAGNOSIS — H35033 Hypertensive retinopathy, bilateral: Secondary | ICD-10-CM | POA: Diagnosis not present

## 2015-02-15 ENCOUNTER — Ambulatory Visit (INDEPENDENT_AMBULATORY_CARE_PROVIDER_SITE_OTHER): Payer: Medicare Other | Admitting: General Practice

## 2015-02-15 DIAGNOSIS — Z5181 Encounter for therapeutic drug level monitoring: Secondary | ICD-10-CM

## 2015-02-15 DIAGNOSIS — I2699 Other pulmonary embolism without acute cor pulmonale: Secondary | ICD-10-CM | POA: Diagnosis not present

## 2015-02-15 LAB — POCT INR: INR: 1.5

## 2015-02-15 NOTE — Progress Notes (Signed)
Agree with plan 

## 2015-02-15 NOTE — Progress Notes (Signed)
Pre visit review using our clinic review tool, if applicable. No additional management support is needed unless otherwise documented below in the visit note. 

## 2015-03-15 ENCOUNTER — Ambulatory Visit: Payer: Medicare Other

## 2015-03-15 ENCOUNTER — Ambulatory Visit (INDEPENDENT_AMBULATORY_CARE_PROVIDER_SITE_OTHER): Payer: Medicare Other | Admitting: *Deleted

## 2015-03-15 DIAGNOSIS — Z7901 Long term (current) use of anticoagulants: Secondary | ICD-10-CM | POA: Diagnosis not present

## 2015-03-15 DIAGNOSIS — I2699 Other pulmonary embolism without acute cor pulmonale: Secondary | ICD-10-CM | POA: Diagnosis not present

## 2015-03-15 DIAGNOSIS — Z5181 Encounter for therapeutic drug level monitoring: Secondary | ICD-10-CM | POA: Diagnosis not present

## 2015-03-15 LAB — POCT INR: INR: 2.2

## 2015-03-15 NOTE — Progress Notes (Signed)
Pre visit review using our clinic review tool, if applicable. No additional management support is needed unless otherwise documented below in the visit note. 

## 2015-03-15 NOTE — Progress Notes (Signed)
I have reviewed and agree with the plan. 

## 2015-03-21 ENCOUNTER — Other Ambulatory Visit (INDEPENDENT_AMBULATORY_CARE_PROVIDER_SITE_OTHER): Payer: Medicare Other

## 2015-03-21 ENCOUNTER — Ambulatory Visit (INDEPENDENT_AMBULATORY_CARE_PROVIDER_SITE_OTHER): Payer: Medicare Other | Admitting: Internal Medicine

## 2015-03-21 ENCOUNTER — Encounter: Payer: Self-pay | Admitting: Internal Medicine

## 2015-03-21 VITALS — BP 162/70 | HR 82 | Temp 97.9°F | Resp 18 | Wt 159.0 lb

## 2015-03-21 DIAGNOSIS — R300 Dysuria: Secondary | ICD-10-CM

## 2015-03-21 LAB — URINALYSIS, ROUTINE W REFLEX MICROSCOPIC
Bilirubin Urine: NEGATIVE
KETONES UR: NEGATIVE
Nitrite: NEGATIVE
PH: 5.5 (ref 5.0–8.0)
Specific Gravity, Urine: 1.025 (ref 1.000–1.030)
Total Protein, Urine: NEGATIVE
Urine Glucose: NEGATIVE
Urobilinogen, UA: 0.2 (ref 0.0–1.0)

## 2015-03-21 MED ORDER — NITROFURANTOIN MONOHYD MACRO 100 MG PO CAPS: 100.0000 mg | ORAL_CAPSULE | Freq: Two times a day (BID) | ORAL | Status: DC

## 2015-03-21 NOTE — Patient Instructions (Signed)
Drink as much nondairy fluids as possible. Avoid spicy foods or alcohol as  these may aggravate the bladder. Do not take decongestants. Avoid narcotics if possible.   Plain Mucinex (NOT D) for thick secretions ;force NON dairy fluids .   Nasal cleansing in the shower as discussed with lather of mild shampoo.After 10 seconds wash off lather while  exhaling through nostrils. Make sure that all residual soap is removed to prevent irritation.  Flonase OR Nasacort AQ 1 spray in each nostril twice a day as needed. Use the "crossover" technique into opposite nostril spraying toward opposite ear @ 45 degree angle, not straight up into nostril.  Plain Allegra (NOT D )  160 daily , Loratidine 10 mg , OR Zyrtec 10 mg @ bedtime  as needed for itchy eyes & sneezing.   Your next office appointment will be determined based upon review of your pending urine culture.  Those written interpretation of the lab results and instructions will be transmitted to you by mail for your records.  Critical results will be called.   Followup as needed for any active or acute issue. Please report any significant change in your symptoms.

## 2015-03-21 NOTE — Progress Notes (Signed)
Pre visit review using our clinic review tool, if applicable. No additional management support is needed unless otherwise documented below in the visit note. 

## 2015-03-21 NOTE — Progress Notes (Signed)
   Subjective:    Patient ID: [name], female    DOB: 28-Dec-1929, 81 y.o.   MRN: [id number]  HPI  Her symptoms originally began 03/11/15 as malaise and nausea. She also had some frequency and oliguria. As of 6/8 she developed dysuria and chills.  She now describes ongoing urgency, frequency, dysuria, nausea, chills, and flank discomfort.  A chronic issue is morning sputum production of small amounts of phlegm. She has a history of COPD and bronchiectasis.   She describes sneezing and postnasal drainage.  She denies other GI or genitourinary symptoms. She has no upper respiratory tract infection symptoms.  Review of Systems Frontal headache, facial pain , nasal purulence, dental pain, sore throat , otic pain or otic discharge denied. No fever or sweats.  Unexplained weight loss, abdominal pain, significant dyspepsia, dysphagia, melena, rectal bleeding, or persistently small caliber stools are denied.     Objective:   Physical Exam Pertinent or positive findings include: She has isolated pops and wheezes over the posterior thorax mainly on the right. She does have inspiratory rhonchi over the right anterior chest. There is no increased work of breathing. She has crepitus of her knees.   General appearance :adequately nourished; in no distress.  Eyes: No conjunctival inflammation or scleral icterus is present.  Oral exam:  Lips and gums are healthy appearing.There is no oropharyngeal erythema or exudate noted. Dental hygiene is good.  Heart:  Normal rate and regular rhythm. S1 and S2 normal without gallop, murmur, click, rub or other extra sounds    Abdomen: bowel sounds normal, soft and non-tender without masses, organomegaly or hernias noted.  No guarding or rebound. No flank tenderness to percussion.  Vascular : all pulses equal ; no bruits present.  Skin:Warm & dry.  Intact without suspicious lesions or rashes ; no tenting or jaundice   Lymphatic: No lymphadenopathy  is noted about the head, neck, axilla  Neuro: Strength, tone normal.        Assessment & Plan:  #1 urgency, frequency, dysuria; probable urinary tract infection  #2 rhinitis with morning cough  Plan: See orders and recommendations.

## 2015-03-24 LAB — URINE CULTURE: Colony Count: >=100000

## 2015-04-13 ENCOUNTER — Ambulatory Visit: Payer: Medicare Other

## 2015-04-19 ENCOUNTER — Other Ambulatory Visit: Payer: Self-pay | Admitting: Internal Medicine

## 2015-04-20 ENCOUNTER — Ambulatory Visit: Payer: Medicare Other

## 2015-04-26 ENCOUNTER — Ambulatory Visit (INDEPENDENT_AMBULATORY_CARE_PROVIDER_SITE_OTHER): Payer: Medicare Other | Admitting: General Practice

## 2015-04-26 DIAGNOSIS — I2699 Other pulmonary embolism without acute cor pulmonale: Secondary | ICD-10-CM | POA: Diagnosis not present

## 2015-04-26 DIAGNOSIS — Z5181 Encounter for therapeutic drug level monitoring: Secondary | ICD-10-CM | POA: Diagnosis not present

## 2015-04-26 DIAGNOSIS — Z7901 Long term (current) use of anticoagulants: Secondary | ICD-10-CM

## 2015-04-26 LAB — POCT INR: INR: 1.4

## 2015-04-26 NOTE — Progress Notes (Signed)
I have reviewed and agree with the plan. 

## 2015-04-26 NOTE — Progress Notes (Signed)
Pre visit review using our clinic review tool, if applicable. No additional management support is needed unless otherwise documented below in the visit note. 

## 2015-05-24 ENCOUNTER — Ambulatory Visit: Payer: Medicare Other

## 2015-05-31 ENCOUNTER — Ambulatory Visit (INDEPENDENT_AMBULATORY_CARE_PROVIDER_SITE_OTHER): Payer: Medicare Other | Admitting: General Practice

## 2015-05-31 DIAGNOSIS — Z5181 Encounter for therapeutic drug level monitoring: Secondary | ICD-10-CM

## 2015-05-31 DIAGNOSIS — Z7901 Long term (current) use of anticoagulants: Secondary | ICD-10-CM | POA: Diagnosis not present

## 2015-05-31 DIAGNOSIS — I2699 Other pulmonary embolism without acute cor pulmonale: Secondary | ICD-10-CM | POA: Diagnosis not present

## 2015-05-31 LAB — POCT INR: INR: 2.2

## 2015-05-31 NOTE — Progress Notes (Signed)
I have reviewed and agree with the plan. 

## 2015-05-31 NOTE — Progress Notes (Signed)
Pre visit review using our clinic review tool, if applicable. No additional management support is needed unless otherwise documented below in the visit note. 

## 2015-06-28 ENCOUNTER — Ambulatory Visit: Payer: Medicare Other

## 2015-06-29 DIAGNOSIS — M1712 Unilateral primary osteoarthritis, left knee: Secondary | ICD-10-CM | POA: Diagnosis not present

## 2015-07-06 ENCOUNTER — Telehealth: Payer: Self-pay | Admitting: Internal Medicine

## 2015-07-06 ENCOUNTER — Ambulatory Visit: Payer: Medicare Other

## 2015-07-06 DIAGNOSIS — M1712 Unilateral primary osteoarthritis, left knee: Secondary | ICD-10-CM | POA: Diagnosis not present

## 2015-07-06 NOTE — Telephone Encounter (Signed)
Pt informed and will keep her appt on Tuesday.

## 2015-07-06 NOTE — Telephone Encounter (Signed)
Pt had coumadin appt this afternoon and I needed to reschedule to this morning due to [place] being out.  She can't come this morning and I rescheduled her on Tuesday. Just wanted to be sure this is ok with you.  Please advise

## 2015-07-06 NOTE — Telephone Encounter (Signed)
I doubt medicare would pay for lab draw without OV as her last OV was June 2016  Will cindy be back soon?  Only alternative is to ask pt to be seen at OV to have lab done if [facility] not available soon, or if she cannot be referred to a different coumadin clinic

## 2015-07-06 NOTE — Telephone Encounter (Signed)
Pt called back and she is wondering if there is somewhere she can come in tomorrow to get her blood drawn. Please advise

## 2015-07-12 ENCOUNTER — Ambulatory Visit: Payer: Medicare Other

## 2015-07-18 DIAGNOSIS — M1712 Unilateral primary osteoarthritis, left knee: Secondary | ICD-10-CM | POA: Diagnosis not present

## 2015-07-20 ENCOUNTER — Other Ambulatory Visit: Payer: Self-pay | Admitting: Internal Medicine

## 2015-07-20 ENCOUNTER — Ambulatory Visit (INDEPENDENT_AMBULATORY_CARE_PROVIDER_SITE_OTHER): Payer: Medicare Other | Admitting: General Practice

## 2015-07-20 DIAGNOSIS — Z5181 Encounter for therapeutic drug level monitoring: Secondary | ICD-10-CM

## 2015-07-20 LAB — POCT INR: INR: 1.6

## 2015-07-20 NOTE — Progress Notes (Signed)
I have reviewed and agree with the plan. 

## 2015-07-20 NOTE — Progress Notes (Signed)
Pre visit review using our clinic review tool, if applicable. No additional management support is needed unless otherwise documented below in the visit note. 

## 2015-07-29 ENCOUNTER — Ambulatory Visit (INDEPENDENT_AMBULATORY_CARE_PROVIDER_SITE_OTHER): Payer: Medicare Other | Admitting: Internal Medicine

## 2015-07-29 ENCOUNTER — Encounter: Payer: Self-pay | Admitting: Internal Medicine

## 2015-07-29 ENCOUNTER — Telehealth: Payer: Self-pay

## 2015-07-29 VITALS — BP 120/72 | HR 84 | Temp 97.8°F | Wt 151.0 lb

## 2015-07-29 DIAGNOSIS — I10 Essential (primary) hypertension: Secondary | ICD-10-CM | POA: Diagnosis not present

## 2015-07-29 DIAGNOSIS — J449 Chronic obstructive pulmonary disease, unspecified: Secondary | ICD-10-CM

## 2015-07-29 DIAGNOSIS — J471 Bronchiectasis with (acute) exacerbation: Secondary | ICD-10-CM | POA: Diagnosis not present

## 2015-07-29 MED ORDER — CEFDINIR 300 MG PO CAPS: 300.0000 mg | ORAL_CAPSULE | Freq: Two times a day (BID) | ORAL | Status: DC

## 2015-07-29 MED ORDER — IPRATROPIUM-ALBUTEROL 0.5-2.5 (3) MG/3ML IN SOLN: 3.0000 mL | Freq: Four times a day (QID) | RESPIRATORY_TRACT | Status: DC | PRN

## 2015-07-29 NOTE — Assessment & Plan Note (Signed)
For neb machine, and duonebs if covered by insurannce such as part B medicare

## 2015-07-29 NOTE — Assessment & Plan Note (Signed)
stable overall by history and exam, recent data reviewed with pt, and pt to continue medical treatment as before,  to f/u any worsening symptoms or concerns BP Readings from Last 3 Encounters:  07/29/15 120/72  03/21/15 162/70  01/10/15 140/60

## 2015-07-29 NOTE — Telephone Encounter (Signed)
[facility]----need new rx for nebulizer sent in order for insurance to cover---dr [name] has prescribed new script for duoneb---[facility] per patient request

## 2015-07-29 NOTE — Assessment & Plan Note (Signed)
Mild to mod, for antibx course,  to f/u any worsening symptoms or concerns 

## 2015-07-29 NOTE — Addendum Note (Signed)
Addended by: Earnstine Regal on: 07/29/2015 04:51 PM   Modules accepted: Orders

## 2015-07-29 NOTE — Patient Instructions (Addendum)
Please take all new medication as prescribed - the antibiotic, and the nebulizer medications as needed (up to 4 times per day) -   We will also ask [employer] to provide a Nebulizer machine (an order to be faxed)  Please continue all other medications as before, and refills have been done if requested.  Please have the pharmacy call with any other refills you may need.  Please continue your efforts at being more active, low cholesterol diet, and weight control.  Please keep your appointments with your specialists as you may have planned

## 2015-07-29 NOTE — Progress Notes (Signed)
Subjective:    Patient ID: [name], female    DOB: Jan 26, 1930, 81 y.o.   MRN: [id number]  HPI  Here with acute onset mild to mod 2 wks, ST, HA, general weakness and malaise, with prod cough greenish sputum, but Pt denies chest pain, increased sob or doe, wheezing, orthopnea, PND, increased LE swelling, palpitations, dizziness or syncope, except for mild sob and wheezing starting last pm.  Pt denies new neurological symptoms such as new headache, or facial or extremity weakness or numbness   Pt denies polydipsia, polyuria.  Asks for neb machine and neb tx as MDI not working as well. Past Medical History  Diagnosis Date  . HYPERLIPIDEMIA   . ANXIETY   . DEPRESSION   . COMMON MIGRAINE   . Unspecified hearing loss   . HYPERTENSION   . CORONARY ARTERY DISEASE   . PULMONARY HYPERTENSION, SECONDARY   . CAROTID ARTERY STENOSIS, RIGHT   . Chronic rhinitis   . ALLERGIC RHINITIS   . COPD   . GERD   . DIVERTICULOSIS, COLON   . CYST/PSEUDOCYST, PANCREAS   . OVERACTIVE BLADDER   . OSTEOPOROSIS   . DIZZINESS, CHRONIC   . Pulmonary embolism ([place]) 2008  . Bronchiectasis 12/11/2011    CT chest dx  . Esophageal stricture   . Complication of anesthesia     dizzy  . Stroke ([place])   . Tubular adenoma of colon 10/2013   Past Surgical History  Procedure Laterality Date  . Abdominal hysterectomy  1965  . Egd  1999  . Cholecystectomy  1963  . Tonsillectomy    . Appendectomy  1963  . S/p sinus surgury    . Pancreatic cyst drainage      x 2  . Colonoscopy N/A 10/19/2013    Procedure: COLONOSCOPY;  Surgeon: Ladene Artist, MD;  Location: [facility];  Service: Endoscopy;  Laterality: N/A;    reports that she has never smoked. She has never used smokeless tobacco. She reports that she does not drink alcohol or use illicit drugs. family history includes Breast cancer in her daughter; Emphysema in her father; Heart disease in her mother; Lymphoma in her sister; Stroke in her  father. Allergies  Allergen Reactions  . Amoxicillin-Pot Clavulanate     REACTION: diarrhea  . Ciclesonide     REACTION: bad smell  . Ciprofloxacin Other (See Comments)    dizziness  . Raloxifene     REACTION: risk of recurrent stroke  . Sulfonamide Derivatives     REACTION: tongue swells   Current Outpatient Prescriptions on File Prior to Visit  Medication Sig Dispense Refill  . albuterol (PROVENTIL HFA;VENTOLIN HFA) 108 (90 BASE) MCG/ACT inhaler Inhale 2 puffs into the lungs every 6 (six) hours as needed for wheezing or shortness of breath. 1 Inhaler 11  . amLODipine (NORVASC) 5 MG tablet TAKE 1 TABLET EVERY DAY 90 tablet 1  . aspirin 81 MG EC tablet Take 81 mg by mouth daily.     . calcium carbonate (TUMS) 500 MG chewable tablet Chew 1 tablet by mouth daily as needed.      . chlorpheniramine-HYDROcodone (TUSSIONEX PENNKINETIC ER) 10-8 MG/5ML LQCR Take 5 mLs by mouth every 12 (twelve) hours as needed for cough. 115 mL 0  . Digestive Enzymes (PAPAYA AND ENZYMES) CHEW Chew 2 tablets by mouth 3 (three) times daily.     [name] loratadine (CLARITIN) 10 MG tablet Take 10 mg by mouth daily.      [name]  losartan (COZAAR) 100 MG tablet TAKE 1 TABLET DAILY. 90 tablet 1  . meclizine (ANTIVERT) 12.5 MG tablet Take 1 tablet (12.5 mg total) by mouth 3 (three) times daily as needed for dizziness. 40 tablet 3  . mometasone (NASONEX) 50 MCG/ACT nasal spray Place 2 sprays into the nose daily as needed.     . Multiple Vitamins-Minerals (MACUVITE) TABS Take 1 tablet by mouth daily.      . pantoprazole (PROTONIX) 40 MG tablet Take 1 tablet (40 mg total) by mouth 2 (two) times daily. 180 tablet 3  . Probiotic Product (PROBIOTIC DAILY PO) Take 1 tablet by mouth daily.    [name] warfarin (COUMADIN) 5 MG tablet TAKE AS DIRECTED BY ANTICOAGULATION CLINIC. 90 tablet 0   No current facility-administered medications on file prior to visit.   Review of Systems  Constitutional: Negative for unusual diaphoresis or night  sweats HENT: Negative for ringing in ear or discharge Eyes: Negative for double vision or worsening visual disturbance.  Respiratory: Negative for choking and stridor.   Gastrointestinal: Negative for vomiting or other signifcant bowel change Genitourinary: Negative for hematuria or change in urine volume.  Musculoskeletal: Negative for other MSK pain or swelling Skin: Negative for color change and worsening wound.  Neurological: Negative for tremors and numbness other than noted  Psychiatric/Behavioral: Negative for decreased concentration or agitation other than above       Objective:   Physical Exam BP 120/72 mmHg  Pulse 84  Temp(Src) 97.8 F (36.6 C)  Wt 151 lb (68.493 kg)  SpO2 90% VS noted,  Constitutional: Pt appears in no significant distress HENT: Head: NCAT.  Right Ear: External ear normal.  Left Ear: External ear normal.  Eyes: . Pupils are equal, round, and reactive to light. Conjunctivae and EOM are normal Neck: Normal range of motion. Neck supple.  Cardiovascular: Normal rate and regular rhythm.   Pulmonary/Chest: Effort normal and breath sounds decreased bilat with few RLL rales. No wheezing.  Neurological: Pt is alert. Not confused , motor grossly intact Skin: Skin is warm. No rash, no LE edema Psychiatric: Pt behavior is normal. No agitation.     Assessment & Plan:

## 2015-07-29 NOTE — Progress Notes (Signed)
Pre visit review using our clinic review tool, if applicable. No additional management support is needed unless otherwise documented below in the visit note. 

## 2015-08-02 DIAGNOSIS — M1712 Unilateral primary osteoarthritis, left knee: Secondary | ICD-10-CM | POA: Diagnosis not present

## 2015-08-03 ENCOUNTER — Encounter: Payer: Self-pay | Admitting: *Deleted

## 2015-08-09 DIAGNOSIS — M1712 Unilateral primary osteoarthritis, left knee: Secondary | ICD-10-CM | POA: Diagnosis not present

## 2015-08-09 DIAGNOSIS — Z23 Encounter for immunization: Secondary | ICD-10-CM | POA: Diagnosis not present

## 2015-08-17 ENCOUNTER — Ambulatory Visit: Payer: Medicare Other

## 2015-08-23 ENCOUNTER — Ambulatory Visit: Payer: Medicare Other

## 2015-08-31 ENCOUNTER — Telehealth: Payer: Self-pay | Admitting: General Practice

## 2015-08-31 NOTE — Telephone Encounter (Signed)
-----   Message from [name], MD sent at 08/31/2015  5:00 AM EST ----- Regarding: RE: Lovenox bridge Yes, please provide lovenox bridge. thanks ----- Message -----    From: Warden Fillers, RN    Sent: 08/30/2015  12:49 PM      To: [name], MD Subject: Lovenox bridge                                 Hi Dr. [name],  Patient needs to stop coumadin for 5 days for a spinal procedure.  Do you want her to be bridged with Lovenox?  Hx of PE and TIA.  Thanks, [place]

## 2015-09-05 DIAGNOSIS — S32050A Wedge compression fracture of fifth lumbar vertebra, initial encounter for closed fracture: Secondary | ICD-10-CM | POA: Diagnosis not present

## 2015-09-06 ENCOUNTER — Telehealth: Payer: Self-pay | Admitting: General Practice

## 2015-09-06 NOTE — Telephone Encounter (Signed)
-----   Message from [name], MD sent at 08/31/2015  5:00 AM EST ----- Regarding: RE: Lovenox bridge Yes, please provide lovenox bridge. thanks ----- Message -----    From: Warden Fillers, RN    Sent: 08/30/2015  12:49 PM      To: [name], MD Subject: Lovenox bridge                                 Hi Dr. [name],  Patient needs to stop coumadin for 5 days for a spinal procedure.  Do you want her to be bridged with Lovenox?  Hx of PE and TIA.  Thanks, [place]

## 2015-09-07 ENCOUNTER — Ambulatory Visit (INDEPENDENT_AMBULATORY_CARE_PROVIDER_SITE_OTHER): Payer: Medicare Other | Admitting: General Practice

## 2015-09-07 DIAGNOSIS — I2699 Other pulmonary embolism without acute cor pulmonale: Secondary | ICD-10-CM

## 2015-09-07 DIAGNOSIS — Z5181 Encounter for therapeutic drug level monitoring: Secondary | ICD-10-CM | POA: Diagnosis not present

## 2015-09-07 DIAGNOSIS — Z86718 Personal history of other venous thrombosis and embolism: Secondary | ICD-10-CM

## 2015-09-07 LAB — POCT INR: INR: 2.7

## 2015-09-07 NOTE — Progress Notes (Signed)
I have reviewed and agree with the plan. 

## 2015-09-07 NOTE — Progress Notes (Signed)
Pre visit review using our clinic review tool, if applicable. No additional management support is needed unless otherwise documented below in the visit note. 

## 2015-09-19 DIAGNOSIS — S32050D Wedge compression fracture of fifth lumbar vertebra, subsequent encounter for fracture with routine healing: Secondary | ICD-10-CM | POA: Diagnosis not present

## 2015-10-12 ENCOUNTER — Ambulatory Visit: Payer: Medicare Other

## 2015-10-12 DIAGNOSIS — M810 Age-related osteoporosis without current pathological fracture: Secondary | ICD-10-CM | POA: Diagnosis not present

## 2015-10-12 DIAGNOSIS — R5383 Other fatigue: Secondary | ICD-10-CM | POA: Diagnosis not present

## 2015-10-12 DIAGNOSIS — Z780 Asymptomatic menopausal state: Secondary | ICD-10-CM | POA: Diagnosis not present

## 2015-10-12 DIAGNOSIS — E559 Vitamin D deficiency, unspecified: Secondary | ICD-10-CM | POA: Diagnosis not present

## 2015-10-19 DIAGNOSIS — S32050D Wedge compression fracture of fifth lumbar vertebra, subsequent encounter for fracture with routine healing: Secondary | ICD-10-CM | POA: Diagnosis not present

## 2015-10-25 ENCOUNTER — Emergency Department (HOSPITAL_COMMUNITY): Payer: Medicare Other

## 2015-10-25 ENCOUNTER — Encounter (HOSPITAL_COMMUNITY): Payer: Self-pay | Admitting: Emergency Medicine

## 2015-10-25 ENCOUNTER — Emergency Department (HOSPITAL_COMMUNITY)
Admission: EM | Admit: 2015-10-25 | Discharge: 2015-10-25 | Disposition: A | Discharge status: Home / Self Care | Payer: Medicare Other | Attending: Emergency Medicine | Admitting: Emergency Medicine

## 2015-10-25 DIAGNOSIS — R0602 Shortness of breath: Secondary | ICD-10-CM | POA: Diagnosis present

## 2015-10-25 DIAGNOSIS — Z79899 Other long term (current) drug therapy: Secondary | ICD-10-CM | POA: Diagnosis not present

## 2015-10-25 DIAGNOSIS — Z87448 Personal history of other diseases of urinary system: Secondary | ICD-10-CM | POA: Insufficient documentation

## 2015-10-25 DIAGNOSIS — Z7951 Long term (current) use of inhaled steroids: Secondary | ICD-10-CM | POA: Diagnosis not present

## 2015-10-25 DIAGNOSIS — G43009 Migraine without aura, not intractable, without status migrainosus: Secondary | ICD-10-CM | POA: Diagnosis not present

## 2015-10-25 DIAGNOSIS — Z8673 Personal history of transient ischemic attack (TIA), and cerebral infarction without residual deficits: Secondary | ICD-10-CM | POA: Diagnosis not present

## 2015-10-25 DIAGNOSIS — I2510 Atherosclerotic heart disease of native coronary artery without angina pectoris: Secondary | ICD-10-CM | POA: Diagnosis not present

## 2015-10-25 DIAGNOSIS — M810 Age-related osteoporosis without current pathological fracture: Secondary | ICD-10-CM | POA: Diagnosis not present

## 2015-10-25 DIAGNOSIS — Z86711 Personal history of pulmonary embolism: Secondary | ICD-10-CM | POA: Insufficient documentation

## 2015-10-25 DIAGNOSIS — Z7901 Long term (current) use of anticoagulants: Secondary | ICD-10-CM | POA: Insufficient documentation

## 2015-10-25 DIAGNOSIS — J470 Bronchiectasis with acute lower respiratory infection: Secondary | ICD-10-CM | POA: Diagnosis not present

## 2015-10-25 DIAGNOSIS — K219 Gastro-esophageal reflux disease without esophagitis: Secondary | ICD-10-CM | POA: Diagnosis not present

## 2015-10-25 DIAGNOSIS — Z7982 Long term (current) use of aspirin: Secondary | ICD-10-CM | POA: Insufficient documentation

## 2015-10-25 DIAGNOSIS — Z862 Personal history of diseases of the blood and blood-forming organs and certain disorders involving the immune mechanism: Secondary | ICD-10-CM | POA: Diagnosis not present

## 2015-10-25 DIAGNOSIS — Z880 Allergy status to penicillin: Secondary | ICD-10-CM | POA: Diagnosis not present

## 2015-10-25 DIAGNOSIS — Z8639 Personal history of other endocrine, nutritional and metabolic disease: Secondary | ICD-10-CM | POA: Insufficient documentation

## 2015-10-25 DIAGNOSIS — R091 Pleurisy: Secondary | ICD-10-CM | POA: Diagnosis not present

## 2015-10-25 DIAGNOSIS — H9190 Unspecified hearing loss, unspecified ear: Secondary | ICD-10-CM | POA: Insufficient documentation

## 2015-10-25 DIAGNOSIS — I10 Essential (primary) hypertension: Secondary | ICD-10-CM | POA: Insufficient documentation

## 2015-10-25 DIAGNOSIS — Z8659 Personal history of other mental and behavioral disorders: Secondary | ICD-10-CM | POA: Diagnosis not present

## 2015-10-25 LAB — CBC
HCT: 42.1 % (ref 36.0–46.0)
HEMOGLOBIN: 14.5 g/dL (ref 12.0–15.0)
MCH: 34.6 pg — AB (ref 26.0–34.0)
MCHC: 34.4 g/dL (ref 30.0–36.0)
MCV: 100.5 fL — ABNORMAL HIGH (ref 78.0–100.0)
Platelets: 294 10*3/uL (ref 150–400)
RBC: 4.19 MIL/uL (ref 3.87–5.11)
RDW: 13.4 % (ref 11.5–15.5)
WBC: 9.3 10*3/uL (ref 4.0–10.5)

## 2015-10-25 LAB — BASIC METABOLIC PANEL
ANION GAP: 10 (ref 5–15)
BUN: 15 mg/dL (ref 6–20)
CALCIUM: 9.3 mg/dL (ref 8.9–10.3)
CHLORIDE: 100 mmol/L — AB (ref 101–111)
CO2: 25 mmol/L (ref 22–32)
Creatinine, Ser: 0.6 mg/dL (ref 0.44–1.00)
GFR calc Af Amer: >60 mL/min (ref >60)
GFR calc non Af Amer: >60 mL/min (ref >60)
Glucose, Bld: 129 mg/dL — ABNORMAL HIGH (ref 65–99)
POTASSIUM: 4.3 mmol/L (ref 3.5–5.1)
Sodium: 135 mmol/L (ref 135–145)

## 2015-10-25 LAB — I-STAT TROPONIN, ED: TROPONIN I, POC: 0.01 ng/mL (ref 0.00–0.08)

## 2015-10-25 LAB — PROTIME-INR
INR: 1.35 (ref 0.00–1.49)
Prothrombin Time: 16.8 seconds — ABNORMAL HIGH (ref 11.6–15.2)

## 2015-10-25 MED ORDER — AZITHROMYCIN 250 MG PO TABS
500.0000 mg | ORAL_TABLET | Freq: Once | ORAL | Status: AC
  Administered 2015-10-25: 500 mg via ORAL
  Filled 2015-10-25: qty 2

## 2015-10-25 MED ORDER — IOHEXOL 350 MG/ML SOLN
100.0000 mL | Freq: Once | INTRAVENOUS | Status: AC | PRN
  Administered 2015-10-25: 100 mL via INTRAVENOUS

## 2015-10-25 MED ORDER — HYDROCODONE-ACETAMINOPHEN 5-325 MG PO TABS
1.0000 | ORAL_TABLET | Freq: Once | ORAL | Status: AC
  Administered 2015-10-25: 1 via ORAL
  Filled 2015-10-25: qty 1

## 2015-10-25 MED ORDER — HYDROCODONE-ACETAMINOPHEN 5-325 MG PO TABS: 1.0000 | ORAL_TABLET | ORAL | Status: DC | PRN

## 2015-10-25 MED ORDER — AZITHROMYCIN 250 MG PO TABS: 250.0000 mg | ORAL_TABLET | Freq: Every day | ORAL | Status: DC

## 2015-10-25 NOTE — Discharge Instructions (Signed)
Bronchiectasis Bronchiectasis is a condition in which the airways (bronchi) are damaged and widened. This makes it difficult for the lungs to get rid of mucus. As a result, mucus gathers in the airways, and this often leads to lung infections. Infection can cause inflammation in the airways, which may further weaken and damage the bronchi.  CAUSES  Bronchiectasis may be present at birth (congenital) or may develop later in life. Sometimes there is no apparent cause. Some common causes include:  Cystic fibrosis.   Recurrent lung infections (such as pneumonia, tuberculosis, or fungal infections).  Foreign bodies or other blockages in the lungs.  Breathing in fluid, food, or other foreign objects (aspiration). SIGNS AND SYMPTOMS  Common symptoms include:  A daily cough that brings up mucus and lasts for more than 3 weeks.  Frequent lung infections (such as pneumonia, tuberculosis, or fungal infections).  Shortness of breath and wheezing.   Weakness and fatigue. DIAGNOSIS  Various tests may be done to help diagnose bronchiectasis. Tests may include:  Chest X-rays or CT scans.   Breathing tests to help determine how your lungs are working.   Sputum cultures to check for infection.   Blood tests and other tests to check for related diseases or causes, such as cystic fibrosis. TREATMENT  Treatment varies depending on the severity of the condition. Medicines may be given to loosen the mucus to be coughed up (expectorants), to relax the muscles of the air passages (bronchodilators), or to prevent or treat infections (antibiotics). Physical therapy methods may be recommended to help clear mucus from the lungs. For severe cases, surgery may be done to remove the affected part of the lung. HOME CARE INSTRUCTIONS   Get plenty of rest.   Only take over-the-counter or prescription medicines as directed by your health care provider. If antibiotic medicines were prescribed, take them as  directed. Finish them even if you start to feel better.  Avoid sedatives and antihistamines unless otherwise directed by your health care provider. These medicines tend to thicken the mucus in the lungs.   Perform any breathing exercises or techniques to clear the lungs as directed by your health care provider.  Drink enough fluids to keep your urine clear or pale yellow.  Consider using a cold steam vaporizer or humidifier in your room or home to help loosen secretions.   If the cough is worse at night, try sleeping in a semi-upright position in a recliner or using a couple of pillows.   Avoid cigarette smoke and lung irritants. If you smoke, quit.  Stay inside when pollution and ozone levels are high.   Stay current with vaccinations and immunizations.   Follow up with your health care provider as directed.  SEEK MEDICAL CARE IF:  You cough up more thick, discolored mucus (sputum) that is yellow to green in color.  You have a fever or persistent symptoms for more than 2-3 days.  You cannot control your cough and are losing sleep. SEEK IMMEDIATE MEDICAL CARE IF:   You cough up blood.   You have chest pain or increasing shortness of breath.   You have pain that is getting worse or is uncontrolled with medicines.   You have a fever and your symptoms suddenly get worse. MAKE SURE YOU:  Understand these instructions.   Will watch your condition.   Will get help right away if you are not doing well or get worse.    This information is not intended to replace advice given to   you by your health care provider. Make sure you discuss any questions you have with your health care provider.   Document Released: 07/22/2007 Document Revised: 09/29/2013 Document Reviewed: 04/01/2013 Elsevier Interactive Patient Education 2016 [employer]  

## 2015-10-25 NOTE — ED Notes (Signed)
Woke up this morning with right sided back pain that's worse with deep inspiration. Pt states she does feel more SOB than her usual, O2 sats improved from 90% on RA to 96% with 3L O2. Denies fever/chills, N/V/D. Hx of PE, on warfarin. States she has been coughing x 2 weeks.

## 2015-10-25 NOTE — ED Provider Notes (Signed)
CSN: [id number]     Arrival date & time 10/25/15  1152 History   First MD Initiated Contact with Patient 10/25/15 1502     Chief Complaint  Patient presents with  . Shortness of Breath  . Back Pain     (Consider location/radiation/quality/duration/timing/severity/associated sxs/prior Treatment) HPI Developed sharp chest pain in the right posterior chest this morning. Sudden onset. Pain with deep inspiration. Patient reports feeling more short of breath. She has had a cough for approximately 2 weeks. Sometimes productive of green sputum. Patient has not had a fever. She does report some URI symptoms at onset. Positive history of PE 2. No lower extremity swelling or pain. Patient takes Coumadin. Past Medical History  Diagnosis Date  . HYPERLIPIDEMIA   . ANXIETY   . DEPRESSION   . COMMON MIGRAINE   . Unspecified hearing loss   . HYPERTENSION   . CORONARY ARTERY DISEASE   . PULMONARY HYPERTENSION, SECONDARY   . CAROTID ARTERY STENOSIS, RIGHT   . Chronic rhinitis   . ALLERGIC RHINITIS   . COPD   . GERD   . DIVERTICULOSIS, COLON   . CYST/PSEUDOCYST, PANCREAS   . OVERACTIVE BLADDER   . OSTEOPOROSIS   . DIZZINESS, CHRONIC   . Pulmonary embolism ([place]) 2008  . Bronchiectasis 12/11/2011    CT chest dx  . Esophageal stricture   . Complication of anesthesia     dizzy  . Stroke ([place])   . Tubular adenoma of colon 10/2013   Past Surgical History  Procedure Laterality Date  . Abdominal hysterectomy  1965  . Egd  1999  . Cholecystectomy  1963  . Tonsillectomy    . Appendectomy  1963  . S/p sinus surgury    . Pancreatic cyst drainage      x 2  . Colonoscopy N/A 10/19/2013    Procedure: COLONOSCOPY;  Surgeon: Ladene Artist, MD;  Location: [facility];  Service: Endoscopy;  Laterality: N/A;   Family History  Problem Relation Age of Onset  . Breast cancer Daughter   . Emphysema Father     was a smoker  . Lymphoma Sister   . Stroke Father   . Heart disease Mother    Social  History  Substance Use Topics  . Smoking status: Never Smoker   . Smokeless tobacco: Never Used     Comment: spouse smoked in the home for 15 years  . Alcohol Use: No   OB History    No data available     Review of Systems  10 Systems reviewed and are negative for acute change except as noted in the HPI.   Allergies  Amoxicillin-pot clavulanate; Ciclesonide; Ciprofloxacin; Raloxifene; and Sulfonamide derivatives  Home Medications   Prior to Admission medications   Medication Sig Start Date End Date Taking? Authorizing Provider  albuterol (PROVENTIL HFA;VENTOLIN HFA) 108 (90 BASE) MCG/ACT inhaler Inhale 2 puffs into the lungs every 6 (six) hours as needed for wheezing or shortness of breath. 09/22/14  Yes [name], MD  amLODipine (NORVASC) 5 MG tablet TAKE 1 TABLET EVERY DAY 07/20/15  Yes [name], MD  aspirin 81 MG EC tablet Take 81 mg by mouth daily.    Yes Historical Provider, MD  calcium carbonate (TUMS) 500 MG chewable tablet Chew 1 tablet by mouth daily as needed.     Yes Historical Provider, MD  chlorpheniramine-HYDROcodone (TUSSIONEX PENNKINETIC ER) 10-8 MG/5ML LQCR Take 5 mLs by mouth every 12 (twelve) hours as needed for cough.  08/03/14  Yes [name], MD  Digestive Enzymes (PAPAYA AND ENZYMES) CHEW Chew 2 tablets by mouth 3 (three) times daily with meals as needed (digestion of big meals).    Yes Historical Provider, MD  ipratropium-albuterol (DUONEB) 0.5-2.5 (3) MG/3ML SOLN Take 3 mLs by nebulization every 6 (six) hours as needed. 07/29/15  Yes [name], MD  loratadine (CLARITIN) 10 MG tablet Take 10 mg by mouth daily as needed for allergies.    Yes Historical Provider, MD  losartan (COZAAR) 100 MG tablet TAKE 1 TABLET DAILY. 07/20/15  Yes [name], MD  meclizine (ANTIVERT) 12.5 MG tablet Take 1 tablet (12.5 mg total) by mouth 3 (three) times daily as needed for dizziness. 07/01/14  Yes [name], MD  mometasone (NASONEX) 50 MCG/ACT nasal spray Place 2  sprays into the nose daily as needed (allergies).    Yes Historical Provider, MD  Multiple Vitamins-Minerals (MACUVITE) TABS Take 1 tablet by mouth daily.     Yes Historical Provider, MD  pantoprazole (PROTONIX) 40 MG tablet Take 1 tablet (40 mg total) by mouth 2 (two) times daily. Patient taking differently: Take 40 mg by mouth 2 (two) times daily as needed (heartburn/acid reflux).  01/10/15  Yes Ladene Artist, MD  Probiotic Product (PROBIOTIC DAILY PO) Take 1 tablet by mouth daily.   Yes Historical Provider, MD  warfarin (COUMADIN) 5 MG tablet TAKE AS DIRECTED BY ANTICOAGULATION CLINIC. Patient taking differently: take 2.5mg  by mouth once daily 04/19/15  Yes [name], MD  azithromycin (ZITHROMAX) 250 MG tablet Take 1 tablet (250 mg total) by mouth daily. 10/25/15   Charlesetta Shanks, MD  cefdinir (OMNICEF) 300 MG capsule Take 1 capsule (300 mg total) by mouth 2 (two) times daily. Patient not taking: Reported on 10/25/2015 07/29/15   [name], MD  HYDROcodone-acetaminophen (NORCO/VICODIN) 5-325 MG tablet Take 1-2 tablets by mouth every 4 (four) hours as needed for moderate pain or severe pain. 10/25/15   Charlesetta Shanks, MD   BP 141/73 mmHg  Pulse 77  Temp(Src) 98.1 F (36.7 C) (Oral)  Resp 18  SpO2 97% Physical Exam  Constitutional: She is oriented to person, place, and time. She appears well-developed and well-nourished.  HENT:  Head: Normocephalic and atraumatic.  Eyes: EOM are normal. Pupils are equal, round, and reactive to light.  Neck: Neck supple.  Cardiovascular: Normal rate, regular rhythm, normal heart sounds and intact distal pulses.   Pulmonary/Chest: Effort normal and breath sounds normal.  No reproducible chest wall pain to palpation. Very small, shallow excoriation in the fold of the right breast. This is consistent with rubbing from broad and does not appear to be vesicular in nature.  Abdominal: Soft. Bowel sounds are normal. She exhibits no distension. There is no  tenderness.  Musculoskeletal: Normal range of motion. She exhibits no edema or tenderness.  Neurological: She is alert and oriented to person, place, and time. She has normal strength. She exhibits normal muscle tone. Coordination normal. GCS eye subscore is 4. GCS verbal subscore is 5. GCS motor subscore is 6.  Skin: Skin is warm, dry and intact.  Psychiatric: She has a normal mood and affect.    ED Course  Procedures (including critical care time) Labs Review Labs Reviewed  BASIC METABOLIC PANEL - Abnormal; Notable for the following:    Chloride 100 (*)    Glucose, Bld 129 (*)    All other components within normal limits  CBC - Abnormal; Notable for  the following:    MCV 100.5 (*)    MCH 34.6 (*)    All other components within normal limits  PROTIME-INR - Abnormal; Notable for the following:    Prothrombin Time 16.8 (*)    All other components within normal limits  I-STAT TROPOININ, ED    Imaging Review Dg Chest 2 View  10/25/2015  CLINICAL DATA:  Shortness of breath and right side chest pain with inspiration beginning this morning. Initial encounter. EXAM: CHEST  2 VIEW COMPARISON:  PA and lateral chest 11/19/2014 and 02/06/2014. FINDINGS: The lungs are clear. Heart size normal. There is no pneumothorax or pleural effusion. Atherosclerotic vascular disease noted. No focal bony abnormality. IMPRESSION: No acute disease. Electronically Signed   By: Inge Rise M.D.   On: 10/25/2015 13:31   Ct Angio Chest Pe W/cm &/or Wo Cm  10/25/2015  CLINICAL DATA:  Right-sided back pain. Shortness of breath. History of pulmonary emboli. EXAM: CT ANGIOGRAPHY CHEST WITH CONTRAST TECHNIQUE: Multidetector CT imaging of the chest was performed using the standard protocol during bolus administration of intravenous contrast. Multiplanar CT image reconstructions and MIPs were obtained to evaluate the vascular anatomy. CONTRAST:  138mL OMNIPAQUE IOHEXOL 350 MG/ML SOLN COMPARISON:  December 11, 2011 CT scan  FINDINGS: The trachea and mainstem bronchi are normal and unchanged. Bronchiectasis is identified in the right middle and lower lobes. Multiple nodules are seen as well, particularly in the right middle lobe, lingula, and right lower lobe. Tree in bud opacities are identified as well. There are opacified airway is in the right middle lobe and right lower lobe, most likely due to inspissated mucus secondary to bronchiectasis. These findings have increased in the interval. There is also opacity in the right middle lobe medially consistent with atelectasis. No suspicious masses or acute infiltrates. Mixed attenuation of the lungs with alternating areas of increased and decreased attenuation could represent air trapping, especially in the light of obvious airways disease. The patient however does have a history of previous pulmonary emboli which could lead to a similar appearance. Atherosclerotic changes seen in the thoracic aorta. No aneurysm or dissection. There is a small hiatal hernia. Coronary artery calcifications. The heart is unchanged. A linear density projected in a right pulmonary artery near the hilum is consistent with sequela of previous pulmonary emboli. No acute emboli identified on today's study. No acute pulmonary emboli identified on today's study. Evaluation of the upper abdomen is limited but unremarkable. No acute bony abnormalities. Review of the MIP images confirms the above findings. IMPRESSION: 1. No acute pulmonary emboli. 2. The nodularity and bronchiectasis in the lungs is consistent with chronic MAI infection. Electronically Signed   By: Dorise Bullion III M.D   On: 10/25/2015 16:40   I have personally reviewed and evaluated these images and lab results as part of my medical decision-making.   EKG Interpretation   Date/Time:  Tuesday October 25 2015 12:45:17 EST Ventricular Rate:  79 PR Interval:  149 QRS Duration: 73 QT Interval:  367 QTC Calculation: 421 R Axis:   71 Text  Interpretation:  Sinus rhythm Atrial premature complex no STEMI, no  change from old. Confirmed by Johnney Killian, MD, Jeannie Done [phone]) on 10/25/2015  3:06:32 PM      MDM   Final diagnoses:  Bronchiectasis with acute lower respiratory infection ([place])  Pleurisy   Patient presented to 2 weeks of cough. Vital signs are stable. Pulmonary embolus has been ruled out. Patient does have history of bronchiectasis. Consolidation is identified  on chest CT. She does not have fever or general toxic appearance. Patient has been tolerating oral intake. At this time I do feel she is safe to initiate outpatient treatment. She does see the pulmonologist and is advised to follow-up this week.    Charlesetta Shanks, MD 10/25/15 [phone]

## 2015-10-25 NOTE — ED Notes (Signed)
Bed: WA01 Expected date:  Expected time:  Means of arrival:  Comments: TR9

## 2015-10-26 ENCOUNTER — Ambulatory Visit: Payer: Medicare Other

## 2015-10-26 ENCOUNTER — Ambulatory Visit: Payer: Medicare Other | Admitting: Internal Medicine

## 2015-10-26 ENCOUNTER — Telehealth: Payer: Self-pay | Admitting: Internal Medicine

## 2015-10-26 ENCOUNTER — Ambulatory Visit (INDEPENDENT_AMBULATORY_CARE_PROVIDER_SITE_OTHER): Payer: Medicare Other | Admitting: General Practice

## 2015-10-26 DIAGNOSIS — Z5181 Encounter for therapeutic drug level monitoring: Secondary | ICD-10-CM

## 2015-10-26 NOTE — Progress Notes (Signed)
I have reviewed and agree with the plan. 

## 2015-10-26 NOTE — Progress Notes (Signed)
Pre visit review using our clinic review tool, if applicable. No additional management support is needed unless otherwise documented below in the visit note.  Patient called into the office today to inform that she went to ER yesterday and was diagnosed with PNA.  Patient was given Zithromax for 4 days.  INR was 1.35.    Boosted patient for 2 days and resumed current dosage of coumadin.  Check INR in 1 week.

## 2015-10-26 NOTE — Telephone Encounter (Signed)
Pt called to cancel her appointment today because she went to ED yesterday. She would like for you to call her at [phone]

## 2015-11-02 ENCOUNTER — Encounter: Payer: Self-pay | Admitting: Adult Health

## 2015-11-02 ENCOUNTER — Telehealth: Payer: Self-pay | Admitting: Pharmacist

## 2015-11-02 ENCOUNTER — Ambulatory Visit (INDEPENDENT_AMBULATORY_CARE_PROVIDER_SITE_OTHER): Payer: Medicare Other | Admitting: General Practice

## 2015-11-02 ENCOUNTER — Ambulatory Visit (INDEPENDENT_AMBULATORY_CARE_PROVIDER_SITE_OTHER): Payer: Medicare Other | Admitting: Adult Health

## 2015-11-02 VITALS — BP 148/90 | HR 85 | Ht 65.0 in | Wt 154.4 lb

## 2015-11-02 DIAGNOSIS — J471 Bronchiectasis with (acute) exacerbation: Secondary | ICD-10-CM

## 2015-11-02 DIAGNOSIS — Z5181 Encounter for therapeutic drug level monitoring: Secondary | ICD-10-CM | POA: Diagnosis not present

## 2015-11-02 DIAGNOSIS — Z7901 Long term (current) use of anticoagulants: Secondary | ICD-10-CM | POA: Diagnosis not present

## 2015-11-02 DIAGNOSIS — I2699 Other pulmonary embolism without acute cor pulmonale: Secondary | ICD-10-CM

## 2015-11-02 LAB — POCT INR: INR: 2.2

## 2015-11-02 MED ORDER — FLUTTER DEVI: Status: DC

## 2015-11-02 MED ORDER — LEVOFLOXACIN 500 MG PO TABS: 500.0000 mg | ORAL_TABLET | Freq: Every day | ORAL | Status: DC

## 2015-11-02 NOTE — Progress Notes (Signed)
Pre visit review using our clinic review tool, if applicable. No additional management support is needed unless otherwise documented below in the visit note. 

## 2015-11-02 NOTE — Telephone Encounter (Signed)
Called to reschedule INR check as will be starting Levaquin this evening per Tracy/Kathryn with pulmonology.

## 2015-11-02 NOTE — Progress Notes (Signed)
Chief Complaint  Patient presents with  . Acute Visit    Seen in ED 1/17 - pleurasy and bronchitis. Pt completed Zpak 10/29/15.      Tests   Past medical hx Past Medical History  Diagnosis Date  . HYPERLIPIDEMIA   . ANXIETY   . DEPRESSION   . COMMON MIGRAINE   . Unspecified hearing loss   . HYPERTENSION   . CORONARY ARTERY DISEASE   . PULMONARY HYPERTENSION, SECONDARY   . CAROTID ARTERY STENOSIS, RIGHT   . Chronic rhinitis   . ALLERGIC RHINITIS   . COPD   . GERD   . DIVERTICULOSIS, COLON   . CYST/PSEUDOCYST, PANCREAS   . OVERACTIVE BLADDER   . OSTEOPOROSIS   . DIZZINESS, CHRONIC   . Pulmonary embolism ([place]) 2008  . Bronchiectasis 12/11/2011    CT chest dx  . Esophageal stricture   . Complication of anesthesia     dizzy  . Stroke ([place])   . Tubular adenoma of colon 10/2013     Past surgical hx, Allergies, Family hx, Social hx all reviewed.  Current Outpatient Prescriptions on File Prior to Visit  Medication Sig  . albuterol (PROVENTIL HFA;VENTOLIN HFA) 108 (90 BASE) MCG/ACT inhaler Inhale 2 puffs into the lungs every 6 (six) hours as needed for wheezing or shortness of breath.  [name] amLODipine (NORVASC) 5 MG tablet TAKE 1 TABLET EVERY DAY  . aspirin 81 MG EC tablet Take 81 mg by mouth daily.   . calcium carbonate (TUMS) 500 MG chewable tablet Chew 1 tablet by mouth daily as needed.    . cefdinir (OMNICEF) 300 MG capsule Take 1 capsule (300 mg total) by mouth 2 (two) times daily.  . chlorpheniramine-HYDROcodone (TUSSIONEX PENNKINETIC ER) 10-8 MG/5ML LQCR Take 5 mLs by mouth every 12 (twelve) hours as needed for cough.  . Digestive Enzymes (PAPAYA AND ENZYMES) CHEW Chew 2 tablets by mouth 3 (three) times daily with meals as needed (digestion of big meals).   [name] ipratropium-albuterol (DUONEB) 0.5-2.5 (3) MG/3ML SOLN Take 3 mLs by nebulization every 6 (six) hours as needed.  . loratadine (CLARITIN) 10 MG tablet Take 10 mg by mouth daily as needed for allergies.   [name] losartan  (COZAAR) 100 MG tablet TAKE 1 TABLET DAILY.  . meclizine (ANTIVERT) 12.5 MG tablet Take 1 tablet (12.5 mg total) by mouth 3 (three) times daily as needed for dizziness.  . mometasone (NASONEX) 50 MCG/ACT nasal spray Place 2 sprays into the nose daily as needed (allergies).   . Multiple Vitamins-Minerals (MACUVITE) TABS Take 1 tablet by mouth daily.    . pantoprazole (PROTONIX) 40 MG tablet Take 1 tablet (40 mg total) by mouth 2 (two) times daily. (Patient taking differently: Take 40 mg by mouth 2 (two) times daily as needed (heartburn/acid reflux). )  . Probiotic Product (PROBIOTIC DAILY PO) Take 1 tablet by mouth daily.  [name] warfarin (COUMADIN) 5 MG tablet TAKE AS DIRECTED BY ANTICOAGULATION CLINIC. (Patient taking differently: take 2.5mg  by mouth once daily)   No current facility-administered medications on file prior to visit.     Vital Signs BP 148/90 mmHg  Pulse 85  Ht 5\' 5"  (1.651 m)  Wt 154 lb 6.4 oz (70.035 kg)  BMI 25.69 kg/m2  SpO2 92%  History of Present Illness [name] is a 82 y.o. female with hx PE on coumadin, bronchiectasis with moderate AFL on spirometry, allergic rhinitis and GERD.  She has not seen pulmonary since 2014.  She was seen  in ER 1/17 with c/o pleuritic chest pain, cough productive of green sputum and dyspnea.  She was treated for pleurisy and bronchiectasis flare.  She was discharged with Z-pac.  She felt better initially, but began feeling worse again yesterday 1/24 with cough and malaise. Cough is productive of green/yellow sputum. No fever, some occasional chills.  Denies hemoptysis, orthopnea, BLE edema. Some nasal congestion but improving.  No SOB at rest, does c/o some DOE.  Pleuritic chest pain is much improved with some residual "soreness" from coughing.      Physical Exam  General - Pleasant, well put together female, No distress  ENT - No sinus tenderness, no oral exudate, no LAN Cardiac - s1s2 regular, no murmur Chest - resps even non  labored on RA, slightly diminished bilateral bases otherwise essentially clear Back - No focal tenderness Abd - Soft, non-tender Ext - No edema Neuro - Normal strength Skin - No rashes Psych - normal mood, and behavior   Assessment/Plan  Bronchiectasis with flair  - initially improved slightly with Azithro but now with worsening cough/sputum production.  Will change to Levaquin x 7 days (discussed listed allergy to Cipro which is dizziness - she has taken levaquin in the past and tolerated).  Note she is on warfarin - she will need to f/u in coumadin clinic in 2-3 days.   Pulmonary hygiene, mucolytic.  Pleurisy - resolved.   Patient Instructions  You have a flair up of your bronchiectasis.   Levaquin 500mg  by mouth daily x 7 days  You will need to have your coumadin levels checked again in 2-3 days.  Flutter valve 4x daily  Mucinex twice daily   Follow up with Dr [name] in 2-3 weeks  Call or return sooner if needed.     Nickolas Madrid, NP 11/02/2015  11:22 AM Pager: [phone]

## 2015-11-02 NOTE — Patient Instructions (Signed)
You have a flair up of your bronchiectasis.   Levaquin 500mg  by mouth daily x 7 days  You will need to have your coumadin levels checked again in 2-3 days.  Flutter valve 4x daily  Mucinex twice daily   Follow up with Dr [name] in 2-3 weeks  Call or return sooner if needed.

## 2015-11-07 ENCOUNTER — Telehealth: Payer: Self-pay | Admitting: Emergency Medicine

## 2015-11-07 NOTE — Telephone Encounter (Signed)
Received this warning on Doxycycline and patient is on Warfarin.Madaline Brilliant to send?  Please advise.   Drug-Drug: warfarin and doxycycline  Hypoprothrombinemic effects of Anticoagulants may be increased by Tetracyclines. Bleeding may occur.

## 2015-11-07 NOTE — Telephone Encounter (Signed)
Please have her stop levaquin, start doxycycline 100mg  bid x 5 days.

## 2015-11-07 NOTE — Telephone Encounter (Signed)
Spoke with pt. Saw KW on 11/02/2015. Was given Levaquin for her symptoms. Levaquin is causing terrible abdominal pain and nausea. Wants this changed to another antibiotic. Still having issues with cough that is producing green mucus.  RB - please advise. Thanks.

## 2015-11-08 MED ORDER — DOXYCYCLINE HYCLATE 100 MG PO TABS: 100.0000 mg | ORAL_TABLET | Freq: Two times a day (BID) | ORAL | Status: DC

## 2015-11-08 NOTE — Telephone Encounter (Signed)
[phone] pt cb wanting to know why med was never called in

## 2015-11-08 NOTE — Telephone Encounter (Signed)
Called spoke with pt. She is aware of below. She will inform her doc that is prescribing her warfarin. RX has been sent in. Nothing further needed

## 2015-11-08 NOTE — Telephone Encounter (Signed)
Per RB >> This is still fine to send. Pt needs to be made aware of interaction as well as whoever prescribes her coumadin.

## 2015-11-08 NOTE — Telephone Encounter (Signed)
Called spoke with pt. Apologized she never received a call back yesterday. Advise I am paging RB and will call her back. Please advise RB on drug-drug interaction. thanks

## 2015-11-09 ENCOUNTER — Ambulatory Visit (INDEPENDENT_AMBULATORY_CARE_PROVIDER_SITE_OTHER): Payer: Medicare Other | Admitting: General Practice

## 2015-11-09 DIAGNOSIS — I2699 Other pulmonary embolism without acute cor pulmonale: Secondary | ICD-10-CM

## 2015-11-09 DIAGNOSIS — Z5181 Encounter for therapeutic drug level monitoring: Secondary | ICD-10-CM

## 2015-11-09 DIAGNOSIS — Z7901 Long term (current) use of anticoagulants: Secondary | ICD-10-CM | POA: Diagnosis not present

## 2015-11-09 LAB — POCT INR: INR: 1.2

## 2015-11-09 NOTE — Progress Notes (Signed)
I have reviewed and agree with the plan. 

## 2015-11-09 NOTE — Progress Notes (Signed)
Pre visit review using our clinic review tool, if applicable. No additional management support is needed unless otherwise documented below in the visit note. INR low today.  Patient is taking Levaquin and Doxycycline and stopped coumadin for a few days.  I boosted patient today and put her back on normal dosage but because of anticiotics I will check her INR on Monday at the [place] office.  I instructed patient not to stop coumadin unless directed to do so.  Patient verbalized understanding.

## 2015-11-14 ENCOUNTER — Ambulatory Visit (INDEPENDENT_AMBULATORY_CARE_PROVIDER_SITE_OTHER): Payer: Medicare Other | Admitting: General Practice

## 2015-11-14 DIAGNOSIS — Z5181 Encounter for therapeutic drug level monitoring: Secondary | ICD-10-CM | POA: Diagnosis not present

## 2015-11-14 DIAGNOSIS — Z7901 Long term (current) use of anticoagulants: Secondary | ICD-10-CM | POA: Diagnosis not present

## 2015-11-14 DIAGNOSIS — I2699 Other pulmonary embolism without acute cor pulmonale: Secondary | ICD-10-CM

## 2015-11-14 LAB — POCT INR: INR: 1.7

## 2015-11-14 NOTE — Progress Notes (Signed)
Pre visit review using our clinic review tool, if applicable. No additional management support is needed unless otherwise documented below in the visit note. 

## 2015-11-14 NOTE — Progress Notes (Signed)
I agree with this plan.

## 2015-11-23 ENCOUNTER — Ambulatory Visit (INDEPENDENT_AMBULATORY_CARE_PROVIDER_SITE_OTHER): Payer: Medicare Other | Admitting: Internal Medicine

## 2015-11-23 ENCOUNTER — Encounter: Payer: Self-pay | Admitting: Internal Medicine

## 2015-11-23 VITALS — BP 140/80 | HR 80 | Temp 98.6°F | Resp 20 | Wt 150.0 lb

## 2015-11-23 DIAGNOSIS — R42 Dizziness and giddiness: Secondary | ICD-10-CM | POA: Diagnosis not present

## 2015-11-23 DIAGNOSIS — J471 Bronchiectasis with (acute) exacerbation: Secondary | ICD-10-CM

## 2015-11-23 DIAGNOSIS — K219 Gastro-esophageal reflux disease without esophagitis: Secondary | ICD-10-CM | POA: Diagnosis not present

## 2015-11-23 DIAGNOSIS — I10 Essential (primary) hypertension: Secondary | ICD-10-CM | POA: Diagnosis not present

## 2015-11-23 NOTE — Progress Notes (Signed)
Pre visit review using our clinic review tool, if applicable. No additional management support is needed unless otherwise documented below in the visit note. 

## 2015-11-23 NOTE — Patient Instructions (Addendum)
Please continue all other medications as before, and refills have been done if requested.  Please have the pharmacy call with any other refills you may need.  Please continue your efforts at being more active, low cholesterol diet, and weight control.  You are otherwise up to date with prevention measures today.  Please keep your appointments with your specialists as you may have planned  Please return in 4 months, or sooner if needed 

## 2015-11-28 NOTE — Progress Notes (Signed)
Subjective:    Patient ID: [name], female    DOB: 1930-06-27, 82 y.o.   MRN: [id number]  HPI  Here to f/u; overall doing ok,  Pt denies chest pain, increasing sob or doe, wheezing, orthopnea, PND, increased LE swelling, palpitations, or syncope but has had recurring vertigo as before.  Pt denies new neurological symptoms such as new headache, or facial or extremity weakness or numbness.  Pt denies polydipsia, polyuria,  Pt denies new neurological symptoms such as new headache, or facial or extremity weakness or numbness.   Pt states overall good compliance with meds. Does have several wks ongoing nasal allergy symptoms with clearish congestion, itch and sneezing, without fever, pain, ST, cough, swelling or wheezing.  Denies worsening reflux, abd pain, dysphagia, n/v, bowel change or blood. Past Medical History  Diagnosis Date  . HYPERLIPIDEMIA   . ANXIETY   . DEPRESSION   . COMMON MIGRAINE   . Unspecified hearing loss   . HYPERTENSION   . CORONARY ARTERY DISEASE   . PULMONARY HYPERTENSION, SECONDARY   . CAROTID ARTERY STENOSIS, RIGHT   . Chronic rhinitis   . ALLERGIC RHINITIS   . COPD   . GERD   . DIVERTICULOSIS, COLON   . CYST/PSEUDOCYST, PANCREAS   . OVERACTIVE BLADDER   . OSTEOPOROSIS   . DIZZINESS, CHRONIC   . Pulmonary embolism ([place]) 2008  . Bronchiectasis 12/11/2011    CT chest dx  . Esophageal stricture   . Complication of anesthesia     dizzy  . Stroke ([place])   . Tubular adenoma of colon 10/2013   Past Surgical History  Procedure Laterality Date  . Abdominal hysterectomy  1965  . Egd  1999  . Cholecystectomy  1963  . Tonsillectomy    . Appendectomy  1963  . S/p sinus surgury    . Pancreatic cyst drainage      x 2  . Colonoscopy N/A 10/19/2013    Procedure: COLONOSCOPY;  Surgeon: Ladene Artist, MD;  Location: [facility];  Service: Endoscopy;  Laterality: N/A;    reports that she has never smoked. She has never used smokeless tobacco. She reports that  she does not drink alcohol or use illicit drugs. family history includes Breast cancer in her daughter; Emphysema in her father; Heart disease in her mother; Lymphoma in her sister; Stroke in her father. Allergies  Allergen Reactions  . Amoxicillin-Pot Clavulanate     REACTION: diarrhea Has patient had a PCN reaction causing immediate rash, facial/tongue/throat swelling, SOB or lightheadedness with hypotension: unknown Has patient had a PCN reaction causing severe rash involving mucus membranes or skin necrosis: unknown Has patient had a PCN reaction that required hospitalization : unknown Has patient had a PCN reaction occurring within the last 10 years: yes   pt states she should be able to take penicillin, but cant remember actually taking it   . Ciclesonide     REACTION: bad smell  . Ciprofloxacin Other (See Comments)    dizziness  . Raloxifene     REACTION: risk of recurrent stroke  . Sulfonamide Derivatives     REACTION: tongue swells   Current Outpatient Prescriptions on File Prior to Visit  Medication Sig Dispense Refill  . albuterol (PROVENTIL HFA;VENTOLIN HFA) 108 (90 BASE) MCG/ACT inhaler Inhale 2 puffs into the lungs every 6 (six) hours as needed for wheezing or shortness of breath. 1 Inhaler 11  . amLODipine (NORVASC) 5 MG tablet TAKE 1 TABLET EVERY DAY 90  tablet 1  . aspirin 81 MG EC tablet Take 81 mg by mouth daily.     . calcium carbonate (TUMS) 500 MG chewable tablet Chew 1 tablet by mouth daily as needed.      . cefdinir (OMNICEF) 300 MG capsule Take 1 capsule (300 mg total) by mouth 2 (two) times daily. 20 capsule 0  . chlorpheniramine-HYDROcodone (TUSSIONEX PENNKINETIC ER) 10-8 MG/5ML LQCR Take 5 mLs by mouth every 12 (twelve) hours as needed for cough. 115 mL 0  . Digestive Enzymes (PAPAYA AND ENZYMES) CHEW Chew 2 tablets by mouth 3 (three) times daily with meals as needed (digestion of big meals).     [name] doxycycline (VIBRA-TABS) 100 MG tablet Take 1 tablet (100 mg  total) by mouth 2 (two) times daily. 10 tablet 0  . ipratropium-albuterol (DUONEB) 0.5-2.5 (3) MG/3ML SOLN Take 3 mLs by nebulization every 6 (six) hours as needed. 360 mL 11  . levofloxacin (LEVAQUIN) 500 MG tablet Take 1 tablet (500 mg total) by mouth daily. 7 tablet 0  . loratadine (CLARITIN) 10 MG tablet Take 10 mg by mouth daily as needed for allergies.     [name] losartan (COZAAR) 100 MG tablet TAKE 1 TABLET DAILY. 90 tablet 1  . meclizine (ANTIVERT) 12.5 MG tablet Take 1 tablet (12.5 mg total) by mouth 3 (three) times daily as needed for dizziness. 40 tablet 3  . mometasone (NASONEX) 50 MCG/ACT nasal spray Place 2 sprays into the nose daily as needed (allergies).     . Multiple Vitamins-Minerals (MACUVITE) TABS Take 1 tablet by mouth daily.      . pantoprazole (PROTONIX) 40 MG tablet Take 1 tablet (40 mg total) by mouth 2 (two) times daily. (Patient taking differently: Take 40 mg by mouth 2 (two) times daily as needed (heartburn/acid reflux). ) 180 tablet 3  . Probiotic Product (PROBIOTIC DAILY PO) Take 1 tablet by mouth daily.    [name] Respiratory Therapy Supplies (FLUTTER) DEVI As directed 1 each 0  . warfarin (COUMADIN) 5 MG tablet TAKE AS DIRECTED BY ANTICOAGULATION CLINIC. (Patient taking differently: take 2.5mg  by mouth once daily) 90 tablet 0   No current facility-administered medications on file prior to visit.   Review of Systems  Constitutional: Negative for unusual diaphoresis or night sweats HENT: Negative for ringing in ear or discharge Eyes: Negative for double vision or worsening visual disturbance.  Respiratory: Negative for choking and stridor.   Gastrointestinal: Negative for vomiting or other signifcant bowel change Genitourinary: Negative for hematuria or change in urine volume.  Musculoskeletal: Negative for other MSK pain or swelling Skin: Negative for color change and worsening wound.  Neurological: Negative for tremors and numbness other than noted    Psychiatric/Behavioral: Negative for decreased concentration or agitation other than above       Objective:   Physical Exam BP 140/80 mmHg  Pulse 80  Temp(Src) 98.6 F (37 C) (Oral)  Resp 20  Wt 150 lb (68.04 kg)  SpO2 96% VS noted,  Constitutional: Pt appears in no significant distress HENT: Head: NCAT.  Right Ear: External ear normal.  Left Ear: External ear normal.  Eyes: . Pupils are equal, round, and reactive to light. Conjunctivae and EOM are normal Neck: Normal range of motion. Neck supple.  Cardiovascular: Normal rate and regular rhythm.   Pulmonary/Chest: Effort normal and breath sounds decreased bilat  Abd:  Soft, NT, ND, + BS Neurological: Pt is alert. Not confused , motor grossly intact Skin: Skin is warm. No rash,  no LE edema Psychiatric: Pt behavior is normal. No agitation. 1+ nervous    Assessment & Plan:

## 2015-11-28 NOTE — Assessment & Plan Note (Signed)
Chronic persistent, exam benign, o/w stable overall by history and exam, recent data reviewed with pt, and pt to continue medical treatment as before,  to f/u any worsening symptoms or concerns Lab Results  Component Value Date   WBC 9.3 10/25/2015   HGB 14.5 10/25/2015   HCT 42.1 10/25/2015   PLT 294 10/25/2015   GLUCOSE 129* 10/25/2015   CHOL 211* 11/10/2012   TRIG 147.0 11/10/2012   HDL 58.40 11/10/2012   LDLDIRECT 122.7 11/10/2012   ALT 18 01/10/2015   AST 20 01/10/2015   NA 135 10/25/2015   K 4.3 10/25/2015   CL 100* 10/25/2015   CREATININE 0.60 10/25/2015   BUN 15 10/25/2015   CO2 25 10/25/2015   TSH 2.86 01/10/2015   INR 1.7 11/14/2015   HGBA1C 5.8 05/26/2013

## 2015-11-28 NOTE — Assessment & Plan Note (Signed)
Recently improved,  O/w stable, to f/u any worsening symptoms or concerns

## 2015-11-28 NOTE — Assessment & Plan Note (Signed)
stable overall by history and exam, recent data reviewed with pt, and pt to continue medical treatment as before,  to f/u any worsening symptoms or concerns BP Readings from Last 3 Encounters:  11/23/15 140/80  11/02/15 148/90  10/25/15 141/73

## 2015-11-28 NOTE — Assessment & Plan Note (Signed)
stable overall by history and exam, and pt to continue medical treatment as before,  to f/u any worsening symptoms or concerns 

## 2015-11-30 ENCOUNTER — Other Ambulatory Visit: Payer: Self-pay | Admitting: Internal Medicine

## 2015-12-02 ENCOUNTER — Ambulatory Visit: Payer: Medicare Other

## 2015-12-07 ENCOUNTER — Ambulatory Visit: Payer: Medicare Other

## 2015-12-13 ENCOUNTER — Ambulatory Visit: Payer: Medicare Other | Admitting: Emergency Medicine

## 2015-12-14 ENCOUNTER — Ambulatory Visit (INDEPENDENT_AMBULATORY_CARE_PROVIDER_SITE_OTHER): Payer: Medicare Other | Admitting: General Practice

## 2015-12-14 DIAGNOSIS — Z7901 Long term (current) use of anticoagulants: Secondary | ICD-10-CM

## 2015-12-14 DIAGNOSIS — Z5181 Encounter for therapeutic drug level monitoring: Secondary | ICD-10-CM

## 2015-12-14 DIAGNOSIS — I2699 Other pulmonary embolism without acute cor pulmonale: Secondary | ICD-10-CM

## 2015-12-14 LAB — POCT INR: INR: 1.6

## 2015-12-14 NOTE — Progress Notes (Signed)
Pre visit review using our clinic review tool, if applicable. No additional management support is needed unless otherwise documented below in the visit note. 

## 2015-12-14 NOTE — Progress Notes (Signed)
I agree with this plan.

## 2015-12-28 ENCOUNTER — Other Ambulatory Visit: Payer: Self-pay | Admitting: Internal Medicine

## 2016-01-04 ENCOUNTER — Ambulatory Visit: Payer: Medicare Other

## 2016-01-11 ENCOUNTER — Ambulatory Visit (INDEPENDENT_AMBULATORY_CARE_PROVIDER_SITE_OTHER): Payer: Medicare Other | Admitting: General Practice

## 2016-01-11 DIAGNOSIS — Z5181 Encounter for therapeutic drug level monitoring: Secondary | ICD-10-CM | POA: Diagnosis not present

## 2016-01-11 LAB — POCT INR: INR: 2.2

## 2016-01-11 NOTE — Progress Notes (Signed)
I agree with this plan.

## 2016-01-11 NOTE — Progress Notes (Signed)
Pre visit review using our clinic review tool, if applicable. No additional management support is needed unless otherwise documented below in the visit note. 

## 2016-01-17 ENCOUNTER — Other Ambulatory Visit: Payer: Self-pay

## 2016-01-17 DIAGNOSIS — Z1231 Encounter for screening mammogram for malignant neoplasm of breast: Secondary | ICD-10-CM

## 2016-01-25 DIAGNOSIS — Z1231 Encounter for screening mammogram for malignant neoplasm of breast: Secondary | ICD-10-CM | POA: Diagnosis not present

## 2016-01-25 DIAGNOSIS — Z803 Family history of malignant neoplasm of breast: Secondary | ICD-10-CM | POA: Diagnosis not present

## 2016-01-26 ENCOUNTER — Telehealth: Payer: Self-pay | Admitting: Gastroenterology

## 2016-01-26 DIAGNOSIS — M810 Age-related osteoporosis without current pathological fracture: Secondary | ICD-10-CM | POA: Diagnosis not present

## 2016-01-26 DIAGNOSIS — S32050D Wedge compression fracture of fifth lumbar vertebra, subsequent encounter for fracture with routine healing: Secondary | ICD-10-CM | POA: Diagnosis not present

## 2016-01-26 NOTE — Telephone Encounter (Signed)
Agree with initial mgmt plan. Colonoscopy in 2015 showed internal hemorrhoids.

## 2016-01-26 NOTE — Telephone Encounter (Signed)
Patient reports constipation with bright red rectal bleeding.  She has a history of bleeding hemorrhoids in the past.  She will start Miralax 1-3 times a day then titrate for effect.  She will increase the amount of fluid in her diet and continue her daily fiber supplements and pro-biotics.  She will call back if she has continued symptoms.

## 2016-01-27 ENCOUNTER — Ambulatory Visit: Payer: Medicare Other | Admitting: Emergency Medicine

## 2016-02-08 ENCOUNTER — Ambulatory Visit (INDEPENDENT_AMBULATORY_CARE_PROVIDER_SITE_OTHER): Payer: Medicare Other | Admitting: General Practice

## 2016-02-08 DIAGNOSIS — Z5181 Encounter for therapeutic drug level monitoring: Secondary | ICD-10-CM

## 2016-02-08 LAB — POCT INR: INR: 1.5

## 2016-02-08 NOTE — Progress Notes (Signed)
I have reviewed and agree with the plan. 

## 2016-02-08 NOTE — Progress Notes (Signed)
Pre visit review using our clinic review tool, if applicable. No additional management support is needed unless otherwise documented below in the visit note. 

## 2016-02-13 DIAGNOSIS — Z961 Presence of intraocular lens: Secondary | ICD-10-CM | POA: Diagnosis not present

## 2016-02-13 DIAGNOSIS — H35363 Drusen (degenerative) of macula, bilateral: Secondary | ICD-10-CM | POA: Diagnosis not present

## 2016-02-13 DIAGNOSIS — H35032 Hypertensive retinopathy, left eye: Secondary | ICD-10-CM | POA: Diagnosis not present

## 2016-02-13 DIAGNOSIS — H04123 Dry eye syndrome of bilateral lacrimal glands: Secondary | ICD-10-CM | POA: Diagnosis not present

## 2016-02-13 DIAGNOSIS — H43393 Other vitreous opacities, bilateral: Secondary | ICD-10-CM | POA: Diagnosis not present

## 2016-02-13 DIAGNOSIS — H35031 Hypertensive retinopathy, right eye: Secondary | ICD-10-CM | POA: Diagnosis not present

## 2016-02-13 DIAGNOSIS — H1013 Acute atopic conjunctivitis, bilateral: Secondary | ICD-10-CM | POA: Diagnosis not present

## 2016-02-16 ENCOUNTER — Encounter: Payer: Self-pay | Admitting: Internal Medicine

## 2016-02-29 ENCOUNTER — Other Ambulatory Visit: Payer: Self-pay | Admitting: Internal Medicine

## 2016-02-29 ENCOUNTER — Ambulatory Visit (INDEPENDENT_AMBULATORY_CARE_PROVIDER_SITE_OTHER): Payer: Medicare Other | Admitting: General Practice

## 2016-02-29 DIAGNOSIS — I2699 Other pulmonary embolism without acute cor pulmonale: Secondary | ICD-10-CM

## 2016-02-29 DIAGNOSIS — Z7901 Long term (current) use of anticoagulants: Secondary | ICD-10-CM

## 2016-02-29 DIAGNOSIS — Z5181 Encounter for therapeutic drug level monitoring: Secondary | ICD-10-CM | POA: Diagnosis not present

## 2016-02-29 LAB — POCT INR: INR: 2.8

## 2016-02-29 NOTE — Progress Notes (Signed)
I agree with this plan.

## 2016-02-29 NOTE — Progress Notes (Signed)
Pre visit review using our clinic review tool, if applicable. No additional management support is needed unless otherwise documented below in the visit note. 

## 2016-02-29 NOTE — Telephone Encounter (Signed)
Please advise 

## 2016-03-26 ENCOUNTER — Ambulatory Visit: Payer: Medicare Other

## 2016-03-28 ENCOUNTER — Ambulatory Visit (INDEPENDENT_AMBULATORY_CARE_PROVIDER_SITE_OTHER): Payer: Medicare Other | Admitting: General Practice

## 2016-03-28 DIAGNOSIS — Z5181 Encounter for therapeutic drug level monitoring: Secondary | ICD-10-CM

## 2016-03-28 LAB — POCT INR: INR: 4

## 2016-03-28 NOTE — Progress Notes (Signed)
Pre visit review using our clinic review tool, if applicable. No additional management support is needed unless otherwise documented below in the visit note. 

## 2016-03-29 NOTE — Progress Notes (Signed)
I agree with this plan.

## 2016-04-02 DIAGNOSIS — S8012XA Contusion of left lower leg, initial encounter: Secondary | ICD-10-CM | POA: Diagnosis not present

## 2016-04-02 DIAGNOSIS — Z0389 Encounter for observation for other suspected diseases and conditions ruled out: Secondary | ICD-10-CM | POA: Diagnosis not present

## 2016-04-02 DIAGNOSIS — S8992XA Unspecified injury of left lower leg, initial encounter: Secondary | ICD-10-CM | POA: Diagnosis not present

## 2016-04-02 DIAGNOSIS — M79669 Pain in unspecified lower leg: Secondary | ICD-10-CM | POA: Diagnosis not present

## 2016-04-02 DIAGNOSIS — Z7901 Long term (current) use of anticoagulants: Secondary | ICD-10-CM | POA: Diagnosis not present

## 2016-04-02 DIAGNOSIS — Z79899 Other long term (current) drug therapy: Secondary | ICD-10-CM | POA: Diagnosis not present

## 2016-04-02 DIAGNOSIS — S8990XA Unspecified injury of unspecified lower leg, initial encounter: Secondary | ICD-10-CM | POA: Diagnosis not present

## 2016-04-03 ENCOUNTER — Ambulatory Visit: Payer: Medicare Other | Admitting: Emergency Medicine

## 2016-04-05 ENCOUNTER — Ambulatory Visit: Payer: Medicare Other | Admitting: Emergency Medicine

## 2016-04-18 ENCOUNTER — Ambulatory Visit (INDEPENDENT_AMBULATORY_CARE_PROVIDER_SITE_OTHER): Payer: Medicare Other | Admitting: General Practice

## 2016-04-18 ENCOUNTER — Ambulatory Visit: Payer: Medicare Other

## 2016-04-18 DIAGNOSIS — Z5181 Encounter for therapeutic drug level monitoring: Secondary | ICD-10-CM | POA: Diagnosis not present

## 2016-04-18 DIAGNOSIS — M461 Sacroiliitis, not elsewhere classified: Secondary | ICD-10-CM | POA: Diagnosis not present

## 2016-04-18 DIAGNOSIS — M47816 Spondylosis without myelopathy or radiculopathy, lumbar region: Secondary | ICD-10-CM | POA: Diagnosis not present

## 2016-04-18 LAB — POCT INR: INR: 1.8

## 2016-04-18 NOTE — Progress Notes (Signed)
I agree with this plan.

## 2016-04-18 NOTE — Progress Notes (Signed)
Pre visit review using our clinic review tool, if applicable. No additional management support is needed unless otherwise documented below in the visit note. 

## 2016-04-25 DIAGNOSIS — R5383 Other fatigue: Secondary | ICD-10-CM | POA: Diagnosis not present

## 2016-04-25 DIAGNOSIS — M461 Sacroiliitis, not elsewhere classified: Secondary | ICD-10-CM | POA: Diagnosis not present

## 2016-04-25 DIAGNOSIS — E559 Vitamin D deficiency, unspecified: Secondary | ICD-10-CM | POA: Diagnosis not present

## 2016-04-25 DIAGNOSIS — M810 Age-related osteoporosis without current pathological fracture: Secondary | ICD-10-CM | POA: Diagnosis not present

## 2016-05-11 ENCOUNTER — Ambulatory Visit: Payer: Medicare Other | Admitting: Internal Medicine

## 2016-05-15 ENCOUNTER — Other Ambulatory Visit: Payer: Medicare Other

## 2016-05-15 ENCOUNTER — Ambulatory Visit (INDEPENDENT_AMBULATORY_CARE_PROVIDER_SITE_OTHER): Payer: Medicare Other | Admitting: Emergency Medicine

## 2016-05-15 ENCOUNTER — Encounter: Payer: Self-pay | Admitting: Emergency Medicine

## 2016-05-15 VITALS — BP 142/82 | HR 89 | Ht 65.0 in | Wt 149.0 lb

## 2016-05-15 DIAGNOSIS — J449 Chronic obstructive pulmonary disease, unspecified: Secondary | ICD-10-CM | POA: Diagnosis not present

## 2016-05-15 DIAGNOSIS — J479 Bronchiectasis, uncomplicated: Secondary | ICD-10-CM

## 2016-05-15 DIAGNOSIS — J309 Allergic rhinitis, unspecified: Secondary | ICD-10-CM | POA: Diagnosis not present

## 2016-05-15 MED ORDER — BUDESONIDE-FORMOTEROL FUMARATE 160-4.5 MCG/ACT IN AERO: 2.0000 | INHALATION_SPRAY | Freq: Two times a day (BID) | RESPIRATORY_TRACT | 0 refills | Status: DC

## 2016-05-15 NOTE — Assessment & Plan Note (Signed)
Add Symbicort to her albuterol as below

## 2016-05-15 NOTE — Assessment & Plan Note (Signed)
With frequent exacerbations. She does have moderately severe obstruction on pulmonary function testing and is not on any maintenance bronchodilator therapy. She uses albuterol occasionally. Her CT scan of the chest from January was suggestive of possible mycobacterial disease. I would like to obtain a sputum culture for AFB and bacterial pathogens. Try starting Symbicort 2 puffs twice a day to see if she benefits. Continue albuterol as needed. She is only using flutter valve occasionally have asked her to start using it every day.

## 2016-05-15 NOTE — Progress Notes (Signed)
Subjective:    Patient ID: [name], female    DOB: 24-May-1930   MRN: [id number]  HPI 22  yowf never smoker, allergic rhinitis, hx bilat PE in September 2008, with asociated Pulm HTN (PASP 70 with intact LV fxn). Moderately Severe obstruction on pulmonary function testing from 2009 With hypoxemia . Also with a history of GERD and allergic rhinitis That contribute chronic cough.  She has bronchiectasis noted On CT scan of the chest with some associated micronodular disease particularly in the right middle lobe and lower lobes bilaterally. She experiences frequent episodes of bronchitis and acute exacerbations of her bronchiectasis that require antibiotic treatment. Her last appears to have been in January 2017 when she was seen in this office.    She is using rhinocort, taking loratadine every qhs. She has albuterol that she uses prn, no other BD's at this time. She uses a flutter valve every other day. She is using o2 at 1l/min with housework, chores. Using mucinex as needed. She feels that she is doing fairly well.  She has some L ear fullness and pressure. She has throat clearing. She has every day cough - yellow to green .          Objective:   Physical Exam Vitals:   05/15/16 1042  BP: (!) 142/82  BP Location: Left Arm  Cuff Size: Normal  Pulse: 89  SpO2: 92%  Weight: 149 lb (67.6 kg)  Height: 5\' 5"  (1.651 m)   Gen: Pleasant, well-nourished, in no distress,  normal affect  ENT: No lesions,  mouth clear,  oropharynx clear, no postnasal drip  Neck: No JVD, no TMG, no carotid bruits  Lungs: No use of accessory muscles, Focal expiratory wheezing in the right midlung zone. Left is clear  Cardiovascular: RRR, heart sounds normal, no murmur or gallops, no peripheral edema  Musculoskeletal: No deformities, no cyanosis or clubbing  Neuro: alert, non focal  Skin: Warm, no lesions or rashes    10/25/15 --  COMPARISON:  December 11, 2011 CT scan  FINDINGS: The trachea and  mainstem bronchi are normal and unchanged. Bronchiectasis is identified in the right middle and lower lobes. Multiple nodules are seen as well, particularly in the right middle lobe, lingula, and right lower lobe. Tree in bud opacities are identified as well. There are opacified airway is in the right middle lobe and right lower lobe, most likely due to inspissated mucus secondary to bronchiectasis. These findings have increased in the interval. There is also opacity in the right middle lobe medially consistent with atelectasis. No suspicious masses or acute infiltrates. Mixed attenuation of the lungs with alternating areas of increased and decreased attenuation could represent air trapping, especially in the light of obvious airways disease. The patient however does have a history of previous pulmonary emboli which could lead to a similar appearance.  Atherosclerotic changes seen in the thoracic aorta. No aneurysm or dissection. There is a small hiatal hernia. Coronary artery calcifications. The heart is unchanged. A linear density projected in a right pulmonary artery near the hilum is consistent with sequela of previous pulmonary emboli. No acute emboli identified on today's study. No acute pulmonary emboli identified on today's study.  Evaluation of the upper abdomen is limited but unremarkable.  No acute bony abnormalities.  Review of the MIP images confirms the above findings.  IMPRESSION: 1. No acute pulmonary emboli. 2. The nodularity and bronchiectasis in the lungs is consistent with chronic MAI infection.  Assessment & Plan:  Bronchiectasis With frequent exacerbations. She does have moderately severe obstruction on pulmonary function testing and is not on any maintenance bronchodilator therapy. She uses albuterol occasionally. Her CT scan of the chest from January was suggestive of possible mycobacterial disease. I would like to obtain a sputum culture for  AFB and bacterial pathogens. Try starting Symbicort 2 puffs twice a day to see if she benefits. Continue albuterol as needed. She is only using flutter valve occasionally have asked her to start using it every day.  COPD (chronic obstructive pulmonary disease) ([facility]) Add Symbicort to her albuterol as below  Allergic rhinitis Continue Rhinocort and loratadine. 6 as needed  PULMONARY HYPERTENSION, SECONDARY Continue oxygen at 1 L/m.  Baltazar Apo, MD, PhD 05/15/2016, 11:06 AM [facility] [phone] or if no answer [phone]

## 2016-05-15 NOTE — Assessment & Plan Note (Signed)
Continue Rhinocort and loratadine. 6 as needed

## 2016-05-15 NOTE — Assessment & Plan Note (Signed)
Continue oxygen at 1 L/m.

## 2016-05-15 NOTE — Patient Instructions (Signed)
We will try to collect a sputum sample to sent to the lab for culture.  We will try starting Symbicort 2 puffs twice a day to see if you benefit Take albuterol 2 puffs up to every 4 hours if needed for shortness of breath.  Continue your Rhinocort and loratadine Use mucunex as needed Try using your flutter valve once a day every day to see if this helps you Follow with Dr [name] next available after 1 month to see how you have respond\ed to the inhaled medication.

## 2016-05-16 ENCOUNTER — Ambulatory Visit (INDEPENDENT_AMBULATORY_CARE_PROVIDER_SITE_OTHER): Payer: Medicare Other | Admitting: General Practice

## 2016-05-16 DIAGNOSIS — Z7901 Long term (current) use of anticoagulants: Secondary | ICD-10-CM | POA: Diagnosis not present

## 2016-05-16 DIAGNOSIS — Z5181 Encounter for therapeutic drug level monitoring: Secondary | ICD-10-CM

## 2016-05-16 DIAGNOSIS — I2699 Other pulmonary embolism without acute cor pulmonale: Secondary | ICD-10-CM

## 2016-05-16 LAB — POCT INR: INR: 2.2

## 2016-05-16 NOTE — Progress Notes (Signed)
I agree with this plan.

## 2016-05-17 ENCOUNTER — Other Ambulatory Visit: Payer: Medicare Other

## 2016-05-17 DIAGNOSIS — J479 Bronchiectasis, uncomplicated: Secondary | ICD-10-CM

## 2016-05-21 LAB — RESPIRATORY CULTURE OR RESPIRATORY AND SPUTUM CULTURE: GRAM STAIN: NONE SEEN

## 2016-05-22 NOTE — Progress Notes (Signed)
I agree with this plan.

## 2016-05-23 DIAGNOSIS — H9202 Otalgia, left ear: Secondary | ICD-10-CM | POA: Diagnosis not present

## 2016-05-23 DIAGNOSIS — M26622 Arthralgia of left temporomandibular joint: Secondary | ICD-10-CM | POA: Diagnosis not present

## 2016-06-13 ENCOUNTER — Ambulatory Visit (INDEPENDENT_AMBULATORY_CARE_PROVIDER_SITE_OTHER): Payer: Medicare Other | Admitting: General Practice

## 2016-06-13 ENCOUNTER — Ambulatory Visit (INDEPENDENT_AMBULATORY_CARE_PROVIDER_SITE_OTHER): Payer: Medicare Other | Admitting: Emergency Medicine

## 2016-06-13 ENCOUNTER — Encounter: Payer: Self-pay | Admitting: Emergency Medicine

## 2016-06-13 DIAGNOSIS — Z5181 Encounter for therapeutic drug level monitoring: Secondary | ICD-10-CM | POA: Diagnosis not present

## 2016-06-13 DIAGNOSIS — J479 Bronchiectasis, uncomplicated: Secondary | ICD-10-CM | POA: Diagnosis not present

## 2016-06-13 DIAGNOSIS — J309 Allergic rhinitis, unspecified: Secondary | ICD-10-CM | POA: Diagnosis not present

## 2016-06-13 DIAGNOSIS — J449 Chronic obstructive pulmonary disease, unspecified: Secondary | ICD-10-CM | POA: Diagnosis not present

## 2016-06-13 DIAGNOSIS — Z7901 Long term (current) use of anticoagulants: Secondary | ICD-10-CM

## 2016-06-13 DIAGNOSIS — I2699 Other pulmonary embolism without acute cor pulmonale: Secondary | ICD-10-CM

## 2016-06-13 LAB — POCT INR: INR: 3.8

## 2016-06-13 NOTE — Assessment & Plan Note (Signed)
Continue Rhinocort and loratadine

## 2016-06-13 NOTE — Assessment & Plan Note (Signed)
Follow clinically and decide about any repeat CT scan of the chest based on her clinical status. Suspect that she is colonized with Mycobacterium avium. We will defer bronchoscopy or any other sampling at this time.

## 2016-06-13 NOTE — Assessment & Plan Note (Signed)
In the setting of bronchiectasis and chronic inflammation. We tried Symbicort but she did not tolerate. We will continue albuterol nebulizers when necessary

## 2016-06-13 NOTE — Patient Instructions (Addendum)
Flu shot this fall.  Keep albuterol nebulizer available to use as needed.  Continue your rhinocort and loratadine Follow with Dr [name] in 3 months or sooner if you have any problems. We will discuss the timing of any repeat Ct scan of your chest at a future visit

## 2016-06-13 NOTE — Progress Notes (Signed)
Subjective:    Patient ID: [name], female    DOB: 10-Dec-1929   MRN: [id number]  HPI 38  yowf never smoker, allergic rhinitis, hx bilat PE in September 2008, with asociated Pulm HTN (PASP 70 with intact LV fxn). Moderately Severe obstruction on pulmonary function testing from 2009 With hypoxemia . Also with a history of GERD and allergic rhinitis That contribute chronic cough.  She has bronchiectasis noted On CT scan of the chest with some associated micronodular disease particularly in the right middle lobe and lower lobes bilaterally. She experiences frequent episodes of bronchitis and acute exacerbations of her bronchiectasis that require antibiotic treatment. Her last appears to have been in January 2017 when she was seen in this office.    She is using rhinocort, taking loratadine every qhs. She has albuterol that she uses prn, no other BD's at this time. She uses a flutter valve every other day. She is using o2 at 1l/min with housework, chores. Using mucinex as needed. She feels that she is doing fairly well.  She has some L ear fullness and pressure. She has throat clearing. She has every day cough - yellow to green .   ROV 06/13/16 -- Patient follows up today for history of Bronchiectasis with associated moderately severe obstructive lung disease, question mycobacterial colonization. Also with allergic rhinitis currently managed on Rhinocort and loratadine. At our last visit we tried starting Symbicort twice a day to see if she would benefit. She seemed to get a lot of rhinitis, cough. Also had some anxiousness and heart racing. She stopped it after 2 days. She is doing well currently. She has nebulized albuterol available to use prn, not needing currently. She uses O2 prn. Reliable with rhinocort and loratadine.         Objective:   Physical Exam Vitals:   06/13/16 1400  BP: 132/70  BP Location: Left Arm  Cuff Size: Normal  Pulse: 87  SpO2: 90%  Weight: 150 lb (68 kg)  Height:  5\' 5"  (1.651 m)   Gen: Pleasant, well-nourished, in no distress,  normal affect  ENT: No lesions,  mouth clear,  oropharynx clear, no postnasal drip  Neck: No JVD, no TMG, no carotid bruits  Lungs: No use of accessory muscles, Focal expiratory wheezing in the right midlung zone. Left is clear  Cardiovascular: RRR, heart sounds normal, no murmur or gallops, no peripheral edema  Musculoskeletal: No deformities, no cyanosis or clubbing  Neuro: alert, non focal  Skin: Warm, no lesions or rashes    10/25/15 --  COMPARISON:  December 11, 2011 CT scan  FINDINGS: The trachea and mainstem bronchi are normal and unchanged. Bronchiectasis is identified in the right middle and lower lobes. Multiple nodules are seen as well, particularly in the right middle lobe, lingula, and right lower lobe. Tree in bud opacities are identified as well. There are opacified airway is in the right middle lobe and right lower lobe, most likely due to inspissated mucus secondary to bronchiectasis. These findings have increased in the interval. There is also opacity in the right middle lobe medially consistent with atelectasis. No suspicious masses or acute infiltrates. Mixed attenuation of the lungs with alternating areas of increased and decreased attenuation could represent air trapping, especially in the light of obvious airways disease. The patient however does have a history of previous pulmonary emboli which could lead to a similar appearance.  Atherosclerotic changes seen in the thoracic aorta. No aneurysm or dissection. There is a small  hiatal hernia. Coronary artery calcifications. The heart is unchanged. A linear density projected in a right pulmonary artery near the hilum is consistent with sequela of previous pulmonary emboli. No acute emboli identified on today's study. No acute pulmonary emboli identified on today's study.  Evaluation of the upper abdomen is limited but  unremarkable.  No acute bony abnormalities.  Review of the MIP images confirms the above findings.  IMPRESSION: 1. No acute pulmonary emboli. 2. The nodularity and bronchiectasis in the lungs is consistent with chronic MAI infection.       Assessment & Plan:  COPD (chronic obstructive pulmonary disease) ([place]) In the setting of bronchiectasis and chronic inflammation. We tried Symbicort but she did not tolerate. We will continue albuterol nebulizers when necessary  Allergic rhinitis Continue Rhinocort and loratadine  Bronchiectasis Follow clinically and decide about any repeat CT scan of the chest based on her clinical status. Suspect that she is colonized with Mycobacterium avium. We will defer bronchoscopy or any other sampling at this time.  Baltazar Apo, MD, PhD 06/13/2016, 2:26 PM [facility] [phone] or if no answer [phone]

## 2016-06-27 ENCOUNTER — Telehealth: Payer: Self-pay | Admitting: Internal Medicine

## 2016-06-27 ENCOUNTER — Encounter: Payer: Self-pay | Admitting: Adult Health

## 2016-06-27 ENCOUNTER — Ambulatory Visit (INDEPENDENT_AMBULATORY_CARE_PROVIDER_SITE_OTHER): Payer: Medicare Other | Admitting: Adult Health

## 2016-06-27 VITALS — Temp 97.9°F | Ht 65.0 in | Wt 150.0 lb

## 2016-06-27 DIAGNOSIS — R42 Dizziness and giddiness: Secondary | ICD-10-CM | POA: Diagnosis not present

## 2016-06-27 DIAGNOSIS — J0140 Acute pansinusitis, unspecified: Secondary | ICD-10-CM

## 2016-06-27 MED ORDER — DOXYCYCLINE HYCLATE 100 MG PO CAPS: 100.0000 mg | ORAL_CAPSULE | Freq: Two times a day (BID) | ORAL | 0 refills | Status: DC

## 2016-06-27 NOTE — Patient Instructions (Signed)
It was great meeting you today!  Your exam is consistent with a sinus infection and along with some slight dehdration is causing your symptoms.   I have sent in a prescription for Doxycycline, take this antibiotic twice a day for 7 days. You can also use Afrin nasal spray but not for longer than 2 days.   Drink extra fluids and rest  Follow up with your PCP if no improvement   General Recommendations:    Please drink plenty of fluids.  Get plenty of rest   Sleep in humidified air  Use saline nasal sprays  Netti pot   OTC Medications:  Decongestants - helps relieve congestion   Flonase (generic fluticasone) or Nasacort (generic triamcinolone) - please make sure to use the "cross-over" technique at a 45 degree angle towards the opposite eye as opposed to straight up the nasal passageway.   Sudafed (generic pseudoephedrine - Note this is the one that is available behind the pharmacy counter); Products with phenylephrine (-PE) may also be used but is often not as effective as pseudoephedrine.   If you have HIGH BLOOD PRESSURE - Coricidin HBP; AVOID any product that is -D as this contains pseudoephedrine which may increase your blood pressure.  Afrin (oxymetazoline) every 6-8 hours for up to 3 days.   Allergies - helps relieve runny nose, itchy eyes and sneezing   Claritin (generic loratidine), Allegra (fexofenidine), or Zyrtec (generic cyrterizine) for runny nose. These medications should not cause drowsiness.  Note - Benadryl (generic diphenhydramine) may be used however may cause drowsiness  Cough -   Delsym or Robitussin (generic dextromethorphan)  Expectorants - helps loosen mucus to ease removal   Mucinex (generic guaifenesin) as directed on the package.  Headaches / General Aches   Tylenol (generic acetaminophen) - DO NOT EXCEED 3 grams (3,000 mg) in a 24 hour time period  Advil/Motrin (generic ibuprofen)   Sore Throat -   Salt water gargle    Chloraseptic (generic benzocaine) spray or lozenges / Sucrets (generic dyclonine)    Sinusitis Sinusitis is redness, soreness, and inflammation of the paranasal sinuses. Paranasal sinuses are air pockets within the bones of your face (beneath the eyes, the middle of the forehead, or above the eyes). In healthy paranasal sinuses, mucus is able to drain out, and air is able to circulate through them by way of your nose. However, when your paranasal sinuses are inflamed, mucus and air can become trapped. This can allow bacteria and other germs to grow and cause infection. Sinusitis can develop quickly and last only a short time (acute) or continue over a long period (chronic). Sinusitis that lasts for more than 12 weeks is considered chronic.  CAUSES  Causes of sinusitis include:  Allergies.  Structural abnormalities, such as displacement of the cartilage that separates your nostrils (deviated septum), which can decrease the air flow through your nose and sinuses and affect sinus drainage.  Functional abnormalities, such as when the small hairs (cilia) that line your sinuses and help remove mucus do not work properly or are not present. SIGNS AND SYMPTOMS  Symptoms of acute and chronic sinusitis are the same. The primary symptoms are pain and pressure around the affected sinuses. Other symptoms include:  Upper toothache.  Earache.  Headache.  Bad breath.  Decreased sense of smell and taste.  A cough, which worsens when you are lying flat.  Fatigue.  Fever.  Thick drainage from your nose, which often is green and may contain pus (purulent).  Swelling and warmth over the affected sinuses. DIAGNOSIS  Your health care provider will perform a physical exam. During the exam, your health care provider may:  Look in your nose for signs of abnormal growths in your nostrils (nasal polyps).  Tap over the affected sinus to check for signs of infection.  View the inside of your sinuses  (endoscopy) using an imaging device that has a light attached (endoscope). If your health care provider suspects that you have chronic sinusitis, one or more of the following tests may be recommended:  Allergy tests.  Nasal culture. A sample of mucus is taken from your nose, sent to a lab, and screened for bacteria.  Nasal cytology. A sample of mucus is taken from your nose and examined by your health care provider to determine if your sinusitis is related to an allergy. TREATMENT  Most cases of acute sinusitis are related to a viral infection and will resolve on their own within 10 days. Sometimes medicines are prescribed to help relieve symptoms (pain medicine, decongestants, nasal steroid sprays, or saline sprays).  However, for sinusitis related to a bacterial infection, your health care provider will prescribe antibiotic medicines. These are medicines that will help kill the bacteria causing the infection.  Rarely, sinusitis is caused by a fungal infection. In theses cases, your health care provider will prescribe antifungal medicine. For some cases of chronic sinusitis, surgery is needed. Generally, these are cases in which sinusitis recurs more than 3 times per year, despite other treatments. HOME CARE INSTRUCTIONS   Drink plenty of water. Water helps thin the mucus so your sinuses can drain more easily.  Use a humidifier.  Inhale steam 3 to 4 times a day (for example, sit in the bathroom with the shower running).  Apply a warm, moist washcloth to your face 3 to 4 times a day, or as directed by your health care provider.  Use saline nasal sprays to help moisten and clean your sinuses.  Take medicines only as directed by your health care provider.  If you were prescribed either an antibiotic or antifungal medicine, finish it all even if you start to feel better. SEEK IMMEDIATE MEDICAL CARE IF:  You have increasing pain or severe headaches.  You have nausea, vomiting, or  drowsiness.  You have swelling around your face.  You have vision problems.  You have a stiff neck.  You have difficulty breathing. MAKE SURE YOU:   Understand these instructions.  Will watch your condition.  Will get help right away if you are not doing well or get worse. Document Released: 09/24/2005 Document Revised: 02/08/2014 Document Reviewed: 10/09/2011 [facility] Patient Information 2015 [place], [place]. This information is not intended to replace advice given to you by your health care provider. Make sure you discuss any questions you have with your health care provider.

## 2016-06-27 NOTE — Progress Notes (Signed)
Pre visit review using our clinic review tool, if applicable. No additional management support is needed unless otherwise documented below in the visit note. 

## 2016-06-27 NOTE — Progress Notes (Signed)
Subjective:    Patient ID: [name], female    DOB: 1930-08-01, 82 y.o.   MRN: [id number]  HPI  82 year old female who  has a past medical history of ALLERGIC RHINITIS; ANXIETY; Bronchiectasis (12/11/2011); CAROTID ARTERY STENOSIS, RIGHT; Chronic rhinitis; COMMON MIGRAINE; Complication of anesthesia; COPD; CORONARY ARTERY DISEASE; CYST/PSEUDOCYST, PANCREAS; DEPRESSION; DIVERTICULOSIS, COLON; DIZZINESS, CHRONIC; Esophageal stricture; GERD; HYPERLIPIDEMIA; HYPERTENSION; OSTEOPOROSIS; OVERACTIVE BLADDER; Pulmonary embolism ([place]) (2008); PULMONARY HYPERTENSION, SECONDARY; Stroke [facility]); Tubular adenoma of colon (10/2013); and Unspecified hearing loss. She presents to the office today for dizziness that has been present for two weeks. She reports that when she wakes up in the morning and starts moving around she becomes dizzy and as the day goes on she continues to stay dizzy.   She also feels as though she has " congestion in my face and I have a band of pressure that runs across my eyes." She has had this feeling of congestion for about a week. She endorses a semi productive cough. She denies any worsening SOB, fevers or feeling acutely ill.   She feels as though the room is not spinning and this does not feel like vertigo   She has been using Claritin, Rhinocort and Mucinex which helps for a few hours      Review of Systems  Constitutional: Positive for activity change, appetite change and fatigue. Negative for chills and fever.  HENT: Positive for congestion, ear pain, postnasal drip, sinus pressure and tinnitus. Negative for rhinorrhea.   Eyes: Negative.   Respiratory: Positive for shortness of breath (chronic ).   Cardiovascular: Negative.   Musculoskeletal: Negative.   Neurological: Positive for dizziness. Negative for tremors, light-headedness, numbness and headaches.  Psychiatric/Behavioral: Negative.   All other systems reviewed and are negative.      Objective:   Physical  Exam  Constitutional: She is oriented to person, place, and time. She appears well-developed and well-nourished. No distress.  HENT:  Head: Normocephalic and atraumatic.  Right Ear: Hearing, external ear and ear canal normal. Tympanic membrane is bulging. Tympanic membrane is not erythematous.  Left Ear: Hearing, external ear and ear canal normal. Tympanic membrane is bulging. Tympanic membrane is not erythematous.  Nose: Mucosal edema present. No rhinorrhea. Right sinus exhibits maxillary sinus tenderness and frontal sinus tenderness. Left sinus exhibits maxillary sinus tenderness and frontal sinus tenderness.  Mouth/Throat: Oropharynx is clear and moist. No oropharyngeal exudate.  Eyes: Conjunctivae and EOM are normal. Pupils are equal, round, and reactive to light. Right eye exhibits no discharge. Left eye exhibits no discharge. No scleral icterus.  Neck: Normal range of motion. Neck supple. No thyromegaly present.  Cardiovascular: Normal rate, regular rhythm, normal heart sounds and intact distal pulses.  Exam reveals no gallop and no friction rub.   No murmur heard. Pulmonary/Chest: Effort normal and breath sounds normal. No respiratory distress. She has no wheezes. She has no rales. She exhibits no tenderness.  Musculoskeletal: Normal range of motion. She exhibits no edema, tenderness or deformity.  Lymphadenopathy:    She has no cervical adenopathy.  Neurological: She is alert and oriented to person, place, and time.  Skin: Skin is warm and dry. No rash noted. She is not diaphoretic. No erythema. No pallor.  Psychiatric: She has a normal mood and affect. Her behavior is normal. Judgment and thought content normal.  Nursing note and vitals reviewed.     Assessment & Plan:  1. Acute pansinusitis, recurrence not specified - doxycycline (VIBRAMYCIN) 100 MG  capsule; Take 1 capsule (100 mg total) by mouth 2 (two) times daily.  Dispense: 14 capsule; Refill: 0 - Increase fluids and rest -  Can use Afrin for the next two days  - Follow up if no improvement   2. Dizziness Orthostatic VS for the past 24 hrs (Last 3 readings):  BP- Lying Pulse- Lying BP- Sitting Pulse- Sitting BP- Standing at 0 minutes Pulse- Standing at 0 minutes  06/27/16 1426 154/84 76 158/74 78 122/72 84  - Likely related to sinusitis and dehydration .  - Advised to increase fluids  - Follow up if no improvement  Dorothyann Peng, NP

## 2016-06-27 NOTE — Telephone Encounter (Signed)
Patient Name: DAREN WEIHE DOB: [birth year] Initial Comment Very dizzy and nauseated Nurse Assessment Nurse: Julien Girt, RN, Almyra Free Date/Time Eilene Ghazi Time): 06/27/2016 11:02:15 AM Confirm and document reason for call. If symptomatic, describe symptoms. You must click the next button to save text entered. ---Caller states she has been feeling bad for over a week. She had had cold sx with dizziness and nausea that comes and goes. Denies vertigo. States the cold sx are getting better. Has the patient traveled out of the country within the last 30 days? ---Not Applicable Does the patient have any new or worsening symptoms? ---Yes Will a triage be completed? ---Yes Related visit to physician within the last 2 weeks? ---No Does the PT have any chronic conditions? (i.e. diabetes, asthma, etc.) ---Yes List chronic conditions. ---Htn Is this a behavioral health or substance abuse call? ---No Nurse: Christel Mormon, RN, Levada Dy Date/Time (Eastern Time): 06/27/2016 11:24:33 AM Confirm and document reason for call. If symptomatic, describe symptoms. You must click the next button to save text entered. ---Caller has had nausea and dizziness for about a week. She starts it's worse in the am. She said she has an appointment for 2:15pm. Has the patient traveled out of the country within the last 30 days? ---No Does the patient have any new or worsening symptoms? ---Yes Will a triage be completed? ---Yes Related visit to physician within the last 2 weeks? ---Yes Does the PT have any chronic conditions? (i.e. diabetes, asthma, etc.) ---Yes List chronic conditions. ---HTN, past hx of a blood clot Is this a behavioral health or substance abuse call? ---No Guidelines Guideline Title Affirmed Question Affirmed Notes Dizziness - Lightheadedness [1] MODERATE dizziness (e.g., interferes with normal activities) AND [2] has NOT been evaluated by physician for this (Exception: dizziness caused by heat exposure,  sudden standing, or poor fluid intake) PLEASE NOTE: All timestamps contained within this report are represented as [place] Standard Time. CONFIDENTIALTY NOTICE: This fax transmission is intended only for the addressee. It contains information that is legally privileged, confidential or otherwise protected from use or disclosure. If you are not the intended recipient, you are strictly prohibited from reviewing, disclosing, copying using or disseminating any of this information or taking any action in reliance on or regarding this information. If you have received this fax in error, please notify us immediately by telephone so that we can arrange for its return to [name]. Phone: [phone], Toll-Free: [phone], Fax: [phone] Page: 2 of 2 Call Id: [id number] Final Disposition User See Physician within [place], RN, [place] states prior to triage that she has already made an appointment at the [facility] for 2:15 pm. Referrals REFERRED TO PCP OFFICE Disagree/Comply: Comply

## 2016-06-27 NOTE — Telephone Encounter (Signed)
°  Patient Name: [name]  DOB: Jun 19, 1930    Initial Comment Very dizzy and nauseated    Nurse Assessment  Nurse: Julien Girt, RN, Almyra Free Date/Time Eilene Ghazi Time): 06/27/2016 11:02:15 AM  Confirm and document reason for call. If symptomatic, describe symptoms. You must click the next button to save text entered. ---Caller states she has been feeling bad for over a week. She had had cold sx with dizziness and nausea that comes and goes. Denies vertigo. States the cold sx are getting better.  Has the patient traveled out of the country within the last 30 days? ---Not Applicable  Does the patient have any new or worsening symptoms? ---Yes  Will a triage be completed? ---Yes  Related visit to physician within the last 2 weeks? ---No  Does the PT have any chronic conditions? (i.e. diabetes, asthma, etc.) ---Yes  List chronic conditions. ---Htn  Is this a behavioral health or substance abuse call? ---No     Guidelines    Guideline Title Affirmed Question Affirmed Notes       Final Disposition User        Comments  Caller states prior to triage that she has already made an appointment at the [facility] for 2:15 pm.

## 2016-07-11 ENCOUNTER — Ambulatory Visit (INDEPENDENT_AMBULATORY_CARE_PROVIDER_SITE_OTHER): Payer: Medicare Other | Admitting: General Practice

## 2016-07-11 DIAGNOSIS — Z5181 Encounter for therapeutic drug level monitoring: Secondary | ICD-10-CM | POA: Diagnosis not present

## 2016-07-11 DIAGNOSIS — Z23 Encounter for immunization: Secondary | ICD-10-CM | POA: Diagnosis not present

## 2016-07-11 DIAGNOSIS — I2699 Other pulmonary embolism without acute cor pulmonale: Secondary | ICD-10-CM

## 2016-07-11 DIAGNOSIS — Z7901 Long term (current) use of anticoagulants: Secondary | ICD-10-CM

## 2016-07-11 LAB — POCT INR: INR: 2.3

## 2016-07-25 ENCOUNTER — Ambulatory Visit: Payer: Medicare Other | Admitting: Internal Medicine

## 2016-08-02 ENCOUNTER — Ambulatory Visit (INDEPENDENT_AMBULATORY_CARE_PROVIDER_SITE_OTHER): Payer: Medicare Other | Admitting: General Practice

## 2016-08-02 ENCOUNTER — Ambulatory Visit (INDEPENDENT_AMBULATORY_CARE_PROVIDER_SITE_OTHER): Payer: Medicare Other | Admitting: Internal Medicine

## 2016-08-02 ENCOUNTER — Encounter: Payer: Self-pay | Admitting: Internal Medicine

## 2016-08-02 ENCOUNTER — Other Ambulatory Visit (INDEPENDENT_AMBULATORY_CARE_PROVIDER_SITE_OTHER): Payer: Medicare Other

## 2016-08-02 VITALS — BP 140/80 | HR 84 | Resp 20 | Wt 151.0 lb

## 2016-08-02 DIAGNOSIS — Z7901 Long term (current) use of anticoagulants: Secondary | ICD-10-CM

## 2016-08-02 DIAGNOSIS — Z5181 Encounter for therapeutic drug level monitoring: Secondary | ICD-10-CM | POA: Diagnosis not present

## 2016-08-02 DIAGNOSIS — R531 Weakness: Secondary | ICD-10-CM

## 2016-08-02 DIAGNOSIS — R300 Dysuria: Secondary | ICD-10-CM

## 2016-08-02 DIAGNOSIS — K219 Gastro-esophageal reflux disease without esophagitis: Secondary | ICD-10-CM | POA: Diagnosis not present

## 2016-08-02 DIAGNOSIS — I2699 Other pulmonary embolism without acute cor pulmonale: Secondary | ICD-10-CM

## 2016-08-02 DIAGNOSIS — J309 Allergic rhinitis, unspecified: Secondary | ICD-10-CM | POA: Diagnosis not present

## 2016-08-02 LAB — CBC WITH DIFFERENTIAL/PLATELET
BASOS ABS: 0 10*3/uL (ref 0.0–0.1)
Basophils Relative: 0 % (ref 0.0–3.0)
Eosinophils Absolute: 0.1 10*3/uL (ref 0.0–0.7)
Eosinophils Relative: 1 % (ref 0.0–5.0)
HCT: 43.4 % (ref 36.0–46.0)
Hemoglobin: 14.6 g/dL (ref 12.0–15.0)
LYMPHS ABS: 2.3 10*3/uL (ref 0.7–4.0)
Lymphocytes Relative: 25 % (ref 12.0–46.0)
MCHC: 33.7 g/dL (ref 30.0–36.0)
MCV: 93.6 fl (ref 78.0–100.0)
MONO ABS: 0.8 10*3/uL (ref 0.1–1.0)
MONOS PCT: 8.5 % (ref 3.0–12.0)
NEUTROS ABS: 5.9 10*3/uL (ref 1.4–7.7)
NEUTROS PCT: 65.5 % (ref 43.0–77.0)
PLATELETS: 285 10*3/uL (ref 150.0–400.0)
RBC: 4.64 Mil/uL (ref 3.87–5.11)
RDW: 13.7 % (ref 11.5–15.5)
WBC: 9.1 10*3/uL (ref 4.0–10.5)

## 2016-08-02 LAB — URINALYSIS, ROUTINE W REFLEX MICROSCOPIC
Bilirubin Urine: NEGATIVE
Ketones, ur: NEGATIVE
Leukocytes, UA: NEGATIVE
Nitrite: NEGATIVE
PH: 5.5 (ref 5.0–8.0)
Total Protein, Urine: NEGATIVE
UROBILINOGEN UA: 0.2 (ref 0.0–1.0)
Urine Glucose: NEGATIVE

## 2016-08-02 LAB — HEPATIC FUNCTION PANEL
ALK PHOS: 77 U/L (ref 39–117)
ALT: 15 U/L (ref 0–35)
AST: 17 U/L (ref 0–37)
Albumin: 4 g/dL (ref 3.5–5.2)
BILIRUBIN DIRECT: 0.1 mg/dL (ref 0.0–0.3)
TOTAL PROTEIN: 8 g/dL (ref 6.0–8.3)
Total Bilirubin: 0.6 mg/dL (ref 0.2–1.2)

## 2016-08-02 LAB — BASIC METABOLIC PANEL
BUN: 11 mg/dL (ref 6–23)
CALCIUM: 10 mg/dL (ref 8.4–10.5)
CHLORIDE: 98 meq/L (ref 96–112)
CO2: 30 meq/L (ref 19–32)
Creatinine, Ser: 0.57 mg/dL (ref 0.40–1.20)
GFR: 107.32 mL/min (ref >60.00)
Glucose, Bld: 99 mg/dL (ref 70–99)
POTASSIUM: 4.4 meq/L (ref 3.5–5.1)
SODIUM: 134 meq/L — AB (ref 135–145)

## 2016-08-02 LAB — POCT INR: INR: 1.9

## 2016-08-02 LAB — TSH: TSH: 2.62 u[IU]/mL (ref 0.35–4.50)

## 2016-08-02 MED ORDER — ONDANSETRON HCL 4 MG PO TABS: 4.0000 mg | ORAL_TABLET | Freq: Three times a day (TID) | ORAL | 0 refills | Status: DC | PRN

## 2016-08-02 MED ORDER — PREDNISONE 10 MG PO TABS: ORAL_TABLET | ORAL | 0 refills | Status: DC

## 2016-08-02 MED ORDER — FAMOTIDINE 40 MG PO TABS: 40.0000 mg | ORAL_TABLET | Freq: Every day | ORAL | 3 refills | Status: DC

## 2016-08-02 MED ORDER — MONTELUKAST SODIUM 10 MG PO TABS: 10.0000 mg | ORAL_TABLET | Freq: Every day | ORAL | 3 refills | Status: DC

## 2016-08-02 NOTE — Progress Notes (Signed)
I have reviewed and agree with the plan. 

## 2016-08-02 NOTE — Patient Instructions (Signed)
Pre visit review using our clinic review tool, if applicable. No additional management support is needed unless otherwise documented below in the visit note. 

## 2016-08-02 NOTE — Patient Instructions (Addendum)
Please take all new medication as prescribed - the pepcid, singulair, prednisone, and zofran for nausea if needed  You left ear was irrigated of wax today  Please continue all other medications as before, and refills have been done if requested.  Please have the pharmacy call with any other refills you may need.  Please continue your efforts at being more active, low cholesterol diet, and weight control   Please keep your appointments with your specialists as you may have planned  Please go to the LAB in the Basement (turn left off the elevator) for the tests to be done today  You will be contacted by phone if any changes need to be made immediately.  Otherwise, you will receive a letter about your results with an explanation, but please check with MyChart first.  Please remember to sign up for MyChart if you have not done so, as this will be important to you in the future with finding out test results, communicating by private email, and scheduling acute appointments online when needed.  Please return in 6 months, or sooner if needed

## 2016-08-02 NOTE — Progress Notes (Signed)
Pre visit review using our clinic review tool, if applicable. No additional management support is needed unless otherwise documented below in the visit note. 

## 2016-08-02 NOTE — Progress Notes (Signed)
Subjective:    Patient ID: [name], female    DOB: 06-05-1930, 82 y.o.   MRN: [id number]  HPI   Here to f/u with several complaints, including mild worsening reflux and some nausea with eating but Denies worsening abd pain, dysphagia,  bowel change or blood.  Does have several wks ongoing nasal allergy symptoms with clearish congestion, itch and sneezing, without fever, pain, ST, cough, swelling or wheezing, with right ear fullness and popping, some hearing loss.  Also with mild dysuria x 2 days, but Denies urinary symptoms such as frequency, urgency, flank pain, hematuria or n/v, fever, chills.  Has some persistent sob, and occasional wheezing.  Does c/o ongoing fatigue, but denies signficant daytime hypersomnolence. Past Medical History:  Diagnosis Date  . ALLERGIC RHINITIS   . ANXIETY   . Bronchiectasis 12/11/2011   CT chest dx  . CAROTID ARTERY STENOSIS, RIGHT   . Chronic rhinitis   . COMMON MIGRAINE   . Complication of anesthesia    dizzy  . COPD   . CORONARY ARTERY DISEASE   . CYST/PSEUDOCYST, PANCREAS   . DEPRESSION   . DIVERTICULOSIS, COLON   . DIZZINESS, CHRONIC   . Esophageal stricture   . GERD   . HYPERLIPIDEMIA   . HYPERTENSION   . OSTEOPOROSIS   . OVERACTIVE BLADDER   . Pulmonary embolism ([place]) 2008  . PULMONARY HYPERTENSION, SECONDARY   . Stroke ([place])   . Tubular adenoma of colon 10/2013  . Unspecified hearing loss    Past Surgical History:  Procedure Laterality Date  . ABDOMINAL HYSTERECTOMY  1965  . APPENDECTOMY  1963  . CHOLECYSTECTOMY  1963  . COLONOSCOPY N/A 10/19/2013   Procedure: COLONOSCOPY;  Surgeon: Ladene Artist, MD;  Location: [facility] ENDOSCOPY;  Service: Endoscopy;  Laterality: N/A;  . EGD  1999  . PANCREATIC CYST DRAINAGE     x 2  . s/p sinus surgury    . TONSILLECTOMY      reports that she has never smoked. She has never used smokeless tobacco. She reports that she does not drink alcohol or use drugs. family history includes Breast  cancer in her daughter; Emphysema in her father; Heart disease in her mother; Lymphoma in her sister; Stroke in her father. Allergies  Allergen Reactions  . Amoxicillin-Pot Clavulanate     REACTION: diarrhea Has patient had a PCN reaction causing immediate rash, facial/tongue/throat swelling, SOB or lightheadedness with hypotension: unknown Has patient had a PCN reaction causing severe rash involving mucus membranes or skin necrosis: unknown Has patient had a PCN reaction that required hospitalization : unknown Has patient had a PCN reaction occurring within the last 10 years: yes   pt states she should be able to take penicillin, but cant remember actually taking it   . Ciclesonide     REACTION: bad smell  . Ciprofloxacin Other (See Comments)    dizziness  . Raloxifene     REACTION: risk of recurrent stroke  . Sulfonamide Derivatives     REACTION: tongue swells   Current Outpatient Prescriptions on File Prior to Visit  Medication Sig Dispense Refill  . albuterol (PROVENTIL HFA;VENTOLIN HFA) 108 (90 BASE) MCG/ACT inhaler Inhale 2 puffs into the lungs every 6 (six) hours as needed for wheezing or shortness of breath. 1 Inhaler 11  . amLODipine (NORVASC) 5 MG tablet TAKE 1 TABLET EVERY DAY 90 tablet 1  . aspirin 81 MG EC tablet Take 81 mg by mouth daily.     [name]  budesonide (RHINOCORT ALLERGY) 32 MCG/ACT nasal spray Place 1 spray into both nostrils daily.    . calcium carbonate (TUMS) 500 MG chewable tablet Chew 1 tablet by mouth daily as needed.      . Digestive Enzymes (PAPAYA AND ENZYMES) CHEW Chew 2 tablets by mouth 3 (three) times daily with meals as needed (digestion of big meals).     [name] ipratropium-albuterol (DUONEB) 0.5-2.5 (3) MG/3ML SOLN Take 3 mLs by nebulization every 6 (six) hours as needed. 360 mL 11  . loratadine (CLARITIN) 10 MG tablet Take 10 mg by mouth daily as needed for allergies.     [name] losartan (COZAAR) 100 MG tablet TAKE 1 TABLET DAILY. 90 tablet 1  . meclizine  (ANTIVERT) 12.5 MG tablet Take 1 tablet (12.5 mg total) by mouth 3 (three) times daily as needed for dizziness. 40 tablet 3  . Multiple Vitamins-Minerals (MACUVITE) TABS Take 1 tablet by mouth daily.      . pantoprazole (PROTONIX) 40 MG tablet Take 1 tablet (40 mg total) by mouth 2 (two) times daily. (Patient taking differently: Take 40 mg by mouth 2 (two) times daily as needed (heartburn/acid reflux). ) 180 tablet 3  . Probiotic Product (PROBIOTIC DAILY PO) Take 1 tablet by mouth daily.    [name] Respiratory Therapy Supplies (FLUTTER) DEVI As directed 1 each 0  . warfarin (COUMADIN) 5 MG tablet TAKE AS DIRECTED BY ANTICOAGULATION CLINIC 90 tablet 0   No current facility-administered medications on file prior to visit.    Review of Systems  Constitutional: Negative for unusual diaphoresis or night sweats HENT: Negative for ear swelling or discharge Eyes: Negative for worsening visual haziness  Respiratory: Negative for choking and stridor.   Gastrointestinal: Negative for distension or worsening eructation Genitourinary: Negative for retention or change in urine volume.  Musculoskeletal: Negative for other MSK pain or swelling Skin: Negative for color change and worsening wound Neurological: Negative for tremors and numbness other than noted , but has had recurring dizziness chronic Psychiatric/Behavioral: Negative for decreased concentration or agitation other than above   All other system neg per pt    Objective:   Physical Exam BP 140/80   Pulse 84   Resp 20   Wt 151 lb (68.5 kg)   SpO2 98%   BMI 25.13 kg/m  VS noted,  Constitutional: Pt appears in no apparent distress HENT: Head: NCAT.  Right Ear: External ear normal.  Left Ear: External ear normal.  Bilat TM's mild erythema Eyes: . Pupils are equal, round, and reactive to light. Conjunctivae and EOM are normal Bilat tm's with mild erythema.  Max sinus areas non tender.  Pharynx with mild erythema, no exudate Neck: Normal range  of motion. Neck supple.  Cardiovascular: Normal rate and regular rhythm.   Pulmonary/Chest: Effort normal and breath sounds without rales few bilat expt wheezing only.  Abd:  Soft, NT, ND, + BS, no flank tender Neurological: Pt is alert. Not confused , motor grossly intact Skin: Skin is warm. No rash, no LE edema Psychiatric: Pt behavior is normal. No agitation.     Assessment & Plan:

## 2016-08-03 LAB — URINE CULTURE

## 2016-08-04 NOTE — Assessment & Plan Note (Signed)
Mild to mod, for add pepcid,  to f/u any worsening symptoms or concerns

## 2016-08-04 NOTE — Assessment & Plan Note (Signed)
With seasonal flare, for predpac asd,  to f/u any worsening symptoms or concerns 

## 2016-08-04 NOTE — Assessment & Plan Note (Signed)
Cant r/o UTI , for urine studies,  to f/u any worsening symptoms or concerns

## 2016-08-04 NOTE — Assessment & Plan Note (Signed)
Etiology unclear, Exam otherwise benign, to check labs as documented, follow with expectant management  

## 2016-08-06 ENCOUNTER — Telehealth: Payer: Self-pay | Admitting: Internal Medicine

## 2016-08-06 NOTE — Telephone Encounter (Signed)
Pt is wanting to schedule a prolia injection and she is wanting to go over her lab work to be sure there is nothing going on. From what I understand she is not getting the prolia done here.  Can you please give her a call to talk about her recent lab work.

## 2016-08-07 NOTE — Telephone Encounter (Signed)
Routing to dr john---dr [name] has to evaluate labs for this request and then advise if prolia is needed/appropriate---please advise corrine what to tell patient, thanks

## 2016-08-07 NOTE — Telephone Encounter (Signed)
All my patients know that if there is a significant abnormal blood test, I would call about this, or have the nurse call   There were no significant lab abnormals recently and this can be reviewed at next visit  If she has a specific question, I can try to answer this through the staff, but I cannot make phone consults to pts personally about labs when no there is no further need about this  I dont see any recent DXA scan on our EMR so I would not know what to say about prolia.  Has pt had a DXA done here or elsewhere, maybe we can look further for results

## 2016-08-07 NOTE — Telephone Encounter (Signed)
Called patient unable to reach left message to give us a call back.

## 2016-08-08 ENCOUNTER — Ambulatory Visit: Payer: Medicare Other

## 2016-09-03 ENCOUNTER — Ambulatory Visit: Payer: Medicare Other

## 2016-09-05 ENCOUNTER — Ambulatory Visit (INDEPENDENT_AMBULATORY_CARE_PROVIDER_SITE_OTHER): Payer: Medicare Other | Admitting: General Practice

## 2016-09-05 DIAGNOSIS — E559 Vitamin D deficiency, unspecified: Secondary | ICD-10-CM | POA: Diagnosis not present

## 2016-09-05 DIAGNOSIS — Z5181 Encounter for therapeutic drug level monitoring: Secondary | ICD-10-CM | POA: Diagnosis not present

## 2016-09-05 DIAGNOSIS — M810 Age-related osteoporosis without current pathological fracture: Secondary | ICD-10-CM | POA: Diagnosis not present

## 2016-09-05 DIAGNOSIS — Z7901 Long term (current) use of anticoagulants: Secondary | ICD-10-CM

## 2016-09-05 DIAGNOSIS — I2699 Other pulmonary embolism without acute cor pulmonale: Secondary | ICD-10-CM

## 2016-09-05 LAB — POCT INR: INR: 2

## 2016-09-05 NOTE — Progress Notes (Signed)
I agree with this plan.

## 2016-09-05 NOTE — Patient Instructions (Signed)
Pre visit review using our clinic review tool, if applicable. No additional management support is needed unless otherwise documented below in the visit note. 

## 2016-09-11 DIAGNOSIS — L821 Other seborrheic keratosis: Secondary | ICD-10-CM | POA: Diagnosis not present

## 2016-09-11 DIAGNOSIS — Z85828 Personal history of other malignant neoplasm of skin: Secondary | ICD-10-CM | POA: Diagnosis not present

## 2016-09-11 DIAGNOSIS — L820 Inflamed seborrheic keratosis: Secondary | ICD-10-CM | POA: Diagnosis not present

## 2016-09-11 DIAGNOSIS — D485 Neoplasm of uncertain behavior of skin: Secondary | ICD-10-CM | POA: Diagnosis not present

## 2016-09-19 ENCOUNTER — Ambulatory Visit (INDEPENDENT_AMBULATORY_CARE_PROVIDER_SITE_OTHER): Payer: Medicare Other | Admitting: Emergency Medicine

## 2016-09-19 ENCOUNTER — Encounter: Payer: Self-pay | Admitting: Emergency Medicine

## 2016-09-19 VITALS — BP 122/80 | HR 92 | Ht 65.0 in | Wt 149.8 lb

## 2016-09-19 DIAGNOSIS — R0609 Other forms of dyspnea: Secondary | ICD-10-CM | POA: Diagnosis not present

## 2016-09-19 NOTE — Progress Notes (Signed)
Subjective:    Patient ID: [name], female    DOB: Oct 14, 1929   MRN: [id number]  HPI 80  yowf never smoker, allergic rhinitis, hx bilat PE in September 2008, with asociated Pulm HTN (PASP 70 with intact LV fxn). Moderately Severe obstruction on pulmonary function testing from 2009 With hypoxemia . Also with a history of GERD and allergic rhinitis That contribute chronic cough.  She has bronchiectasis noted On CT scan of the chest with some associated micronodular disease particularly in the right middle lobe and lower lobes bilaterally. She experiences frequent episodes of bronchitis and acute exacerbations of her bronchiectasis that require antibiotic treatment. Her last appears to have been in January 2017 when she was seen in this office.    She is using rhinocort, taking loratadine every qhs. She has albuterol that she uses prn, no other BD's at this time. She uses a flutter valve every other day. She is using o2 at 1l/min with housework, chores. Using mucinex as needed. She feels that she is doing fairly well.  She has some L ear fullness and pressure. She has throat clearing. She has every day cough - yellow to green .   ROV 06/13/16 -- Patient follows up today for history of Bronchiectasis with associated moderately severe obstructive lung disease, question mycobacterial colonization. Also with allergic rhinitis currently managed on Rhinocort and loratadine. At our last visit we tried starting Symbicort twice a day to see if she would benefit. She seemed to get a lot of rhinitis, cough. Also had some anxiousness and heart racing. She stopped it after 2 days. She is doing well currently. She has nebulized albuterol available to use prn, not needing currently. She uses O2 prn. Reliable with rhinocort and loratadine.   ROV 09/19/16 -- This is a follow-up visit for history of COPD, moderately severe obstructive lung disease in the setting of bronchiectasis and possible mycobacterial  colonization. She also has hx remote PE, PAH, allergic rhinitis. She tells me that she is feeling a bit less energetic. She is independent. Her breathing has been good - does get congestion, some cough. Remains on rhinocort, loratadine, singulair. She was treated with pred + abx for bronchitis last month > helped her. Uses duoneb about once a day.         Objective:   Physical Exam Vitals:   09/19/16 1514  BP: 122/80  BP Location: Left Arm  Patient Position: Sitting  Cuff Size: Normal  Pulse: 92  SpO2: 93%  Weight: 149 lb 12.8 oz (67.9 kg)  Height: 5\' 5"  (1.651 m)   Gen: Pleasant, well-nourished, in no distress,  normal affect  ENT: No lesions,  mouth clear,  oropharynx clear, no postnasal drip  Neck: No JVD, no TMG, no carotid bruits  Lungs: No use of accessory muscles, Focal expiratory wheezing in the right midlung zone. Left is clear  Cardiovascular: RRR, heart sounds normal, no murmur or gallops, no peripheral edema  Musculoskeletal: No deformities, no cyanosis or clubbing  Neuro: alert, non focal  Skin: Warm, no lesions or rashes    10/25/15 --  COMPARISON:  December 11, 2011 CT scan  FINDINGS: The trachea and mainstem bronchi are normal and unchanged. Bronchiectasis is identified in the right middle and lower lobes. Multiple nodules are seen as well, particularly in the right middle lobe, lingula, and right lower lobe. Tree in bud opacities are identified as well. There are opacified airway is in the right middle lobe and right lower lobe, most  likely due to inspissated mucus secondary to bronchiectasis. These findings have increased in the interval. There is also opacity in the right middle lobe medially consistent with atelectasis. No suspicious masses or acute infiltrates. Mixed attenuation of the lungs with alternating areas of increased and decreased attenuation could represent air trapping, especially in the light of obvious airways disease. The  patient however does have a history of previous pulmonary emboli which could lead to a similar appearance.  Atherosclerotic changes seen in the thoracic aorta. No aneurysm or dissection. There is a small hiatal hernia. Coronary artery calcifications. The heart is unchanged. A linear density projected in a right pulmonary artery near the hilum is consistent with sequela of previous pulmonary emboli. No acute emboli identified on today's study. No acute pulmonary emboli identified on today's study.  Evaluation of the upper abdomen is limited but unremarkable.  No acute bony abnormalities.  Review of the MIP images confirms the above findings.  IMPRESSION: 1. No acute pulmonary emboli. 2. The nodularity and bronchiectasis in the lungs is consistent with chronic MAI infection.       Assessment & Plan:  COPD (chronic obstructive pulmonary disease) ([place]) We will continue your Duoneb as needed.  We will order a portable oxygen concentrator through [place] Follow with Dr [name] in 6 months or sooner if you have any problems  Allergic rhinitis Continue your singulair, Rhinocort, loratadine as you are taking them   Bronchiectasis  We will discuss timing of a repeat CT scan at your next visit.   Baltazar Apo, MD, PhD 09/19/2016, 3:42 PM [facility] [phone] or if no answer [phone]

## 2016-09-19 NOTE — Patient Instructions (Addendum)
We will continue your Duoneb as needed.  Continue your singulair, Rhinocort, loratadine as you are taking them  We will discuss timing of a repeat CT scan at your next visit.  We will order a portable oxygen concentrator through [place] Follow with Dr [name] in 6 months or sooner if you have any problems

## 2016-09-19 NOTE — Assessment & Plan Note (Signed)
Continue your singulair, Rhinocort, loratadine as you are taking them

## 2016-09-19 NOTE — Assessment & Plan Note (Signed)
  We will discuss timing of a repeat CT scan at your next visit.

## 2016-09-19 NOTE — Assessment & Plan Note (Signed)
We will continue your Duoneb as needed.  We will order a portable oxygen concentrator through [place] Follow with Dr [name] in 6 months or sooner if you have any problems

## 2016-09-24 ENCOUNTER — Other Ambulatory Visit: Payer: Self-pay | Admitting: Internal Medicine

## 2016-09-25 ENCOUNTER — Other Ambulatory Visit: Payer: Self-pay | Admitting: Internal Medicine

## 2016-09-25 ENCOUNTER — Encounter: Payer: Self-pay | Admitting: Internal Medicine

## 2016-09-25 ENCOUNTER — Telehealth: Payer: Self-pay | Admitting: Internal Medicine

## 2016-09-25 NOTE — Telephone Encounter (Signed)
Pt called stating she requested a refill for her albuterol (PROVENTIL HFA;VENTOLIN HFA) 108 (90 BASE) MCG/ACT inhaler [id number]   From [facility] on [facility]. They say they can refill it but needs to get approval from Dr. [name].

## 2016-09-26 NOTE — Telephone Encounter (Signed)
This was sent to her pharmacy on 09/25/2016

## 2016-10-10 ENCOUNTER — Ambulatory Visit: Payer: Medicare Other

## 2016-10-26 ENCOUNTER — Ambulatory Visit: Payer: Medicare Other

## 2016-10-29 ENCOUNTER — Ambulatory Visit (INDEPENDENT_AMBULATORY_CARE_PROVIDER_SITE_OTHER): Payer: Medicare Other | Admitting: General Practice

## 2016-10-29 DIAGNOSIS — Z5181 Encounter for therapeutic drug level monitoring: Secondary | ICD-10-CM | POA: Diagnosis not present

## 2016-10-29 LAB — POCT INR: INR: 2.1

## 2016-10-29 NOTE — Progress Notes (Signed)
I agree with this plan.

## 2016-10-29 NOTE — Patient Instructions (Signed)
Pre visit review using our clinic review tool, if applicable. No additional management support is needed unless otherwise documented below in the visit note. 

## 2016-11-01 ENCOUNTER — Ambulatory Visit: Payer: Medicare Other | Admitting: Internal Medicine

## 2016-11-07 ENCOUNTER — Ambulatory Visit (INDEPENDENT_AMBULATORY_CARE_PROVIDER_SITE_OTHER): Payer: Medicare Other | Admitting: Adult Health

## 2016-11-07 ENCOUNTER — Encounter: Payer: Self-pay | Admitting: Adult Health

## 2016-11-07 ENCOUNTER — Ambulatory Visit: Payer: Medicare Other | Admitting: Nurse Practitioner

## 2016-11-07 ENCOUNTER — Ambulatory Visit (INDEPENDENT_AMBULATORY_CARE_PROVIDER_SITE_OTHER)
Admission: RE | Admit: 2016-11-07 | Discharge: 2016-11-07 | Disposition: A | Discharge status: Home / Self Care | Payer: Medicare Other | Source: Ambulatory Visit | Attending: Adult Health | Admitting: Adult Health

## 2016-11-07 VITALS — BP 146/72 | HR 76 | Temp 98.2°F | Ht 65.0 in | Wt 149.4 lb

## 2016-11-07 DIAGNOSIS — R0602 Shortness of breath: Secondary | ICD-10-CM

## 2016-11-07 DIAGNOSIS — J209 Acute bronchitis, unspecified: Secondary | ICD-10-CM | POA: Diagnosis not present

## 2016-11-07 DIAGNOSIS — J9611 Chronic respiratory failure with hypoxia: Secondary | ICD-10-CM | POA: Diagnosis not present

## 2016-11-07 MED ORDER — CEFDINIR 300 MG PO CAPS: 300.0000 mg | ORAL_CAPSULE | Freq: Two times a day (BID) | ORAL | 0 refills | Status: DC

## 2016-11-07 NOTE — Progress Notes (Signed)
@Patient  ID: [name], female    DOB: 14-Apr-1930, 83 y.o.   MRN: [id number]  Chief Complaint  Patient presents with  . Acute Visit    Cough     Referring provider: [name], MD  HPI: 83 yo female never smoker followed for AR, Bilateral PE in 2009 w/ associated Pulm HTN, Bronchiectasis   11/07/16  Acute OV  Pt presents for an acute office visit. She complains of 1-2 week of cough, congestion w/ thick mucus and malaise . She denies chest pain or orthopnea, edema.  She remains on Singulair . Has Duoneb that she uses As needed   No fever or body aches.  Has taken some otc cold meds without much help.   Uses O2 with act at 2l/m .    Allergies  Allergen Reactions  . Amoxicillin-Pot Clavulanate     REACTION: diarrhea Has patient had a PCN reaction causing immediate rash, facial/tongue/throat swelling, SOB or lightheadedness with hypotension: unknown Has patient had a PCN reaction causing severe rash involving mucus membranes or skin necrosis: unknown Has patient had a PCN reaction that required hospitalization : unknown Has patient had a PCN reaction occurring within the last 10 years: yes   pt states she should be able to take penicillin, but cant remember actually taking it   . Ciclesonide     REACTION: bad smell  . Ciprofloxacin Other (See Comments)    dizziness  . Raloxifene     REACTION: risk of recurrent stroke  . Sulfonamide Derivatives     REACTION: tongue swells    Immunization History  Administered Date(s) Administered  . Influenza Split 07/11/2011, 09/07/2012, 07/30/2013  . Influenza Whole 07/05/2008, 08/02/2009  . Influenza, High Dose Seasonal PF 07/11/2016  . Influenza,inj,Quad PF,36+ Mos 07/27/2014  . Influenza-Unspecified 08/09/2015  . Pneumococcal Conjugate-13 08/28/2013  . Pneumococcal Polysaccharide-23 04/06/2009  . Td 04/06/2009    Past Medical History:  Diagnosis Date  . ALLERGIC RHINITIS   . ANXIETY   . Bronchiectasis 12/11/2011   CT chest dx  . CAROTID ARTERY STENOSIS, RIGHT   . Chronic rhinitis   . COMMON MIGRAINE   . Complication of anesthesia    dizzy  . COPD   . CORONARY ARTERY DISEASE   . CYST/PSEUDOCYST, PANCREAS   . DEPRESSION   . DIVERTICULOSIS, COLON   . DIZZINESS, CHRONIC   . Esophageal stricture   . GERD   . HYPERLIPIDEMIA   . HYPERTENSION   . OSTEOPOROSIS   . OVERACTIVE BLADDER   . Pulmonary embolism ([place]) 2008  . PULMONARY HYPERTENSION, SECONDARY   . Stroke ([place])   . Tubular adenoma of colon 10/2013  . Unspecified hearing loss     Tobacco History: History  Smoking Status  . Never Smoker  Smokeless Tobacco  . Never Used    Comment: spouse smoked in the home for 15 years   Counseling given: Not Answered   Outpatient Encounter Prescriptions as of 11/07/2016  Medication Sig  . amLODipine (NORVASC) 5 MG tablet TAKE 1 TABLET EVERY DAY  . aspirin 81 MG EC tablet Take 81 mg by mouth daily.   . budesonide (RHINOCORT ALLERGY) 32 MCG/ACT nasal spray Place 1 spray into both nostrils daily.  . Digestive Enzymes (PAPAYA AND ENZYMES) CHEW Chew 2 tablets by mouth 3 (three) times daily with meals as needed (digestion of big meals).   . famotidine (PEPCID) 40 MG tablet Take 1 tablet (40 mg total) by mouth daily.  [name] ipratropium-albuterol (DUONEB)  0.5-2.5 (3) MG/3ML SOLN Take 3 mLs by nebulization every 6 (six) hours as needed.  . loratadine (CLARITIN) 10 MG tablet Take 10 mg by mouth daily as needed for allergies.   [name] losartan (COZAAR) 100 MG tablet TAKE 1 TABLET EVERY DAY  . meclizine (ANTIVERT) 12.5 MG tablet Take 1 tablet (12.5 mg total) by mouth 3 (three) times daily as needed for dizziness.  . montelukast (SINGULAIR) 10 MG tablet Take 1 tablet (10 mg total) by mouth daily.  . Multiple Vitamins-Minerals (MACUVITE) TABS Take 1 tablet by mouth daily.    . Probiotic Product (PROBIOTIC DAILY PO) Take 1 tablet by mouth daily.  . VENTOLIN HFA 108 (90 Base) MCG/ACT inhaler INHALE 2 PUFFS INTO THE LUNGS  EVERY 6 HOURS AS NEEDED FOR WHEEZING OR SHORTNESS OF BREATH.  . warfarin (COUMADIN) 5 MG tablet TAKE AS DIRECTED BY ANTICOAGULATION CLINIC  . cefdinir (OMNICEF) 300 MG capsule Take 1 capsule (300 mg total) by mouth 2 (two) times daily.  [name] Respiratory Therapy Supplies (FLUTTER) DEVI As directed (Patient not taking: Reported on 11/07/2016)   No facility-administered encounter medications on file as of 11/07/2016.      Review of Systems  Constitutional:   No  weight loss, night sweats,  Fevers, chills, +fatigue, or  lassitude.  HEENT:   No headaches,  Difficulty swallowing,  Tooth/dental problems, or  Sore throat,                No sneezing, itching, ear ache,  +nasal congestion, post nasal drip,   CV:  No chest pain,  Orthopnea, PND, swelling in lower extremities, anasarca, dizziness, palpitations, syncope.   GI  No heartburn, indigestion, abdominal pain, nausea, vomiting, diarrhea, change in bowel habits, loss of appetite, bloody stools.   Resp: No shortness of breath with exertion or at rest.  No excess mucus, no productive cough,  No non-productive cough,  No coughing up of blood.  No change in color of mucus.  No wheezing.  No chest wall deformity  Skin: no rash or lesions.  GU: no dysuria, change in color of urine, no urgency or frequency.  No flank pain, no hematuria   MS:  No joint pain or swelling.  No decreased range of motion.  No back pain.    Physical Exam  BP (!) 146/72 (BP Location: Left Arm, Cuff Size: Normal)   Pulse 76   Temp 98.2 F (36.8 C) (Oral)   Ht 5\' 5"  (1.651 m)   Wt 149 lb 6.4 oz (67.8 kg)   SpO2 92%   BMI 24.86 kg/m   GEN: A/Ox3; pleasant , NAD elderly    HEENT:  [place]/AT,  EACs-clear, TMs-wnl, NOSE-clear drainage , THROAT-clear, no lesions, no postnasal drip or exudate noted.   NECK:  Supple w/ fair ROM; no JVD; normal carotid impulses w/o bruits; no thyromegaly or nodules palpated; no lymphadenopathy.    RESP  Few trace rhonchi  no accessory  muscle use, no dullness to percussion  CARD:  RRR, no m/r/g, no peripheral edema, pulses intact, no cyanosis or clubbing.  GI:   Soft & nt; nml bowel sounds; no organomegaly or masses detected.   Musco: Warm bil, no deformities or joint swelling noted.   Neuro: alert, no focal deficits noted.    Skin: Warm, no lesions or rashes  Psych:  No change in mood or affect. No depression or anxiety.  No memory loss.  Lab Results:  CBC   BMET  BNP No results found  for: BNP  ProBNP No results found for: PROBNP  Imaging:  Assessment & Plan:   No problem-specific Assessment & Plan notes found for this encounter.     Rexene Edison, NP 11/08/2016

## 2016-11-07 NOTE — Patient Instructions (Addendum)
Omnicef 300mg  Twice daily  For 7 days . , take w/ food  Mucinex DM Twice daily  As needed  Cough/congestion .  Fluids and rest  Notify Coumadin clinic that you have started an antibiotic.  Follow up with  Dr. [name]  In 2-3 months and As needed   Please contact office for sooner follow up if symptoms do not improve or worsen or seek emergency care   Late add 11/08/2016  CXR shows RUL PNA  She is to finish abx and follow up cxr in 2-3 weeks

## 2016-11-08 ENCOUNTER — Other Ambulatory Visit: Payer: Self-pay | Admitting: Adult Health

## 2016-11-08 DIAGNOSIS — J189 Pneumonia, unspecified organism: Secondary | ICD-10-CM

## 2016-11-08 NOTE — Assessment & Plan Note (Signed)
Flare  Check cxr   Plan  Patient Instructions  Omnicef 300mg  Twice daily  For 7 days . , take w/ food  Mucinex DM Twice daily  As needed  Cough/congestion .  Fluids and rest  Notify Coumadin clinic that you have started an antibiotic.  Follow up with  Dr. [name]  In 2-3 months and As needed   Please contact office for sooner follow up if symptoms do not improve or worsen or seek emergency care

## 2016-11-08 NOTE — Assessment & Plan Note (Signed)
Cont on O2 2l/m w/ act

## 2016-11-08 NOTE — Progress Notes (Signed)
Called spoke with patient, advised of cxr results / recs as stated by TP.  Pt verbalized her understanding and denied any questions.  Follow up scheduled for 2.14.18 @ 1145, pt will arrive early to appt for cxr prior.  Orders only encounter created for cxr order.

## 2016-11-21 ENCOUNTER — Encounter: Payer: Self-pay | Admitting: Adult Health

## 2016-11-21 ENCOUNTER — Ambulatory Visit (INDEPENDENT_AMBULATORY_CARE_PROVIDER_SITE_OTHER)
Admission: RE | Admit: 2016-11-21 | Discharge: 2016-11-21 | Disposition: A | Discharge status: Home / Self Care | Payer: Medicare Other | Source: Ambulatory Visit | Attending: Adult Health | Admitting: Adult Health

## 2016-11-21 ENCOUNTER — Ambulatory Visit (INDEPENDENT_AMBULATORY_CARE_PROVIDER_SITE_OTHER): Payer: Medicare Other | Admitting: Adult Health

## 2016-11-21 ENCOUNTER — Ambulatory Visit (INDEPENDENT_AMBULATORY_CARE_PROVIDER_SITE_OTHER): Payer: Medicare Other | Admitting: General Practice

## 2016-11-21 DIAGNOSIS — J181 Lobar pneumonia, unspecified organism: Secondary | ICD-10-CM

## 2016-11-21 DIAGNOSIS — J189 Pneumonia, unspecified organism: Secondary | ICD-10-CM

## 2016-11-21 DIAGNOSIS — Z7901 Long term (current) use of anticoagulants: Secondary | ICD-10-CM

## 2016-11-21 DIAGNOSIS — J449 Chronic obstructive pulmonary disease, unspecified: Secondary | ICD-10-CM | POA: Diagnosis not present

## 2016-11-21 DIAGNOSIS — J471 Bronchiectasis with (acute) exacerbation: Secondary | ICD-10-CM | POA: Diagnosis not present

## 2016-11-21 DIAGNOSIS — Z5181 Encounter for therapeutic drug level monitoring: Secondary | ICD-10-CM

## 2016-11-21 DIAGNOSIS — I2699 Other pulmonary embolism without acute cor pulmonale: Secondary | ICD-10-CM

## 2016-11-21 LAB — POCT INR: INR: 2.7

## 2016-11-21 MED ORDER — LEVOFLOXACIN 500 MG PO TABS: 500.0000 mg | ORAL_TABLET | Freq: Every day | ORAL | 0 refills | Status: AC

## 2016-11-21 NOTE — Assessment & Plan Note (Signed)
Slow to resolve w/ persistent sx   Plan  Patient Instructions  Levaquin 500mg   For 7 days . , take w/ food  Mucinex DM Twice daily  As needed  Cough/congestion .  Fluids and rest  Notify Coumadin clinic that you have started an antibiotic.  Follow up with  Dr. [name]  In 3 weeks with chest xray and As needed   Please contact office for sooner follow up if symptoms do not improve or worsen or seek emergency care

## 2016-11-21 NOTE — Patient Instructions (Addendum)
Levaquin 500mg   For 7 days . , take w/ food  Mucinex DM Twice daily  As needed  Cough/congestion .  Fluids and rest  Notify Coumadin clinic that you have started an antibiotic.  Follow up with  Dr. [name]  In 3 weeks with chest xray and As needed   Please contact office for sooner follow up if symptoms do not improve or worsen or seek emergency care

## 2016-11-21 NOTE — Progress Notes (Signed)
@Patient  ID: [name], female    DOB: 10/23/1929, 83 y.o.   MRN: [id number]  Chief Complaint  Patient presents with  . Follow-up    PNA     Referring provider: [name], MD  HPI: 83 yo female never smoker followed for AR, Bilateral PE in 2009 w/ associated Pulm HTN, Bronchiectasis   11/21/2016 Follow up: Bronchiectasis /CAP Patient presents for a two-week follow-up.  Patient was seen last visit for cough and congestion for 2 weeks. Patient was treated for bronchitis/right upper lobe pneumonia.. Chest x-ray showed a right upper lobe infiltrate. She was treated with 7 days of Omnicef.[name] He returns today with some improvement. She says she did feel good while she was on antibiotics, however, symptoms have persisted with congested cough, Initially had green mucus and is now yellow. On a persistent basis. She denies any fever, chest pain, orthopnea, PND, leg swelling. He denies any hemoptysis Chest x-ray today shows no significant change  . Right upper to right middle lobe markings   Allergies  Allergen Reactions  . Amoxicillin-Pot Clavulanate     REACTION: diarrhea Has patient had a PCN reaction causing immediate rash, facial/tongue/throat swelling, SOB or lightheadedness with hypotension: unknown Has patient had a PCN reaction causing severe rash involving mucus membranes or skin necrosis: unknown Has patient had a PCN reaction that required hospitalization : unknown Has patient had a PCN reaction occurring within the last 10 years: yes   pt states she should be able to take penicillin, but cant remember actually taking it   . Ciclesonide     REACTION: bad smell  . Ciprofloxacin Other (See Comments)    dizziness  . Raloxifene     REACTION: risk of recurrent stroke  . Sulfonamide Derivatives     REACTION: tongue swells    Immunization History  Administered Date(s) Administered  . Influenza Split 07/11/2011, 09/07/2012, 07/30/2013  . Influenza Whole 07/05/2008,  08/02/2009  . Influenza, High Dose Seasonal PF 07/11/2016  . Influenza,inj,Quad PF,36+ Mos 07/27/2014  . Influenza-Unspecified 08/09/2015  . Pneumococcal Conjugate-13 08/28/2013  . Pneumococcal Polysaccharide-23 04/06/2009  . Td 04/06/2009    Past Medical History:  Diagnosis Date  . ALLERGIC RHINITIS   . ANXIETY   . Bronchiectasis 12/11/2011   CT chest dx  . CAROTID ARTERY STENOSIS, RIGHT   . Chronic rhinitis   . COMMON MIGRAINE   . Complication of anesthesia    dizzy  . COPD   . CORONARY ARTERY DISEASE   . CYST/PSEUDOCYST, PANCREAS   . DEPRESSION   . DIVERTICULOSIS, COLON   . DIZZINESS, CHRONIC   . Esophageal stricture   . GERD   . HYPERLIPIDEMIA   . HYPERTENSION   . OSTEOPOROSIS   . OVERACTIVE BLADDER   . Pulmonary embolism ([place]) 2008  . PULMONARY HYPERTENSION, SECONDARY   . Stroke ([place])   . Tubular adenoma of colon 10/2013  . Unspecified hearing loss     Tobacco History: History  Smoking Status  . Never Smoker  Smokeless Tobacco  . Never Used    Comment: spouse smoked in the home for 15 years   Counseling given: Not Answered   Outpatient Encounter Prescriptions as of 11/21/2016  Medication Sig  . amLODipine (NORVASC) 5 MG tablet TAKE 1 TABLET EVERY DAY  . aspirin 81 MG EC tablet Take 81 mg by mouth daily.   . budesonide (RHINOCORT ALLERGY) 32 MCG/ACT nasal spray Place 1 spray into both nostrils daily.  . Digestive Enzymes (PAPAYA  AND ENZYMES) CHEW Chew 2 tablets by mouth 3 (three) times daily with meals as needed (digestion of big meals).   . famotidine (PEPCID) 40 MG tablet Take 1 tablet (40 mg total) by mouth daily.  [name] ipratropium-albuterol (DUONEB) 0.5-2.5 (3) MG/3ML SOLN Take 3 mLs by nebulization every 6 (six) hours as needed.  [name] losartan (COZAAR) 100 MG tablet TAKE 1 TABLET EVERY DAY  . meclizine (ANTIVERT) 12.5 MG tablet Take 1 tablet (12.5 mg total) by mouth 3 (three) times daily as needed for dizziness.  . montelukast (SINGULAIR) 10 MG tablet Take 1  tablet (10 mg total) by mouth daily.  . Multiple Vitamins-Minerals (MACUVITE) TABS Take 1 tablet by mouth daily.    . Probiotic Product (PROBIOTIC DAILY PO) Take 1 tablet by mouth daily.  . VENTOLIN HFA 108 (90 Base) MCG/ACT inhaler INHALE 2 PUFFS INTO THE LUNGS EVERY 6 HOURS AS NEEDED FOR WHEEZING OR SHORTNESS OF BREATH.  . warfarin (COUMADIN) 5 MG tablet TAKE AS DIRECTED BY ANTICOAGULATION CLINIC  . cefdinir (OMNICEF) 300 MG capsule Take 1 capsule (300 mg total) by mouth 2 (two) times daily. (Patient not taking: Reported on 11/21/2016)  . levofloxacin (LEVAQUIN) 500 MG tablet Take 1 tablet (500 mg total) by mouth daily.  [name] loratadine (CLARITIN) 10 MG tablet Take 10 mg by mouth daily as needed for allergies.   [name] Respiratory Therapy Supplies (FLUTTER) DEVI As directed (Patient not taking: Reported on 11/21/2016)   No facility-administered encounter medications on file as of 11/21/2016.      Review of Systems  Constitutional:   No  weight loss, night sweats,  Fevers, chills,  +fatigue, or  lassitude.  HEENT:   No headaches,  Difficulty swallowing,  Tooth/dental problems, or  Sore throat,                No sneezing, itching, ear ache, nasal congestion, post nasal drip,   CV:  No chest pain,  Orthopnea, PND, swelling in lower extremities, anasarca, dizziness, palpitations, syncope.   GI  No heartburn, indigestion, abdominal pain, nausea, vomiting, diarrhea, change in bowel habits, loss of appetite, bloody stools.   Resp:    No wheezing.  No chest wall deformity  Skin: no rash or lesions.  GU: no dysuria, change in color of urine, no urgency or frequency.  No flank pain, no hematuria   MS:  No joint pain or swelling.  No decreased range of motion.  No back pain.    Physical Exam  BP (!) 150/76 (BP Location: Right Arm, Patient Position: Sitting, Cuff Size: Normal)   Pulse 82   Ht 5\' 5"  (1.651 m)   Wt 149 lb (67.6 kg)   SpO2 92%   BMI 24.79 kg/m   GEN: A/Ox3; pleasant , NAD,  elderly    HEENT:  [place]/AT,  EACs-clear, TMs-wnl, NOSE-clear, THROAT-clear, no lesions, no postnasal drip or exudate noted.   NECK:  Supple w/ fair ROM; no JVD; normal carotid impulses w/o bruits; no thyromegaly or nodules palpated; no lymphadenopathy.    RESP  Clear  P & A; w/o, wheezes/ rales/ or rhonchi. no accessory muscle use, no dullness to percussion  CARD:  RRR, no m/r/g, tr peripheral edema, pulses intact, no cyanosis or clubbing.  GI:   Soft & nt; nml bowel sounds; no organomegaly or masses detected.   Musco: Warm bil, no deformities or joint swelling noted.   Neuro: alert, no focal deficits noted.    Skin: Warm, no lesions or rashes  Lab Results:  CBC    Component Value Date/Time   WBC 9.1 08/02/2016 1159   RBC 4.64 08/02/2016 1159   HGB 14.6 08/02/2016 1159   HCT 43.4 08/02/2016 1159   PLT 285.0 08/02/2016 1159   MCV 93.6 08/02/2016 1159   MCH 34.6 (H) 10/25/2015 1252   MCHC 33.7 08/02/2016 1159   RDW 13.7 08/02/2016 1159   LYMPHSABS 2.3 08/02/2016 1159   MONOABS 0.8 08/02/2016 1159   EOSABS 0.1 08/02/2016 1159   BASOSABS 0.0 08/02/2016 1159    BMET    Component Value Date/Time   NA 134 (L) 08/02/2016 1159   K 4.4 08/02/2016 1159   CL 98 08/02/2016 1159   CO2 30 08/02/2016 1159   GLUCOSE 99 08/02/2016 1159   BUN 11 08/02/2016 1159   CREATININE 0.57 08/02/2016 1159   CALCIUM 10.0 08/02/2016 1159   GFRNONAA >60 10/25/2015 1252   GFRAA >60 10/25/2015 1252    BNP No results found for: BNP  ProBNP No results found for: PROBNP  Imaging: Dg Chest 2 View  Result Date: 11/21/2016 CLINICAL DATA:  Follow-up pneumonia. EXAM: CHEST  2 VIEW COMPARISON:  11/07/2016.  10/25/2015.  02/06/2014 .  CT 10/25/2015. FINDINGS: Mediastinum and hilar structures are stable. Stable cardiomegaly. Prominent central pulmonary vessels. Pulmonary hypertension cannot be excluded. No change from prior exams. No persistent right middle lobe pleural-parenchymal thickening noted  most consistent with scarring . Superimposed active infiltrate cannot be excluded. No pleural effusion or pneumothorax pre COPD. Degenerative changes and osteopenia thoracic spine. IMPRESSION: 1. Right middle lobe pleuroparenchymal thickening again noted most consistent with scarring. Superimposed active infiltrate cannot be excluded . 2. COPD. Cardiomegaly with prominent central pulmonary arteries suggesting pulmonary hypertension. Electronically Signed   By: Marcello Moores  Register   On: 11/21/2016 11:19   Dg Chest 2 View  Result Date: 11/07/2016 CLINICAL DATA:  Short of breath EXAM: CHEST  2 VIEW COMPARISON:  Chest two-view 10/25/2015, CT 10/25/2015 FINDINGS: COPD with pulmonary hyperinflation. New area of infiltrate in the right upper lobe anteriorly just above the minor fissure. No pleural effusion. Left lung clear. IMPRESSION: Right upper lobe infiltrate, probable pneumonia Followup PA and lateral chest X-ray is recommended in 3-4 weeks following trial of antibiotic therapy to ensure resolution and exclude underlying malignancy. Electronically Signed   By: Franchot Gallo M.D.   On: 11/07/2016 15:18   Reviewed independently 11/21/2016 agree with finding.  Assessment & Plan:   Bronchiectasis Slow to resolve flare with PNA   Plan  . Patient Instructions  Levaquin 500mg   For 7 days . , take w/ food  Mucinex DM Twice daily  As needed  Cough/congestion .  Fluids and rest  Notify Coumadin clinic that you have started an antibiotic.  Follow up with  Dr. [name]  In 3 weeks with chest xray and As needed   Please contact office for sooner follow up if symptoms do not improve or worsen or seek emergency care      RML pneumonia Slow to resolve w/ persistent sx   Plan  Patient Instructions  Levaquin 500mg   For 7 days . , take w/ food  Mucinex DM Twice daily  As needed  Cough/congestion .  Fluids and rest  Notify Coumadin clinic that you have started an antibiotic.  Follow up with  Dr. [name]  In 3  weeks with chest xray and As needed   Please contact office for sooner follow up if symptoms do not improve or worsen or seek emergency  care         Rexene Edison, NP 11/21/2016

## 2016-11-21 NOTE — Progress Notes (Signed)
I agree with this plan.

## 2016-11-21 NOTE — Assessment & Plan Note (Signed)
Slow to resolve flare with PNA   Plan  . Patient Instructions  Levaquin 500mg   For 7 days . , take w/ food  Mucinex DM Twice daily  As needed  Cough/congestion .  Fluids and rest  Notify Coumadin clinic that you have started an antibiotic.  Follow up with  Dr. [name]  In 3 weeks with chest xray and As needed   Please contact office for sooner follow up if symptoms do not improve or worsen or seek emergency care

## 2016-11-21 NOTE — Patient Instructions (Signed)
Pre visit review using our clinic review tool, if applicable. No additional management support is needed unless otherwise documented below in the visit note. 

## 2016-12-03 ENCOUNTER — Ambulatory Visit: Payer: Medicare Other

## 2016-12-05 ENCOUNTER — Ambulatory Visit (INDEPENDENT_AMBULATORY_CARE_PROVIDER_SITE_OTHER): Payer: Medicare Other

## 2016-12-05 DIAGNOSIS — Z5181 Encounter for therapeutic drug level monitoring: Secondary | ICD-10-CM

## 2016-12-05 LAB — POCT INR: INR: 1.5

## 2016-12-05 NOTE — Progress Notes (Signed)
I agree with this plan.

## 2016-12-05 NOTE — Patient Instructions (Addendum)
Pre visit review using our clinic review tool, if applicable. No additional management support is needed unless otherwise documented below in the visit note.  INR today 1.5  Patient recently treated with Levaquin due to pneumonia and decreased coumadin dosing as a result.  Patient finished antibiotics earlier in the week and is better but still presenting with fatigue.  She has resumed her prior dosing schedule and denies any other changes to her diet, medications or health.  Also, denies skipping any doses.  Instructions given to have patient take 1 whole pill (5mg ) today and tomorrow 2/28, 3/1, and then resume dosing of 1/2 pill (2.5mg ) daily EXCEPT for 1 whole (5mg ) pill on Sundays.  Recheck in 1-2 weeks.  She will also limit greens consumption in upcoming days.  Patient verbalizes understanding of subtherapuetic risks and all instructions given today.  She will go to ER if any concerns develop.

## 2016-12-17 ENCOUNTER — Ambulatory Visit: Payer: Medicare Other

## 2016-12-17 ENCOUNTER — Ambulatory Visit: Payer: Medicare Other | Admitting: Emergency Medicine

## 2016-12-19 ENCOUNTER — Ambulatory Visit (INDEPENDENT_AMBULATORY_CARE_PROVIDER_SITE_OTHER)
Admission: RE | Admit: 2016-12-19 | Discharge: 2016-12-19 | Disposition: A | Discharge status: Home / Self Care | Payer: Medicare Other | Source: Ambulatory Visit | Attending: Emergency Medicine | Admitting: Emergency Medicine

## 2016-12-19 ENCOUNTER — Ambulatory Visit (INDEPENDENT_AMBULATORY_CARE_PROVIDER_SITE_OTHER): Payer: Medicare Other | Admitting: General Practice

## 2016-12-19 ENCOUNTER — Encounter: Payer: Self-pay | Admitting: Emergency Medicine

## 2016-12-19 ENCOUNTER — Ambulatory Visit (INDEPENDENT_AMBULATORY_CARE_PROVIDER_SITE_OTHER): Payer: Medicare Other | Admitting: Emergency Medicine

## 2016-12-19 VITALS — BP 118/80 | HR 83 | Ht 65.0 in | Wt 151.6 lb

## 2016-12-19 DIAGNOSIS — J471 Bronchiectasis with (acute) exacerbation: Secondary | ICD-10-CM | POA: Diagnosis not present

## 2016-12-19 DIAGNOSIS — J449 Chronic obstructive pulmonary disease, unspecified: Secondary | ICD-10-CM

## 2016-12-19 DIAGNOSIS — J309 Allergic rhinitis, unspecified: Secondary | ICD-10-CM

## 2016-12-19 DIAGNOSIS — R05 Cough: Secondary | ICD-10-CM | POA: Diagnosis not present

## 2016-12-19 DIAGNOSIS — R0609 Other forms of dyspnea: Secondary | ICD-10-CM

## 2016-12-19 DIAGNOSIS — Z5181 Encounter for therapeutic drug level monitoring: Secondary | ICD-10-CM

## 2016-12-19 LAB — POCT INR: INR: 2.2

## 2016-12-19 MED ORDER — TIOTROPIUM BROMIDE MONOHYDRATE 1.25 MCG/ACT IN AERS: 2.0000 | INHALATION_SPRAY | Freq: Every day | RESPIRATORY_TRACT | 0 refills | Status: DC

## 2016-12-19 NOTE — Progress Notes (Signed)
I agree with this plan.

## 2016-12-19 NOTE — Patient Instructions (Signed)
Pre visit review using our clinic review tool, if applicable. No additional management support is needed unless otherwise documented below in the visit note. 

## 2016-12-19 NOTE — Patient Instructions (Addendum)
Please continue your Rhinocort, Singulair, loratadine.  Mucinex as needed You can use DuoNeb as needed for secretion clearance We will try using Spiriva Respimat 2 puffs once a day to see if you benefit. If so then let us know and we will order this through your pharmacy.  We will discuss a possible repeat CT scan of your chest in the future. We do not need to schedule right now.  Follow with Dr [name] in 4 months or sooner if you have any problems.

## 2016-12-19 NOTE — Assessment & Plan Note (Signed)
Please continue your Rhinocort, Singulair, loratadine.

## 2016-12-19 NOTE — Assessment & Plan Note (Signed)
Related to her bronchiectasis. Will do a trial spiriva (she has tried Symbicort before) to see if she benefits.   We will try using Spiriva Respimat 2 puffs once a day to see if you benefit. If so then let us know and we will order this through your pharmacy.  Follow with Dr [name] in 4 months or sooner if you have any problems.

## 2016-12-19 NOTE — Addendum Note (Signed)
Addended by: Valerie Salts on: 12/19/2016 03:19 PM   Modules accepted: Orders

## 2016-12-19 NOTE — Progress Notes (Signed)
Patient seen in the office today and instructed on use of Spiriva 1.25.  Patient expressed understanding and demonstrated technique. [name] [facility] 12/19/16

## 2016-12-19 NOTE — Assessment & Plan Note (Signed)
Her chest x-ray shows chronic right middle lobe changes. She is clinically improved. I don't believe we need to treat with any more antibiotics at this time. We will need to repeat her CT scan of the chest to follow her bronchiectasis but there is no indication to do this urgently. We will discuss at a future visit  Mucinex as needed You can use DuoNeb as needed for secretion clearance We will discuss a possible repeat CT scan of your chest in the future. We do not need to schedule right now.  Follow with Dr [name] in 4 months or sooner if you have any problems.

## 2016-12-19 NOTE — Progress Notes (Signed)
Subjective:    Patient ID: [name], female    DOB: 07/02/1930   MRN: [id number]  HPI 18  yowf never smoker, allergic rhinitis, hx bilat PE in September 2008, with asociated Pulm HTN (PASP 70 with intact LV fxn). Moderately Severe obstruction on pulmonary function testing from 2009 With hypoxemia . Also with a history of GERD and allergic rhinitis That contribute chronic cough.  She has bronchiectasis noted On CT scan of the chest with some associated micronodular disease particularly in the right middle lobe and lower lobes bilaterally. She experiences frequent episodes of bronchitis and acute exacerbations of her bronchiectasis that require antibiotic treatment. Her last appears to have been in January 2017 when she was seen in this office.    She is using rhinocort, taking loratadine every qhs. She has albuterol that she uses prn, no other BD's at this time. She uses a flutter valve every other day. She is using o2 at 1l/min with housework, chores. Using mucinex as needed. She feels that she is doing fairly well.  She has some L ear fullness and pressure. She has throat clearing. She has every day cough - yellow to green .   ROV 06/13/16 -- Patient follows up today for history of Bronchiectasis with associated moderately severe obstructive lung disease, question mycobacterial colonization. Also with allergic rhinitis currently managed on Rhinocort and loratadine. At our last visit we tried starting Symbicort twice a day to see if she would benefit. She seemed to get a lot of rhinitis, cough. Also had some anxiousness and heart racing. She stopped it after 2 days. She is doing well currently. She has nebulized albuterol available to use prn, not needing currently. She uses O2 prn. Reliable with rhinocort and loratadine.   ROV 09/19/16 -- This is a follow-up visit for history of COPD, moderately severe obstructive lung disease in the setting of bronchiectasis and possible mycobacterial  colonization. She also has hx remote PE, PAH, allergic rhinitis. She tells me that she is feeling a bit less energetic. She is independent. Her breathing has been good - does get congestion, some cough. Remains on rhinocort, loratadine, singulair. She was treated with pred + abx for bronchitis last month > helped her. Uses duoneb about once a day.   ROV 12/19/16 -- this follow-up visit for patient with bronchiectasis and associated moderately severe obstructive lung disease. She has suspected mycobacterial colonization (unproven). She also has a history of pulmonary embolism with associated pulmonary arterial hypertension. She was seen twice in our office since late January, treated for a suspected RUL CAP based on sx and CXR. Received omnicef and then levaquin.  Her culture data shows that she has a history of pseudomonas intermediate to cipro from 05/17/16.  She reports today that she is doing better, still has cough but more clear mucous. She is using DuoNeb once a day to help clear mucous. She has been on Symbicort in the past - did not tolerate. Her exercise tolerance is OK. Uses O2 at 2L/min with some exertion.   Rhinitis regimen > Rhinocort, Singulair, loratadine.      Objective:   Physical Exam Vitals:   12/19/16 1440  BP: 118/80  Pulse: 83  SpO2: 93%  Weight: 151 lb 9.6 oz (68.8 kg)  Height: 5\' 5"  (1.651 m)   Gen: Pleasant, well-nourished, in no distress,  normal affect  ENT: No lesions,  mouth clear,  oropharynx clear, no postnasal drip  Neck: No JVD, no TMG, no carotid bruits  Lungs: No use of accessory muscles, Focal expiratory wheezing in the right midlung zone. Left is clear  Cardiovascular: RRR, heart sounds normal, no murmur or gallops, no peripheral edema  Musculoskeletal: No deformities, no cyanosis or clubbing  Neuro: alert, non focal  Skin: Warm, no lesions or rashes    10/25/15 --  COMPARISON:  December 11, 2011 CT scan  FINDINGS: The trachea and mainstem bronchi  are normal and unchanged. Bronchiectasis is identified in the right middle and lower lobes. Multiple nodules are seen as well, particularly in the right middle lobe, lingula, and right lower lobe. Tree in bud opacities are identified as well. There are opacified airway is in the right middle lobe and right lower lobe, most likely due to inspissated mucus secondary to bronchiectasis. These findings have increased in the interval. There is also opacity in the right middle lobe medially consistent with atelectasis. No suspicious masses or acute infiltrates. Mixed attenuation of the lungs with alternating areas of increased and decreased attenuation could represent air trapping, especially in the light of obvious airways disease. The patient however does have a history of previous pulmonary emboli which could lead to a similar appearance.  Atherosclerotic changes seen in the thoracic aorta. No aneurysm or dissection. There is a small hiatal hernia. Coronary artery calcifications. The heart is unchanged. A linear density projected in a right pulmonary artery near the hilum is consistent with sequela of previous pulmonary emboli. No acute emboli identified on today's study. No acute pulmonary emboli identified on today's study.  Evaluation of the upper abdomen is limited but unremarkable.  No acute bony abnormalities.  Review of the MIP images confirms the above findings.  IMPRESSION: 1. No acute pulmonary emboli. 2. The nodularity and bronchiectasis in the lungs is consistent with chronic MAI infection.       Assessment & Plan:  COPD (chronic obstructive pulmonary disease) ([place]) Related to her bronchiectasis. Will do a trial spiriva (she has tried Symbicort before) to see if she benefits.   We will try using Spiriva Respimat 2 puffs once a day to see if you benefit. If so then let us know and we will order this through your pharmacy.  Follow with Dr [name] in 4 months or  sooner if you have any problems.  Bronchiectasis Her chest x-ray shows chronic right middle lobe changes. She is clinically improved. I don't believe we need to treat with any more antibiotics at this time. We will need to repeat her CT scan of the chest to follow her bronchiectasis but there is no indication to do this urgently. We will discuss at a future visit  Mucinex as needed You can use DuoNeb as needed for secretion clearance We will discuss a possible repeat CT scan of your chest in the future. We do not need to schedule right now.  Follow with Dr [name] in 4 months or sooner if you have any problems.  Allergic rhinitis Please continue your Rhinocort, Singulair, loratadine.   Baltazar Apo, MD, PhD 12/19/2016, 3:17 PM [facility] [phone] or if no answer [phone]

## 2016-12-24 ENCOUNTER — Ambulatory Visit: Payer: Medicare Other

## 2017-01-01 ENCOUNTER — Telehealth: Payer: Self-pay | Admitting: Emergency Medicine

## 2017-01-01 MED ORDER — TIOTROPIUM BROMIDE MONOHYDRATE 1.25 MCG/ACT IN AERS: 2.0000 | INHALATION_SPRAY | Freq: Every day | RESPIRATORY_TRACT | 5 refills | Status: DC

## 2017-01-01 NOTE — Telephone Encounter (Signed)
Spoke with pt, states sample of Spiriva respimat is working well and requests a refill to be sent to pharmacy.  This has been sent.  Nothing further needed.

## 2017-01-02 ENCOUNTER — Ambulatory Visit: Payer: Medicare Other | Admitting: Emergency Medicine

## 2017-01-14 ENCOUNTER — Telehealth: Payer: Self-pay | Admitting: Emergency Medicine

## 2017-01-14 NOTE — Telephone Encounter (Signed)
Spoke with pt, who is requesting CXR results. Pt states she is concerned about an infection, due to Dr. Layne Benton planning to do injections for osteoporosis. Pt states an infection can not be present with this injection, as it can make her very sick.  RB please advise. Thanks.

## 2017-01-15 ENCOUNTER — Other Ambulatory Visit: Payer: Self-pay | Admitting: Internal Medicine

## 2017-01-15 NOTE — Telephone Encounter (Signed)
Please let her know that there is no evidence of active infection on her chest x-ray from 12/19/16. She does have some stable right middle lobe scarring that we have known about. I don't see anything that would prevent her from getting her injection as planned.

## 2017-01-15 NOTE — Telephone Encounter (Signed)
Spoke with pt and informed her of RB's message pt, understood and had no further questions. Nothing further is needed

## 2017-01-16 ENCOUNTER — Encounter (HOSPITAL_COMMUNITY): Payer: Self-pay | Admitting: *Deleted

## 2017-01-16 ENCOUNTER — Ambulatory Visit: Payer: Medicare Other

## 2017-01-16 ENCOUNTER — Emergency Department (HOSPITAL_COMMUNITY): Payer: Medicare Other

## 2017-01-16 ENCOUNTER — Emergency Department (HOSPITAL_COMMUNITY)
Admission: EM | Admit: 2017-01-16 | Discharge: 2017-01-16 | Disposition: A | Discharge status: Home / Self Care | Payer: Medicare Other | Attending: Emergency Medicine | Admitting: Emergency Medicine

## 2017-01-16 DIAGNOSIS — R072 Precordial pain: Secondary | ICD-10-CM | POA: Insufficient documentation

## 2017-01-16 DIAGNOSIS — I10 Essential (primary) hypertension: Secondary | ICD-10-CM | POA: Insufficient documentation

## 2017-01-16 DIAGNOSIS — J449 Chronic obstructive pulmonary disease, unspecified: Secondary | ICD-10-CM | POA: Insufficient documentation

## 2017-01-16 DIAGNOSIS — Z7901 Long term (current) use of anticoagulants: Secondary | ICD-10-CM | POA: Insufficient documentation

## 2017-01-16 DIAGNOSIS — Z85038 Personal history of other malignant neoplasm of large intestine: Secondary | ICD-10-CM | POA: Insufficient documentation

## 2017-01-16 DIAGNOSIS — I2510 Atherosclerotic heart disease of native coronary artery without angina pectoris: Secondary | ICD-10-CM | POA: Diagnosis not present

## 2017-01-16 DIAGNOSIS — Z8673 Personal history of transient ischemic attack (TIA), and cerebral infarction without residual deficits: Secondary | ICD-10-CM | POA: Diagnosis not present

## 2017-01-16 DIAGNOSIS — R791 Abnormal coagulation profile: Secondary | ICD-10-CM | POA: Insufficient documentation

## 2017-01-16 DIAGNOSIS — R079 Chest pain, unspecified: Secondary | ICD-10-CM | POA: Diagnosis present

## 2017-01-16 DIAGNOSIS — Z79899 Other long term (current) drug therapy: Secondary | ICD-10-CM | POA: Insufficient documentation

## 2017-01-16 LAB — PROTIME-INR
INR: 1.81
Prothrombin Time: 21.2 seconds — ABNORMAL HIGH (ref 11.4–15.2)

## 2017-01-16 LAB — BASIC METABOLIC PANEL
ANION GAP: 7 (ref 5–15)
BUN: 16 mg/dL (ref 6–20)
CALCIUM: 9 mg/dL (ref 8.9–10.3)
CO2: 27 mmol/L (ref 22–32)
Chloride: 103 mmol/L (ref 101–111)
Creatinine, Ser: 0.66 mg/dL (ref 0.44–1.00)
GFR calc Af Amer: >60 mL/min (ref >60)
Glucose, Bld: 108 mg/dL — ABNORMAL HIGH (ref 65–99)
Potassium: 5.4 mmol/L — ABNORMAL HIGH (ref 3.5–5.1)
Sodium: 137 mmol/L (ref 135–145)

## 2017-01-16 LAB — CBC
HCT: 38.9 % (ref 36.0–46.0)
HEMOGLOBIN: 13.6 g/dL (ref 12.0–15.0)
MCH: 34.3 pg — ABNORMAL HIGH (ref 26.0–34.0)
MCHC: 35 g/dL (ref 30.0–36.0)
MCV: 98 fL (ref 78.0–100.0)
Platelets: 243 10*3/uL (ref 150–400)
RBC: 3.97 MIL/uL (ref 3.87–5.11)
RDW: 14.3 % (ref 11.5–15.5)
WBC: 8 10*3/uL (ref 4.0–10.5)

## 2017-01-16 LAB — I-STAT TROPONIN, ED: TROPONIN I, POC: 0 ng/mL (ref 0.00–0.08)

## 2017-01-16 LAB — POTASSIUM: POTASSIUM: 4.1 mmol/L (ref 3.5–5.1)

## 2017-01-16 MED ORDER — IOPAMIDOL (ISOVUE-370) INJECTION 76%
100.0000 mL | Freq: Once | INTRAVENOUS | Status: AC | PRN
  Administered 2017-01-16: 73 mL via INTRAVENOUS

## 2017-01-16 MED ORDER — IOPAMIDOL (ISOVUE-370) INJECTION 76%
INTRAVENOUS | Status: AC
  Filled 2017-01-16: qty 100

## 2017-01-16 NOTE — Discharge Instructions (Signed)
Please take double of your dose of coumadin today and then switch back to your normal schedule. Please follow up with your primary care doctor and also cardiology.

## 2017-01-16 NOTE — ED Triage Notes (Signed)
Pt complains of right sided chest pain that woke her up at [facility] today. Pt states the pain radiates to her right upper back. Pt states breathing is painful. Pt states she felt fatigued yesterday.

## 2017-01-16 NOTE — ED Notes (Signed)
Patient is A & O x4.  She understood AVS instructions.  

## 2017-01-16 NOTE — ED Provider Notes (Signed)
[place] DEPT Provider Note   CSN: [id number] Arrival date & time: 01/16/17  1118     History   Chief Complaint Chief Complaint  Patient presents with  . Chest Pain    HPI [name] is a 83 y.o. female.  [name] is a 83 y.o. Female with a history of pulmonary hypertension, PEs on Coumadin, HTN, and HLD, who presents to the ED with her daughter complaining of right sided chest pain since 4 am this morning. Patient reports she woke around 4 AM this morning with complaint of right-sided chest pain. She reports she has pain with deep inspiration. She denies feeling short of breath. Patient reports her pain is right-sided and radiates to her right upper back. She reports an increased cough over the past several months. No recent acute changes to her coughing. She denies hemoptysis. She does have a history of PE and is on Coumadin. She's been compliant with her medications. Patient is on oxygen and to use at home as needed. She denies recent long travel or recent surgeries. She denies fevers, hemoptysis, leg pain, leg swelling, shortness of breath, abdominal pain, nausea, vomiting, rashes, lightheadedness, dizziness or syncope.   The history is provided by the patient, medical records and a relative. No language interpreter was used.  Chest Pain   Associated symptoms include back pain. Pertinent negatives include no abdominal pain, no cough, no fever, no headaches, no nausea, no palpitations, no shortness of breath and no vomiting.    Past Medical History:  Diagnosis Date  . ALLERGIC RHINITIS   . ANXIETY   . Bronchiectasis 12/11/2011   CT chest dx  . CAROTID ARTERY STENOSIS, RIGHT   . Chronic rhinitis   . COMMON MIGRAINE   . Complication of anesthesia    dizzy  . COPD   . CORONARY ARTERY DISEASE   . CYST/PSEUDOCYST, PANCREAS   . DEPRESSION   . DIVERTICULOSIS, COLON   . DIZZINESS, CHRONIC   . Esophageal stricture   . GERD   . HYPERLIPIDEMIA   .  HYPERTENSION   . OSTEOPOROSIS   . OVERACTIVE BLADDER   . Pulmonary embolism ([place]) 2008  . PULMONARY HYPERTENSION, SECONDARY   . Stroke ([place])   . Tubular adenoma of colon 10/2013  . Unspecified hearing loss     Patient Active Problem List   Diagnosis Date Noted  . General weakness 08/02/2016  . Urine frequency 11/20/2014  . Encounter for therapeutic drug monitoring 12/16/2013  . Nonspecific abnormal finding in stool contents 10/19/2013  . Benign neoplasm of colon 10/19/2013  . Urinary frequency 08/28/2013  . Hematochezia 05/26/2013  . Chest pain 11/10/2012  . Vertigo 05/28/2012  . Trapezius strain 01/10/2012  . Chronic respiratory failure ([place]) 12/21/2011  . Bronchiectasis ([place]) 12/11/2011  . RML pneumonia ([place]) 11/29/2011  . Fatigue 11/29/2011  . Acute bronchitis 10/11/2011  . Preventative health care 09/22/2011  . Impaired glucose tolerance 09/21/2011  . Internal hemorrhoids without complication 78/24/2353  . Chest pain 05/21/2011  . Palpitations 05/21/2011  . Long term (current) use of anticoagulants 01/03/2011  . Pulmonary embolism ([place]) 12/08/2010  . FLATULENCE-GAS-BLOATING 07/03/2010  . ABNORMAL FINDINGS GI TRACT 07/03/2010  . WEIGHT LOSS-ABNORMAL 06/06/2010  . NAUSEA 06/06/2010  . ABDOMINAL PAIN-PERIUMBILICAL 61/44/3154  . ABDOMINAL PAIN-EPIGASTRIC 06/06/2010  . ABNORMAL EXAM-BILIARY TRACT 06/06/2010  . DIZZINESS, CHRONIC 10/14/2009  . OVERACTIVE BLADDER 05/26/2009  . CORONARY ARTERY DISEASE 04/06/2009  . COLONIC POLYPS, HX OF 04/06/2009  . Chronic rhinitis 11/22/2008  .  Dysuria 05/27/2008  . HEMATOCHEZIA 05/07/2008  . COPD (chronic obstructive pulmonary disease) ([place]) 03/31/2008  . PULMONARY HYPERTENSION, SECONDARY 01/15/2008  . Dyspnea 12/30/2007  . GAIT IMBALANCE 11/26/2007  . Unspecified hearing loss 10/17/2007  . HYPERLIPIDEMIA 08/05/2007  . ANXIETY 08/05/2007  . DEPRESSION 08/05/2007  . COMMON MIGRAINE 08/05/2007  . CAROTID ARTERY STENOSIS, RIGHT  08/05/2007  . Allergic rhinitis 08/05/2007  . DIVERTICULOSIS, COLON 08/05/2007  . CYST/PSEUDOCYST, PANCREAS 08/05/2007  . OSTEOARTHRITIS, KNEE, RIGHT 08/05/2007  . Personal History of Other Diseases of Digestive Disease 08/05/2007  . Essential hypertension 03/11/2007  . GERD 03/11/2007  . OSTEOPOROSIS 03/11/2007  . TRANSIENT ISCHEMIC ATTACK, HX OF 03/11/2007    Past Surgical History:  Procedure Laterality Date  . ABDOMINAL HYSTERECTOMY  1965  . APPENDECTOMY  1963  . CHOLECYSTECTOMY  1963  . COLONOSCOPY N/A 10/19/2013   Procedure: COLONOSCOPY;  Surgeon: Ladene Artist, MD;  Location: [facility];  Service: Endoscopy;  Laterality: N/A;  . EGD  1999  . PANCREATIC CYST DRAINAGE     x 2  . s/p sinus surgury    . TONSILLECTOMY      OB History    No data available       Home Medications    Prior to Admission medications   Medication Sig Start Date End Date Taking? Authorizing Provider  amLODipine (NORVASC) 5 MG tablet TAKE 1 TABLET EVERY DAY Patient taking differently: TAKE 5 MG BY MOUTH EVERY DAY 09/25/16  Yes [name], MD  budesonide (RHINOCORT ALLERGY) 32 MCG/ACT nasal spray Place 1 spray into both nostrils daily as needed for rhinitis or allergies.    Yes Historical Provider, MD  Digestive Enzymes (PAPAYA AND ENZYMES) CHEW Chew 2 tablets by mouth 3 (three) times daily with meals as needed (digestion of big meals).    Yes Historical Provider, MD  famotidine (PEPCID) 40 MG tablet Take 1 tablet (40 mg total) by mouth daily. Patient taking differently: Take 20 mg by mouth 2 (two) times daily.  08/02/16  Yes [name], MD  loratadine (CLARITIN) 10 MG tablet Take 10 mg by mouth daily.    Yes Historical Provider, MD  losartan (COZAAR) 100 MG tablet TAKE 1 TABLET EVERY DAY 09/25/16  Yes [name], MD  Multiple Vitamins-Minerals [facility]) TABS Take 1 tablet by mouth daily.     Yes Historical Provider, MD  Probiotic Product (PROBIOTIC DAILY PO) Take 1 tablet by mouth  daily.   Yes Historical Provider, MD  Respiratory Therapy Supplies (FLUTTER) DEVI As directed 11/02/15  Yes Marijean Heath, NP  Tiotropium Bromide Monohydrate (SPIRIVA RESPIMAT) 1.25 MCG/ACT AERS Inhale 2 puffs into the lungs daily. 01/01/17  Yes Collene Gobble, MD  VENTOLIN HFA 108 (90 Base) MCG/ACT inhaler INHALE 2 PUFFS INTO THE LUNGS EVERY 6 HOURS AS NEEDED FOR WHEEZING OR SHORTNESS OF BREATH. 09/25/16  Yes [name], MD  warfarin (COUMADIN) 5 MG tablet TAKE AS DIRECTED BY ANTICOAGULATION CLINIC Patient taking differently: Take 5 mg by mouth on sunday and 2.5 mg all other days 01/15/17  Yes [name], MD  ipratropium-albuterol (DUONEB) 0.5-2.5 (3) MG/3ML SOLN Take 3 mLs by nebulization every 6 (six) hours as needed. Patient not taking: Reported on 01/16/2017 07/29/15   [name], MD  meclizine (ANTIVERT) 12.5 MG tablet Take 1 tablet (12.5 mg total) by mouth 3 (three) times daily as needed for dizziness. Patient not taking: Reported on 01/16/2017 07/01/14   [name], MD  montelukast (  SINGULAIR) 10 MG tablet Take 1 tablet (10 mg total) by mouth daily. Patient not taking: Reported on 01/16/2017 08/02/16   [name], MD    Family History Family History  Problem Relation Age of Onset  . Heart disease Mother   . Emphysema Father     was a smoker  . Stroke Father   . Breast cancer Daughter   . Lymphoma Sister     Social History Social History  Substance Use Topics  . Smoking status: Never Smoker  . Smokeless tobacco: Never Used     Comment: spouse smoked in the home for 15 years  . Alcohol use No     Allergies   Amoxicillin-pot clavulanate; Ciclesonide; Ciprofloxacin; Raloxifene; and Sulfonamide derivatives   Review of Systems Review of Systems  Constitutional: Negative for chills and fever.  HENT: Negative for congestion and sore throat.   Eyes: Negative for visual disturbance.  Respiratory: Negative for cough, shortness of breath and wheezing.     Cardiovascular: Positive for chest pain. Negative for palpitations and leg swelling.  Gastrointestinal: Negative for abdominal pain, diarrhea, nausea and vomiting.  Genitourinary: Negative for dysuria.  Musculoskeletal: Positive for back pain. Negative for neck pain.  Skin: Negative for rash.  Neurological: Negative for syncope, light-headedness and headaches.     Physical Exam Updated Vital Signs BP (!) 124/58 (BP Location: Right Arm)   Pulse 70   Temp 98.2 F (36.8 C) (Oral)   Resp 19   SpO2 93%   Physical Exam  Constitutional: She is oriented to person, place, and time. She appears well-developed and well-nourished. No distress.  Nontoxic appearing.  HENT:  Head: Normocephalic and atraumatic.  Right Ear: External ear normal.  Left Ear: External ear normal.  Mouth/Throat: Oropharynx is clear and moist.  Eyes: Conjunctivae are normal. Pupils are equal, round, and reactive to light. Right eye exhibits no discharge. Left eye exhibits no discharge.  Neck: Neck supple. No JVD present.  Cardiovascular: Normal rate, regular rhythm, normal heart sounds and intact distal pulses.  Exam reveals no gallop and no friction rub.   No murmur heard. Bilateral radial, posterior tibialis pulses are intact.    Pulmonary/Chest: Effort normal and breath sounds normal. No stridor. No respiratory distress. She has no wheezes. She has no rales. She exhibits no tenderness.  Lungs are clear to ascultation bilaterally. Symmetric chest expansion bilaterally. No increased work of breathing. No rales or rhonchi.    Abdominal: Soft. There is no tenderness. There is no guarding.  Musculoskeletal: She exhibits no edema or tenderness.  No LE edema or TTP.   Lymphadenopathy:    She has no cervical adenopathy.  Neurological: She is alert and oriented to person, place, and time. No sensory deficit. Coordination normal.  Skin: Skin is warm and dry. Capillary refill takes less than 2 seconds. No rash noted. She  is not diaphoretic. No erythema. No pallor.  Psychiatric: She has a normal mood and affect. Her behavior is normal.  Nursing note and vitals reviewed.    ED Treatments / Results  Labs (all labs ordered are listed, but only abnormal results are displayed) Labs Reviewed  BASIC METABOLIC PANEL - Abnormal; Notable for the following:       Result Value   Potassium 5.4 (*)    Glucose, Bld 108 (*)    All other components within normal limits  CBC - Abnormal; Notable for the following:    MCH 34.3 (*)    All other components within  normal limits  PROTIME-INR - Abnormal; Notable for the following:    Prothrombin Time 21.2 (*)    All other components within normal limits  POTASSIUM  I-STAT TROPOININ, ED    EKG  EKG Interpretation  Date/Time:  Wednesday January 16 2017 11:26:25 EDT Ventricular Rate:  84 PR Interval:    QRS Duration: 68 QT Interval:  352 QTC Calculation: 416 R Axis:   76 Text Interpretation:  Sinus arrhythmia RSR' in V1 or V2, probably normal variant No significant change since last tracing Confirmed by LITTLE MD, RACHEL [phone]) on 01/16/2017 1:29:41 PM       Radiology Dg Chest 2 View  Result Date: 01/16/2017 CLINICAL DATA:  Mid right-sided chest pain extending into the right upper extremity. EXAM: CHEST  2 VIEW COMPARISON:  Two-view chest x-ray a 12/19/2016. FINDINGS: Heart size normal. Pulmonary artery prominence is stable. Atherosclerotic calcifications are present at the aortic arch. There is no edema or effusion. Hyperinflation is again noted. The visualized soft tissues and bony thorax are unremarkable. IMPRESSION: 1. No acute cardiopulmonary disease or significant interval change. 2. Prominent pulmonary artery suggesting pulmonary arterial hypertension. 3. Stable hyperinflation. Electronically Signed   By: San Morelle M.D.   On: 01/16/2017 11:56   Ct Angio Chest Pe W/cm &/or Wo Cm  Result Date: 01/16/2017 CLINICAL DATA:  Right-sided chest pain and  dyspnea EXAM: CT ANGIOGRAPHY CHEST WITH CONTRAST TECHNIQUE: Multidetector CT imaging of the chest was performed using the standard protocol during bolus administration of intravenous contrast. Multiplanar CT image reconstructions and MIPs were obtained to evaluate the vascular anatomy. CONTRAST:  73 mL Isovue 370. COMPARISON:  10/25/2015, 01/16/2017 FINDINGS: Cardiovascular: Thoracic aorta shows diffuse calcifications without aneurysmal dilatation or dissection. Coronary calcifications are noted. No cardiac enlargement is seen. The pulmonary artery shows a normal branching pattern without evidence of filling defect to suggest pulmonary embolism. Mediastinum/Nodes: Thoracic inlet is within normal limits. No hilar or mediastinal adenopathy is identified. Lungs/Pleura: Chronic bronchiectasis is noted in the right middle lobe with some associated scarring and consolidation slightly progressed from the prior exam of 2017. Mild bronchiectatic changes are noted in the lateral aspect of the right lower lobe as well. The nodularity seen previously in the right middle lobe is obscured by the consolidation and scarring. Some right upper lobe nodularity is noted adjacent to the right middle lobe stable from the prior exam again likely related to chronic inflammatory change. No new focal nodules or infiltrates are seen. Upper Abdomen: No acute abnormality. Musculoskeletal: No acute bony abnormality is noted. Review of the MIP images confirms the above findings. IMPRESSION: Chronic postinfectious changes in the right middle lobe and to a lesser degree in the right upper and lower lobes. The middle lobe changes have progressed in the interval from the prior exam. Other changes are stable. No new focal abnormality is seen. No evidence of pulmonary emboli. Electronically Signed   By: Inez Catalina M.D.   On: 01/16/2017 14:53    Procedures Procedures (including critical care time)  Medications Ordered in ED Medications   iopamidol (ISOVUE-370) 76 % injection (not administered)  iopamidol (ISOVUE-370) 76 % injection 100 mL (73 mLs Intravenous Contrast Given 01/16/17 1420)     Initial Impression / Assessment and Plan / ED Course  I have reviewed the triage vital signs and the nursing notes.  Pertinent labs & imaging results that were available during my care of the patient were reviewed by me and considered in my medical decision making (see chart  for details).    This is a 83 y.o. Female with a history of pulmonary hypertension, PEs on Coumadin, HTN, and HLD, who presents to the ED with her daughter complaining of right sided chest pain since 4 am this morning. Patient reports she woke around 4 AM this morning with complaint of right-sided chest pain. She reports she has pain with deep inspiration. She denies feeling short of breath. Patient reports her pain is right-sided and radiates to her right upper back. She reports an increased cough over the past several months. No recent acute changes to her coughing. She denies hemoptysis. She does have a history of PE and is on Coumadin. On exam the patient is afebrile nontoxic appearing. Lungs are clear to ascultation bilaterally. No increased work of breathing. No lower extremity edema tenderness. No overlying skin changes to her chest. On and is not elevated. I see no need for delta troponin at this time is patient has been having ongoing pain for more than 8 hours of the time this was obtained. INR is subtherapeutic at 1.81. Will have her take double her dose today and recheck INR later this week by PCP. BMP was remarkable for potassium of 5.4. At recheck this is 4.1. Suspect this is related to the initial sample clotted. CBC shows no leukocytosis. Normal hemoglobin and hematocrit.  Chest x-ray shows no acute cardiopulmonary disease or significant interval change. CT angiogram of her chest to rule out PE was obtained due to this patient's history of PE, and chest pain.  CT angiographic no evidence of PE. It does show chronic postinfectious changes.  I have low suspicion for ACS at this time. No evidence of PE. Reevaluation patient is resting comfortably in the room. We will discharge at this time with strict and specific return precautions. I also encouraged her to follow-up with primary care and with cardiology for possible stress test. I advised the patient to follow-up with their primary care provider this week. I advised the patient to return to the emergency department with new or worsening symptoms or new concerns. The patient verbalized understanding and agreement with plan.    This patient was discussed with and evaluated by Dr. Rex Kras who agrees with assessment and plan.   Final Clinical Impressions(s) / ED Diagnoses   Final diagnoses:  Precordial pain  Subtherapeutic international normalized ratio (INR)    New Prescriptions New Prescriptions   No medications on file     Waynetta Pean, PA-C 01/16/17 [place], MD 01/24/17 1726

## 2017-01-23 ENCOUNTER — Ambulatory Visit (INDEPENDENT_AMBULATORY_CARE_PROVIDER_SITE_OTHER): Payer: Medicare Other | Admitting: General Practice

## 2017-01-23 DIAGNOSIS — Z5181 Encounter for therapeutic drug level monitoring: Secondary | ICD-10-CM

## 2017-01-23 LAB — POCT INR: INR: 3

## 2017-01-23 NOTE — Patient Instructions (Signed)
Pre visit review using our clinic review tool, if applicable. No additional management support is needed unless otherwise documented below in the visit note. 

## 2017-01-23 NOTE — Progress Notes (Signed)
I agree with this plan.

## 2017-01-29 ENCOUNTER — Encounter: Payer: Self-pay | Admitting: Internal Medicine

## 2017-01-29 ENCOUNTER — Ambulatory Visit (INDEPENDENT_AMBULATORY_CARE_PROVIDER_SITE_OTHER): Payer: Medicare Other | Admitting: Internal Medicine

## 2017-01-29 VITALS — BP 154/88 | HR 79 | Ht 65.0 in | Wt 153.0 lb

## 2017-01-29 DIAGNOSIS — J309 Allergic rhinitis, unspecified: Secondary | ICD-10-CM | POA: Diagnosis not present

## 2017-01-29 DIAGNOSIS — I10 Essential (primary) hypertension: Secondary | ICD-10-CM | POA: Diagnosis not present

## 2017-01-29 DIAGNOSIS — R079 Chest pain, unspecified: Secondary | ICD-10-CM | POA: Diagnosis not present

## 2017-01-29 MED ORDER — TRIAMCINOLONE ACETONIDE 55 MCG/ACT NA AERO: 2.0000 | INHALATION_SPRAY | Freq: Every day | NASAL | 12 refills | Status: DC

## 2017-01-29 NOTE — Progress Notes (Signed)
Subjective:    Patient ID: [name], female    DOB: Sep 16, 1930, 83 y.o.   MRN: [id number]  HPI   Here to f/u recent ED visit with a history of pulmonary hypertension, PEs on Coumadin, HTN, and HLD, who presents to the ED with her daughter complaining of right sided chest pain.  She denied fevers, hemoptysis, leg pain, leg swelling, shortness of breath, abdominal pain, nausea, vomiting, rashes, lightheadedness, dizziness or syncope.  Troponin neg, INR slightly low and extra coumadin given. Chest x-ray shows no acute cardiopulmonary disease or significant interval change. CT angiogram of her chest to rule out PE was obtained due to this patient's history of PE, and chest pain. CT angiographic no evidence of PE. It does show chronic postinfectious changes.  Asked to f/u here.  Pt states no further chest pain, pleuritic or otherwise. ST, but has had some cough worsening due to post nasal gtt.  Using nebs well at home, takes her meds including the claritin, but still nasal congesiton. Has not been taking the rhinocort b/c too expensive., but now OTC and may go back to it. Past Medical History:  Diagnosis Date  . ALLERGIC RHINITIS   . ANXIETY   . Bronchiectasis 12/11/2011   CT chest dx  . CAROTID ARTERY STENOSIS, RIGHT   . Chronic rhinitis   . COMMON MIGRAINE   . Complication of anesthesia    dizzy  . COPD   . CORONARY ARTERY DISEASE   . CYST/PSEUDOCYST, PANCREAS   . DEPRESSION   . DIVERTICULOSIS, COLON   . DIZZINESS, CHRONIC   . Esophageal stricture   . GERD   . HYPERLIPIDEMIA   . HYPERTENSION   . OSTEOPOROSIS   . OVERACTIVE BLADDER   . Pulmonary embolism ([place]) 2008  . PULMONARY HYPERTENSION, SECONDARY   . Stroke ([place])   . Tubular adenoma of colon 10/2013  . Unspecified hearing loss    Past Surgical History:  Procedure Laterality Date  . ABDOMINAL HYSTERECTOMY  1965  . APPENDECTOMY  1963  . CHOLECYSTECTOMY  1963  . COLONOSCOPY N/A 10/19/2013   Procedure: COLONOSCOPY;   Surgeon: Ladene Artist, MD;  Location: [facility];  Service: Endoscopy;  Laterality: N/A;  . EGD  1999  . PANCREATIC CYST DRAINAGE     x 2  . s/p sinus surgury    . TONSILLECTOMY      reports that she has never smoked. She has never used smokeless tobacco. She reports that she does not drink alcohol or use drugs. family history includes Breast cancer in her daughter; Emphysema in her father; Heart disease in her mother; Lymphoma in her sister; Stroke in her father. Allergies  Allergen Reactions  . Amoxicillin-Pot Clavulanate     REACTION: diarrhea Has patient had a PCN reaction causing immediate rash, facial/tongue/throat swelling, SOB or lightheadedness with hypotension: unknown Has patient had a PCN reaction causing severe rash involving mucus membranes or skin necrosis: unknown Has patient had a PCN reaction that required hospitalization : unknown Has patient had a PCN reaction occurring within the last 10 years: yes   pt states she should be able to take penicillin, but cant remember actually taking it   . Ciclesonide     REACTION: bad smell  . Ciprofloxacin Other (See Comments)    dizziness  . Raloxifene     REACTION: risk of recurrent stroke  . Sulfonamide Derivatives     REACTION: tongue swells   Current Outpatient Prescriptions on File Prior to  Visit  Medication Sig Dispense Refill  . amLODipine (NORVASC) 5 MG tablet TAKE 1 TABLET EVERY DAY (Patient taking differently: TAKE 5 MG BY MOUTH EVERY DAY) 90 tablet 1  . budesonide (RHINOCORT ALLERGY) 32 MCG/ACT nasal spray Place 1 spray into both nostrils daily as needed for rhinitis or allergies.     . Digestive Enzymes (PAPAYA AND ENZYMES) CHEW Chew 2 tablets by mouth 3 (three) times daily with meals as needed (digestion of big meals).     . famotidine (PEPCID) 40 MG tablet Take 1 tablet (40 mg total) by mouth daily. (Patient taking differently: Take 20 mg by mouth 2 (two) times daily. ) 90 tablet 3  . ipratropium-albuterol  (DUONEB) 0.5-2.5 (3) MG/3ML SOLN Take 3 mLs by nebulization every 6 (six) hours as needed. 360 mL 11  . loratadine (CLARITIN) 10 MG tablet Take 10 mg by mouth daily.     [name] losartan (COZAAR) 100 MG tablet TAKE 1 TABLET EVERY DAY 90 tablet 1  . meclizine (ANTIVERT) 12.5 MG tablet Take 1 tablet (12.5 mg total) by mouth 3 (three) times daily as needed for dizziness. 40 tablet 3  . montelukast (SINGULAIR) 10 MG tablet Take 1 tablet (10 mg total) by mouth daily. 90 tablet 3  . Multiple Vitamins-Minerals (MACUVITE) TABS Take 1 tablet by mouth daily.      . Probiotic Product (PROBIOTIC DAILY PO) Take 1 tablet by mouth daily.    [name] Respiratory Therapy Supplies (FLUTTER) DEVI As directed 1 each 0  . Tiotropium Bromide Monohydrate (SPIRIVA RESPIMAT) 1.25 MCG/ACT AERS Inhale 2 puffs into the lungs daily. 1 Inhaler 5  . warfarin (COUMADIN) 5 MG tablet TAKE AS DIRECTED BY ANTICOAGULATION CLINIC (Patient taking differently: Take 5 mg by mouth on sunday and 2.5 mg all other days) 90 tablet 0   No current facility-administered medications on file prior to visit.    Review of Systems  Constitutional: Negative for other unusual diaphoresis or sweats HENT: Negative for ear discharge or swelling Eyes: Negative for other worsening visual disturbances Respiratory: Negative for stridor or other swelling  Gastrointestinal: Negative for worsening distension or other blood Genitourinary: Negative for retention or other urinary change Musculoskeletal: Negative for other MSK pain or swelling Skin: Negative for color change or other new lesions Neurological: Negative for worsening tremors and other numbness  Psychiatric/Behavioral: Negative for worsening agitation or other fatigue No other exam findings    Objective:   Physical Exam BP (!) 154/88   Pulse 79   Ht 5\' 5"  (1.651 m)   Wt 153 lb (69.4 kg)   SpO2 96%   BMI 25.46 kg/m  VS noted,  Constitutional: Pt appears in NAD HENT: Head: NCAT.  Right Ear:  External ear normal.  Left Ear: External ear normal.  Eyes: . Pupils are equal, round, and reactive to light. Conjunctivae and EOM are normal Nose: without d/c or deformity Neck: Neck supple. Gross normal ROM Cardiovascular: Normal rate and regular rhythm.   Pulmonary/Chest: Effort normal and breath sounds without rales or wheezing.  Abd:  Soft, NT, ND, + BS, no organomegaly Neurological: Pt is alert. At baseline orientation, motor grossly intact Skin: Skin is warm. No rashes, other new lesions, no LE edema Psychiatric: Pt behavior is normal without agitation  No other exam findings       Assessment & Plan:

## 2017-01-29 NOTE — Assessment & Plan Note (Signed)
rhinocort now expensive, ok to change to nasacort asd,  to f/u any worsening symptoms or concerns

## 2017-01-29 NOTE — Assessment & Plan Note (Signed)
Pleuritic, neg ED evaluation as above, exam c/w likely MSK etiology, I suspect may have been caused by coughing due to allergy symptoms seasonal flare, now resolved, no other eval or tx needed at this time

## 2017-01-29 NOTE — Assessment & Plan Note (Signed)
Mild elev today, BP at home usually < 140/90,  Declines change in tx today, o/w stable overall by history and exam, recent data reviewed with pt, and pt to continue medical treatment as before,  to f/u any worsening symptoms or concerns BP Readings from Last 3 Encounters:  01/29/17 (!) 154/88  01/16/17 (!) 124/58  12/19/16 118/80

## 2017-01-29 NOTE — Patient Instructions (Addendum)
Please take all new medication as prescribed - the nasacort as directed  Please continue all other medications as before, and refills have been done if requested.  Please have the pharmacy call with any other refills you may need.  Please keep your appointments with your specialists as you may have planned Please return in 6 months, or sooner if needed

## 2017-01-29 NOTE — Progress Notes (Signed)
Pre visit review using our clinic review tool, if applicable. No additional management support is needed unless otherwise documented below in the visit note. 

## 2017-02-11 ENCOUNTER — Ambulatory Visit (INDEPENDENT_AMBULATORY_CARE_PROVIDER_SITE_OTHER): Payer: Medicare Other | Admitting: General Practice

## 2017-02-11 DIAGNOSIS — Z5181 Encounter for therapeutic drug level monitoring: Secondary | ICD-10-CM | POA: Diagnosis not present

## 2017-02-11 DIAGNOSIS — I2699 Other pulmonary embolism without acute cor pulmonale: Secondary | ICD-10-CM

## 2017-02-11 LAB — POCT INR: INR: 3

## 2017-02-11 NOTE — Progress Notes (Signed)
I agree with this plan.

## 2017-02-11 NOTE — Patient Instructions (Signed)
Pre visit review using our clinic review tool, if applicable. No additional management support is needed unless otherwise documented below in the visit note. 

## 2017-02-13 ENCOUNTER — Ambulatory Visit: Payer: Medicare Other

## 2017-02-20 ENCOUNTER — Ambulatory Visit: Payer: Medicare Other

## 2017-02-20 DIAGNOSIS — R5383 Other fatigue: Secondary | ICD-10-CM | POA: Diagnosis not present

## 2017-02-20 DIAGNOSIS — M810 Age-related osteoporosis without current pathological fracture: Secondary | ICD-10-CM | POA: Diagnosis not present

## 2017-02-20 DIAGNOSIS — E559 Vitamin D deficiency, unspecified: Secondary | ICD-10-CM | POA: Diagnosis not present

## 2017-03-06 DIAGNOSIS — M810 Age-related osteoporosis without current pathological fracture: Secondary | ICD-10-CM | POA: Diagnosis not present

## 2017-03-06 DIAGNOSIS — E559 Vitamin D deficiency, unspecified: Secondary | ICD-10-CM | POA: Diagnosis not present

## 2017-03-18 ENCOUNTER — Other Ambulatory Visit: Payer: Self-pay | Admitting: Internal Medicine

## 2017-03-18 ENCOUNTER — Ambulatory Visit (INDEPENDENT_AMBULATORY_CARE_PROVIDER_SITE_OTHER): Payer: Medicare Other | Admitting: General Practice

## 2017-03-18 DIAGNOSIS — Z5181 Encounter for therapeutic drug level monitoring: Secondary | ICD-10-CM

## 2017-03-18 LAB — POCT INR: INR: 1.9

## 2017-03-18 NOTE — Patient Instructions (Signed)
Pre visit review using our clinic review tool, if applicable. No additional management support is needed unless otherwise documented below in the visit note. 

## 2017-03-18 NOTE — Progress Notes (Signed)
I agree with this plan.

## 2017-03-21 ENCOUNTER — Telehealth: Payer: Self-pay | Admitting: *Deleted

## 2017-03-21 ENCOUNTER — Other Ambulatory Visit: Payer: Self-pay | Admitting: Internal Medicine

## 2017-03-21 MED ORDER — MECLIZINE HCL 12.5 MG PO TABS: 12.5000 mg | ORAL_TABLET | Freq: Three times a day (TID) | ORAL | 3 refills | Status: DC | PRN

## 2017-03-21 NOTE — Telephone Encounter (Signed)
Pt left msg on triage requesting new rx for the Meclizine. She states MD gave her some while ago but it has expired. Per chart last refilled 06/2014. pls advise,,,/lmb

## 2017-03-21 NOTE — Telephone Encounter (Signed)
Done erx 

## 2017-03-22 NOTE — Telephone Encounter (Signed)
Notified pt rx has been called to pharmacy...Chryl Heck

## 2017-03-28 DIAGNOSIS — H35363 Drusen (degenerative) of macula, bilateral: Secondary | ICD-10-CM | POA: Diagnosis not present

## 2017-03-28 DIAGNOSIS — Z961 Presence of intraocular lens: Secondary | ICD-10-CM | POA: Diagnosis not present

## 2017-03-28 DIAGNOSIS — H35033 Hypertensive retinopathy, bilateral: Secondary | ICD-10-CM | POA: Diagnosis not present

## 2017-03-28 DIAGNOSIS — H353132 Nonexudative age-related macular degeneration, bilateral, intermediate dry stage: Secondary | ICD-10-CM | POA: Diagnosis not present

## 2017-04-02 ENCOUNTER — Telehealth: Payer: Self-pay | Admitting: Gastroenterology

## 2017-04-02 NOTE — Telephone Encounter (Signed)
Patient reports diarrhea, rectal bleeding, and rectal bleeding. Occasionally has dark stools.  She is on warfarin. She will come in for eval with Tye Savoy RNP on 04/09/17 11:00

## 2017-04-09 ENCOUNTER — Ambulatory Visit: Payer: Medicare Other | Admitting: Nurse Practitioner

## 2017-04-15 ENCOUNTER — Ambulatory Visit (INDEPENDENT_AMBULATORY_CARE_PROVIDER_SITE_OTHER): Payer: Medicare Other | Admitting: General Practice

## 2017-04-15 DIAGNOSIS — Z5181 Encounter for therapeutic drug level monitoring: Secondary | ICD-10-CM

## 2017-04-15 DIAGNOSIS — I2699 Other pulmonary embolism without acute cor pulmonale: Secondary | ICD-10-CM

## 2017-04-15 LAB — POCT INR: INR: 2.8

## 2017-04-15 NOTE — Progress Notes (Signed)
I agree with this plan.

## 2017-04-15 NOTE — Patient Instructions (Signed)
Pre visit review using our clinic review tool, if applicable. No additional management support is needed unless otherwise documented below in the visit note. 

## 2017-04-25 ENCOUNTER — Encounter: Payer: Self-pay | Admitting: Internal Medicine

## 2017-04-25 ENCOUNTER — Ambulatory Visit (INDEPENDENT_AMBULATORY_CARE_PROVIDER_SITE_OTHER): Payer: Medicare Other | Admitting: Internal Medicine

## 2017-04-25 ENCOUNTER — Other Ambulatory Visit (INDEPENDENT_AMBULATORY_CARE_PROVIDER_SITE_OTHER): Payer: Medicare Other

## 2017-04-25 DIAGNOSIS — R350 Frequency of micturition: Secondary | ICD-10-CM

## 2017-04-25 DIAGNOSIS — K219 Gastro-esophageal reflux disease without esophagitis: Secondary | ICD-10-CM

## 2017-04-25 DIAGNOSIS — Z1231 Encounter for screening mammogram for malignant neoplasm of breast: Secondary | ICD-10-CM

## 2017-04-25 DIAGNOSIS — I10 Essential (primary) hypertension: Secondary | ICD-10-CM

## 2017-04-25 LAB — URINALYSIS, ROUTINE W REFLEX MICROSCOPIC
Bilirubin Urine: NEGATIVE
HGB URINE DIPSTICK: NEGATIVE
Ketones, ur: NEGATIVE
Leukocytes, UA: NEGATIVE
NITRITE: NEGATIVE
RBC / HPF: NONE SEEN (ref 0–?)
SPECIFIC GRAVITY, URINE: 1.01 (ref 1.000–1.030)
Total Protein, Urine: NEGATIVE
Urine Glucose: NEGATIVE
Urobilinogen, UA: 0.2 (ref 0.0–1.0)
pH: 5.5 (ref 5.0–8.0)

## 2017-04-25 MED ORDER — IPRATROPIUM-ALBUTEROL 0.5-2.5 (3) MG/3ML IN SOLN: 3.0000 mL | Freq: Four times a day (QID) | RESPIRATORY_TRACT | 11 refills | Status: DC | PRN

## 2017-04-25 MED ORDER — NITROFURANTOIN MACROCRYSTAL 100 MG PO CAPS: 100.0000 mg | ORAL_CAPSULE | Freq: Two times a day (BID) | ORAL | 0 refills | Status: DC

## 2017-04-25 NOTE — Assessment & Plan Note (Addendum)
Wit assoc symptoms and flank pain seem likely c/w UTI +/- pyelonephritis, will need ua and cx, also macrobid bid - done erx,  to f/u any worsening symptoms or concerns

## 2017-04-25 NOTE — Progress Notes (Signed)
Subjective:    Patient ID: [name], female    DOB: [birth year], 83 y.o.   MRN: [id number]  HPI here with 3 days onset dysuria, chills, feverish and bilat flank pain, as well as urinary freq but Denies urinary symptoms such as urgency, hematuria or n/v.  Also has incidental ST for unclear reasons except also has dry mouth, mouth breathes at night.  Has occasionally reflux breakthrough at night, already taking pepci 40 1/2 po bid.  Pt denies chest pain, increased sob or doe, wheezing, orthopnea, PND, increased LE swelling, palpitations, dizziness or syncope.  Pt denies new neurological symptoms such as new headache, or facial or extremity weakness or numbness   Pt denies polydipsia, polyuria.  Last proven UTi 2016 with e coli resistant to ampicillin, pt also has PCN allergy.  macorbid worked well aug 2016 Past Medical History:  Diagnosis Date  . ALLERGIC RHINITIS   . ANXIETY   . Bronchiectasis 12/11/2011   CT chest dx  . CAROTID ARTERY STENOSIS, RIGHT   . Chronic rhinitis   . COMMON MIGRAINE   . Complication of anesthesia    dizzy  . COPD   . CORONARY ARTERY DISEASE   . CYST/PSEUDOCYST, PANCREAS   . DEPRESSION   . DIVERTICULOSIS, COLON   . DIZZINESS, CHRONIC   . Esophageal stricture   . GERD   . HYPERLIPIDEMIA   . HYPERTENSION   . OSTEOPOROSIS   . OVERACTIVE BLADDER   . Pulmonary embolism ([place]) 2008  . PULMONARY HYPERTENSION, SECONDARY   . Stroke ([place])   . Tubular adenoma of colon 10/2013  . Unspecified hearing loss    Past Surgical History:  Procedure Laterality Date  . ABDOMINAL HYSTERECTOMY  1965  . APPENDECTOMY  1963  . CHOLECYSTECTOMY  1963  . COLONOSCOPY N/A 10/19/2013   Procedure: COLONOSCOPY;  Surgeon: Ladene Artist, MD;  Location: [facility];  Service: Endoscopy;  Laterality: N/A;  . EGD  1999  . PANCREATIC CYST DRAINAGE     x 2  . s/p sinus surgury    . TONSILLECTOMY      reports that she has never smoked. She has never used smokeless tobacco. She  reports that she does not drink alcohol or use drugs. family history includes Breast cancer in her daughter; Emphysema in her father; Heart disease in her mother; Lymphoma in her sister; Stroke in her father. Allergies  Allergen Reactions  . Amoxicillin-Pot Clavulanate     REACTION: diarrhea Has patient had a PCN reaction causing immediate rash, facial/tongue/throat swelling, SOB or lightheadedness with hypotension: unknown Has patient had a PCN reaction causing severe rash involving mucus membranes or skin necrosis: unknown Has patient had a PCN reaction that required hospitalization : unknown Has patient had a PCN reaction occurring within the last 10 years: yes   pt states she should be able to take penicillin, but cant remember actually taking it   . Ciclesonide     REACTION: bad smell  . Ciprofloxacin Other (See Comments)    dizziness  . Raloxifene     REACTION: risk of recurrent stroke  . Sulfonamide Derivatives     REACTION: tongue swells   Current Outpatient Prescriptions on File Prior to Visit  Medication Sig Dispense Refill  . amLODipine (NORVASC) 5 MG tablet TAKE 1 TABLET EVERY DAY (Patient taking differently: TAKE 5 MG BY MOUTH EVERY DAY) 90 tablet 1  . budesonide (RHINOCORT ALLERGY) 32 MCG/ACT nasal spray Place 1 spray into both nostrils daily as  needed for rhinitis or allergies.     . Digestive Enzymes (PAPAYA AND ENZYMES) CHEW Chew 2 tablets by mouth 3 (three) times daily with meals as needed (digestion of big meals).     . famotidine (PEPCID) 40 MG tablet Take 1 tablet (40 mg total) by mouth daily. (Patient taking differently: Take 20 mg by mouth 2 (two) times daily. ) 90 tablet 3  . loratadine (CLARITIN) 10 MG tablet Take 10 mg by mouth daily.     [name] losartan (COZAAR) 100 MG tablet TAKE 1 TABLET EVERY DAY 90 tablet 1  . meclizine (ANTIVERT) 12.5 MG tablet Take 1 tablet (12.5 mg total) by mouth 3 (three) times daily as needed for dizziness. 40 tablet 3  . Multiple  Vitamins-Minerals (MACUVITE) TABS Take 1 tablet by mouth daily.      . Probiotic Product (PROBIOTIC DAILY PO) Take 1 tablet by mouth daily.    [name] Respiratory Therapy Supplies (FLUTTER) DEVI As directed 1 each 0  . Tiotropium Bromide Monohydrate (SPIRIVA RESPIMAT) 1.25 MCG/ACT AERS Inhale 2 puffs into the lungs daily. 1 Inhaler 5  . triamcinolone (NASACORT AQ) 55 MCG/ACT AERO nasal inhaler Place 2 sprays into the nose daily. 1 Inhaler 12  . warfarin (COUMADIN) 5 MG tablet TAKE AS DIRECTED BY ANTICOAGULATION CLINIC 60 tablet 0   No current facility-administered medications on file prior to visit.    Review of Systems  Constitutional: Negative for other unusual diaphoresis or sweats HENT: Negative for ear discharge or swelling Eyes: Negative for other worsening visual disturbances Respiratory: Negative for stridor or other swelling  Gastrointestinal: Negative for worsening distension or other blood Genitourinary: Negative for retention or other urinary change Musculoskeletal: Negative for other MSK pain or swelling Skin: Negative for color change or other new lesions Neurological: Negative for worsening tremors and other numbness  Psychiatric/Behavioral: Negative for worsening agitation or other fatigue All other system neg per pt    Objective:   Physical Exam BP 134/80   Pulse 76   Ht 5\' 5"  (1.651 m)   Wt 150 lb (68 kg)   SpO2 95%   BMI 24.96 kg/m  VS noted,  Constitutional: Pt appears in NAD HENT: Head: NCAT.  Right Ear: External ear normal.  Left Ear: External ear normal.  Eyes: . Pupils are equal, round, and reactive to light. Conjunctivae and EOM are normal Nose: without d/c or deformity Neck: Neck supple. Gross normal ROM Cardiovascular: Normal rate and regular rhythm.   Pulmonary/Chest: Effort normal and breath sounds without rales or wheezing.  Abd:  Soft, ND, + BS, no organomegaly with mild low mid abd tender, also has bilat flank tender Neurological: Pt is alert. At  baseline orientation, motor grossly intact Skin: Skin is warm. No rashes, other new lesions, no LE edema Psychiatric: Pt behavior is normal without agitation  No other exam findings    Assessment & Plan:

## 2017-04-25 NOTE — Assessment & Plan Note (Signed)
.  stable overall by history and exam, recent data reviewed with pt, and pt to continue medical treatment as before,  to f/u any worsening symptoms or concerns BP Readings from Last 3 Encounters:  04/25/17 134/80  01/29/17 (!) 154/88  01/16/17 (!) 124/58

## 2017-04-25 NOTE — Patient Instructions (Signed)
Please take all new medication as prescribed - the antibiotic  Please continue all other medications as before, and refills have been done if requested.  Please have the pharmacy call with any other refills you may need.  Please keep your appointments with your specialists as you may have planned  Please go to the LAB in the Basement (turn left off the elevator) for the tests to be done today  You will be contacted by phone if any changes need to be made immediately.  Otherwise, you will receive a letter about your results with an explanation, but please check with MyChart first.  Please remember to sign up for MyChart if you have not done so, as this will be important to you in the future with finding out test results, communicating by private email, and scheduling acute appointments online when needed.   

## 2017-04-25 NOTE — Assessment & Plan Note (Signed)
Pt declines change of pepcid to PPI as she heard these type meds are bad

## 2017-04-26 LAB — URINE CULTURE: Organism ID, Bacteria: NO GROWTH

## 2017-04-30 ENCOUNTER — Encounter: Payer: Self-pay | Admitting: Emergency Medicine

## 2017-04-30 ENCOUNTER — Ambulatory Visit (INDEPENDENT_AMBULATORY_CARE_PROVIDER_SITE_OTHER): Payer: Medicare Other | Admitting: Emergency Medicine

## 2017-04-30 DIAGNOSIS — J309 Allergic rhinitis, unspecified: Secondary | ICD-10-CM

## 2017-04-30 DIAGNOSIS — J471 Bronchiectasis with (acute) exacerbation: Secondary | ICD-10-CM

## 2017-04-30 DIAGNOSIS — J449 Chronic obstructive pulmonary disease, unspecified: Secondary | ICD-10-CM

## 2017-04-30 DIAGNOSIS — J9611 Chronic respiratory failure with hypoxia: Secondary | ICD-10-CM

## 2017-04-30 NOTE — Progress Notes (Signed)
Subjective:    Patient ID: [name], female    DOB: 11/03/1929   MRN: [id number]  HPI 71  yowf never smoker, allergic rhinitis, hx bilat PE in September 2008, with asociated Pulm HTN (PASP 70 with intact LV fxn). Moderately Severe obstruction on pulmonary function testing from 2009 With hypoxemia . Also with a history of GERD and allergic rhinitis That contribute chronic cough.  She has bronchiectasis noted On CT scan of the chest with some associated micronodular disease particularly in the right middle lobe and lower lobes bilaterally. She experiences frequent episodes of bronchitis and acute exacerbations of her bronchiectasis that require antibiotic treatment. Her last appears to have been in January 2017 when she was seen in this office.    She is using rhinocort, taking loratadine every qhs. She has albuterol that she uses prn, no other BD's at this time. She uses a flutter valve every other day. She is using o2 at 1l/min with housework, chores. Using mucinex as needed. She feels that she is doing fairly well.  She has some L ear fullness and pressure. She has throat clearing. She has every day cough - yellow to green .   ROV 06/13/16 -- Patient follows up today for history of Bronchiectasis with associated moderately severe obstructive lung disease, question mycobacterial colonization. Also with allergic rhinitis currently managed on Rhinocort and loratadine. At our last visit we tried starting Symbicort twice a day to see if she would benefit. She seemed to get a lot of rhinitis, cough. Also had some anxiousness and heart racing. She stopped it after 2 days. She is doing well currently. She has nebulized albuterol available to use prn, not needing currently. She uses O2 prn. Reliable with rhinocort and loratadine.   ROV 09/19/16 -- This is a follow-up visit for history of COPD, moderately severe obstructive lung disease in the setting of bronchiectasis and possible mycobacterial  colonization. She also has hx remote PE, PAH, allergic rhinitis. She tells me that she is feeling a bit less energetic. She is independent. Her breathing has been good - does get congestion, some cough. Remains on rhinocort, loratadine, singulair. She was treated with pred + abx for bronchitis last month > helped her. Uses duoneb about once a day.   ROV 12/19/16 -- this follow-up visit for patient with bronchiectasis and associated moderately severe obstructive lung disease. She has suspected mycobacterial colonization (unproven). She also has a history of pulmonary embolism with associated pulmonary arterial hypertension. She was seen twice in our office since late January, treated for a suspected RUL CAP based on sx and CXR. Received omnicef and then levaquin.  Her culture data shows that she has a history of pseudomonas intermediate to cipro from 05/17/16.  She reports today that she is doing better, still has cough but more clear mucous. She is using DuoNeb once a day to help clear mucous. She has been on Symbicort in the past - did not tolerate. Her exercise tolerance is OK. Uses O2 at 2L/min with some exertion.   Rhinitis regimen > Rhinocort, Singulair, loratadine.   ROV 04/30/17 -- [name] is a never smoker, has a history of remote thromboembolic disease, moderately severe COPD, bronchiectasis and micronodular disease suspicious for mycobacterial colonization (never proven). At her last visit we tried starting Spiriva to see if she would benefit both the guarded dyspnea and also secretion clearance. She was seen in the emergency department in April for chest discomfort that she described as worse with breathing,  and a CT scan of the chest was performed. Woke her from sleeping. I have reviewed. This shows bronchiectatic change and scarring especially in the right middle lobe, right lower lobe.    She is having sore throat, irritation. May have appeared when she started the spiriva. She stopped it around  2-3 weeks ago and the throat may be slightly better. She did note that the spiriva helped her chest secretions and heaviness. She is currently on Pepcid, used to be on PPI but changed. She is on her allergy regimen > rhinocort, loratadine, off singulair although she cannot recall when it was stopped.         Objective:   Physical Exam Vitals:   04/30/17 1352  BP: 130/82  Pulse: 84  SpO2: 90%  Weight: 150 lb (68 kg)  Height: 5\' 5"  (1.651 m)   Gen: Pleasant, well-nourished, in no distress,  normal affect, clears throat often  ENT: No lesions,  mouth clear,  oropharynx clear, no postnasal drip  Neck: No JVD, no stridor  Lungs: No use of accessory muscles, few insp squeaks R base, L clear  Cardiovascular: RRR, heart sounds normal, no murmur or gallops, no peripheral edema  Musculoskeletal: No deformities, no cyanosis or clubbing  Neuro: alert, non focal  Skin: Warm, no lesions or rashes     Assessment & Plan:  COPD (chronic obstructive pulmonary disease) ([place]) She did not tolerate Spiriva due to throat irritation. This may become more tolerable in the future get her reflux better treated. Defer any bronchodilator at this time and revisit at her next visit. Continue aggressive control of her allergic rhinitis.  Bronchiectasis Slight progression of her right lower lobe scar and associated bronchiectasis. No evidence for clinical worsening  Allergic rhinitis Continue rhinocort, loratadine. Will hold off on restart singulair for now. May be beneficial at some point as we go forward.   Chronic respiratory failure Continue oxygen with exertion.   Baltazar Apo, MD, PhD 04/30/2017, 2:28 PM [facility] [phone] or if no answer [phone]

## 2017-04-30 NOTE — Patient Instructions (Signed)
Many of your symptoms are consistent with worsening of your esophageal reflux. Please speak with Dr. [name] regarding the pros and cons of changing your Pepcid back to your prior reflux regimen.  We will not restart Spiriva at this time. We may be able to do so in the future your throat irritation improves with the treatment of your reflux.  Continue Rhinocort, loratadine (Claritin) We will not restart Singulair at this time Follow with Dr [name] in 4 months or sooner if you have any problems.

## 2017-04-30 NOTE — Assessment & Plan Note (Signed)
Continue oxygen with exertion.

## 2017-04-30 NOTE — Assessment & Plan Note (Signed)
She did not tolerate Spiriva due to throat irritation. This may become more tolerable in the future get her reflux better treated. Defer any bronchodilator at this time and revisit at her next visit. Continue aggressive control of her allergic rhinitis.

## 2017-04-30 NOTE — Assessment & Plan Note (Signed)
Slight progression of her right lower lobe scar and associated bronchiectasis. No evidence for clinical worsening

## 2017-04-30 NOTE — Assessment & Plan Note (Signed)
Continue rhinocort, loratadine. Will hold off on restart singulair for now. May be beneficial at some point as we go forward.

## 2017-05-01 ENCOUNTER — Telehealth: Payer: Self-pay | Admitting: Internal Medicine

## 2017-05-01 NOTE — Telephone Encounter (Signed)
Please call pt regarding lab results. I did let her know that they look fine.

## 2017-05-09 ENCOUNTER — Encounter: Payer: Self-pay | Admitting: Gastroenterology

## 2017-05-09 ENCOUNTER — Telehealth: Payer: Self-pay

## 2017-05-09 ENCOUNTER — Ambulatory Visit (INDEPENDENT_AMBULATORY_CARE_PROVIDER_SITE_OTHER): Payer: Medicare Other | Admitting: Gastroenterology

## 2017-05-09 VITALS — BP 150/80 | HR 68 | Ht 65.0 in | Wt 150.0 lb

## 2017-05-09 DIAGNOSIS — K5904 Chronic idiopathic constipation: Secondary | ICD-10-CM

## 2017-05-09 DIAGNOSIS — K219 Gastro-esophageal reflux disease without esophagitis: Secondary | ICD-10-CM

## 2017-05-09 DIAGNOSIS — K648 Other hemorrhoids: Secondary | ICD-10-CM | POA: Diagnosis not present

## 2017-05-09 MED ORDER — RANITIDINE HCL 300 MG PO CAPS: 300.0000 mg | ORAL_CAPSULE | Freq: Two times a day (BID) | ORAL | 11 refills | Status: DC

## 2017-05-09 NOTE — Telephone Encounter (Signed)
   [name] 1930/05/13 [id number]  Dear Dr. [name]:  We have scheduled the above named patient for a(n) hemorrhoid banding procedure. Our records show that (s)he is on anticoagulation therapy.  Please advise as to whether the patient may come off their therapy of coumadin  5 days prior to their procedure which is scheduled for 05/30/17.  Please route your response to Marlon Pel, CMA or fax response to [phone].  Sincerely,    [facility]

## 2017-05-09 NOTE — Patient Instructions (Signed)
We have sent the following medications to your pharmacy for you to pick up at your convenience: ranitidine.   Patient advised to avoid spicy, acidic, citrus, chocolate, mints, fruit and fruit juices.  Limit the intake of caffeine, alcohol and Soda.  Don't exercise too soon after eating.  Don't lie down within 3-4 hours of eating.  Elevate the head of your bed.   You can take Miralax over the counter mixing 17 grams in 8 oz of water daily.   We will contact you regarding holding your coumadin prior to the hemorrhoid banding on 05/30/17 at 3:15pm with Dr. Silvano Rusk.   Thank you for choosing me and [facility].  Pricilla Riffle. Dagoberto Ligas., MD., [facility] Regal

## 2017-05-09 NOTE — Progress Notes (Signed)
    History of Present Illness: This is an 83 year old female with multiple comorbidities, including COPD, bronchiectasis, CAD, h/o PE, GERD c/o constipation, hemorrhoidal bleeding and nausea. She notes mild constipation and that she takes a laxative that often leads to loose stools. She has frequent problems with uncomfortable, bleeding and prolapsing hemorrhoids. These symptoms have worsened along with her constipation and diarrhea symptoms. She has had frequent reflux problems and nausea after she discontinued PPI medications due to concerns over renal side effects.  Colonoscopy 10/2013:  Moderate internal hemorrhoids Moderate left colon diverticulosis 1 cm polyp   Current Medications, Allergies, Past Medical History, Past Surgical History, Family History and Social History were reviewed in [employer] record.  Physical Exam: General: Well developed, well nourished, elderly, no acute distress Head: Normocephalic and atraumatic Eyes:  sclerae anicteric, EOMI Ears: Normal auditory acuity Mouth: No deformity or lesions Lungs: Clear throughout to auscultation Heart: Regular rate and rhythm; no murmurs, rubs or bruits Abdomen: Soft, non tender and non distended. No masses, hepatosplenomegaly or hernias noted. Normal Bowel sounds Rectal: Small erythematous external tags, no internal lesions, Hemoccult-negative stool in the vault. Musculoskeletal: Symmetrical with no gross deformities  Pulses:  Normal pulses noted Extremities: No clubbing, cyanosis, edema or deformities noted Neurological: Alert oriented x 4, grossly nonfocal Psychological:  Alert and cooperative. Normal mood and affect  Assessment and Recommendations:  1. Internal hemorrhoids with bleeding, prolapse. Referral for consideration of hemorrhoidal banding. Continue standard rectal care instructions, Tucks bid prn, 1% HC cream bid prn for now.   2. GERD likely leading to nausea. She would like to avoid  PPIs. Follow all standard antireflux measures. Begin ranitidine 300 mg twice daily. If symptoms are not controlled she will consider resuming a PPI for management.  3. Constipation. MiraLAX 1-2 scoops per day titrated for complete bowel movements daily.  4. Hold Coumadin 5 days before banding procedures - will instruct when and how to resume after procedure. Low but real risk of cardiovascular event such as heart attack, stroke, embolism, thrombosis or ischemia/infarct of other organs off Coumadin explained and need to seek urgent help if this occurs. The patient consents to proceed. Will communicate by phone or EMR with patient's prescribing provider to confirm that holding Coumadin is reasonable in this case.  Coumadin.

## 2017-05-09 NOTE — Telephone Encounter (Signed)
OK for off coumadin x 5 days prior to procedure

## 2017-05-13 ENCOUNTER — Ambulatory Visit (INDEPENDENT_AMBULATORY_CARE_PROVIDER_SITE_OTHER): Payer: Medicare Other | Admitting: General Practice

## 2017-05-13 DIAGNOSIS — Z5181 Encounter for therapeutic drug level monitoring: Secondary | ICD-10-CM

## 2017-05-13 LAB — POCT INR: INR: 1.6

## 2017-05-13 NOTE — Progress Notes (Signed)
I agree with this plan.

## 2017-05-13 NOTE — Patient Instructions (Signed)
Pre visit review using our clinic review tool, if applicable. No additional management support is needed unless otherwise documented below in the visit note. 

## 2017-05-14 ENCOUNTER — Telehealth: Payer: Self-pay

## 2017-05-14 DIAGNOSIS — I2699 Other pulmonary embolism without acute cor pulmonale: Secondary | ICD-10-CM

## 2017-05-14 NOTE — Telephone Encounter (Signed)
Spoke Shirron (Dr. Gwynn Burly CMA) and asked if there office could put on a INR POCT lab the day of the banding so patient can have this done before the banding. Shirron states she will put the lab test in Epic.   Spoke with patient and went over instructions on when to hold coumadin prior to banding and after banding. Also for her to come in the lab and have a INR POCT done that morning before her banding. Patient verbalized understanding.

## 2017-05-14 NOTE — Telephone Encounter (Signed)
Amanda from GI needed an order entered for pt pre-surgery.

## 2017-05-14 NOTE — Telephone Encounter (Signed)
Let's plan to have her hold the warfarin 1 days before and 3 days after the banding See if primary care will do an INR POCT on the day of the banding so I know what it is - that's where they do it in the office with an instant result

## 2017-05-14 NOTE — Telephone Encounter (Signed)
Left a message with Dr. Gwynn Burly office for nurse to return my call.

## 2017-05-27 ENCOUNTER — Other Ambulatory Visit: Payer: Self-pay | Admitting: Internal Medicine

## 2017-05-28 ENCOUNTER — Other Ambulatory Visit: Payer: Self-pay | Admitting: General Practice

## 2017-05-28 MED ORDER — WARFARIN SODIUM 5 MG PO TABS: ORAL_TABLET | ORAL | 1 refills | Status: DC

## 2017-05-28 NOTE — Telephone Encounter (Signed)
Please help pt to get in contact and request from her coumadin clinic as this is normally refilled there. thanks

## 2017-05-30 ENCOUNTER — Encounter: Payer: Medicare Other | Admitting: Internal Medicine

## 2017-05-31 NOTE — Patient Instructions (Signed)
Pre visit review using our clinic review tool, if applicable. No additional management support is needed unless otherwise documented below in the visit note. 

## 2017-06-03 ENCOUNTER — Encounter: Payer: Self-pay | Admitting: Family Medicine

## 2017-06-03 ENCOUNTER — Ambulatory Visit (INDEPENDENT_AMBULATORY_CARE_PROVIDER_SITE_OTHER): Payer: Medicare Other | Admitting: General Practice

## 2017-06-03 ENCOUNTER — Ambulatory Visit (INDEPENDENT_AMBULATORY_CARE_PROVIDER_SITE_OTHER): Payer: Medicare Other | Admitting: Family Medicine

## 2017-06-03 VITALS — BP 138/66 | HR 90 | Temp 98.5°F | Ht 65.0 in | Wt 147.6 lb

## 2017-06-03 DIAGNOSIS — Z5181 Encounter for therapeutic drug level monitoring: Secondary | ICD-10-CM

## 2017-06-03 DIAGNOSIS — R0781 Pleurodynia: Secondary | ICD-10-CM

## 2017-06-03 LAB — POCT INR: INR: 1.4

## 2017-06-03 NOTE — Patient Instructions (Signed)
I suspect you have a bone bruise from your fall. I do not suspect fracture/bone break with your level of pain. We could do an x-ray but likely little benefit as would not change management unless you were having symptoms  Symptoms to return for: abnormal fatigue, shortness of breath, fever, trouble breathing.

## 2017-06-03 NOTE — Progress Notes (Signed)
Subjective:  [name] is a 83 y.o. year old very pleasant female patient who presents for/with See problem oriented charting ROS- no chest pain, no fever, right sided rib pain noted. No shortness of breath. No edema.    Past Medical History- hypertension, history TIA, COPD, bronchiectasis, appears to have PE history   Medications- reviewed and updated Current Outpatient Prescriptions  Medication Sig Dispense Refill  . amLODipine (NORVASC) 5 MG tablet Take 5 mg by mouth daily.    . budesonide (RHINOCORT ALLERGY) 32 MCG/ACT nasal spray Place 1 spray into both nostrils daily as needed for rhinitis or allergies.     . Digestive Enzymes (PAPAYA AND ENZYMES) CHEW Chew 2 tablets by mouth 3 (three) times daily with meals as needed (digestion of big meals).     . loratadine (CLARITIN) 10 MG tablet Take 10 mg by mouth daily.     [name] losartan (COZAAR) 100 MG tablet TAKE 1 TABLET EVERY DAY 90 tablet 1  . meclizine (ANTIVERT) 12.5 MG tablet Take 1 tablet (12.5 mg total) by mouth 3 (three) times daily as needed for dizziness. 40 tablet 3  . Multiple Vitamins-Minerals (MACUVITE) TABS Take 1 tablet by mouth daily.      . nitrofurantoin (MACRODANTIN) 100 MG capsule Take 100 mg by mouth 2 (two) times daily.  0  . Probiotic Product (PROBIOTIC DAILY PO) Take 1 tablet by mouth daily.    . ranitidine (ZANTAC) 300 MG capsule Take 1 capsule (300 mg total) by mouth 2 (two) times daily. 60 capsule 11  . warfarin (COUMADIN) 5 MG tablet TAKE AS DIRECTED BY ANTICOAGULATION CLINIC 60 tablet 1   Objective: BP 138/66 (BP Location: Left Arm, Patient Position: Sitting, Cuff Size: Large)   Pulse 90   Temp 98.5 F (36.9 C) (Oral)   Ht 5\' 5"  (1.651 m)   Wt 147 lb 9.6 oz (67 kg)   SpO2 90%   BMI 24.56 kg/m  Gen: NAD, resting comfortably before exam CV: RRR no murmurs rubs or gallops Lungs: CTAB no crackles, wheeze, rhonchi Right sided rib pain on mid to right upper back. Moderately tender.  Ext: no edema Skin:  warm, dry, no bruising of skin Neuro: grossly normal, moves all extremities  Assessment/Plan:  Right side pain S: patient fell a week ago onto her right side. Was getting on bed with right knee lifted up and lost balance (hasnt happened before or since that time). Fell backwards and reached right arm out to catch herself but had a cabinet door open and hit her right side on that. Hurts to lay on that side or take deep breaths but not having trouble breathing. Pain up to 8/10 with movement but most of the time minimal. Ice and tylenol have helped somewhat.   High risk patient due to her age and fact she is on coumadin.  A/P: Patient states wanted reassurance with heart and lung exam. We had a long discussion of options for workup but she prefers watchful waiting. Discussed if this is a minor fracture likely 6 weeks to heal but even without fracture could still take weeks. Red flags discussed Patient Instructions  I suspect you have a bone bruise from your fall. I do not suspect fracture/bone break with your level of pain. We could do an x-ray but likely little benefit as would not change management unless you were having symptoms  Symptoms to return for: abnormal fatigue, shortness of breath, fever, trouble breathing.   Future Appointments Date Time Provider  [place]  06/24/2017 3:15 PM Gatha Mayer, MD [facility]  08/06/2017 1:45 PM [name], MD [facility]  09/03/2017 1:45 PM Byrum, Rose Fillers, MD [facility] None    Meds ordered this encounter  Medications  . nitrofurantoin (MACRODANTIN) 100 MG capsule    Sig: Take 100 mg by mouth 2 (two) times daily.    Refill:  0    Return precautions advised.  Garret Reddish, MD

## 2017-06-03 NOTE — Progress Notes (Signed)
I agree with this plan.

## 2017-06-05 DIAGNOSIS — M65341 Trigger finger, right ring finger: Secondary | ICD-10-CM | POA: Diagnosis not present

## 2017-06-17 ENCOUNTER — Ambulatory Visit (INDEPENDENT_AMBULATORY_CARE_PROVIDER_SITE_OTHER): Payer: Medicare Other | Admitting: General Practice

## 2017-06-17 DIAGNOSIS — Z5181 Encounter for therapeutic drug level monitoring: Secondary | ICD-10-CM

## 2017-06-17 DIAGNOSIS — Z7901 Long term (current) use of anticoagulants: Secondary | ICD-10-CM | POA: Diagnosis not present

## 2017-06-17 LAB — POCT INR: INR: 2.7

## 2017-06-17 NOTE — Progress Notes (Signed)
I agree with this plan.

## 2017-06-17 NOTE — Patient Instructions (Signed)
Pre visit review using our clinic review tool, if applicable. No additional management support is needed unless otherwise documented below in the visit note. 

## 2017-06-24 ENCOUNTER — Encounter: Payer: Medicare Other | Admitting: Internal Medicine

## 2017-06-27 ENCOUNTER — Telehealth: Payer: Self-pay

## 2017-06-27 NOTE — Telephone Encounter (Signed)
Pt called stating that she needs albuterol for a nebulizer sent in to [facility]. However I do not see that as a current medication. Please advise.

## 2017-06-28 MED ORDER — ALBUTEROL SULFATE 0.63 MG/3ML IN NEBU: 1.0000 | INHALATION_SOLUTION | Freq: Four times a day (QID) | RESPIRATORY_TRACT | 12 refills | Status: DC | PRN

## 2017-06-28 NOTE — Addendum Note (Signed)
Addended by: [name] on: 06/28/2017 01:09 PM   Modules accepted: Orders

## 2017-06-28 NOTE — Telephone Encounter (Signed)
Ok, this is done 

## 2017-07-15 ENCOUNTER — Ambulatory Visit (INDEPENDENT_AMBULATORY_CARE_PROVIDER_SITE_OTHER): Payer: Medicare Other | Admitting: General Practice

## 2017-07-15 DIAGNOSIS — Z7901 Long term (current) use of anticoagulants: Secondary | ICD-10-CM | POA: Diagnosis not present

## 2017-07-15 DIAGNOSIS — Z23 Encounter for immunization: Secondary | ICD-10-CM | POA: Diagnosis not present

## 2017-07-15 LAB — POCT INR: INR: 3

## 2017-07-15 NOTE — Patient Instructions (Signed)
Pre visit review using our clinic review tool, if applicable. No additional management support is needed unless otherwise documented below in the visit note. 

## 2017-07-15 NOTE — Progress Notes (Signed)
I agree with this plan.

## 2017-07-17 DIAGNOSIS — Z85828 Personal history of other malignant neoplasm of skin: Secondary | ICD-10-CM | POA: Diagnosis not present

## 2017-07-17 DIAGNOSIS — D485 Neoplasm of uncertain behavior of skin: Secondary | ICD-10-CM | POA: Diagnosis not present

## 2017-07-17 DIAGNOSIS — L570 Actinic keratosis: Secondary | ICD-10-CM | POA: Diagnosis not present

## 2017-07-17 DIAGNOSIS — L821 Other seborrheic keratosis: Secondary | ICD-10-CM | POA: Diagnosis not present

## 2017-08-06 ENCOUNTER — Ambulatory Visit (INDEPENDENT_AMBULATORY_CARE_PROVIDER_SITE_OTHER): Payer: Medicare Other | Admitting: Internal Medicine

## 2017-08-06 ENCOUNTER — Encounter: Payer: Self-pay | Admitting: Internal Medicine

## 2017-08-06 VITALS — BP 142/82 | HR 84 | Temp 98.3°F | Resp 18 | Wt 148.2 lb

## 2017-08-06 DIAGNOSIS — R062 Wheezing: Secondary | ICD-10-CM

## 2017-08-06 DIAGNOSIS — R7302 Impaired glucose tolerance (oral): Secondary | ICD-10-CM | POA: Diagnosis not present

## 2017-08-06 DIAGNOSIS — J471 Bronchiectasis with (acute) exacerbation: Secondary | ICD-10-CM | POA: Diagnosis not present

## 2017-08-06 DIAGNOSIS — I10 Essential (primary) hypertension: Secondary | ICD-10-CM

## 2017-08-06 MED ORDER — HYDROCODONE-HOMATROPINE 5-1.5 MG/5ML PO SYRP: 5.0000 mL | ORAL_SOLUTION | Freq: Four times a day (QID) | ORAL | 0 refills | Status: AC | PRN

## 2017-08-06 MED ORDER — PREDNISONE 10 MG PO TABS: ORAL_TABLET | ORAL | 0 refills | Status: DC

## 2017-08-06 MED ORDER — CEFUROXIME AXETIL 250 MG PO TABS: 250.0000 mg | ORAL_TABLET | Freq: Two times a day (BID) | ORAL | 0 refills | Status: AC

## 2017-08-06 MED ORDER — METHYLPREDNISOLONE ACETATE 80 MG/ML IJ SUSP
80.0000 mg | Freq: Once | INTRAMUSCULAR | Status: AC
  Administered 2017-08-06: 80 mg via INTRAMUSCULAR

## 2017-08-06 NOTE — Assessment & Plan Note (Signed)
stable overall by history and exam, recent data reviewed with pt, and pt to continue medical treatment as before,  to f/u any worsening symptoms or concerns Lab Results  Component Value Date   HGBA1C 5.8 05/26/2013

## 2017-08-06 NOTE — Assessment & Plan Note (Signed)
Mild to mod, for depomedrol IM 80, and predpac asd,  to f/u any worsening symptoms or concerns 

## 2017-08-06 NOTE — Assessment & Plan Note (Signed)
stable overall by history and exam, recent data reviewed with pt, and pt to continue medical treatment as before,  to f/u any worsening symptoms or concerns BP Readings from Last 3 Encounters:  08/06/17 (!) 142/82  06/03/17 138/66  05/09/17 (!) 150/80

## 2017-08-06 NOTE — Progress Notes (Signed)
Subjective:    Patient ID: [name], female    DOB: [birth year], 83 y.o.   MRN: [id number]  HPI  Here with acute onset mild to mod 2-3 days ST, HA, general weakness and malaise, with prod cough greenish sputum, with mild sob and wheeziness since yesterday, but Pt denies chest pain, orthopnea, PND, increased LE swelling, palpitations, dizziness or syncope.   Pt denies polydipsia, polyuria. Past Medical History:  Diagnosis Date  . ALLERGIC RHINITIS   . ANXIETY   . Bronchiectasis 12/11/2011   CT chest dx  . CAROTID ARTERY STENOSIS, RIGHT   . Chronic idiopathic constipation   . Chronic rhinitis   . COMMON MIGRAINE   . Complication of anesthesia    dizzy  . COPD   . CORONARY ARTERY DISEASE   . CYST/PSEUDOCYST, PANCREAS   . DEPRESSION   . DIVERTICULOSIS, COLON   . DIZZINESS, CHRONIC   . Esophageal stricture   . GERD   . HYPERLIPIDEMIA   . HYPERTENSION   . Internal hemorrhoids with complication   . OSTEOPOROSIS   . OVERACTIVE BLADDER   . Pulmonary embolism ([place]) 2008  . PULMONARY HYPERTENSION, SECONDARY   . Stroke ([place])   . Tubular adenoma of colon 10/2013  . Unspecified hearing loss    Past Surgical History:  Procedure Laterality Date  . ABDOMINAL HYSTERECTOMY  1965  . APPENDECTOMY  1963  . CHOLECYSTECTOMY  1963  . COLONOSCOPY N/A 10/19/2013   Procedure: COLONOSCOPY;  Surgeon: Ladene Artist, MD;  Location: [facility];  Service: Endoscopy;  Laterality: N/A;  . EGD  1999  . PANCREATIC CYST DRAINAGE     x 2  . s/p sinus surgury    . TONSILLECTOMY      reports that she has never smoked. She has never used smokeless tobacco. She reports that she does not drink alcohol or use drugs. family history includes Breast cancer in her daughter; Emphysema in her father; Heart attack in her mother; Heart disease in her mother; Lymphoma in her sister; Stroke in her father. Allergies  Allergen Reactions  . Amoxicillin-Pot Clavulanate     REACTION: diarrhea Has patient had a  PCN reaction causing immediate rash, facial/tongue/throat swelling, SOB or lightheadedness with hypotension: unknown Has patient had a PCN reaction causing severe rash involving mucus membranes or skin necrosis: unknown Has patient had a PCN reaction that required hospitalization : unknown Has patient had a PCN reaction occurring within the last 10 years: yes   pt states she should be able to take penicillin, but cant remember actually taking it   . Ciclesonide     REACTION: bad smell  . Ciprofloxacin Other (See Comments)    dizziness  . Raloxifene     REACTION: risk of recurrent stroke  . Sulfonamide Derivatives     REACTION: tongue swells   Current Outpatient Prescriptions on File Prior to Visit  Medication Sig Dispense Refill  . albuterol (ACCUNEB) 0.63 MG/3ML nebulizer solution Take 3 mLs (0.63 mg total) by nebulization every 6 (six) hours as needed for wheezing. 75 mL 12  . amLODipine (NORVASC) 5 MG tablet Take 5 mg by mouth daily.    . budesonide (RHINOCORT ALLERGY) 32 MCG/ACT nasal spray Place 1 spray into both nostrils daily as needed for rhinitis or allergies.     . Digestive Enzymes (PAPAYA AND ENZYMES) CHEW Chew 2 tablets by mouth 3 (three) times daily with meals as needed (digestion of big meals).     . loratadine (  CLARITIN) 10 MG tablet Take 10 mg by mouth daily.     [name] losartan (COZAAR) 100 MG tablet TAKE 1 TABLET EVERY DAY 90 tablet 1  . meclizine (ANTIVERT) 12.5 MG tablet Take 1 tablet (12.5 mg total) by mouth 3 (three) times daily as needed for dizziness. 40 tablet 3  . Multiple Vitamins-Minerals (MACUVITE) TABS Take 1 tablet by mouth daily.      . Probiotic Product (PROBIOTIC DAILY PO) Take 1 tablet by mouth daily.    . ranitidine (ZANTAC) 300 MG capsule Take 1 capsule (300 mg total) by mouth 2 (two) times daily. 60 capsule 11  . warfarin (COUMADIN) 5 MG tablet TAKE AS DIRECTED BY ANTICOAGULATION CLINIC 60 tablet 1   No current facility-administered medications on file  prior to visit.    Review of Systems  Constitutional: Negative for other unusual diaphoresis or sweats HENT: Negative for ear discharge or swelling Eyes: Negative for other worsening visual disturbances Respiratory: Negative for stridor or other swelling  Gastrointestinal: Negative for worsening distension or other blood Genitourinary: Negative for retention or other urinary change Musculoskeletal: Negative for other MSK pain or swelling Skin: Negative for color change or other new lesions Neurological: Negative for worsening tremors and other numbness  Psychiatric/Behavioral: Negative for worsening agitation or other fatigue All other system neg per pt    Objective:   Physical Exam BP (!) 142/82 (BP Location: Left Arm, Patient Position: Sitting, Cuff Size: Normal)   Pulse 84   Temp 98.3 F (36.8 C) (Oral)   Resp 18   Wt 148 lb 4 oz (67.2 kg)   SpO2 92%   BMI 24.67 kg/m  VS noted, mild ill Constitutional: Pt appears in NAD HENT: Head: NCAT.  Right Ear: External ear normal.  Left Ear: External ear normal.  Eyes: . Pupils are equal, round, and reactive to light. Conjunctivae and EOM are normal Nose: without d/c or deformity Neck: Neck supple. Gross normal ROM Cardiovascular: Normal rate and regular rhythm.   Pulmonary/Chest: Effort normal and breath sounds decreased right uppand lower without rales but with few isolated wheezing.  Neurological: Pt is alert. At baseline orientation, motor grossly intact Skin: Skin is warm. No rashes, other new lesions, no LE edema Psychiatric: Pt behavior is normal without agitation ] No other exam findings       Assessment & Plan:

## 2017-08-06 NOTE — Patient Instructions (Signed)
You had the steroid shot today  Please take all new medication as prescribed - the antibiotic, cough medicine if needed, and the prednisone  Please continue all other medications as before, and refills have been done if requested.  Please have the pharmacy call with any other refills you may need.  Please keep your appointments with your specialists as you may have planned     

## 2017-08-06 NOTE — Assessment & Plan Note (Signed)
Mild to mod, for antibx course,  to f/u any worsening symptoms or concerns 

## 2017-08-12 ENCOUNTER — Ambulatory Visit: Payer: Medicare Other

## 2017-08-14 ENCOUNTER — Ambulatory Visit (INDEPENDENT_AMBULATORY_CARE_PROVIDER_SITE_OTHER): Payer: Medicare Other | Admitting: General Practice

## 2017-08-14 DIAGNOSIS — Z7901 Long term (current) use of anticoagulants: Secondary | ICD-10-CM

## 2017-08-14 LAB — POCT INR: INR: 3

## 2017-08-14 NOTE — Progress Notes (Signed)
I agree with this plan.

## 2017-08-14 NOTE — Patient Instructions (Signed)
Pre visit review using our clinic review tool, if applicable. No additional management support is needed unless otherwise documented below in the visit note. 

## 2017-09-03 ENCOUNTER — Ambulatory Visit: Payer: Medicare Other | Admitting: Emergency Medicine

## 2017-09-03 DIAGNOSIS — M810 Age-related osteoporosis without current pathological fracture: Secondary | ICD-10-CM | POA: Diagnosis not present

## 2017-09-03 DIAGNOSIS — E559 Vitamin D deficiency, unspecified: Secondary | ICD-10-CM | POA: Diagnosis not present

## 2017-09-03 DIAGNOSIS — M65341 Trigger finger, right ring finger: Secondary | ICD-10-CM | POA: Diagnosis not present

## 2017-09-03 DIAGNOSIS — R5383 Other fatigue: Secondary | ICD-10-CM | POA: Diagnosis not present

## 2017-09-09 ENCOUNTER — Other Ambulatory Visit: Payer: Self-pay | Admitting: Internal Medicine

## 2017-09-11 ENCOUNTER — Ambulatory Visit: Payer: Medicare Other | Admitting: Emergency Medicine

## 2017-09-11 ENCOUNTER — Ambulatory Visit: Payer: Medicare Other

## 2017-09-11 ENCOUNTER — Ambulatory Visit (INDEPENDENT_AMBULATORY_CARE_PROVIDER_SITE_OTHER): Payer: Medicare Other | Admitting: General Practice

## 2017-09-11 DIAGNOSIS — Z7901 Long term (current) use of anticoagulants: Secondary | ICD-10-CM | POA: Diagnosis not present

## 2017-09-11 LAB — POCT INR: INR: 2.2

## 2017-09-11 NOTE — Patient Instructions (Signed)
Pre visit review using our clinic review tool, if applicable. No additional management support is needed unless otherwise documented below in the visit note.  Continue to take 1/2 tablet all days except 1 tablet on Mondays and Fridays.  Re-check in 6 weeks. 

## 2017-09-11 NOTE — Progress Notes (Signed)
I agree with this plan.

## 2017-09-25 DIAGNOSIS — M810 Age-related osteoporosis without current pathological fracture: Secondary | ICD-10-CM | POA: Diagnosis not present

## 2017-09-25 DIAGNOSIS — E559 Vitamin D deficiency, unspecified: Secondary | ICD-10-CM | POA: Diagnosis not present

## 2017-10-15 ENCOUNTER — Encounter: Payer: Self-pay | Admitting: Internal Medicine

## 2017-10-15 ENCOUNTER — Ambulatory Visit (INDEPENDENT_AMBULATORY_CARE_PROVIDER_SITE_OTHER): Payer: Medicare Other | Admitting: Internal Medicine

## 2017-10-15 ENCOUNTER — Other Ambulatory Visit (INDEPENDENT_AMBULATORY_CARE_PROVIDER_SITE_OTHER): Payer: Medicare Other

## 2017-10-15 VITALS — BP 134/84 | HR 85 | Temp 97.7°F | Ht 65.0 in | Wt 145.0 lb

## 2017-10-15 DIAGNOSIS — E785 Hyperlipidemia, unspecified: Secondary | ICD-10-CM

## 2017-10-15 DIAGNOSIS — R7302 Impaired glucose tolerance (oral): Secondary | ICD-10-CM

## 2017-10-15 DIAGNOSIS — I10 Essential (primary) hypertension: Secondary | ICD-10-CM

## 2017-10-15 DIAGNOSIS — E559 Vitamin D deficiency, unspecified: Secondary | ICD-10-CM | POA: Diagnosis not present

## 2017-10-15 DIAGNOSIS — R109 Unspecified abdominal pain: Secondary | ICD-10-CM | POA: Diagnosis not present

## 2017-10-15 DIAGNOSIS — E538 Deficiency of other specified B group vitamins: Secondary | ICD-10-CM

## 2017-10-15 DIAGNOSIS — K219 Gastro-esophageal reflux disease without esophagitis: Secondary | ICD-10-CM

## 2017-10-15 LAB — CBC WITH DIFFERENTIAL/PLATELET
BASOS PCT: 0.2 % (ref 0.0–3.0)
Basophils Absolute: 0 10*3/uL (ref 0.0–0.1)
EOS PCT: 0.3 % (ref 0.0–5.0)
Eosinophils Absolute: 0 10*3/uL (ref 0.0–0.7)
HEMATOCRIT: 44.4 % (ref 36.0–46.0)
HEMOGLOBIN: 14.6 g/dL (ref 12.0–15.0)
LYMPHS PCT: 18.2 % (ref 12.0–46.0)
Lymphs Abs: 1.9 10*3/uL (ref 0.7–4.0)
MCHC: 32.9 g/dL (ref 30.0–36.0)
MCV: 96.8 fl (ref 78.0–100.0)
Monocytes Absolute: 0.9 10*3/uL (ref 0.1–1.0)
Monocytes Relative: 8.3 % (ref 3.0–12.0)
NEUTROS ABS: 7.6 10*3/uL (ref 1.4–7.7)
Neutrophils Relative %: 73 % (ref 43.0–77.0)
PLATELETS: 292 10*3/uL (ref 150.0–400.0)
RBC: 4.59 Mil/uL (ref 3.87–5.11)
RDW: 13.4 % (ref 11.5–15.5)
WBC: 10.3 10*3/uL (ref 4.0–10.5)

## 2017-10-15 LAB — URINALYSIS, ROUTINE W REFLEX MICROSCOPIC
Bilirubin Urine: NEGATIVE
Nitrite: NEGATIVE
PH: 5.5 (ref 5.0–8.0)
Total Protein, Urine: NEGATIVE
Urine Glucose: NEGATIVE
Urobilinogen, UA: 0.2 (ref 0.0–1.0)

## 2017-10-15 LAB — BASIC METABOLIC PANEL
BUN: 11 mg/dL (ref 6–23)
CHLORIDE: 98 meq/L (ref 96–112)
CO2: 29 mEq/L (ref 19–32)
Calcium: 9.2 mg/dL (ref 8.4–10.5)
Creatinine, Ser: 0.5 mg/dL (ref 0.40–1.20)
GFR: 124.48 mL/min (ref >60.00)
GLUCOSE: 110 mg/dL — AB (ref 70–99)
POTASSIUM: 4 meq/L (ref 3.5–5.1)
Sodium: 133 mEq/L — ABNORMAL LOW (ref 135–145)

## 2017-10-15 LAB — LIPID PANEL
CHOLESTEROL: 207 mg/dL — AB (ref 0–200)
HDL: 71.2 mg/dL (ref >39.00)
LDL CALC: 116 mg/dL — AB (ref 0–99)
NONHDL: 135.72
Total CHOL/HDL Ratio: 3
Triglycerides: 100 mg/dL (ref 0.0–149.0)
VLDL: 20 mg/dL (ref 0.0–40.0)

## 2017-10-15 LAB — VITAMIN D 25 HYDROXY (VIT D DEFICIENCY, FRACTURES): VITD: 40.58 ng/mL (ref 30.00–100.00)

## 2017-10-15 LAB — HEMOGLOBIN A1C: Hgb A1c MFr Bld: 5.9 % (ref 4.6–6.5)

## 2017-10-15 LAB — HEPATIC FUNCTION PANEL
ALBUMIN: 4 g/dL (ref 3.5–5.2)
ALT: 17 U/L (ref 0–35)
AST: 20 U/L (ref 0–37)
Alkaline Phosphatase: 66 U/L (ref 39–117)
BILIRUBIN DIRECT: 0.1 mg/dL (ref 0.0–0.3)
TOTAL PROTEIN: 7.5 g/dL (ref 6.0–8.3)
Total Bilirubin: 0.7 mg/dL (ref 0.2–1.2)

## 2017-10-15 LAB — LIPASE: Lipase: 12 U/L (ref 11.0–59.0)

## 2017-10-15 LAB — TSH: TSH: 2.44 u[IU]/mL (ref 0.35–4.50)

## 2017-10-15 LAB — VITAMIN B12: VITAMIN B 12: 1308 pg/mL — AB (ref 211–911)

## 2017-10-15 NOTE — Progress Notes (Signed)
Subjective:    Patient ID: [name], female    DOB: Jul 31, 1930, 84 y.o.   MRN: [id number]  HPI  Here to f/u with c/o "just dont feel good" for several days with diffuse abd pain maybe worse to the lower mid abd, but Denies urinary symptoms such as dysuria, frequency, urgency, flank pain, hematuria or n/v, fever, chills.   Pt denies polydipsia, polyuria.  Does also have worsening reflux symptoms with some sour brash despite antireflux diet and zantac 300 bid.  Denies dysphagia, n/v, bowel change or blood.  Pt denies chest pain, increased sob or doe, wheezing, orthopnea, PND, increased LE swelling, palpitations, dizziness or syncope.  No recent worsening cough.  Wt Readings from Last 3 Encounters:  10/15/17 145 lb (65.8 kg)  08/06/17 148 lb 4 oz (67.2 kg)  06/03/17 147 lb 9.6 oz (67 kg)  Has DXA sched for later this month.   Past Medical History:  Diagnosis Date  . ALLERGIC RHINITIS   . ANXIETY   . Bronchiectasis 12/11/2011   CT chest dx  . CAROTID ARTERY STENOSIS, RIGHT   . Chronic idiopathic constipation   . Chronic rhinitis   . COMMON MIGRAINE   . Complication of anesthesia    dizzy  . COPD   . CORONARY ARTERY DISEASE   . CYST/PSEUDOCYST, PANCREAS   . DEPRESSION   . DIVERTICULOSIS, COLON   . DIZZINESS, CHRONIC   . Esophageal stricture   . GERD   . HYPERLIPIDEMIA   . HYPERTENSION   . Internal hemorrhoids with complication   . OSTEOPOROSIS   . OVERACTIVE BLADDER   . Pulmonary embolism ([place]) 2008  . PULMONARY HYPERTENSION, SECONDARY   . Stroke ([place])   . Tubular adenoma of colon 10/2013  . Unspecified hearing loss    Past Surgical History:  Procedure Laterality Date  . ABDOMINAL HYSTERECTOMY  1965  . APPENDECTOMY  1963  . CHOLECYSTECTOMY  1963  . COLONOSCOPY N/A 10/19/2013   Procedure: COLONOSCOPY;  Surgeon: Ladene Artist, MD;  Location: [facility];  Service: Endoscopy;  Laterality: N/A;  . EGD  1999  . PANCREATIC CYST DRAINAGE     x 2  . s/p sinus surgury      . TONSILLECTOMY      reports that  has never smoked. she has never used smokeless tobacco. She reports that she does not drink alcohol or use drugs. family history includes Breast cancer in her daughter; Emphysema in her father; Heart attack in her mother; Heart disease in her mother; Lymphoma in her sister; Stroke in her father. Allergies  Allergen Reactions  . Amoxicillin-Pot Clavulanate     REACTION: diarrhea Has patient had a PCN reaction causing immediate rash, facial/tongue/throat swelling, SOB or lightheadedness with hypotension: unknown Has patient had a PCN reaction causing severe rash involving mucus membranes or skin necrosis: unknown Has patient had a PCN reaction that required hospitalization : unknown Has patient had a PCN reaction occurring within the last 10 years: yes   pt states she should be able to take penicillin, but cant remember actually taking it   . Ciclesonide     REACTION: bad smell  . Ciprofloxacin Other (See Comments)    dizziness  . Raloxifene     REACTION: risk of recurrent stroke  . Sulfonamide Derivatives     REACTION: tongue swells   Current Outpatient Medications on File Prior to Visit  Medication Sig Dispense Refill  . albuterol (ACCUNEB) 0.63 MG/3ML nebulizer solution Take 3  mLs (0.63 mg total) by nebulization every 6 (six) hours as needed for wheezing. 75 mL 12  . amLODipine (NORVASC) 5 MG tablet TAKE 1 TABLET EVERY DAY 90 tablet 1  . budesonide (RHINOCORT ALLERGY) 32 MCG/ACT nasal spray Place 1 spray into both nostrils daily as needed for rhinitis or allergies.     . Digestive Enzymes (PAPAYA AND ENZYMES) CHEW Chew 2 tablets by mouth 3 (three) times daily with meals as needed (digestion of big meals).     . loratadine (CLARITIN) 10 MG tablet Take 10 mg by mouth daily.     [name] losartan (COZAAR) 100 MG tablet TAKE 1 TABLET EVERY DAY 90 tablet 1  . meclizine (ANTIVERT) 12.5 MG tablet Take 1 tablet (12.5 mg total) by mouth 3 (three) times daily as  needed for dizziness. 40 tablet 3  . Multiple Vitamins-Minerals (MACUVITE) TABS Take 1 tablet by mouth daily.      . predniSONE (DELTASONE) 10 MG tablet 3 tabs by mouth per day for 3 days,2tabs per day for 3 days,1tab per day for 3 days 18 tablet 0  . Probiotic Product (PROBIOTIC DAILY PO) Take 1 tablet by mouth daily.    . ranitidine (ZANTAC) 300 MG capsule Take 1 capsule (300 mg total) by mouth 2 (two) times daily. 60 capsule 11  . warfarin (COUMADIN) 5 MG tablet TAKE AS DIRECTED BY ANTICOAGULATION CLINIC 60 tablet 1   No current facility-administered medications on file prior to visit.    Review of Systems  Constitutional: Negative for other unusual diaphoresis or sweats HENT: Negative for ear discharge or swelling Eyes: Negative for other worsening visual disturbances Respiratory: Negative for stridor or other swelling  Gastrointestinal: Negative for worsening distension or other blood Genitourinary: Negative for retention or other urinary change Musculoskeletal: Negative for other MSK pain or swelling Skin: Negative for color change or other new lesions Neurological: Negative for worsening tremors and other numbness  Psychiatric/Behavioral: Negative for worsening agitation or other fatigue All other system neg per pt    Objective:   Physical Exam BP 134/84   Pulse 85   Temp 97.7 F (36.5 C) (Oral)   Ht 5\' 5"  (1.651 m)   Wt 145 lb (65.8 kg)   SpO2 91%   BMI 24.13 kg/m  VS noted,  Constitutional: Pt appears in NAD HENT: Head: NCAT.  Right Ear: External ear normal.  Left Ear: External ear normal.  Eyes: . Pupils are equal, round, and reactive to light. Conjunctivae and EOM are normal Nose: without d/c or deformity Neck: Neck supple. Gross normal ROM Cardiovascular: Normal rate and regular rhythm.   Pulmonary/Chest: Effort normal and breath sounds decreased without rales or wheezing.  Abd:  Soft, ND, + BS, no organomegaly with tender low mid abd without guarding or  rerbound Neurological: Pt is alert. At baseline orientation, motor grossly intact Skin: Skin is warm. No rashes, other new lesions, no LE edema Psychiatric: Pt behavior is normal without agitation  No other exam findings     Assessment & Plan:

## 2017-10-15 NOTE — Assessment & Plan Note (Signed)
For lower chol diet, for f/u LDL with labs today, goal < 100

## 2017-10-15 NOTE — Patient Instructions (Signed)
Please continue all other medications as before, and refills have been done if requested.  Please have the pharmacy call with any other refills you may need.  Please continue your efforts at being more active, low cholesterol diet, and weight control.  You are otherwise up to date with prevention measures today.  Please keep your appointments with your specialists as you may have planned  You will be contacted regarding the referral for: Gastroenterology  Please go to the LAB in the Basement (turn left off the elevator) for the tests to be done today  You will be contacted by phone if any changes need to be made immediately.  Otherwise, you will receive a letter about your results with an explanation, but please check with MyChart first.  Please remember to sign up for MyChart if you have not done so, as this will be important to you in the future with finding out test results, communicating by private email, and scheduling acute appointments online when needed.  Please return in 6 months, or sooner if needed 

## 2017-10-15 NOTE — Assessment & Plan Note (Signed)
Etiology unclear, cant r/o underlying subclinical UTI, for labs and UA as ordered,  to f/u any worsening symptoms or concerns

## 2017-10-15 NOTE — Assessment & Plan Note (Signed)
Uncontrolled, marked concerning to pt currently, not clear if represents all of her abd pain; will cont zantac 300 bid, and pt requests referral to GI - ? Need EGD

## 2017-10-15 NOTE — Assessment & Plan Note (Signed)
Mild, asympt, for a1c with labs 

## 2017-10-16 ENCOUNTER — Telehealth: Payer: Self-pay

## 2017-10-16 ENCOUNTER — Ambulatory Visit: Payer: Medicare Other | Admitting: Emergency Medicine

## 2017-10-16 ENCOUNTER — Other Ambulatory Visit: Payer: Self-pay | Admitting: Internal Medicine

## 2017-10-16 ENCOUNTER — Encounter: Payer: Self-pay | Admitting: Internal Medicine

## 2017-10-16 MED ORDER — NITROFURANTOIN MACROCRYSTAL 50 MG PO CAPS: 50.0000 mg | ORAL_CAPSULE | Freq: Two times a day (BID) | ORAL | 0 refills | Status: DC

## 2017-10-16 NOTE — Telephone Encounter (Signed)
-----   Message from [name], MD sent at 10/16/2017 12:52 PM EST ----- Letter sent, cont same tx except  The test results show that your current treatment is OK, except there appears to be urinary tract infection.  We will send a prescription to your pharmacy, and you should hear from the office about this as well.Redmond Baseman to please inform pt, I will do rx

## 2017-10-16 NOTE — Telephone Encounter (Signed)
Pt has been informed and expressed understanding.  

## 2017-10-22 ENCOUNTER — Encounter: Payer: Self-pay | Admitting: Internal Medicine

## 2017-10-23 ENCOUNTER — Ambulatory Visit (INDEPENDENT_AMBULATORY_CARE_PROVIDER_SITE_OTHER): Payer: Medicare Other | Admitting: General Practice

## 2017-10-23 ENCOUNTER — Ambulatory Visit: Payer: Medicare Other | Admitting: Nurse Practitioner

## 2017-10-23 DIAGNOSIS — Z7901 Long term (current) use of anticoagulants: Secondary | ICD-10-CM | POA: Diagnosis not present

## 2017-10-23 LAB — POCT INR: INR: 2.7

## 2017-10-23 NOTE — Progress Notes (Signed)
I agree with this plan.

## 2017-10-23 NOTE — Patient Instructions (Addendum)
Pre visit review using our clinic review tool, if applicable. No additional management support is needed unless otherwise documented below in the visit note.  Continue to take 1/2 tablet all days except 1 tablet on Mondays and Fridays.  Re-check in 6 weeks. 

## 2017-11-06 ENCOUNTER — Ambulatory Visit: Payer: Medicare Other | Admitting: Emergency Medicine

## 2017-11-07 ENCOUNTER — Telehealth: Payer: Self-pay | Admitting: Internal Medicine

## 2017-11-07 MED ORDER — PROMETHAZINE HCL 25 MG PO TABS: 25.0000 mg | ORAL_TABLET | Freq: Four times a day (QID) | ORAL | 1 refills | Status: AC | PRN

## 2017-11-07 NOTE — Telephone Encounter (Signed)
Pt left me vm requesting a prescription for phenergan.  She states she is having problem with nausea and states Dr. [name] has prescribed before this before for her.  Please contact pt

## 2017-11-07 NOTE — Telephone Encounter (Signed)
Done erx 

## 2017-11-14 DIAGNOSIS — M810 Age-related osteoporosis without current pathological fracture: Secondary | ICD-10-CM | POA: Diagnosis not present

## 2017-11-20 ENCOUNTER — Other Ambulatory Visit: Payer: Medicare Other

## 2017-11-20 ENCOUNTER — Ambulatory Visit (INDEPENDENT_AMBULATORY_CARE_PROVIDER_SITE_OTHER): Payer: Medicare Other | Admitting: Internal Medicine

## 2017-11-20 ENCOUNTER — Encounter: Payer: Self-pay | Admitting: Internal Medicine

## 2017-11-20 VITALS — BP 140/86 | HR 83 | Temp 97.7°F | Ht 65.0 in | Wt 146.0 lb

## 2017-11-20 DIAGNOSIS — R05 Cough: Secondary | ICD-10-CM | POA: Diagnosis not present

## 2017-11-20 DIAGNOSIS — K862 Cyst of pancreas: Secondary | ICD-10-CM | POA: Diagnosis not present

## 2017-11-20 DIAGNOSIS — K863 Pseudocyst of pancreas: Secondary | ICD-10-CM | POA: Diagnosis not present

## 2017-11-20 DIAGNOSIS — R1030 Lower abdominal pain, unspecified: Secondary | ICD-10-CM | POA: Diagnosis not present

## 2017-11-20 DIAGNOSIS — R059 Cough, unspecified: Secondary | ICD-10-CM

## 2017-11-20 MED ORDER — DOXYCYCLINE HYCLATE 100 MG PO TABS: 100.0000 mg | ORAL_TABLET | Freq: Two times a day (BID) | ORAL | 0 refills | Status: DC

## 2017-11-20 NOTE — Assessment & Plan Note (Signed)
With hx of pancreatic cysts, UTI and constipation, for UA with labs today

## 2017-11-20 NOTE — Assessment & Plan Note (Signed)
Mild to mod, for antibx course,  to f/u any worsening symptoms or concerns 

## 2017-11-20 NOTE — Assessment & Plan Note (Signed)
[place] for MRI abd f/u cyst

## 2017-11-20 NOTE — Progress Notes (Signed)
Subjective:    Patient ID: [name], female    DOB: December 05, 1929, 84 y.o.   MRN: [id number]  HPI  Here with acute onset mild to mod 3 days ST, HA, general weakness and malaise, with scant prod cough greenish sputum, but Pt denies chest pain, increased sob or doe, wheezing, orthopnea, PND, increased LE swelling, palpitations, dizziness or syncope. Also  here with recurring nausea, and reflux and consipation - has seen GI/Dr Fuller Plan with constipation but seems to have diarrhea fairly regularly every few days with using the miralax, and very concerned about the zantac 300 bid being too much, hard to be specific but just "didn't feel good" with twice perday, so only really taking at night, along with probiotic and water.     Wt Readings from Last 3 Encounters:  11/20/17 146 lb (66.2 kg)  10/15/17 145 lb (65.8 kg)  08/06/17 148 lb 4 oz (67.2 kg)  Usual wt has been about 165 a few yrs ago per pt.  Still with recurring abd pain, asking for f/u evaluation of the pancreas cysts.  Denies worsening dysphagia, or blood. No other interval hx or specific complaints Denies urinary symptoms such as dysuria, frequency, urgency, flank pain, hematuria or n/v, fever, chills. Past Medical History:  Diagnosis Date  . ALLERGIC RHINITIS   . ANXIETY   . Bronchiectasis 12/11/2011   CT chest dx  . CAROTID ARTERY STENOSIS, RIGHT   . Chronic idiopathic constipation   . Chronic rhinitis   . COMMON MIGRAINE   . Complication of anesthesia    dizzy  . COPD   . CORONARY ARTERY DISEASE   . CYST/PSEUDOCYST, PANCREAS   . DEPRESSION   . DIVERTICULOSIS, COLON   . DIZZINESS, CHRONIC   . Esophageal stricture   . GERD   . HYPERLIPIDEMIA   . HYPERTENSION   . Internal hemorrhoids with complication   . OSTEOPOROSIS   . OVERACTIVE BLADDER   . Pulmonary embolism ([place]) 2008  . PULMONARY HYPERTENSION, SECONDARY   . Stroke ([place])   . Tubular adenoma of colon 10/2013  . Unspecified hearing loss    Past Surgical History:    Procedure Laterality Date  . ABDOMINAL HYSTERECTOMY  1965  . APPENDECTOMY  1963  . CHOLECYSTECTOMY  1963  . COLONOSCOPY N/A 10/19/2013   Procedure: COLONOSCOPY;  Surgeon: Ladene Artist, MD;  Location: [facility];  Service: Endoscopy;  Laterality: N/A;  . EGD  1999  . PANCREATIC CYST DRAINAGE     x 2  . s/p sinus surgury    . TONSILLECTOMY      reports that  has never smoked. she has never used smokeless tobacco. She reports that she does not drink alcohol or use drugs. family history includes Breast cancer in her daughter; Emphysema in her father; Heart attack in her mother; Heart disease in her mother; Lymphoma in her sister; Stroke in her father. Allergies  Allergen Reactions  . Amoxicillin-Pot Clavulanate     REACTION: diarrhea Has patient had a PCN reaction causing immediate rash, facial/tongue/throat swelling, SOB or lightheadedness with hypotension: unknown Has patient had a PCN reaction causing severe rash involving mucus membranes or skin necrosis: unknown Has patient had a PCN reaction that required hospitalization : unknown Has patient had a PCN reaction occurring within the last 10 years: yes   pt states she should be able to take penicillin, but cant remember actually taking it   . Ciclesonide     REACTION: bad smell  .  Ciprofloxacin Other (See Comments)    dizziness  . Raloxifene     REACTION: risk of recurrent stroke  . Sulfonamide Derivatives     REACTION: tongue swells   Current Outpatient Medications on File Prior to Visit  Medication Sig Dispense Refill  . albuterol (ACCUNEB) 0.63 MG/3ML nebulizer solution Take 3 mLs (0.63 mg total) by nebulization every 6 (six) hours as needed for wheezing. 75 mL 12  . amLODipine (NORVASC) 5 MG tablet TAKE 1 TABLET EVERY DAY 90 tablet 1  . budesonide (RHINOCORT ALLERGY) 32 MCG/ACT nasal spray Place 1 spray into both nostrils daily as needed for rhinitis or allergies.     . Digestive Enzymes (PAPAYA AND ENZYMES) CHEW Chew  2 tablets by mouth 3 (three) times daily with meals as needed (digestion of big meals).     . loratadine (CLARITIN) 10 MG tablet Take 10 mg by mouth daily.     [name] losartan (COZAAR) 100 MG tablet TAKE 1 TABLET EVERY DAY 90 tablet 1  . meclizine (ANTIVERT) 12.5 MG tablet Take 1 tablet (12.5 mg total) by mouth 3 (three) times daily as needed for dizziness. 40 tablet 3  . Multiple Vitamins-Minerals (MACUVITE) TABS Take 1 tablet by mouth daily.      . Probiotic Product (PROBIOTIC DAILY PO) Take 1 tablet by mouth daily.    . promethazine (PHENERGAN) 25 MG tablet Take 1 tablet (25 mg total) by mouth every 6 (six) hours as needed for nausea or vomiting. 30 tablet 1  . ranitidine (ZANTAC) 300 MG capsule Take 1 capsule (300 mg total) by mouth 2 (two) times daily. 60 capsule 11  . warfarin (COUMADIN) 5 MG tablet TAKE AS DIRECTED BY ANTICOAGULATION CLINIC 60 tablet 1   No current facility-administered medications on file prior to visit.    Review of Systems  Constitutional: Negative for other unusual diaphoresis or sweats HENT: Negative for ear discharge or swelling Eyes: Negative for other worsening visual disturbances Respiratory: Negative for stridor or other swelling  Gastrointestinal: Negative for worsening distension or other blood Genitourinary: Negative for retention or other urinary change Musculoskeletal: Negative for other MSK pain or swelling Skin: Negative for color change or other new lesions Neurological: Negative for worsening tremors and other numbness  Psychiatric/Behavioral: Negative for worsening agitation or other fatigue All other system neg per pt    Objective:   Physical Exam BP 140/86   Pulse 83   Temp 97.7 F (36.5 C) (Oral)   Ht 5\' 5"  (1.651 m)   Wt 146 lb (66.2 kg)   SpO2 92%   BMI 24.30 kg/m  VS noted, mild ill, chronically depressed Constitutional: Pt appears in NAD HENT: Head: NCAT.  Right Ear: External ear normal.  Left Ear: External ear normal.  Eyes: .  Pupils are equal, round, and reactive to light. Conjunctivae and EOM are normal Nose: without d/c or deformity Neck: Neck supple. Gross normal ROM Cardiovascular: Normal rate and regular rhythm.   Pulmonary/Chest: Effort normal and breath sounds decreased without specific rales or wheezing.  Abd:  Soft, NT, ND, + BS, no organomegaly - benign exam except for mild low mid abd tender Neurological: Pt is alert. At baseline orientation, motor grossly intact Skin: Skin is warm. No rashes, other new lesions, no LE edema Psychiatric: Pt behavior is normal without agitation  No other exam findings    Assessment & Plan:

## 2017-11-20 NOTE — Patient Instructions (Signed)
Please take all new medication as prescribed - the antibiotic  You will be contacted regarding the referral for: MRI  Please continue all other medications as before, and refills have been done if requested.  Please have the pharmacy call with any other refills you may need.  Please keep your appointments with your specialists as you may have planned  Please go to the LAB in the Basement (turn left off the elevator) for the tests to be done today - just the urine testing today  You will be contacted by phone if any changes need to be made immediately.  Otherwise, you will receive a letter about your results with an explanation, but please check with MyChart first.  Please remember to sign up for MyChart if you have not done so, as this will be important to you in the future with finding out test results, communicating by private email, and scheduling acute appointments online when needed.

## 2017-11-21 ENCOUNTER — Other Ambulatory Visit (INDEPENDENT_AMBULATORY_CARE_PROVIDER_SITE_OTHER): Payer: Medicare Other

## 2017-11-21 DIAGNOSIS — R1030 Lower abdominal pain, unspecified: Secondary | ICD-10-CM

## 2017-11-21 LAB — URINALYSIS, ROUTINE W REFLEX MICROSCOPIC
BILIRUBIN URINE: NEGATIVE
HGB URINE DIPSTICK: NEGATIVE
Ketones, ur: NEGATIVE
LEUKOCYTES UA: NEGATIVE
NITRITE: NEGATIVE
RBC / HPF: NONE SEEN (ref 0–?)
Specific Gravity, Urine: 1.015 (ref 1.000–1.030)
TOTAL PROTEIN, URINE-UPE24: NEGATIVE
Urine Glucose: NEGATIVE
Urobilinogen, UA: 0.2 (ref 0.0–1.0)
pH: 8 (ref 5.0–8.0)

## 2017-11-22 LAB — URINE CULTURE
MICRO NUMBER:: 90199815
SPECIMEN QUALITY:: ADEQUATE

## 2017-11-23 ENCOUNTER — Encounter: Payer: Self-pay | Admitting: Internal Medicine

## 2017-12-04 ENCOUNTER — Ambulatory Visit (INDEPENDENT_AMBULATORY_CARE_PROVIDER_SITE_OTHER): Payer: Medicare Other | Admitting: General Practice

## 2017-12-04 DIAGNOSIS — Z7901 Long term (current) use of anticoagulants: Secondary | ICD-10-CM

## 2017-12-04 LAB — POCT INR: INR: 1.7

## 2017-12-04 NOTE — Patient Instructions (Signed)
Pre visit review using our clinic review tool, if applicable. No additional management support is needed unless otherwise documented below in the visit note.  Take 1 tablet (5 mg) today, 2/27 and then continue to take 1/2 tablet all days except 1 tablet on Mondays and Fridays.  Re-check in 5 weeks.

## 2017-12-04 NOTE — Progress Notes (Signed)
I have reviewed and agree with this plan  

## 2017-12-11 ENCOUNTER — Other Ambulatory Visit: Payer: Medicare Other

## 2017-12-11 ENCOUNTER — Ambulatory Visit
Admission: RE | Admit: 2017-12-11 | Discharge: 2017-12-11 | Disposition: A | Discharge status: Home / Self Care | Payer: Medicare Other | Source: Ambulatory Visit | Attending: Internal Medicine | Admitting: Internal Medicine

## 2017-12-11 DIAGNOSIS — K862 Cyst of pancreas: Secondary | ICD-10-CM

## 2017-12-11 MED ORDER — GADOBENATE DIMEGLUMINE 529 MG/ML IV SOLN
14.0000 mL | Freq: Once | INTRAVENOUS | Status: AC | PRN
  Administered 2017-12-11: 14 mL via INTRAVENOUS

## 2017-12-12 ENCOUNTER — Telehealth: Payer: Self-pay | Admitting: Gastroenterology

## 2017-12-12 NOTE — Telephone Encounter (Signed)
  Patient was to have hemorrhoid banding with Dr. Carlean Purl last summer.  She is on coumadin and is scehduled with you for 4/18 and 5/2.  Previous instructions were to hold coumadin 1 day prior and for 3 days after.  Is this still ok to give her instructions?  Dr. [name] approved hold in August.

## 2017-12-12 NOTE — Telephone Encounter (Signed)
Yes same Coumadin instructions.

## 2017-12-12 NOTE — Telephone Encounter (Signed)
Patient notified that she will hold coumadin on 01/23/18.  She will call back for any additional questions or concerns.

## 2017-12-13 ENCOUNTER — Encounter: Payer: Self-pay | Admitting: Internal Medicine

## 2017-12-17 ENCOUNTER — Telehealth: Payer: Self-pay | Admitting: Internal Medicine

## 2017-12-17 NOTE — Telephone Encounter (Signed)
Pt left a msg on my vm requesting results to her MRI.  Can you please give the pt a call  thanks

## 2017-12-17 NOTE — Telephone Encounter (Signed)
Message was left on mychart, not sure why pt hasnt been able to see this  MRI shows no change in the cyst at the Head of the pancreas and it is felt not necessary to follow this further.  Also, however, there is a new Very Small cyst in the Tail of the pancreas, and a recommendation was made by the radiologist to consider f/u MRI at 24 months

## 2017-12-17 NOTE — Telephone Encounter (Signed)
Dr. [name] please provide result interpretation. Thank you.

## 2017-12-18 NOTE — Telephone Encounter (Signed)
Pt has been informed and expressed understanding.  

## 2018-01-08 ENCOUNTER — Ambulatory Visit (INDEPENDENT_AMBULATORY_CARE_PROVIDER_SITE_OTHER): Payer: Medicare Other | Admitting: General Practice

## 2018-01-08 ENCOUNTER — Other Ambulatory Visit: Payer: Self-pay | Admitting: Internal Medicine

## 2018-01-08 DIAGNOSIS — Z7901 Long term (current) use of anticoagulants: Secondary | ICD-10-CM

## 2018-01-08 DIAGNOSIS — I2699 Other pulmonary embolism without acute cor pulmonale: Secondary | ICD-10-CM

## 2018-01-08 LAB — POCT INR: INR: 2.7

## 2018-01-08 NOTE — Patient Instructions (Addendum)
Pre visit review using our clinic review tool, if applicable. No additional management support is needed unless otherwise documented below in the visit note.  Continue to take 1/2 tablet all days except 1 tablet on Mondays and Fridays.  Re-check in 5 weeks.

## 2018-01-20 ENCOUNTER — Other Ambulatory Visit: Payer: Self-pay | Admitting: Internal Medicine

## 2018-01-23 ENCOUNTER — Ambulatory Visit (INDEPENDENT_AMBULATORY_CARE_PROVIDER_SITE_OTHER): Payer: Medicare Other | Admitting: Gastroenterology

## 2018-01-23 ENCOUNTER — Encounter: Payer: Self-pay | Admitting: Gastroenterology

## 2018-01-23 ENCOUNTER — Telehealth: Payer: Self-pay | Admitting: General Practice

## 2018-01-23 VITALS — BP 124/60 | HR 82 | Ht 65.0 in | Wt 144.0 lb

## 2018-01-23 DIAGNOSIS — K648 Other hemorrhoids: Secondary | ICD-10-CM

## 2018-01-23 NOTE — Patient Instructions (Addendum)
You have been given a handout on fiber supplements. Please take 2 tablespoons of Benefiber daily.   RESUME COUMADIN TODAY AND DO NOT NOT HOLD COUMADIN FOR NEXT HEMORRHOID BANDING    HEMORRHOID BANDING PROCEDURE    FOLLOW-UP CARE   1. The procedure you have had should have been relatively painless since the banding of the area involved does not have nerve endings and there is no pain sensation.  The rubber band cuts off the blood supply to the hemorrhoid and the band may fall off as soon as 48 hours after the banding (the band may occasionally be seen in the toilet bowl following a bowel movement). You may notice a temporary feeling of fullness in the rectum which should respond adequately to plain Tylenol or Motrin.  2. Following the banding, avoid strenuous exercise that evening and resume full activity the next day.  A sitz bath (soaking in a warm tub) or bidet is soothing, and can be useful for cleansing the area after bowel movements.     3. To avoid constipation, take two tablespoons of natural wheat bran, natural oat bran, flax, Benefiber or any over the counter fiber supplement and increase your water intake to 7-8 glasses daily.    4. Unless you have been prescribed anorectal medication, do not put anything inside your rectum for two weeks: No suppositories, enemas, fingers, etc.  5. Occasionally, you may have more bleeding than usual after the banding procedure.  This is often from the untreated hemorrhoids rather than the treated one.  Don't be concerned if there is a tablespoon or so of blood.  If there is more blood than this, lie flat with your bottom higher than your head and apply an ice pack to the area. If the bleeding does not stop within a half an hour or if you feel faint, call our office at [phone] or go to the emergency room.  6. Problems are not common; however, if there is a substantial amount of bleeding, severe pain, chills, fever or difficulty passing urine  (very rare) or other problems, you should call us at [phone] or report to the nearest emergency room.  7. Do not stay seated continuously for more than 2-3 hours for a day or two after the procedure.  Tighten your buttock muscles 10-15 times every two hours and take 10-15 deep breaths every 1-2 hours.  Do not spend more than a few minutes on the toilet if you cannot empty your bowel; instead re-visit the toilet at a later time.

## 2018-01-23 NOTE — Telephone Encounter (Signed)
Left message for patient to take 1 1/2 coumadin tablets on Sunday and Monday and then on Tuesday resume current dosage.

## 2018-01-23 NOTE — Progress Notes (Signed)
PROCEDURE NOTE:  Dr. Bradley Ferris was present for the entire procedure. He instructed and assisted.   The patient presents with symptomatic bleeding grade III hemorrhoids, requesting rubber band ligation of her hemorrhoidal disease.  All risks, benefits and alternative forms of therapy were described and informed consent was obtained. Coumadin was held 2 days prior to the procedure.  She understood increased risk of bleeding on anticoagulation and consented to proceed.  In the Left Lateral Decubitus the anorectum was premedicated with 0.125% nitroglycerin gel and 5% lidocaine gel position. Anoscopic examination revealed grade III hemorrhoids in the RP, RA, LL positions. Decreased resting sphincter tone and reduced anal squeeze noted.   The decision was made to band the RP internal hemorrhoid, and the [place] was used to perform band ligation without complication.  Digital anorectal examination was then performed to assure proper positioning of the band, and to adjust the banded tissue as required. No complications were encountered and the patient tolerated the procedure well. The patient was discharged home without pain or other issues.  Dietary and behavioral recommendations were given and along with follow-up instructions.     The following adjunctive treatments were recommended:  Resume Coumadin today  Benefiber TID Continue Miralax daily Return in 2-3 weeks for follow-up and possible additional banding as required.

## 2018-01-24 ENCOUNTER — Telehealth: Payer: Self-pay | Admitting: Nurse Practitioner

## 2018-01-24 NOTE — Telephone Encounter (Signed)
Banding reports are under "procedure visit" in encounters

## 2018-01-24 NOTE — Telephone Encounter (Signed)
Patient called, said she had hemorrhoids banded yesterday in office (I don't see note yet). She is having lower abdominal cramping this am. No fevers, no bleeding. Thinks she may be constipated.   Recommended patient take Miralax now. If cramping persists, if develops bleeding, fever or worsening pain then call us back ASAP.

## 2018-01-28 ENCOUNTER — Ambulatory Visit: Payer: Medicare Other

## 2018-01-30 ENCOUNTER — Ambulatory Visit: Payer: Medicare Other | Admitting: Internal Medicine

## 2018-02-06 ENCOUNTER — Encounter: Payer: Self-pay | Admitting: Gastroenterology

## 2018-02-06 ENCOUNTER — Ambulatory Visit (INDEPENDENT_AMBULATORY_CARE_PROVIDER_SITE_OTHER): Payer: Medicare Other | Admitting: Gastroenterology

## 2018-02-06 VITALS — BP 142/74 | HR 70 | Ht 65.0 in | Wt 144.2 lb

## 2018-02-06 DIAGNOSIS — K642 Third degree hemorrhoids: Secondary | ICD-10-CM

## 2018-02-06 NOTE — Progress Notes (Signed)
PROCEDURE NOTE:  Procedure performed with Margarette Canada, RN from [facility] observing, instructing.   The patient presents with symptomatic grade 3 hemorrhoids, requesting rubber band ligation of her hemorrhoidal disease. Bleeding and prolapse have significantly improved following first banding. All risks, benefits and alternative forms of therapy were described and informed consent was obtained. Pt on Coumadin and she understood her increased risk for bleeding.   The anorectum was pre-medicated with 0.125 % lidocaine and 5% lidocaine. DRE revealing decreased sphincter tone and reduced anal squeeze. The resting sphincter tone was improved compared to 4/18 DRE.  The decision was made to band the RA internal hemorrhoid, and the [place] was used to perform band ligation without complication.  Digital anorectal examination was then performed to assure proper positioning of the band, and the banded tissue was adjusted 2 times due to mild, persistent rectal discomfort. A small amount of heme was noted on the exam glove at the 2nd adjustment. Discomfort persisted after 2 band adjustments so the band was removed with relief of discomfort.   The patient was discharged home without pain or other issues.  Dietary and behavioral recommendations were given and along with follow-up instructions.     The following adjunctive treatments were recommended:  The patient will return in 2 weeks for follow-up and possible additional banding as required. Consider LL banding at next visit. Consider repeat attempt at RA banding in future if symptoms persist.   No complications were encountered and the patient tolerated the procedure well.

## 2018-02-06 NOTE — Patient Instructions (Addendum)
Take Tylenol as needed.

## 2018-02-12 ENCOUNTER — Ambulatory Visit: Payer: Medicare Other

## 2018-02-12 ENCOUNTER — Ambulatory Visit (INDEPENDENT_AMBULATORY_CARE_PROVIDER_SITE_OTHER): Payer: Medicare Other | Admitting: General Practice

## 2018-02-12 DIAGNOSIS — Z7901 Long term (current) use of anticoagulants: Secondary | ICD-10-CM

## 2018-02-12 LAB — POCT INR: INR: 3.8

## 2018-02-12 NOTE — Patient Instructions (Addendum)
Pre visit review using our clinic review tool, if applicable. No additional management support is needed unless otherwise documented below in the visit note.  Skip coumadin today (5/8) and then change and take 1/2 tablet all days except 1 tablet on Mondays.  Re-check in 4 weeks.

## 2018-02-13 NOTE — Progress Notes (Signed)
I have reviewed and agree with this plan  

## 2018-02-27 DIAGNOSIS — M810 Age-related osteoporosis without current pathological fracture: Secondary | ICD-10-CM | POA: Diagnosis not present

## 2018-02-27 DIAGNOSIS — R5383 Other fatigue: Secondary | ICD-10-CM | POA: Diagnosis not present

## 2018-02-27 DIAGNOSIS — E559 Vitamin D deficiency, unspecified: Secondary | ICD-10-CM | POA: Diagnosis not present

## 2018-02-28 ENCOUNTER — Encounter: Payer: Medicare Other | Admitting: Gastroenterology

## 2018-03-12 ENCOUNTER — Encounter

## 2018-03-12 ENCOUNTER — Encounter: Payer: Medicare Other | Admitting: Gastroenterology

## 2018-03-12 ENCOUNTER — Ambulatory Visit: Payer: Medicare Other

## 2018-03-12 ENCOUNTER — Ambulatory Visit: Payer: Medicare Other | Admitting: Internal Medicine

## 2018-03-13 ENCOUNTER — Ambulatory Visit (INDEPENDENT_AMBULATORY_CARE_PROVIDER_SITE_OTHER): Payer: Medicare Other | Admitting: Internal Medicine

## 2018-03-13 ENCOUNTER — Encounter: Payer: Self-pay | Admitting: Internal Medicine

## 2018-03-13 VITALS — BP 144/86 | HR 88 | Temp 98.6°F | Ht 65.0 in | Wt 143.0 lb

## 2018-03-13 DIAGNOSIS — J449 Chronic obstructive pulmonary disease, unspecified: Secondary | ICD-10-CM | POA: Diagnosis not present

## 2018-03-13 DIAGNOSIS — J069 Acute upper respiratory infection, unspecified: Secondary | ICD-10-CM

## 2018-03-13 DIAGNOSIS — I10 Essential (primary) hypertension: Secondary | ICD-10-CM

## 2018-03-13 DIAGNOSIS — J9611 Chronic respiratory failure with hypoxia: Secondary | ICD-10-CM

## 2018-03-13 MED ORDER — DOXYCYCLINE HYCLATE 100 MG PO TABS: 100.0000 mg | ORAL_TABLET | Freq: Two times a day (BID) | ORAL | 0 refills | Status: DC

## 2018-03-13 NOTE — Progress Notes (Signed)
Subjective:    Patient ID: [name], female    DOB: Oct 13, 1929, 84 y.o.   MRN: [id number]  HPI   Here with 2-3 days acute onset fever, facial pain, pressure, headache, general weakness and malaise, and greenish d/c, with mild ST and cough, but pt denies chest pain, wheezing, increased sob or doe, orthopnea, PND, increased LE swelling, palpitations, dizziness or syncope.  Pt denies new neurological symptoms such as new headache, or facial or extremity weakness or numbness   Pt denies polydipsia, polyuria Past Medical History:  Diagnosis Date  . ALLERGIC RHINITIS   . ANXIETY   . Bronchiectasis 12/11/2011   CT chest dx  . CAROTID ARTERY STENOSIS, RIGHT   . Chronic idiopathic constipation   . Chronic rhinitis   . COMMON MIGRAINE   . Complication of anesthesia    dizzy  . COPD   . CORONARY ARTERY DISEASE   . CYST/PSEUDOCYST, PANCREAS   . DEPRESSION   . DIVERTICULOSIS, COLON   . DIZZINESS, CHRONIC   . Esophageal stricture   . GERD   . HYPERLIPIDEMIA   . HYPERTENSION   . Internal hemorrhoids with complication   . OSTEOPOROSIS   . OVERACTIVE BLADDER   . Pulmonary embolism ([place]) 2008  . PULMONARY HYPERTENSION, SECONDARY   . Stroke ([place])   . Tubular adenoma of colon 10/2013  . Unspecified hearing loss    Past Surgical History:  Procedure Laterality Date  . ABDOMINAL HYSTERECTOMY  1965  . APPENDECTOMY  1963  . CHOLECYSTECTOMY  1963  . COLONOSCOPY N/A 10/19/2013   Procedure: COLONOSCOPY;  Surgeon: Ladene Artist, MD;  Location: [facility];  Service: Endoscopy;  Laterality: N/A;  . EGD  1999  . PANCREATIC CYST DRAINAGE     x 2  . s/p sinus surgury    . TONSILLECTOMY      reports that she has never smoked. She has never used smokeless tobacco. She reports that she does not drink alcohol or use drugs. family history includes Breast cancer in her daughter; Emphysema in her father; Heart attack in her mother; Heart disease in her mother; Lymphoma in her sister; Stroke in her  father. Allergies  Allergen Reactions  . Amoxicillin-Pot Clavulanate     REACTION: diarrhea Has patient had a PCN reaction causing immediate rash, facial/tongue/throat swelling, SOB or lightheadedness with hypotension: unknown Has patient had a PCN reaction causing severe rash involving mucus membranes or skin necrosis: unknown Has patient had a PCN reaction that required hospitalization : unknown Has patient had a PCN reaction occurring within the last 10 years: yes   pt states she should be able to take penicillin, but cant remember actually taking it   . Ciclesonide     REACTION: bad smell  . Ciprofloxacin Other (See Comments)    dizziness  . Raloxifene     REACTION: risk of recurrent stroke  . Sulfonamide Derivatives     REACTION: tongue swells   Current Outpatient Medications on File Prior to Visit  Medication Sig Dispense Refill  . albuterol (ACCUNEB) 0.63 MG/3ML nebulizer solution Take 3 mLs (0.63 mg total) by nebulization every 6 (six) hours as needed for wheezing. 75 mL 12  . amLODipine (NORVASC) 5 MG tablet TAKE 1 TABLET EVERY DAY 90 tablet 1  . budesonide (RHINOCORT ALLERGY) 32 MCG/ACT nasal spray Place 1 spray into both nostrils daily as needed for rhinitis or allergies.     . Digestive Enzymes (PAPAYA AND ENZYMES) CHEW Chew 2 tablets by  mouth 3 (three) times daily with meals as needed (digestion of big meals).     . loratadine (CLARITIN) 10 MG tablet Take 10 mg by mouth daily.     [name] losartan (COZAAR) 100 MG tablet TAKE 1 TABLET EVERY DAY 90 tablet 1  . meclizine (ANTIVERT) 12.5 MG tablet Take 1 tablet (12.5 mg total) by mouth 3 (three) times daily as needed for dizziness. 40 tablet 3  . Multiple Vitamins-Minerals (MACUVITE) TABS Take 1 tablet by mouth daily.      . Probiotic Product (PROBIOTIC DAILY PO) Take 1 tablet by mouth daily.    . promethazine (PHENERGAN) 25 MG tablet Take 1 tablet (25 mg total) by mouth every 6 (six) hours as needed for nausea or vomiting. 30  tablet 1  . ranitidine (ZANTAC) 300 MG tablet Take 300 mg by mouth at bedtime.    . VENTOLIN HFA 108 (90 Base) MCG/ACT inhaler INHALE 2 PUFFS INTO THE LUNGS EVERY 6 HOURS AS NEEDED FOR WHEEZING OR SHORTNESS OF BREATH. 18 Inhaler 0  . warfarin (COUMADIN) 5 MG tablet TAKE AS DIRECTED BY ANTICOAGULATION CLINIC 60 tablet 1   No current facility-administered medications on file prior to visit.    Review of Systems  Constitutional: Negative for other unusual diaphoresis or sweats HENT: Negative for ear discharge or swelling Eyes: Negative for other worsening visual disturbances Respiratory: Negative for stridor or other swelling  Gastrointestinal: Negative for worsening distension or other blood Genitourinary: Negative for retention or other urinary change Musculoskeletal: Negative for other MSK pain or swelling Skin: Negative for color change or other new lesions Neurological: Negative for worsening tremors and other numbness  Psychiatric/Behavioral: Negative for worsening agitation or other fatigue All other system neg per pt    Objective:   Physical Exam BP (!) 144/86   Pulse 88   Temp 98.6 F (37 C) (Oral)   Ht 5\' 5"  (1.651 m)   Wt 143 lb (64.9 kg)   SpO2 94%   BMI 23.80 kg/m  VS noted, mild illl Constitutional: Pt appears in NAD HENT: Head: NCAT.  Right Ear: External ear normal.  Left Ear: External ear normal.  Eyes: . Pupils are equal, round, and reactive to light. Conjunctivae and EOM are normal Bilat tm's with mild erythema.  Max sinus areas mild tender.  Pharynx with mild erythema, no exudate Nose: without d/c or deformity Neck: Neck supple. Gross normal ROM Cardiovascular: Normal rate and regular rhythm.   Pulmonary/Chest: Effort normal and breath sounds without rales or wheezing.  Neurological: Pt is alert. At baseline orientation, motor grossly intact Skin: Skin is warm. No rashes, other new lesions, no LE edema Psychiatric: Pt behavior is normal without agitation    No other exam findings    Assessment & Plan:

## 2018-03-13 NOTE — Patient Instructions (Addendum)
Please take all new medication as prescribed - the antibiotic ? ?Please continue all other medications as before, and refills have been done if requested. ? ?Please have the pharmacy call with any other refills you may need. ? ?Please continue your efforts at being more active, low cholesterol diet, and weight control. ? ?You are otherwise up to date with prevention measures today. ? ?Please keep your appointments with your specialists as you may have planned ? ? ? ?

## 2018-03-16 NOTE — Assessment & Plan Note (Signed)
stable overall by history and exam, recent data reviewed with pt, and pt to continue medical treatment as before,  to f/u any worsening symptoms or concerns  

## 2018-03-16 NOTE — Assessment & Plan Note (Signed)
Mild increased today likely situational, o/w stable overall by history and exam, recent data reviewed with pt, and pt to continue medical treatment as before,  to f/u any worsening symptoms or concerns BP Readings from Last 3 Encounters:  03/13/18 (!) 144/86  02/06/18 (!) 142/74  01/23/18 124/60

## 2018-03-16 NOTE — Assessment & Plan Note (Signed)
Mild to mod, for antibx course,  to f/u any worsening symptoms or concerns 

## 2018-03-19 ENCOUNTER — Telehealth: Payer: Self-pay | Admitting: General Practice

## 2018-03-19 NOTE — Telephone Encounter (Signed)
Copied from [place] [phone]. Topic: Appointment Scheduling - Scheduling Inquiry for Clinic >> Mar 19, 2018  4:14 PM Mylinda Latina, NT wrote: Reason for CRM: Patient called and r/s her coumadin app. Patient had an appt on 03/12/18 to 03/24/18 at 1:45pm

## 2018-03-24 ENCOUNTER — Encounter: Payer: Self-pay | Admitting: Family Medicine

## 2018-03-24 ENCOUNTER — Ambulatory Visit (INDEPENDENT_AMBULATORY_CARE_PROVIDER_SITE_OTHER): Payer: Medicare Other | Admitting: General Practice

## 2018-03-24 ENCOUNTER — Ambulatory Visit (INDEPENDENT_AMBULATORY_CARE_PROVIDER_SITE_OTHER): Payer: Medicare Other | Admitting: Family Medicine

## 2018-03-24 VITALS — BP 134/68 | HR 68 | Temp 98.4°F | Resp 16 | Ht 65.0 in | Wt 144.2 lb

## 2018-03-24 DIAGNOSIS — J309 Allergic rhinitis, unspecified: Secondary | ICD-10-CM | POA: Diagnosis not present

## 2018-03-24 DIAGNOSIS — Z7901 Long term (current) use of anticoagulants: Secondary | ICD-10-CM

## 2018-03-24 DIAGNOSIS — H6982 Other specified disorders of Eustachian tube, left ear: Secondary | ICD-10-CM

## 2018-03-24 LAB — POCT INR: INR: 1.8 — AB (ref 2.0–3.0)

## 2018-03-24 NOTE — Patient Instructions (Addendum)
Pre visit review using our clinic review tool, if applicable. No additional management support is needed unless otherwise documented below in the visit note.  Take 1 1/2 tablets today and then continue to take 1/2 tablet all days except 1 tablet on Mondays.  Re-check in 4 weeks.

## 2018-03-24 NOTE — Progress Notes (Signed)
ACUTE VISIT   HPI:  Chief Complaint  Patient presents with  . Left ear pressure    causing her to feel nauseated, started over the weekend    [name] is a 84 y.o. female, who is here today complaining of 2 to 3 days of left ear pressure sensation, "stopped up",aching, and "thumping feeling." She states that pain is not bad.  It is associated with mild nausea. Left ear "cracking" sensation. Negative for tinnitus, ear drainage, chills, or fever. According to patient, recently treated with antibiotics.  She is reporting no improvement after completing Doxycycline.  Nonproductive cough and dry mouth. "Little" sore throat, mainly in the morning, postnasal drainage.  She has not identified exacerbating or alleviating factors. She has used nasal Afrin.  She has history of allergy rhinitis. She is currently on Rhinocort nasal spray and Claritin 10 mg daily. Also history of vertigo, she is on meclizine 12.5 mg daily as needed.  No recent travel.   Review of Systems  Constitutional: Negative for activity change, appetite change, fatigue and fever.  HENT: Positive for ear pain, postnasal drip and sore throat. Negative for ear discharge, hearing loss, mouth sores, sinus pressure, trouble swallowing and voice change.   Eyes: Negative for discharge and itching.  Respiratory: Positive for cough. Negative for shortness of breath and wheezing.   Gastrointestinal: Positive for nausea. Negative for abdominal pain, diarrhea and vomiting.  Skin: Negative for rash.  Allergic/Immunologic: Positive for environmental allergies.  Neurological: Positive for dizziness (Chronic, intermittently.). Negative for syncope, weakness and headaches.  Psychiatric/Behavioral: Negative for confusion. The patient is nervous/anxious.       Current Outpatient Medications on File Prior to Visit  Medication Sig Dispense Refill  . albuterol (ACCUNEB) 0.63 MG/3ML nebulizer solution Take 3 mLs  (0.63 mg total) by nebulization every 6 (six) hours as needed for wheezing. 75 mL 12  . amLODipine (NORVASC) 5 MG tablet TAKE 1 TABLET EVERY DAY 90 tablet 1  . budesonide (RHINOCORT ALLERGY) 32 MCG/ACT nasal spray Place 1 spray into both nostrils daily as needed for rhinitis or allergies.     . Digestive Enzymes (PAPAYA AND ENZYMES) CHEW Chew 2 tablets by mouth 3 (three) times daily with meals as needed (digestion of big meals).     . loratadine (CLARITIN) 10 MG tablet Take 10 mg by mouth daily.     [name] losartan (COZAAR) 100 MG tablet TAKE 1 TABLET EVERY DAY 90 tablet 1  . meclizine (ANTIVERT) 12.5 MG tablet Take 1 tablet (12.5 mg total) by mouth 3 (three) times daily as needed for dizziness. 40 tablet 3  . Multiple Vitamins-Minerals (MACUVITE) TABS Take 1 tablet by mouth daily.      . Probiotic Product (PROBIOTIC DAILY PO) Take 1 tablet by mouth daily.    . promethazine (PHENERGAN) 25 MG tablet Take 1 tablet (25 mg total) by mouth every 6 (six) hours as needed for nausea or vomiting. 30 tablet 1  . ranitidine (ZANTAC) 300 MG tablet Take 300 mg by mouth at bedtime.    . VENTOLIN HFA 108 (90 Base) MCG/ACT inhaler INHALE 2 PUFFS INTO THE LUNGS EVERY 6 HOURS AS NEEDED FOR WHEEZING OR SHORTNESS OF BREATH. 18 Inhaler 0  . warfarin (COUMADIN) 5 MG tablet TAKE AS DIRECTED BY ANTICOAGULATION CLINIC 60 tablet 1  . doxycycline (VIBRA-TABS) 100 MG tablet Take 1 tablet (100 mg total) by mouth 2 (two) times daily. (Patient not taking: Reported on 03/24/2018) 28 tablet 0  No current facility-administered medications on file prior to visit.      Past Medical History:  Diagnosis Date  . ALLERGIC RHINITIS   . ANXIETY   . Bronchiectasis 12/11/2011   CT chest dx  . CAROTID ARTERY STENOSIS, RIGHT   . Chronic idiopathic constipation   . Chronic rhinitis   . COMMON MIGRAINE   . Complication of anesthesia    dizzy  . COPD   . CORONARY ARTERY DISEASE   . CYST/PSEUDOCYST, PANCREAS   . DEPRESSION   .  DIVERTICULOSIS, COLON   . DIZZINESS, CHRONIC   . Esophageal stricture   . GERD   . HYPERLIPIDEMIA   . HYPERTENSION   . Internal hemorrhoids with complication   . OSTEOPOROSIS   . OVERACTIVE BLADDER   . Pulmonary embolism ([place]) 2008  . PULMONARY HYPERTENSION, SECONDARY   . Stroke ([place])   . Tubular adenoma of colon 10/2013  . Unspecified hearing loss    Allergies  Allergen Reactions  . Amoxicillin-Pot Clavulanate     REACTION: diarrhea Has patient had a PCN reaction causing immediate rash, facial/tongue/throat swelling, SOB or lightheadedness with hypotension: unknown Has patient had a PCN reaction causing severe rash involving mucus membranes or skin necrosis: unknown Has patient had a PCN reaction that required hospitalization : unknown Has patient had a PCN reaction occurring within the last 10 years: yes   pt states she should be able to take penicillin, but cant remember actually taking it   . Ciclesonide     REACTION: bad smell  . Ciprofloxacin Other (See Comments)    dizziness  . Raloxifene     REACTION: risk of recurrent stroke  . Sulfonamide Derivatives     REACTION: tongue swells    Social History   Socioeconomic History  . Marital status: Widowed    Spouse name: Not on file  . Number of children: 2  . Years of education: Not on file  . Highest education level: Not on file  Occupational History  . Occupation: retired    Fish farm manager: RETIRED  Social Needs  . Financial resource strain: Not on file  . Food insecurity:    Worry: Not on file    Inability: Not on file  . Transportation needs:    Medical: Not on file    Non-medical: Not on file  Tobacco Use  . Smoking status: Never Smoker  . Smokeless tobacco: Never Used  . Tobacco comment: spouse smoked in the home for 15 years  Substance and Sexual Activity  . Alcohol use: No  . Drug use: No  . Sexual activity: Not Currently  Lifestyle  . Physical activity:    Days per week: Not on file    Minutes per  session: Not on file  . Stress: Not on file  Relationships  . Social connections:    Talks on phone: Not on file    Gets together: Not on file    Attends religious service: Not on file    Active member of club or organization: Not on file    Attends meetings of clubs or organizations: Not on file    Relationship status: Not on file  Other Topics Concern  . Not on file  Social History Narrative   Drinks green tea    Vitals:   03/24/18 1421  BP: 134/68  Pulse: 68  Resp: 16  Temp: 98.4 F (36.9 C)  SpO2: 97%   Body mass index is 24 kg/m.   Physical Exam  Nursing note and vitals reviewed. Constitutional: She is oriented to person, place, and time. She appears well-developed and well-nourished. She does not appear ill. No distress.  HENT:  Head: Normocephalic and atraumatic.  Right Ear: Tympanic membrane, external ear and ear canal normal. No tenderness.  Left Ear: External ear and ear canal normal. No tenderness. Tympanic membrane is not erythematous and not bulging. A middle ear effusion is present.  Nose: Septal deviation present.  Mouth/Throat: Oropharynx is clear and moist and mucous membranes are normal.  Cerumen excess in ear canals, TMs seen partially.  Eyes: Pupils are equal, round, and reactive to light. Conjunctivae are normal.  Cardiovascular: Normal rate and regular rhythm.  No murmur heard. Respiratory: Effort normal and breath sounds normal. No respiratory distress.  Lymphadenopathy:    She has no cervical adenopathy.  Neurological: She is alert and oriented to person, place, and time. She has normal strength.  Mildly unstable gait with no assistance.  Skin: Skin is warm. No rash noted. No erythema.  Psychiatric: She has a normal mood and affect. Her speech is normal.  Well groomed, good eye contact.    ASSESSMENT AND PLAN:   Ms. Jyrah was seen today for left ear pressure.  Diagnoses and all orders for this visit:  Eustachian tube dysfunction,  left  History and findings on examination suggest eustachian tube dysfunction. We discussed clinical presentation and treatment options. Because age + Hx of PAD I do not recommend OTC decongestants. Auto inflation maneuvers a few times per day recommended. Instructed about warning signs. Follow-up in 2 to 3 weeks with PCP if she is not feeling any better.  Allergic rhinitis, unspecified seasonality, unspecified trigger  Continue Rhinocort nasal spray daily. No changes in Claritin 10 mg daily. Plain Mucinex may help.  Discouraged Afrin nasal usage. Saline nasal irrigations may also help.     Return if symptoms worsen or fail to improve.        Trevia Nop G. [place], MD  [facility] [place] office.

## 2018-03-24 NOTE — Patient Instructions (Signed)
A few things to remember from today's visit:   Allergic rhinitis, unspecified seasonality, unspecified trigger  Eustachian tube dysfunction, left Try to pop the ears a few times during the day. Continue Rhinocort. Avoid nasal Afrin. Follow with your doctor in 3 to 4 weeks if problem is persistent.   Eustachian Tube Dysfunction The eustachian tube connects the middle ear to the back of the nose. It regulates air pressure in the middle ear by allowing air to move between the ear and nose. It also helps to drain fluid from the middle ear space. When the eustachian tube does not function properly, air pressure, fluid, or both can build up in the middle ear. Eustachian tube dysfunction can affect one or both ears. What are the causes? This condition happens when the eustachian tube becomes blocked or cannot open normally. This may result from:  Ear infections.  Colds and other upper respiratory infections.  Allergies.  Irritation, such as from cigarette smoke or acid from the stomach coming up into the esophagus (gastroesophageal reflux).  Sudden changes in air pressure, such as from descending in an airplane.  Abnormal growths in the nose or throat, such as nasal polyps, tumors, or enlarged tissue at the back of the throat (adenoids).  What increases the risk? This condition may be more likely to develop in people who smoke and people who are overweight. Eustachian tube dysfunction may also be more likely to develop in children, especially children who have:  Certain birth defects of the mouth, such as cleft palate.  Large tonsils and adenoids.  What are the signs or symptoms? Symptoms of this condition may include:  A feeling of fullness in the ear.  Ear pain.  Clicking or popping noises in the ear.  Ringing in the ear.  Hearing loss.  Loss of balance.  Symptoms may get worse when the air pressure around you changes, such as when you travel to an area of high  elevation or fly on an airplane. How is this diagnosed? This condition may be diagnosed based on:  Your symptoms.  A physical exam of your ear, nose, and throat.  Tests, such as those that measure: ? The movement of your eardrum (tympanogram). ? Your hearing (audiometry).  How is this treated? Treatment depends on the cause and severity of your condition. If your symptoms are mild, you may be able to relieve your symptoms by moving air into ("popping") your ears. If you have symptoms of fluid in your ears, treatment may include:  Decongestants.  Antihistamines.  Nasal sprays or ear drops that contain medicines that reduce swelling (steroids).  In some cases, you may need to have a procedure to drain the fluid in your eardrum (myringotomy). In this procedure, a small tube is placed in the eardrum to:  Drain the fluid.  Restore the air in the middle ear space.  Follow these instructions at home:  Take over-the-counter and prescription medicines only as told by your health care provider.  Use techniques to help pop your ears as recommended by your health care provider. These may include: ? Chewing gum. ? Yawning. ? Frequent, forceful swallowing. ? Closing your mouth, holding your nose closed, and gently blowing as if you are trying to blow air out of your nose.  Do not do any of the following until your health care provider approves: ? Travel to high altitudes. ? Fly in airplanes. ? Work in a Pension scheme manager or room. ? Scuba dive.  Keep your ears dry.  Dry your ears completely after showering or bathing.  Do not smoke.  Keep all follow-up visits as told by your health care provider. This is important. Contact a health care provider if:  Your symptoms do not go away after treatment.  Your symptoms come back after treatment.  You are unable to pop your ears.  You have: ? A fever. ? Pain in your ear. ? Pain in your head or neck. ? Fluid draining from your  ear.  Your hearing suddenly changes.  You become very dizzy.  You lose your balance. This information is not intended to replace advice given to you by your health care provider. Make sure you discuss any questions you have with your health care provider. Document Released: 10/21/2015 Document Revised: 03/01/2016 Document Reviewed: 10/13/2014 Elsevier Interactive Patient Education  2018 [employer].  Please be sure medication list is accurate. If a new problem present, please set up appointment sooner than planned today.

## 2018-04-07 DIAGNOSIS — H35033 Hypertensive retinopathy, bilateral: Secondary | ICD-10-CM | POA: Diagnosis not present

## 2018-04-07 DIAGNOSIS — Z961 Presence of intraocular lens: Secondary | ICD-10-CM | POA: Diagnosis not present

## 2018-04-07 DIAGNOSIS — H35363 Drusen (degenerative) of macula, bilateral: Secondary | ICD-10-CM | POA: Diagnosis not present

## 2018-04-07 DIAGNOSIS — H353132 Nonexudative age-related macular degeneration, bilateral, intermediate dry stage: Secondary | ICD-10-CM | POA: Diagnosis not present

## 2018-04-15 ENCOUNTER — Ambulatory Visit: Payer: Medicare Other | Admitting: Internal Medicine

## 2018-04-21 ENCOUNTER — Telehealth: Payer: Self-pay | Admitting: Gastroenterology

## 2018-04-21 ENCOUNTER — Telehealth: Payer: Self-pay

## 2018-04-21 NOTE — Telephone Encounter (Signed)
Pt would like to be seen before August. States that her throat burns in the morning, has not been eating and has lost weight. First available with APP on 05/08/18. Pls call pt.

## 2018-04-21 NOTE — Telephone Encounter (Signed)
appt made with Ellouise Newer, PA for 05/08/18.  She is added to the wait list. She cut her zantac dose to 150 mg a day.  She is advised to return to 300 mg BID as ordered

## 2018-04-23 ENCOUNTER — Ambulatory Visit: Payer: Medicare Other | Admitting: Family

## 2018-04-23 ENCOUNTER — Ambulatory Visit (INDEPENDENT_AMBULATORY_CARE_PROVIDER_SITE_OTHER): Payer: Medicare Other | Admitting: General Practice

## 2018-04-23 DIAGNOSIS — Z7901 Long term (current) use of anticoagulants: Secondary | ICD-10-CM

## 2018-04-23 LAB — POCT INR: INR: 1.8 — AB (ref 2.0–3.0)

## 2018-04-23 NOTE — Patient Instructions (Addendum)
Pre visit review using our clinic review tool, if applicable. No additional management support is needed unless otherwise documented below in the visit note.  Take 1  tablet today and then change dosage and take 1/2 tablet all days except 1 tablet on Mondays and Fridays.  Re-check in 4 weeks.

## 2018-05-05 ENCOUNTER — Other Ambulatory Visit: Payer: Self-pay | Admitting: Internal Medicine

## 2018-05-06 ENCOUNTER — Other Ambulatory Visit: Payer: Self-pay | Admitting: General Practice

## 2018-05-06 MED ORDER — WARFARIN SODIUM 5 MG PO TABS: ORAL_TABLET | ORAL | 1 refills | Status: DC

## 2018-05-08 ENCOUNTER — Encounter: Payer: Self-pay | Admitting: Family Medicine

## 2018-05-08 ENCOUNTER — Other Ambulatory Visit: Payer: Self-pay | Admitting: Internal Medicine

## 2018-05-08 ENCOUNTER — Other Ambulatory Visit: Payer: Self-pay

## 2018-05-08 ENCOUNTER — Ambulatory Visit (INDEPENDENT_AMBULATORY_CARE_PROVIDER_SITE_OTHER): Payer: Medicare Other

## 2018-05-08 ENCOUNTER — Ambulatory Visit: Payer: Medicare Other | Admitting: Physician Assistant

## 2018-05-08 ENCOUNTER — Ambulatory Visit (INDEPENDENT_AMBULATORY_CARE_PROVIDER_SITE_OTHER): Payer: Medicare Other | Admitting: Family Medicine

## 2018-05-08 ENCOUNTER — Encounter

## 2018-05-08 VITALS — BP 140/76 | HR 86 | Temp 98.4°F | Resp 17 | Ht 65.0 in | Wt 145.4 lb

## 2018-05-08 DIAGNOSIS — Z8781 Personal history of (healed) traumatic fracture: Secondary | ICD-10-CM

## 2018-05-08 DIAGNOSIS — J849 Interstitial pulmonary disease, unspecified: Secondary | ICD-10-CM | POA: Diagnosis not present

## 2018-05-08 DIAGNOSIS — R0781 Pleurodynia: Secondary | ICD-10-CM | POA: Diagnosis not present

## 2018-05-08 DIAGNOSIS — M546 Pain in thoracic spine: Secondary | ICD-10-CM | POA: Diagnosis not present

## 2018-05-08 DIAGNOSIS — I270 Primary pulmonary hypertension: Secondary | ICD-10-CM | POA: Diagnosis not present

## 2018-05-08 DIAGNOSIS — M549 Dorsalgia, unspecified: Secondary | ICD-10-CM | POA: Diagnosis not present

## 2018-05-08 DIAGNOSIS — R109 Unspecified abdominal pain: Secondary | ICD-10-CM | POA: Diagnosis not present

## 2018-05-08 LAB — POCT URINALYSIS DIPSTICK
BILIRUBIN UA: NEGATIVE
GLUCOSE UA: NEGATIVE
Ketones, UA: NEGATIVE
Nitrite, UA: NEGATIVE
Protein, UA: NEGATIVE
Spec Grav, UA: >=1.03 — AB (ref 1.010–1.025)
Urobilinogen, UA: 0.2 E.U./dL
pH, UA: 6 (ref 5.0–8.0)

## 2018-05-08 MED ORDER — CYCLOBENZAPRINE HCL 10 MG PO TABS: 10.0000 mg | ORAL_TABLET | Freq: Three times a day (TID) | ORAL | 0 refills | Status: DC | PRN

## 2018-05-08 MED ORDER — TRAMADOL HCL 50 MG PO TABS: 50.0000 mg | ORAL_TABLET | Freq: Three times a day (TID) | ORAL | 0 refills | Status: DC | PRN

## 2018-05-08 NOTE — Progress Notes (Signed)
poct

## 2018-05-08 NOTE — Patient Instructions (Signed)
Please set up f/u appointment with dr. Judi Cong in 1-2 weeks. Return for worsening symptoms sooner.   You may take the pain medication and muscle relaxer as needed for pain. Monitor for drowsiness and confusion.    Please go to our [facility] office to get your xrays done. You can walk in M-F between 8am and 5pm. Tell them you are there for xrays ordered by me. They will send me the results, then I will let you know the results with instructions.   Address: [place], [place], [place]  (office sits at [address] at [address]; from here, turn left onto [name] [address]), take to [address], turn right and go for a mile or so, office will be on left across form [address] [address] )  If you have any questions or concerns, please don't hesitate to send me a message via MyChart or call the office at [phone]. Thank you for visiting with [name] today! It's our pleasure caring for you.

## 2018-05-08 NOTE — Progress Notes (Signed)
Subjective  CC:  Chief Complaint  Patient presents with  . Back Pain    Mid/Lower back pain, started on right side yesterday but across back today    HPI: [name] is a 84 y.o. female who presents to the office today to address the problems listed above in the chief complaint.  Pt is seen as an acute walk in visit.   C/o acute onset of thoracic back pain, started on right side last night but now bilateral. Pain with movements and deep breathing. No injury. No shoulder, neck or low back pain. Has multiple medical problems including pulm htn and copd, cad on coumadin for h/o PE and osteoporosis with h/o compression fracture.   Was out to lunch with daughter when pain persisted so came for eval. No f/c/s, change in appetite; denies change in breathing. No abdominal pain, n/v/d. Took otc med w/o relief. Slept ok. No lower ext pain. Nl b/b function Assessment  1. Acute bilateral thoracic back pain   2. Abdominal pain, unspecified abdominal location   3. History of compression fracture of spine   4. Pleuritic chest pain   5. Pulmonary hypertension, primary ([place])      Plan   Back pain:  Most c/w msk pain with mm spasm by history and exam; check xray of thoracic spine to r/o fracture; check lung xray given pleuritic sxs. Treat with pain meds, warm heat and f/u if persists. Patient understands and agrees with care plan.    Follow up: Return if symptoms worsen or fail to improve.   Orders Placed This Encounter  Procedures  . DG Chest 2 View  . DG Thoracic Spine W/Swimmers  . CBC with Differential/Platelet  . Comprehensive metabolic panel  . POCT urinalysis dipstick   Meds ordered this encounter  Medications  . cyclobenzaprine (FLEXERIL) 10 MG tablet    Sig: Take 1 tablet (10 mg total) by mouth 3 (three) times daily as needed for muscle spasms.    Dispense:  30 tablet    Refill:  0  . traMADol (ULTRAM) 50 MG tablet    Sig: Take 1 tablet (50 mg total) by mouth every 8  (eight) hours as needed.    Dispense:  12 tablet    Refill:  0      I reviewed the patients updated PMH, FH, and SocHx.    Patient Active Problem List   Diagnosis Date Noted  . Cough 11/20/2017  . Abdominal pain 10/15/2017  . Wheezing 08/06/2017  . General weakness 08/02/2016  . Encounter for therapeutic drug monitoring 12/16/2013  . Acute upper respiratory infection 12/16/2013  . Nonspecific abnormal finding in stool contents 10/19/2013  . Benign neoplasm of colon 10/19/2013  . Urinary frequency 08/28/2013  . Hematochezia 05/26/2013  . Chest pain 11/10/2012  . Vertigo 05/28/2012  . Trapezius strain 01/10/2012  . Chronic respiratory failure ([place]) 12/21/2011  . Bronchiectasis ([place]) 12/11/2011  . Fatigue 11/29/2011  . Preventative health care 09/22/2011  . Impaired glucose tolerance 09/21/2011  . Internal hemorrhoids without complication 62/37/6283  . Palpitations 05/21/2011  . Long term (current) use of anticoagulants 01/03/2011  . Pulmonary embolism ([place]) 12/08/2010  . FLATULENCE-GAS-BLOATING 07/03/2010  . ABNORMAL FINDINGS GI TRACT 07/03/2010  . WEIGHT LOSS-ABNORMAL 06/06/2010  . NAUSEA 06/06/2010  . ABDOMINAL PAIN-PERIUMBILICAL 15/17/6160  . ABDOMINAL PAIN-EPIGASTRIC 06/06/2010  . ABNORMAL EXAM-BILIARY TRACT 06/06/2010  . DIZZINESS, CHRONIC 10/14/2009  . OVERACTIVE BLADDER 05/26/2009  . CORONARY ARTERY DISEASE 04/06/2009  . COLONIC POLYPS, HX  OF 04/06/2009  . Chronic rhinitis 11/22/2008  . Dysuria 05/27/2008  . HEMATOCHEZIA 05/07/2008  . COPD (chronic obstructive pulmonary disease) ([place]) 03/31/2008  . PULMONARY HYPERTENSION, SECONDARY 01/15/2008  . Dyspnea 12/30/2007  . GAIT IMBALANCE 11/26/2007  . Unspecified hearing loss 10/17/2007  . Hyperlipidemia 08/05/2007  . ANXIETY 08/05/2007  . DEPRESSION 08/05/2007  . COMMON MIGRAINE 08/05/2007  . CAROTID ARTERY STENOSIS, RIGHT 08/05/2007  . Allergic rhinitis 08/05/2007  . DIVERTICULOSIS, COLON 08/05/2007  .  CYST/PSEUDOCYST, PANCREAS 08/05/2007  . OSTEOARTHRITIS, KNEE, RIGHT 08/05/2007  . Personal History of Other Diseases of Digestive Disease 08/05/2007  . Essential hypertension 03/11/2007  . GERD 03/11/2007  . OSTEOPOROSIS 03/11/2007  . TRANSIENT ISCHEMIC ATTACK, HX OF 03/11/2007   Current Meds  Medication Sig  . albuterol (ACCUNEB) 0.63 MG/3ML nebulizer solution Take 3 mLs (0.63 mg total) by nebulization every 6 (six) hours as needed for wheezing.  [name] amLODipine (NORVASC) 5 MG tablet TAKE 1 TABLET EVERY DAY  . budesonide (RHINOCORT ALLERGY) 32 MCG/ACT nasal spray Place 1 spray into both nostrils daily as needed for rhinitis or allergies.   . Digestive Enzymes (PAPAYA AND ENZYMES) CHEW Chew 2 tablets by mouth 3 (three) times daily with meals as needed (digestion of big meals).   . loratadine (CLARITIN) 10 MG tablet Take 10 mg by mouth daily.   [name] losartan (COZAAR) 100 MG tablet TAKE 1 TABLET EVERY DAY  . meclizine (ANTIVERT) 12.5 MG tablet Take 1 tablet (12.5 mg total) by mouth 3 (three) times daily as needed for dizziness.  . Multiple Vitamins-Minerals (MACUVITE) TABS Take 1 tablet by mouth daily.    . Probiotic Product (PROBIOTIC DAILY PO) Take 1 tablet by mouth daily.  . ranitidine (ZANTAC) 300 MG tablet Take 300 mg by mouth at bedtime.  . VENTOLIN HFA 108 (90 Base) MCG/ACT inhaler INHALE 2 PUFFS INTO THE LUNGS EVERY 6 HOURS AS NEEDED FOR WHEEZING OR SHORTNESS OF BREATH.  . warfarin (COUMADIN) 5 MG tablet TAKE AS DIRECTED BY ANTICOAGULATION CLINIC    Allergies: Patient is allergic to amoxicillin-pot clavulanate; ciclesonide; ciprofloxacin; raloxifene; and sulfonamide derivatives. Family History: Patient family history includes Breast cancer in her daughter; Emphysema in her father; Heart attack in her mother; Heart disease in her mother; Lymphoma in her sister; Stroke in her father. Social History:  Patient  reports that she has never smoked. She has never used smokeless tobacco. She  reports that she does not drink alcohol or use drugs.  Review of Systems: Constitutional: Negative for fever malaise or anorexia Cardiovascular: negative for chest pain Respiratory: negative for SOB or persistent cough Gastrointestinal: negative for abdominal pain  Objective  Vitals: BP 140/76   Pulse 86   Temp 98.4 F (36.9 C) (Oral)   Resp 17   Ht 5\' 5"  (1.651 m)   Wt 145 lb 6.4 oz (66 kg)   SpO2 94%   BMI 24.20 kg/m  General: no acute distress , A&Ox3, pursed lip breathing - chronic per pt's daughter HEENT: PEERL, conjunctiva normal, Oropharynx moist,neck is supple Cardiovascular:  RRR with murmur, no gallop Respiratory:  Distant breath sounds bilaterally, CTAB with normal respiratory effort Back: no spinal ttp, no rash, moves fairly well. ROM is good except pain with forward flexion - spasms/catches, no chest wall ttp Skin:  Warm, no rashes     Commons side effects, risks, benefits, and alternatives for medications and treatment plan prescribed today were discussed, and the patient expressed understanding of the given instructions. Patient is instructed to call  or message via MyChart if he/she has any questions or concerns regarding our treatment plan. No barriers to understanding were identified. We discussed Red Flag symptoms and signs in detail. Patient expressed understanding regarding what to do in case of urgent or emergency type symptoms.   Medication list was reconciled, printed and provided to the patient in AVS. Patient instructions and summary information was reviewed with the patient as documented in the AVS. This note was prepared with assistance of Dragon voice recognition software. Occasional wrong-word or sound-a-like substitutions may have occurred due to the inherent limitations of voice recognition software

## 2018-05-09 LAB — COMPREHENSIVE METABOLIC PANEL
ALBUMIN: 3.8 g/dL (ref 3.5–5.2)
ALT: 16 U/L (ref 0–35)
AST: 20 U/L (ref 0–37)
Alkaline Phosphatase: 79 U/L (ref 39–117)
BUN: 17 mg/dL (ref 6–23)
CO2: 31 mEq/L (ref 19–32)
Calcium: 9.8 mg/dL (ref 8.4–10.5)
Chloride: 94 mEq/L — ABNORMAL LOW (ref 96–112)
Creatinine, Ser: 0.77 mg/dL (ref 0.40–1.20)
GFR: 75.53 mL/min (ref >60.00)
Glucose, Bld: 112 mg/dL — ABNORMAL HIGH (ref 70–99)
POTASSIUM: 4.1 meq/L (ref 3.5–5.1)
Sodium: 133 mEq/L — ABNORMAL LOW (ref 135–145)
TOTAL PROTEIN: 7.3 g/dL (ref 6.0–8.3)
Total Bilirubin: 0.5 mg/dL (ref 0.2–1.2)

## 2018-05-09 NOTE — Progress Notes (Signed)
Please call patient: I have reviewed his/her lab results. Labs are stable. Follow up as directed.

## 2018-05-09 NOTE — Progress Notes (Signed)
Please call patient: I have reviewed his/her xrayresults. Chest xray and spine xrays are stable. No fractures or lung problems are identified. Follow instructions and f/u as directed.

## 2018-05-26 ENCOUNTER — Encounter: Payer: Self-pay | Admitting: Internal Medicine

## 2018-05-26 ENCOUNTER — Ambulatory Visit (INDEPENDENT_AMBULATORY_CARE_PROVIDER_SITE_OTHER): Payer: Medicare Other | Admitting: Internal Medicine

## 2018-05-26 VITALS — BP 136/82 | HR 86 | Temp 97.9°F | Ht 65.0 in | Wt 138.0 lb

## 2018-05-26 DIAGNOSIS — M546 Pain in thoracic spine: Secondary | ICD-10-CM | POA: Diagnosis not present

## 2018-05-26 DIAGNOSIS — F329 Major depressive disorder, single episode, unspecified: Secondary | ICD-10-CM

## 2018-05-26 DIAGNOSIS — I10 Essential (primary) hypertension: Secondary | ICD-10-CM

## 2018-05-26 DIAGNOSIS — F32A Depression, unspecified: Secondary | ICD-10-CM

## 2018-05-26 MED ORDER — HYDROCODONE-ACETAMINOPHEN 5-325 MG PO TABS: 1.0000 | ORAL_TABLET | Freq: Four times a day (QID) | ORAL | 0 refills | Status: DC | PRN

## 2018-05-26 MED ORDER — CYCLOBENZAPRINE HCL 10 MG PO TABS: 10.0000 mg | ORAL_TABLET | Freq: Three times a day (TID) | ORAL | 1 refills | Status: DC | PRN

## 2018-05-26 MED ORDER — LIDOCAINE 5 % EX PTCH: 1.0000 | MEDICATED_PATCH | CUTANEOUS | 1 refills | Status: DC

## 2018-05-26 MED ORDER — SERTRALINE HCL 50 MG PO TABS: 50.0000 mg | ORAL_TABLET | Freq: Every day | ORAL | 3 refills | Status: AC

## 2018-05-26 NOTE — Progress Notes (Signed)
Subjective:    Patient ID: [name], female    DOB: 1929-11-18, 84 y.o.   MRN: [id number]  HPI  Here with acute onset 2 wks sharp back pain lower thoracic with radiation across the back, may have started with spasms and sharp pains just after a longer car ride;  Stil has pain to lie down at night across the back lower, overall better and better with flexeril and heat, ultram no help.  Denies urinary symptoms such as dysuria, frequency, urgency, flank pain, hematuria or n/v, fever, chills.  Denies worsening reflux, abd pain, dysphagia, n/v, bowel change or blood.  Has had 2 months mild worsening depressive symptoms, but no suicidal ideation, or panic; has resisted tx for several yrs, now asking for zoloft Past Medical History:  Diagnosis Date  . ALLERGIC RHINITIS   . ANXIETY   . Bronchiectasis 12/11/2011   CT chest dx  . CAROTID ARTERY STENOSIS, RIGHT   . Chronic idiopathic constipation   . Chronic rhinitis   . COMMON MIGRAINE   . Complication of anesthesia    dizzy  . COPD   . CORONARY ARTERY DISEASE   . CYST/PSEUDOCYST, PANCREAS   . DEPRESSION   . DIVERTICULOSIS, COLON   . DIZZINESS, CHRONIC   . Esophageal stricture   . GERD   . HYPERLIPIDEMIA   . HYPERTENSION   . Internal hemorrhoids with complication   . OSTEOPOROSIS   . OVERACTIVE BLADDER   . Pulmonary embolism ([place]) 2008  . PULMONARY HYPERTENSION, SECONDARY   . Stroke ([place])   . Tubular adenoma of colon 10/2013  . Unspecified hearing loss    Past Surgical History:  Procedure Laterality Date  . ABDOMINAL HYSTERECTOMY  1965  . APPENDECTOMY  1963  . CHOLECYSTECTOMY  1963  . COLONOSCOPY N/A 10/19/2013   Procedure: COLONOSCOPY;  Surgeon: Ladene Artist, MD;  Location: [facility];  Service: Endoscopy;  Laterality: N/A;  . EGD  1999  . PANCREATIC CYST DRAINAGE     x 2  . s/p sinus surgury    . TONSILLECTOMY      reports that she has never smoked. She has never used smokeless tobacco. She reports that she does  not drink alcohol or use drugs. family history includes Breast cancer in her daughter; Emphysema in her father; Heart attack in her mother; Heart disease in her mother; Lymphoma in her sister; Stroke in her father. Allergies  Allergen Reactions  . Amoxicillin-Pot Clavulanate     REACTION: diarrhea Has patient had a PCN reaction causing immediate rash, facial/tongue/throat swelling, SOB or lightheadedness with hypotension: unknown Has patient had a PCN reaction causing severe rash involving mucus membranes or skin necrosis: unknown Has patient had a PCN reaction that required hospitalization : unknown Has patient had a PCN reaction occurring within the last 10 years: yes   pt states she should be able to take penicillin, but cant remember actually taking it   . Ciclesonide     REACTION: bad smell  . Ciprofloxacin Other (See Comments)    dizziness  . Raloxifene     REACTION: risk of recurrent stroke  . Sulfonamide Derivatives     REACTION: tongue swells   Current Outpatient Medications on File Prior to Visit  Medication Sig Dispense Refill  . albuterol (ACCUNEB) 0.63 MG/3ML nebulizer solution Take 3 mLs (0.63 mg total) by nebulization every 6 (six) hours as needed for wheezing. 75 mL 12  . amLODipine (NORVASC) 5 MG tablet TAKE 1 TABLET EVERY  DAY 90 tablet 1  . budesonide (RHINOCORT ALLERGY) 32 MCG/ACT nasal spray Place 1 spray into both nostrils daily as needed for rhinitis or allergies.     . Digestive Enzymes (PAPAYA AND ENZYMES) CHEW Chew 2 tablets by mouth 3 (three) times daily with meals as needed (digestion of big meals).     . loratadine (CLARITIN) 10 MG tablet Take 10 mg by mouth daily.     [name] losartan (COZAAR) 100 MG tablet TAKE 1 TABLET EVERY DAY 90 tablet 1  . meclizine (ANTIVERT) 12.5 MG tablet Take 1 tablet (12.5 mg total) by mouth 3 (three) times daily as needed for dizziness. 40 tablet 3  . Multiple Vitamins-Minerals (MACUVITE) TABS Take 1 tablet by mouth daily.      .  Probiotic Product (PROBIOTIC DAILY PO) Take 1 tablet by mouth daily.    . promethazine (PHENERGAN) 25 MG tablet Take 1 tablet (25 mg total) by mouth every 6 (six) hours as needed for nausea or vomiting. 30 tablet 1  . ranitidine (ZANTAC) 300 MG tablet Take 300 mg by mouth at bedtime.    . VENTOLIN HFA 108 (90 Base) MCG/ACT inhaler INHALE 2 PUFFS INTO THE LUNGS EVERY 6 HOURS AS NEEDED FOR WHEEZING OR SHORTNESS OF BREATH. 18 Inhaler 0  . warfarin (COUMADIN) 5 MG tablet TAKE AS DIRECTED BY ANTICOAGULATION CLINIC 60 tablet 1   No current facility-administered medications on file prior to visit.    Review of Systems  Constitutional: Negative for other unusual diaphoresis or sweats HENT: Negative for ear discharge or swelling Eyes: Negative for other worsening visual disturbances Respiratory: Negative for stridor or other swelling  Gastrointestinal: Negative for worsening distension or other blood Genitourinary: Negative for retention or other urinary change Musculoskeletal: Negative for other MSK pain or swelling Skin: Negative for color change or other new lesions Neurological: Negative for worsening tremors and other numbness  Psychiatric/Behavioral: Negative for worsening agitation or other fatigue All other system neg per pt    Objective:   Physical Exam BP 136/82   Pulse 86   Temp 97.9 F (36.6 C) (Oral)   Ht 5\' 5"  (1.651 m)   Wt 138 lb (62.6 kg)   SpO2 99%   BMI 22.96 kg/m  VS noted,  Constitutional: Pt appears in NAD HENT: Head: NCAT.  Right Ear: External ear normal.  Left Ear: External ear normal.  Eyes: . Pupils are equal, round, and reactive to light. Conjunctivae and EOM are normal Nose: without d/c or deformity Neck: Neck supple. Gross normal ROM Cardiovascular: Normal rate and regular rhythm.   Pulmonary/Chest: Effort normal and breath sounds without rales or wheezing.  Abd:  Soft, NT, ND, + BS, no organomegaly Spine nontender in the mideline, does have tender right  > left at the lower thoracic paravertebral areas Neurological: Pt is alert. At baseline orientation, motor grossly intact Skin: Skin is warm. No rashes, other new lesions, no LE edema Psychiatric: Pt behavior is normal without agitation, + depressed affect  No other exam findings  Recent t spine film and cxr  - neg for acute  Lab Results  Component Value Date   WBC 10.3 10/15/2017   HGB 14.6 10/15/2017   HCT 44.4 10/15/2017   PLT 292.0 10/15/2017   GLUCOSE 112 (H) 05/08/2018   CHOL 207 (H) 10/15/2017   TRIG 100.0 10/15/2017   HDL 71.20 10/15/2017   LDLDIRECT 122.7 11/10/2012   LDLCALC 116 (H) 10/15/2017   ALT 16 05/08/2018   AST 20 05/08/2018  NA 133 (L) 05/08/2018   K 4.1 05/08/2018   CL 94 (L) 05/08/2018   CREATININE 0.77 05/08/2018   BUN 17 05/08/2018   CO2 31 05/08/2018   TSH 2.44 10/15/2017   INR 1.8 (A) 04/23/2018   HGBA1C 5.9 10/15/2017      Assessment & Plan:

## 2018-05-26 NOTE — Assessment & Plan Note (Signed)
C/w msk strain, for refill flexeril, lidoderm asd, and limited hydrocodone prn,  to f/u any worsening symptoms or concerns

## 2018-05-26 NOTE — Assessment & Plan Note (Signed)
Ok for zoloft 50 qd,  to f/u any worsening symptoms or concerns

## 2018-05-26 NOTE — Patient Instructions (Addendum)
Ok to stop the tramadol for pain  Please take all new medication as prescribed - the hydrocodone and pain patches, as well as the zoloft at 50 mg per day  Please continue all other medications as before, and refills have been done if requested.  Please have the pharmacy call with any other refills you may need.  Please continue your efforts at being more active, low cholesterol diet, and weight control.  Please keep your appointments with your specialists as you may have planned  You are given the note for [employer] collections today

## 2018-05-26 NOTE — Assessment & Plan Note (Signed)
stable overall by history and exam, recent data reviewed with pt, and pt to continue medical treatment as before,  to f/u any worsening symptoms or concerns BP Readings from Last 3 Encounters:  05/26/18 136/82  05/08/18 140/76  03/24/18 134/68

## 2018-05-27 ENCOUNTER — Ambulatory Visit (INDEPENDENT_AMBULATORY_CARE_PROVIDER_SITE_OTHER): Payer: Medicare Other | Admitting: General Practice

## 2018-05-27 DIAGNOSIS — Z7901 Long term (current) use of anticoagulants: Secondary | ICD-10-CM

## 2018-05-27 LAB — POCT INR: INR: 2 (ref 2.0–3.0)

## 2018-05-27 NOTE — Patient Instructions (Addendum)
Pre visit review using our clinic review tool, if applicable. No additional management support is needed unless otherwise documented below in the visit note.  Continue to take 1/2 tablet all days except 1 tablet on Mondays and Fridays.  Re-check in 4 weeks.

## 2018-05-28 ENCOUNTER — Ambulatory Visit: Payer: Medicare Other

## 2018-06-10 ENCOUNTER — Other Ambulatory Visit: Payer: Self-pay | Admitting: Internal Medicine

## 2018-06-11 ENCOUNTER — Encounter: Payer: Self-pay | Admitting: Internal Medicine

## 2018-06-11 ENCOUNTER — Ambulatory Visit (INDEPENDENT_AMBULATORY_CARE_PROVIDER_SITE_OTHER): Payer: Medicare Other | Admitting: Internal Medicine

## 2018-06-11 VITALS — BP 142/88 | HR 102 | Temp 98.2°F | Ht 65.0 in | Wt 139.0 lb

## 2018-06-11 DIAGNOSIS — M546 Pain in thoracic spine: Secondary | ICD-10-CM

## 2018-06-11 DIAGNOSIS — I10 Essential (primary) hypertension: Secondary | ICD-10-CM | POA: Diagnosis not present

## 2018-06-11 DIAGNOSIS — R7302 Impaired glucose tolerance (oral): Secondary | ICD-10-CM

## 2018-06-11 MED ORDER — TRAMADOL HCL 50 MG PO TABS: 50.0000 mg | ORAL_TABLET | Freq: Three times a day (TID) | ORAL | 0 refills | Status: DC | PRN

## 2018-06-11 NOTE — Assessment & Plan Note (Signed)
stable overall by history and exam, recent data reviewed with pt, and pt to continue medical treatment as before,  to f/u any worsening symptoms or concerns Lab Results  Component Value Date   HGBA1C 5.9 10/15/2017

## 2018-06-11 NOTE — Assessment & Plan Note (Signed)
Persistent not improved with current tx, ok for tramadol prn, MRI thoracic spine,  to f/u any worsening symptoms or concerns

## 2018-06-11 NOTE — Assessment & Plan Note (Signed)
stable overall by history and exam, recent data reviewed with pt, and pt to continue medical treatment as before,  to f/u any worsening symptoms or concerns BP Readings from Last 3 Encounters:  06/11/18 (!) 142/88  05/26/18 136/82  05/08/18 140/76

## 2018-06-11 NOTE — Progress Notes (Signed)
Subjective:    Patient ID: [name], female    DOB: [birth year], 84 y.o.   MRN: [id number]  HPI  Here to f/u with persistent thoracic pain x 6 wks, just not getting better, maybe getting worse, with some pain radiating toward the sides without rash, redness or swelling.  Pt denies chest pain, increased sob or doe, wheezing, orthopnea, PND, increased LE swelling, palpitations, dizziness or syncope.   Pt denies fever, wt loss, night sweats, loss of appetite, or other constitutional symptoms   Pt denies polydipsia, polyuria lidoderm last visit was not covered, too expensive., hydrocodone use seemed to lead to a fall, so not taking  Muscle relaxer and heat help somewhat .  Nothing else makes better or worse  Wt down several lbs in 1 mo and lower appetite.  MRI abd neg mar 2019 Wt Readings from Last 3 Encounters:  06/11/18 139 lb (63 kg)  05/26/18 138 lb (62.6 kg)  05/08/18 145 lb 6.4 oz (66 kg)   Past Medical History:  Diagnosis Date  . ALLERGIC RHINITIS   . ANXIETY   . Bronchiectasis 12/11/2011   CT chest dx  . CAROTID ARTERY STENOSIS, RIGHT   . Chronic idiopathic constipation   . Chronic rhinitis   . COMMON MIGRAINE   . Complication of anesthesia    dizzy  . COPD   . CORONARY ARTERY DISEASE   . CYST/PSEUDOCYST, PANCREAS   . DEPRESSION   . DIVERTICULOSIS, COLON   . DIZZINESS, CHRONIC   . Esophageal stricture   . GERD   . HYPERLIPIDEMIA   . HYPERTENSION   . Internal hemorrhoids with complication   . OSTEOPOROSIS   . OVERACTIVE BLADDER   . Pulmonary embolism ([place]) 2008  . PULMONARY HYPERTENSION, SECONDARY   . Stroke ([place])   . Tubular adenoma of colon 10/2013  . Unspecified hearing loss    Past Surgical History:  Procedure Laterality Date  . ABDOMINAL HYSTERECTOMY  1965  . APPENDECTOMY  1963  . CHOLECYSTECTOMY  1963  . COLONOSCOPY N/A 10/19/2013   Procedure: COLONOSCOPY;  Surgeon: Ladene Artist, MD;  Location: [facility];  Service: Endoscopy;  Laterality: N/A;  .  EGD  1999  . PANCREATIC CYST DRAINAGE     x 2  . s/p sinus surgury    . TONSILLECTOMY      reports that she has never smoked. She has never used smokeless tobacco. She reports that she does not drink alcohol or use drugs. family history includes Breast cancer in her daughter; Emphysema in her father; Heart attack in her mother; Heart disease in her mother; Lymphoma in her sister; Stroke in her father. Allergies  Allergen Reactions  . Amoxicillin-Pot Clavulanate     REACTION: diarrhea Has patient had a PCN reaction causing immediate rash, facial/tongue/throat swelling, SOB or lightheadedness with hypotension: unknown Has patient had a PCN reaction causing severe rash involving mucus membranes or skin necrosis: unknown Has patient had a PCN reaction that required hospitalization : unknown Has patient had a PCN reaction occurring within the last 10 years: yes   pt states she should be able to take penicillin, but cant remember actually taking it   . Ciclesonide     REACTION: bad smell  . Ciprofloxacin Other (See Comments)    dizziness  . Raloxifene     REACTION: risk of recurrent stroke  . Sulfonamide Derivatives     REACTION: tongue swells   Current Outpatient Medications on File Prior to Visit  Medication Sig Dispense Refill  . albuterol (ACCUNEB) 0.63 MG/3ML nebulizer solution Take 3 mLs (0.63 mg total) by nebulization every 6 (six) hours as needed for wheezing. 75 mL 12  . amLODipine (NORVASC) 5 MG tablet TAKE 1 TABLET EVERY DAY 90 tablet 1  . budesonide (RHINOCORT ALLERGY) 32 MCG/ACT nasal spray Place 1 spray into both nostrils daily as needed for rhinitis or allergies.     . cyclobenzaprine (FLEXERIL) 10 MG tablet Take 1 tablet (10 mg total) by mouth 3 (three) times daily as needed for muscle spasms. 40 tablet 1  . Digestive Enzymes (PAPAYA AND ENZYMES) CHEW Chew 2 tablets by mouth 3 (three) times daily with meals as needed (digestion of big meals).     [name]  HYDROcodone-acetaminophen (NORCO/VICODIN) 5-325 MG tablet Take 1 tablet by mouth every 6 (six) hours as needed. 30 tablet 0  . loratadine (CLARITIN) 10 MG tablet Take 10 mg by mouth daily.     [name] losartan (COZAAR) 100 MG tablet TAKE 1 TABLET EVERY DAY 90 tablet 1  . meclizine (ANTIVERT) 12.5 MG tablet Take 1 tablet (12.5 mg total) by mouth 3 (three) times daily as needed for dizziness. 40 tablet 3  . Multiple Vitamins-Minerals (MACUVITE) TABS Take 1 tablet by mouth daily.      . Probiotic Product (PROBIOTIC DAILY PO) Take 1 tablet by mouth daily.    . promethazine (PHENERGAN) 25 MG tablet Take 1 tablet (25 mg total) by mouth every 6 (six) hours as needed for nausea or vomiting. 30 tablet 1  . ranitidine (ZANTAC) 300 MG tablet Take 300 mg by mouth at bedtime.    . sertraline (ZOLOFT) 50 MG tablet Take 1 tablet (50 mg total) by mouth daily. 90 tablet 3  . VENTOLIN HFA 108 (90 Base) MCG/ACT inhaler INHALE 2 PUFFS INTO THE LUNGS EVERY 6 HOURS AS NEEDED FOR WHEEZING OR SHORTNESS OF BREATH. 18 Inhaler 0  . warfarin (COUMADIN) 5 MG tablet TAKE AS DIRECTED BY ANTICOAGULATION CLINIC 60 tablet 1   No current facility-administered medications on file prior to visit.    Review of Systems  Constitutional: Negative for other unusual diaphoresis or sweats HENT: Negative for ear discharge or swelling Eyes: Negative for other worsening visual disturbances Respiratory: Negative for stridor or other swelling  Gastrointestinal: Negative for worsening distension or other blood Genitourinary: Negative for retention or other urinary change Musculoskeletal: Negative for other MSK pain or swelling Skin: Negative for color change or other new lesions Neurological: Negative for worsening tremors and other numbness  Psychiatric/Behavioral: Negative for worsening agitation or other fatigue All other system neg per pt    Objective:   Physical Exam BP (!) 142/88   Pulse (!) 102   Temp 98.2 F (36.8 C) (Oral)   Ht  5\' 5"  (1.651 m)   Wt 139 lb (63 kg)   SpO2 97%   BMI 23.13 kg/m  VS noted,  Constitutional: Pt appears in NAD HENT: Head: NCAT.  Right Ear: External ear normal.  Left Ear: External ear normal.  Eyes: . Pupils are equal, round, and reactive to light. Conjunctivae and EOM are normal Nose: without d/c or deformity Neck: Neck supple. Gross normal ROM Cardiovascular: Normal rate and regular rhythm.   Pulmonary/Chest: Effort normal and breath sounds without rales or wheezing.  Abd:  Soft, NT, ND, + BS, no organomegaly Neurological: Pt is alert. At baseline orientation, motor grossly intact Skin: Skin is warm. No rashes, other new lesions, no LE edema Psychiatric: Pt behavior  is normal without agitation  No other exam findings Lab Results  Component Value Date   WBC 10.3 10/15/2017   HGB 14.6 10/15/2017   HCT 44.4 10/15/2017   PLT 292.0 10/15/2017   GLUCOSE 112 (H) 05/08/2018   CHOL 207 (H) 10/15/2017   TRIG 100.0 10/15/2017   HDL 71.20 10/15/2017   LDLDIRECT 122.7 11/10/2012   LDLCALC 116 (H) 10/15/2017   ALT 16 05/08/2018   AST 20 05/08/2018   NA 133 (L) 05/08/2018   K 4.1 05/08/2018   CL 94 (L) 05/08/2018   CREATININE 0.77 05/08/2018   BUN 17 05/08/2018   CO2 31 05/08/2018   TSH 2.44 10/15/2017   INR 2.0 05/27/2018   HGBA1C 5.9 10/15/2017       Assessment & Plan:

## 2018-06-11 NOTE — Patient Instructions (Signed)
OK to change the hydrocodone to tramadol 50 mg as needed  We are only able to do 1 week to start, but if you need further, please call  Please continue all other medications as before, and refills have been done if requested.  Please have the pharmacy call with any other refills you may need.  Please keep your appointments with your specialists as you may have planned  You will be contacted regarding the referral for: MRI - to see the PCCs now  You will be contacted by phone if any changes need to be made immediately.  Otherwise, you will receive a letter about your results with an explanation, but please check with MyChart first.  Please remember to sign up for MyChart if you have not done so, as this will be important to you in the future with finding out test results, communicating by private email, and scheduling acute appointments online when needed.

## 2018-06-17 ENCOUNTER — Other Ambulatory Visit: Payer: Self-pay | Admitting: Internal Medicine

## 2018-06-17 ENCOUNTER — Ambulatory Visit
Admission: RE | Admit: 2018-06-17 | Discharge: 2018-06-17 | Disposition: A | Discharge status: Home / Self Care | Payer: Medicare Other | Source: Ambulatory Visit | Attending: Internal Medicine | Admitting: Internal Medicine

## 2018-06-17 ENCOUNTER — Encounter: Payer: Self-pay | Admitting: Internal Medicine

## 2018-06-17 DIAGNOSIS — S22069A Unspecified fracture of T7-T8 vertebra, initial encounter for closed fracture: Secondary | ICD-10-CM | POA: Diagnosis not present

## 2018-06-17 DIAGNOSIS — M546 Pain in thoracic spine: Secondary | ICD-10-CM

## 2018-06-17 DIAGNOSIS — S22060D Wedge compression fracture of T7-T8 vertebra, subsequent encounter for fracture with routine healing: Secondary | ICD-10-CM

## 2018-06-18 ENCOUNTER — Telehealth: Payer: Self-pay

## 2018-06-18 NOTE — Telephone Encounter (Signed)
-----   Message from [name], MD sent at 06/17/2018  5:11 PM EDT ----- Letter sent, cont same tx except  The test results show that your current treatment is OK, except there is an acute fracture that we suspected about the mid thoracic spine.  Please let us know if you need anything further for pain, and I will refer to neurosurgury, as the pain can be improved with Kyphoplasty (a procedure to build the bone back up).  Redmond Baseman to please inform pt, I will do referral

## 2018-06-18 NOTE — Telephone Encounter (Signed)
Pt has been informed of results and expressed understanding.  °

## 2018-06-20 ENCOUNTER — Telehealth: Payer: Self-pay | Admitting: Internal Medicine

## 2018-06-20 NOTE — Telephone Encounter (Signed)
Sorry, I am not able to do phone consultations.  This situation is common and can be addressed at her next visit

## 2018-06-20 NOTE — Telephone Encounter (Signed)
Called patient to schedule an appointment. She states that she can not drive or ride in car and is not able to make an appointment.  I read her Dr. Gwynn Burly note from the MRI results and informed her that a referral has been sent to [facility]. Gave patient their contact information so that she can call them to schedule.  She expressed understanding and did not have any further questions at this time.

## 2018-06-20 NOTE — Telephone Encounter (Signed)
Pt left me a vm requesting to talk with Dr. [name]. She is wondering why the x ray she had several weeks ago did not show any fractures and the MRI did Can you please give her a call.

## 2018-06-23 ENCOUNTER — Ambulatory Visit: Payer: Medicare Other

## 2018-06-24 ENCOUNTER — Ambulatory Visit (INDEPENDENT_AMBULATORY_CARE_PROVIDER_SITE_OTHER): Payer: Medicare Other | Admitting: General Practice

## 2018-06-24 DIAGNOSIS — Z23 Encounter for immunization: Secondary | ICD-10-CM | POA: Diagnosis not present

## 2018-06-24 DIAGNOSIS — Z7901 Long term (current) use of anticoagulants: Secondary | ICD-10-CM | POA: Diagnosis not present

## 2018-06-24 LAB — POCT INR: INR: 2.4 (ref 2.0–3.0)

## 2018-06-24 NOTE — Patient Instructions (Addendum)
Pre visit review using our clinic review tool, if applicable. No additional management support is needed unless otherwise documented below in the visit note.  Continue to take 1/2 tablet all days except 1 tablet on Mondays and Fridays.  Re-check in 6 weeks.

## 2018-06-30 DIAGNOSIS — M4854XA Collapsed vertebra, not elsewhere classified, thoracic region, initial encounter for fracture: Secondary | ICD-10-CM | POA: Diagnosis not present

## 2018-07-30 ENCOUNTER — Other Ambulatory Visit: Payer: Self-pay

## 2018-07-30 ENCOUNTER — Emergency Department (HOSPITAL_COMMUNITY): Payer: Medicare Other

## 2018-07-30 ENCOUNTER — Emergency Department (HOSPITAL_COMMUNITY)
Admission: EM | Admit: 2018-07-30 | Discharge: 2018-07-30 | Disposition: A | Discharge status: Home / Self Care | Payer: Medicare Other | Attending: Emergency Medicine | Admitting: Emergency Medicine

## 2018-07-30 ENCOUNTER — Encounter (HOSPITAL_COMMUNITY): Payer: Self-pay | Admitting: Emergency Medicine

## 2018-07-30 DIAGNOSIS — E871 Hypo-osmolality and hyponatremia: Secondary | ICD-10-CM | POA: Diagnosis not present

## 2018-07-30 DIAGNOSIS — Z7901 Long term (current) use of anticoagulants: Secondary | ICD-10-CM | POA: Insufficient documentation

## 2018-07-30 DIAGNOSIS — M5489 Other dorsalgia: Secondary | ICD-10-CM | POA: Diagnosis present

## 2018-07-30 DIAGNOSIS — Y93E8 Activity, other personal hygiene: Secondary | ICD-10-CM | POA: Diagnosis not present

## 2018-07-30 DIAGNOSIS — J449 Chronic obstructive pulmonary disease, unspecified: Secondary | ICD-10-CM | POA: Diagnosis not present

## 2018-07-30 DIAGNOSIS — S32020A Wedge compression fracture of second lumbar vertebra, initial encounter for closed fracture: Secondary | ICD-10-CM | POA: Diagnosis not present

## 2018-07-30 DIAGNOSIS — Y92002 Bathroom of unspecified non-institutional (private) residence single-family (private) house as the place of occurrence of the external cause: Secondary | ICD-10-CM | POA: Diagnosis not present

## 2018-07-30 DIAGNOSIS — Y999 Unspecified external cause status: Secondary | ICD-10-CM | POA: Insufficient documentation

## 2018-07-30 DIAGNOSIS — I10 Essential (primary) hypertension: Secondary | ICD-10-CM | POA: Diagnosis not present

## 2018-07-30 DIAGNOSIS — S32000A Wedge compression fracture of unspecified lumbar vertebra, initial encounter for closed fracture: Secondary | ICD-10-CM | POA: Insufficient documentation

## 2018-07-30 DIAGNOSIS — X509XXA Other and unspecified overexertion or strenuous movements or postures, initial encounter: Secondary | ICD-10-CM | POA: Diagnosis not present

## 2018-07-30 DIAGNOSIS — Z79899 Other long term (current) drug therapy: Secondary | ICD-10-CM | POA: Diagnosis not present

## 2018-07-30 DIAGNOSIS — S32030A Wedge compression fracture of third lumbar vertebra, initial encounter for closed fracture: Secondary | ICD-10-CM | POA: Diagnosis not present

## 2018-07-30 DIAGNOSIS — S32040A Wedge compression fracture of fourth lumbar vertebra, initial encounter for closed fracture: Secondary | ICD-10-CM | POA: Diagnosis not present

## 2018-07-30 LAB — URINALYSIS, ROUTINE W REFLEX MICROSCOPIC
Bilirubin Urine: NEGATIVE
GLUCOSE, UA: NEGATIVE mg/dL
KETONES UR: 5 mg/dL — AB
LEUKOCYTES UA: NEGATIVE
NITRITE: NEGATIVE
Protein, ur: NEGATIVE mg/dL
Specific Gravity, Urine: 1.006 (ref 1.005–1.030)
pH: 5 (ref 5.0–8.0)

## 2018-07-30 LAB — CBC WITH DIFFERENTIAL/PLATELET
Abs Immature Granulocytes: 0.05 10*3/uL (ref 0.00–0.07)
BASOS ABS: 0 10*3/uL (ref 0.0–0.1)
Basophils Relative: 0 %
EOS ABS: 0 10*3/uL (ref 0.0–0.5)
Eosinophils Relative: 0 %
HCT: 43.7 % (ref 36.0–46.0)
Hemoglobin: 14.1 g/dL (ref 12.0–15.0)
IMMATURE GRANULOCYTES: 1 %
Lymphocytes Relative: 11 %
Lymphs Abs: 1.3 10*3/uL (ref 0.7–4.0)
MCH: 30.9 pg (ref 26.0–34.0)
MCHC: 32.3 g/dL (ref 30.0–36.0)
MCV: 95.8 fL (ref 80.0–100.0)
Monocytes Absolute: 1 10*3/uL (ref 0.1–1.0)
Monocytes Relative: 9 %
NEUTROS PCT: 79 %
NRBC: 0 % (ref 0.0–0.2)
Neutro Abs: 8.7 10*3/uL — ABNORMAL HIGH (ref 1.7–7.7)
PLATELETS: 258 10*3/uL (ref 150–400)
RBC: 4.56 MIL/uL (ref 3.87–5.11)
RDW: 13.7 % (ref 11.5–15.5)
WBC: 11.1 10*3/uL — ABNORMAL HIGH (ref 4.0–10.5)

## 2018-07-30 LAB — BASIC METABOLIC PANEL
ANION GAP: 10 (ref 5–15)
BUN: 11 mg/dL (ref 8–23)
CALCIUM: 9.1 mg/dL (ref 8.9–10.3)
CO2: 29 mmol/L (ref 22–32)
Chloride: 88 mmol/L — ABNORMAL LOW (ref 98–111)
Creatinine, Ser: 0.48 mg/dL (ref 0.44–1.00)
Glucose, Bld: 115 mg/dL — ABNORMAL HIGH (ref 70–99)
Potassium: 3.6 mmol/L (ref 3.5–5.1)
SODIUM: 127 mmol/L — AB (ref 135–145)

## 2018-07-30 MED ORDER — HYDROCODONE-ACETAMINOPHEN 5-325 MG PO TABS: 1.0000 | ORAL_TABLET | Freq: Four times a day (QID) | ORAL | 0 refills | Status: DC | PRN

## 2018-07-30 MED ORDER — HYDROCODONE-ACETAMINOPHEN 5-325 MG PO TABS
0.5000 | ORAL_TABLET | Freq: Once | ORAL | Status: DC
  Filled 2018-07-30: qty 1

## 2018-07-30 NOTE — ED Notes (Signed)
Pt given water, and encouraged to give urine sample

## 2018-07-30 NOTE — ED Provider Notes (Signed)
Medical screening examination/treatment/procedure(s) were conducted as a shared visit with non-physician practitioner(s) and myself.  I personally evaluated the patient during the encounter.  None Patient had prior compression fracture and wear a TLSO.  She had taken it off to take shower.  She bent over to pick up a dropped soap.  She got severe pain in her back that radiates around her size.  No weakness no numbness in the legs.  She has taken a half of a Vicodin tablet that was left over from a root canal prescription.  She reports it was slightly helpful.  Is alert and nontoxic.  She is now wearing her TLSO.  She is sitting up in the bed.  No respiratory distress.  Lungs grossly clear with occasional expiratory wheeze.  Heart regular.  No peripheral edema.  Movements coordinated purposeful symmetric.  Agree with plan of management.   Charlesetta Shanks, MD 07/30/18 [phone]

## 2018-07-30 NOTE — ED Triage Notes (Signed)
Patient reports wearing back brace for fracture x1 month. Reports bending over x3 days ago and experiencing new sharp lower back pain. Attempted to see neurosurgeon, but reports cannot be seen for ten days and pain remains severe. Reports pain worsens with movement. Denies changes with bowel/bladder and numbness/tingling.

## 2018-07-30 NOTE — Discharge Instructions (Addendum)
Continue taking home medications as prescribed. Drink only 2-3 bottles of water throughout the day, and try to increase her salt intake. Use 1000 mg of Tylenol for mild to moderate pain.  If pain is severe, you may take Norco.  Have caution, as this may cause increased risk for falls. It is very important that you follow-up with your primary care doctor for further evaluation of your blood work. Follow-up with your back surgeon for further evaluation and management of your back. Return to the emergency room if you develop high fevers, loss of bowel bladder control, numbness, or inability to walk.

## 2018-07-30 NOTE — ED Provider Notes (Signed)
[facility] DEPT Provider Note   CSN: [id number] Arrival date & time: 07/30/18  1408     History   Chief Complaint Chief Complaint  Patient presents with  . Back Pain    HPI [name] is a 84 y.o. female presenting for evaluation of back pain.  Patient states last month she developed a compression fracture of T8, was placed in TLSO, which is vastly improved her pain.  3 days ago, she was in the shower when she bent forward and had acute onset low back pain.  Patient reports she has a history of osteoporosis.  She has been trying to manage her pain with muscle relaxers, half a Norco tablet, and Tylenol with mild improvement of symptoms.  Pain is sharp and severe with any movement, mild at rest.  No radiation of the pain.  Patient denies fall or injury.  She denies fevers, chills, neck pain, rash, loss of bowel bladder control, numbness, tingling.  Patient is able to walk, but states this causes further pain.  She called her neurosurgeon who is currently away on vacation, thus she came to the ER for further evaluation. Additionally, patient states in the past 24 hours, she has had significant decrease in urination.  She denies dysuria or hematuria.   HPI  Past Medical History:  Diagnosis Date  . ALLERGIC RHINITIS   . ANXIETY   . Bronchiectasis 12/11/2011   CT chest dx  . CAROTID ARTERY STENOSIS, RIGHT   . Chronic idiopathic constipation   . Chronic rhinitis   . COMMON MIGRAINE   . Complication of anesthesia    dizzy  . COPD   . CORONARY ARTERY DISEASE   . CYST/PSEUDOCYST, PANCREAS   . DEPRESSION   . DIVERTICULOSIS, COLON   . DIZZINESS, CHRONIC   . Esophageal stricture   . GERD   . HYPERLIPIDEMIA   . HYPERTENSION   . Internal hemorrhoids with complication   . OSTEOPOROSIS   . OVERACTIVE BLADDER   . Pulmonary embolism ([place]) 2008  . PULMONARY HYPERTENSION, SECONDARY   . Stroke ([place])   . Tubular adenoma of colon 10/2013  .  Unspecified hearing loss     Patient Active Problem List   Diagnosis Date Noted  . Acute bilateral thoracic back pain 05/26/2018  . Cough 11/20/2017  . Abdominal pain 10/15/2017  . Wheezing 08/06/2017  . General weakness 08/02/2016  . Encounter for therapeutic drug monitoring 12/16/2013  . Acute upper respiratory infection 12/16/2013  . Nonspecific abnormal finding in stool contents 10/19/2013  . Benign neoplasm of colon 10/19/2013  . Urinary frequency 08/28/2013  . Hematochezia 05/26/2013  . Chest pain 11/10/2012  . Vertigo 05/28/2012  . Trapezius strain 01/10/2012  . Chronic respiratory failure ([place]) 12/21/2011  . Bronchiectasis ([place]) 12/11/2011  . Fatigue 11/29/2011  . Preventative health care 09/22/2011  . Impaired glucose tolerance 09/21/2011  . Internal hemorrhoids without complication 26/94/8546  . Palpitations 05/21/2011  . Long term (current) use of anticoagulants 01/03/2011  . Pulmonary embolism ([place]) 12/08/2010  . FLATULENCE-GAS-BLOATING 07/03/2010  . ABNORMAL FINDINGS GI TRACT 07/03/2010  . WEIGHT LOSS-ABNORMAL 06/06/2010  . NAUSEA 06/06/2010  . ABDOMINAL PAIN-PERIUMBILICAL 27/12/5007  . ABDOMINAL PAIN-EPIGASTRIC 06/06/2010  . ABNORMAL EXAM-BILIARY TRACT 06/06/2010  . DIZZINESS, CHRONIC 10/14/2009  . OVERACTIVE BLADDER 05/26/2009  . CORONARY ARTERY DISEASE 04/06/2009  . COLONIC POLYPS, HX OF 04/06/2009  . Chronic rhinitis 11/22/2008  . Dysuria 05/27/2008  . HEMATOCHEZIA 05/07/2008  . COPD (chronic obstructive pulmonary disease) ([place])  03/31/2008  . PULMONARY HYPERTENSION, SECONDARY 01/15/2008  . Dyspnea 12/30/2007  . GAIT IMBALANCE 11/26/2007  . Unspecified hearing loss 10/17/2007  . Hyperlipidemia 08/05/2007  . ANXIETY 08/05/2007  . Depression 08/05/2007  . COMMON MIGRAINE 08/05/2007  . CAROTID ARTERY STENOSIS, RIGHT 08/05/2007  . Allergic rhinitis 08/05/2007  . DIVERTICULOSIS, COLON 08/05/2007  . CYST/PSEUDOCYST, PANCREAS 08/05/2007  .  OSTEOARTHRITIS, KNEE, RIGHT 08/05/2007  . Personal History of Other Diseases of Digestive Disease 08/05/2007  . Essential hypertension 03/11/2007  . GERD 03/11/2007  . OSTEOPOROSIS 03/11/2007  . TRANSIENT ISCHEMIC ATTACK, HX OF 03/11/2007    Past Surgical History:  Procedure Laterality Date  . ABDOMINAL HYSTERECTOMY  1965  . APPENDECTOMY  1963  . CHOLECYSTECTOMY  1963  . COLONOSCOPY N/A 10/19/2013   Procedure: COLONOSCOPY;  Surgeon: Ladene Artist, MD;  Location: [facility] END[facility]COPY;  Service: Endoscopy;  Laterality: N/A;  . EGD  1999  . PANCREATIC CYST DRAINAGE     x 2  . s/p sinus surgury    . TONSILLECTOMY       OB History   None      Home Medications    Prior to Admission medications   Medication Sig Start Date End Date Taking? Authorizing Provider  albuterol (ACCUNEB) 0.63 MG/3ML nebulizer solution Take 3 mLs (0.63 mg total) by nebulization every 6 (six) hours as needed for wheezing. 06/28/17  Yes [name], MD  amLODipine (NORVASC) 5 MG tablet TAKE 1 TABLET EVERY DAY 05/06/18  Yes [name], MD  budesonide (RHINOCORT ALLERGY) 32 MCG/ACT nasal spray Place 1 spray into both nostrils daily as needed for rhinitis or allergies.    Yes [provider]  cyclobenzaprine (FLEXERIL) 10 MG tablet Take 1 tablet (10 mg total) by mouth 3 (three) times daily as needed for muscle spasms. 05/26/18  Yes [name], MD  Digestive Enzymes (PAPAYA AND ENZYMES) CHEW Chew 2 tablets by mouth 3 (three) times daily with meals as needed (digestion of big meals).    Yes [provider]  loratadine (CLARITIN) 10 MG tablet Take 10 mg by mouth daily.    Yes [provider]  losartan (COZAAR) 100 MG tablet TAKE 1 TABLET EVERY DAY 06/10/18  Yes [name], MD  Multiple Vitamins-Minerals [facility]) TABS Take 1 tablet by mouth daily.     Yes [provider]  Probiotic Product (PROBIOTIC DAILY PO) Take 1 tablet by mouth daily.   Yes [provider]  warfarin  (COUMADIN) 5 MG tablet TAKE AS DIRECTED BY ANTICOAGULATION CLINIC Patient taking differently: Take 2.5-5 mg by mouth daily. 5 mg on Mon and Fri 2.5 mg on Tues, Wed, Thurs, Sat and Sun 05/06/18  Yes [name], MD  amoxicillin (AMOXIL) 500 MG capsule Take 500 mg by mouth 2 (two) times daily. 07/22/18   [provider]  HYDROcodone-acetaminophen (NORCO/VICODIN) 5-325 MG tablet Take 1 tablet by mouth every 6 (six) hours as needed for severe pain. 07/30/18   Dona Walby, PA-C  meclizine (ANTIVERT) 12.5 MG tablet Take 1 tablet (12.5 mg total) by mouth 3 (three) times daily as needed for dizziness. Patient not taking: Reported on 07/30/2018 03/21/17   [name], MD  promethazine (PHENERGAN) 25 MG tablet Take 1 tablet (25 mg total) by mouth every 6 (six) hours as needed for nausea or vomiting. Patient not taking: Reported on 07/30/2018 11/07/17   [name], MD  sertraline (ZOLOFT) 50 MG tablet Take 1 tablet (50 mg total) by  mouth daily. 05/26/18   [name], MD  traMADol (ULTRAM) 50 MG tablet Take 1 tablet (50 mg total) by mouth every 8 (eight) hours as needed. Patient not taking: Reported on 07/30/2018 06/11/18   [name], MD  VENTOLIN HFA 108 (90 Base) MCG/ACT inhaler INHALE 2 PUFFS INTO THE LUNGS EVERY 6 HOURS AS NEEDED FOR WHEEZING OR SHORTNESS OF BREATH. Patient not taking: No sig reported 05/08/18   [name], MD    Family History Family History  Problem Relation Age of Onset  . Heart disease Mother   . Heart attack Mother        first one age 44  . Emphysema Father        was a smoker  . Stroke Father   . Breast cancer Daughter   . Lymphoma Sister   . Colon cancer Neg Hx   . Esophageal cancer Neg Hx   . Rectal cancer Neg Hx     Social History Social History   Tobacco Use  . Smoking status: Never Smoker  . Smokeless tobacco: Never Used  . Tobacco comment: spouse smoked in the home for 15 years  Substance Use Topics  . Alcohol use: No  . Drug use:  No     Allergies   Sulfonamide derivatives; Amoxicillin-pot clavulanate; Ciclesonide; Ciprofloxacin; and Raloxifene   Review of Systems Review of Systems  Musculoskeletal: Positive for back pain.  All other systems reviewed and are negative.    Physical Exam Updated Vital Signs BP (!) 156/70 (BP Location: Left Arm)   Pulse 92   Temp 98.2 F (36.8 C) (Oral)   Resp 15   SpO2 93%   Physical Exam  Constitutional: She is oriented to person, place, and time. She appears well-developed and well-nourished.  Elderly female in TLSO brace  HENT:  Head: Normocephalic and atraumatic.  Eyes: Pupils are equal, round, and reactive to light. Conjunctivae and EOM are normal.  Neck: Normal range of motion. Neck supple.  Cardiovascular: Normal rate, regular rhythm and intact distal pulses.  Pulmonary/Chest: Effort normal and breath sounds normal. No respiratory distress. She has no wheezes.  Abdominal: Soft. She exhibits no distension. There is no tenderness.  Musculoskeletal: Normal range of motion.  Tenderness palpation of low back along midline and bilateral musculature.  No step-offs or deformities.  No radiation of pain with straight leg raise.  Pedal pulses intact bilaterally.  Patellar reflexes intact bilaterally.  Strength intact bilaterally.  No saddle paresthesias.  Neurological: She is alert and oriented to person, place, and time. She displays normal reflexes. No sensory deficit.  Skin: Skin is warm and dry. Capillary refill takes less than 2 seconds.  Psychiatric: She has a normal mood and affect.  Nursing note and vitals reviewed.    ED Treatments / Results  Labs (all labs ordered are listed, but only abnormal results are displayed) Labs Reviewed  CBC WITH DIFFERENTIAL/PLATELET - Abnormal; Notable for the following components:      Result Value   WBC 11.1 (*)    Neutro Abs 8.7 (*)    All other components within normal limits  BASIC METABOLIC PANEL - Abnormal; Notable for  the following components:   Sodium 127 (*)    Chloride 88 (*)    Glucose, Bld 115 (*)    All other components within normal limits  URINALYSIS, ROUTINE W REFLEX MICROSCOPIC - Abnormal; Notable for the following components:   Hgb urine dipstick LARGE (*)    Ketones,  ur 5 (*)    Bacteria, UA FEW (*)    All other components within normal limits  URINE CULTURE    EKG None  Radiology Ct Lumbar Spine Wo Contrast  Result Date: 07/30/2018 CLINICAL DATA:  84 year old female with recent T8 compression fracture documented on September thoracic spine MR. New sharp low back pain after bending 3 days ago. Pain is severe. EXAM: CT LUMBAR SPINE WITHOUT CONTRAST TECHNIQUE: Multidetector CT imaging of the lumbar spine was performed without intravenous contrast administration. Multiplanar CT image reconstructions were also generated. COMPARISON:  Thoracic spine radiographs 923 and thoracic spine MRI 06/17/2018. FINDINGS: Segmentation: Lumbar segmentation appears to be normal and will be designated as such for this report. Alignment: Relatively preserved lumbar lordosis. Slight dextroconvex lumbar curvature. Vertebrae: Diffuse osteopenia. There is mild compression of both the L3 and L4 vertebral bodies with 20-30% loss of height at each level. The L3 superior endplate is compressed and mildly sclerotic. There is minor retropulsion of the posterosuperior L3 endplate. The pedicles and posterior elements appear intact. The L4 inferior endplate is compressed, and this level has a more chronic appearance. No significant retropulsion. The L4 pedicles and posterior elements appear intact. The other lumbar levels appear intact. Visible sacrum and SI joints appear intact. Paraspinal and other soft tissues: Aortoiliac calcified atherosclerosis. Vascular patency is not evaluated in the absence of IV contrast. Diverticulosis of the sigmoid colon. Otherwise negative visible noncontrast abdominal viscera. Negative visualized  posterior paraspinal soft tissues. Disc levels: Lumbar spine degeneration notable for: L2-L3: Mild to moderate spinal stenosis related to disc bulge, mild bony retropulsion, and posterior element hypertrophy. L3-L4: Moderate appearing spinal stenosis related to disc bulge and posterior element hypertrophy. L4-L5: Moderate appearing spinal stenosis related to disc bulge and posterior element hypertrophy. L5-S1: Severe disc space loss with vacuum disc and partially calcified right paracentral and caudal disc extrusion. The right lateral recess appears most affected with otherwise mild spinal stenosis. IMPRESSION: 1. Mild compression fractures of L3 and L4 are age indeterminate, but of these the L3 level appears more recent. If specific therapy is desired, Lumbar MRI without contrast or Nuclear Medicine Whole-body Bone Scan would confirm candidacy for vertebroplasty. 2. Underlying osteopenia, multilevel degenerative lumbar spinal stenosis. Electronically Signed   By: Genevie Ann M.D.   On: 07/30/2018 17:05    Procedures Procedures (including critical care time)  Medications Ordered in ED Medications  HYDROcodone-acetaminophen (NORCO/VICODIN) 5-325 MG per tablet 0.5 tablet (has no administration in time range)     Initial Impression / Assessment and Plan / ED Course  I have reviewed the triage vital signs and the nursing notes.  Pertinent labs & imaging results that were available during my care of the patient were reviewed by me and considered in my medical decision making (see chart for details).     Patient presenting for evaluation of acute low back pain which began all of a sudden 3 days ago after leaning forward.  Physical exam shows elderly female with midline and bilateral low back pain.  History of osteoporosis and compression fractures.  As patient is elderly with acute sharp pain, will obtain CT for further evaluation.  Will obtain labs and urine for further evaluation of urinary  symptoms.  CT shows L3-L4 compression fractures, L3 appears more recent. No neuro deficits, doubt spinal cord compression, myelopathy, or cauda equina syndrome. Labs show low Na and Cl, creatinine stable.  UA without infection.  Labs otherwise reassuring. Case discussed with attending, Dr. Johnney Killian evaluated the  pt.   Discussed findings with pt and daughter.  Discussed compression fractures and symptomatic treatment with pain control.  Discussed use of Norco, with concurrent tool softener as needed.  Discussed patient's sodium and chloride were low, patient should slightly restrict fluid intake and increase salt intake, and closely follow-up with PCP.  Patient and daughter are agreeable to plan.  Patient to follow-up with neurosurgery regarding lumbar fractures.  Patient given strict return precautions, including red signs.  At this time, patient appears safe for discharge.  Return precautions given.  Patient and daughter state they understand and agree to plan.  Final Clinical Impressions(s) / ED Diagnoses   Final diagnoses:  Closed compression fracture of body of lumbar vertebra (HCC)  Acute hyponatremia    ED Discharge Orders         Ordered    HYDROcodone-acetaminophen (NORCO/VICODIN) 5-325 MG tablet  Every 6 hours PRN     07/30/18 1853           Franchot Heidelberg, PA-C 07/30/18 2142    Charlesetta Shanks, MD 07/31/18 [phone]

## 2018-07-31 LAB — URINE CULTURE

## 2018-08-01 NOTE — ED Notes (Signed)
08/01/2018, pt. Called and reported that her RX of Vicodin was not called in per discharge instructions and she needed another RX called in. (Pt. Left a message on this RN's voicemail)  Rn called [facility] to see if the RX was received and the reported that the pt. Picked up the medication on 07/01/2018/

## 2018-08-04 DIAGNOSIS — S32030A Wedge compression fracture of third lumbar vertebra, initial encounter for closed fracture: Secondary | ICD-10-CM | POA: Diagnosis not present

## 2018-08-05 ENCOUNTER — Ambulatory Visit: Payer: Medicare Other

## 2018-08-06 DIAGNOSIS — S32030A Wedge compression fracture of third lumbar vertebra, initial encounter for closed fracture: Secondary | ICD-10-CM | POA: Diagnosis not present

## 2018-08-11 ENCOUNTER — Other Ambulatory Visit: Payer: Self-pay | Admitting: Neurosurgery

## 2018-08-13 NOTE — Pre-Procedure Instructions (Signed)
[name]  08/13/2018      [id number] - [place] [place] [address] [place] [address] Phone: [phone] Fax: [phone]  [facility] - [address], [place] [address] Phone: [phone] Fax: [phone]    Your procedure is scheduled on Wed., Nov. 13, 2019 from 2:36PM-3:56PM  Report to [facility] Admitting Entrance "A" at 12:30PM  Call this number if you have problems the morning of surgery:  [phone]   Remember:  Do not eat or drink after midnight on Nov. 12th    Take these medicines the morning of surgery with A SIP OF WATER: AmLODipine (NORVASC), Loratadine (CLARITIN), Sertraline (ZOLOFT), and   Budesonide (RHINOCORT ALLERGY)   If needed: HYDROcodone-acetaminophen (NORCO/VICODIN), Cyclobenzaprine (FLEXERIL), and Albuterol Inhaler-Bring with you the day of surgery  Stop taking Warfarin (COUMADIN) on (08/15/18) 5 days prior to surgery, per Dr. [name].  As of today, stop taking all Other Aspirin Products, Vitamins, Fish oils, and Herbal medications. Also stop all NSAIDS i.e. Advil, Ibuprofen, Motrin, Aleve, Anaprox, Naproxen, BC, Goody Powders, and all Supplements.   Do not wear jewelry, make-up or nail polish.  Do not wear lotions, powders, or perfumes, or deodorant.  Do not shave 48 hours prior to surgery.    Do not bring valuables to the hospital.  [facility] is not responsible for any belongings or valuables.  Contacts, dentures or bridgework may not be worn into surgery.  Leave your suitcase in the car.  After surgery it may be brought to your room.  For patients admitted to the hospital, discharge time will be determined by your treatment team.  Patients discharged the day of surgery will not be allowed to drive home.   Special instructions:   [facility]- Preparing For Surgery  Before surgery, you can play an important role. Because skin is  not sterile, your skin needs to be as free of germs as possible. You can reduce the number of germs on your skin by washing with CHG (chlorahexidine gluconate) Soap before surgery.  CHG is an antiseptic cleaner which kills germs and bonds with the skin to continue killing germs even after washing.    Oral Hygiene is also important to reduce your risk of infection.  Remember - BRUSH YOUR TEETH THE MORNING OF SURGERY WITH YOUR REGULAR TOOTHPASTE  Please do not use if you have an allergy to CHG or antibacterial soaps. If your skin becomes reddened/irritated stop using the CHG.  Do not shave (including legs and underarms) for at least 48 hours prior to first CHG shower. It is OK to shave your face.  Please follow these instructions carefully.   1. Shower the NIGHT BEFORE SURGERY and the MORNING OF SURGERY with CHG.   2. If you chose to wash your hair, wash your hair first as usual with your normal shampoo.  3. After you shampoo, rinse your hair and body thoroughly to remove the shampoo.  4. Use CHG as you would any other liquid soap. You can apply CHG directly to the skin and wash gently with a scrungie or a clean washcloth.   5. Apply the CHG Soap to your body ONLY FROM THE NECK DOWN.  Do not use on open wounds or open sores. Avoid contact with your eyes, ears, mouth and genitals (private parts). Wash Face and genitals (private parts)  with your normal soap.  6. Wash thoroughly, paying  special attention to the area where your surgery will be performed.  7. Thoroughly rinse your body with warm water from the neck down.  8. DO NOT shower/wash with your normal soap after using and rinsing off the CHG Soap.  9. Pat yourself dry with a CLEAN TOWEL.  10. Wear CLEAN PAJAMAS to bed the night before surgery, wear comfortable clothes the morning of surgery  11. Place CLEAN SHEETS on your bed the night of your first shower and DO NOT SLEEP WITH PETS.  Day of Surgery:  Do not apply any  deodorants/lotions.  Please wear clean clothes to the hospital/surgery center.   Remember to brush your teeth WITH YOUR REGULAR TOOTHPASTE.  Please read over the following fact sheets that you were given. Pain Booklet, Coughing and Deep Breathing, MRSA Information and Surgical Site Infection Prevention

## 2018-08-14 ENCOUNTER — Other Ambulatory Visit: Payer: Self-pay

## 2018-08-14 ENCOUNTER — Encounter (HOSPITAL_COMMUNITY)
Admission: RE | Admit: 2018-08-14 | Discharge: 2018-08-14 | Disposition: A | Discharge status: Home / Self Care | Payer: Medicare Other | Source: Ambulatory Visit | Attending: Neurosurgery | Admitting: Neurosurgery

## 2018-08-14 ENCOUNTER — Encounter (HOSPITAL_COMMUNITY): Payer: Self-pay

## 2018-08-14 DIAGNOSIS — I10 Essential (primary) hypertension: Secondary | ICD-10-CM | POA: Insufficient documentation

## 2018-08-14 DIAGNOSIS — Z01818 Encounter for other preprocedural examination: Secondary | ICD-10-CM | POA: Diagnosis not present

## 2018-08-14 DIAGNOSIS — M4856XA Collapsed vertebra, not elsewhere classified, lumbar region, initial encounter for fracture: Secondary | ICD-10-CM | POA: Insufficient documentation

## 2018-08-14 DIAGNOSIS — S32030A Wedge compression fracture of third lumbar vertebra, initial encounter for closed fracture: Secondary | ICD-10-CM | POA: Diagnosis not present

## 2018-08-14 DIAGNOSIS — M4854XA Collapsed vertebra, not elsewhere classified, thoracic region, initial encounter for fracture: Secondary | ICD-10-CM | POA: Diagnosis not present

## 2018-08-14 DIAGNOSIS — R030 Elevated blood-pressure reading, without diagnosis of hypertension: Secondary | ICD-10-CM | POA: Diagnosis not present

## 2018-08-14 HISTORY — DX: Unspecified asthma, uncomplicated: J45.909

## 2018-08-14 HISTORY — DX: Dyspnea, unspecified: R06.00

## 2018-08-14 LAB — SURGICAL PCR SCREEN
MRSA, PCR: NEGATIVE
STAPHYLOCOCCUS AUREUS: NEGATIVE

## 2018-08-14 LAB — BASIC METABOLIC PANEL
Anion gap: 10 (ref 5–15)
BUN: 14 mg/dL (ref 8–23)
CALCIUM: 9.3 mg/dL (ref 8.9–10.3)
CO2: 25 mmol/L (ref 22–32)
CREATININE: 0.55 mg/dL (ref 0.44–1.00)
Chloride: 94 mmol/L — ABNORMAL LOW (ref 98–111)
GFR calc Af Amer: >60 mL/min (ref >60)
GLUCOSE: 109 mg/dL — AB (ref 70–99)
Potassium: 3.9 mmol/L (ref 3.5–5.1)
SODIUM: 129 mmol/L — AB (ref 135–145)

## 2018-08-14 LAB — CBC
HCT: 42.7 % (ref 36.0–46.0)
Hemoglobin: 13.6 g/dL (ref 12.0–15.0)
MCH: 30.6 pg (ref 26.0–34.0)
MCHC: 31.9 g/dL (ref 30.0–36.0)
MCV: 96 fL (ref 80.0–100.0)
PLATELETS: 335 10*3/uL (ref 150–400)
RBC: 4.45 MIL/uL (ref 3.87–5.11)
RDW: 13.5 % (ref 11.5–15.5)
WBC: 10.6 10*3/uL — ABNORMAL HIGH (ref 4.0–10.5)
nRBC: 0 % (ref 0.0–0.2)

## 2018-08-14 NOTE — Progress Notes (Deleted)
PCP -  Sharmon Leyden MD Cardiologist - Dr. Gerarda Gunther  Chest x-ray - 08/14/18 EKG - 08/14/18 Stress Test - 2017  ECHO - 2019 Cardiac Cath - 2017  CPAP -  Bringing mask and hose  Fasting Blood Sugar - 90s-100s Checks Blood Sugar 1 time every few weeks  Blood Thinner Instructions: N/A Aspirin Instructions: will call Dr. Gerarda Gunther for instructions  Anesthesia review: yes, heart hx, requested documents. Previous note from [place] in 2017  Patient denies shortness of breath, fever, cough and chest pain at PAT appointment   Patient verbalized understanding of instructions that were given to them at the PAT appointment. Patient was also instructed that they will need to review over the PAT instructions again at home before surgery.

## 2018-08-14 NOTE — Progress Notes (Signed)
PCP - Cathlean Cower MD  Chest x-ray - 05/09/18 EKG - 08/14/18 Stress Test - 2012 ECHO - 2013   Blood Thinner Instructions: Stopping coumadin on 08/15/18 Aspirin Instructions: N/A   Anesthesia review: yes, hx CAD  Patient denies shortness of breath, fever, cough and chest pain at PAT appointment   Patient verbalized understanding of instructions that were given to them at the PAT appointment. Patient was also instructed that they will need to review over the PAT instructions again at home before surgery.   '

## 2018-08-15 NOTE — Anesthesia Preprocedure Evaluation (Addendum)
Anesthesia Evaluation  Patient identified by MRN, date of birth, ID band Patient awake    Reviewed: Allergy & Precautions, NPO status , Patient's Chart, lab work & pertinent test results  Airway Mallampati: II  TM Distance: >3 FB     Dental   Pulmonary shortness of breath, asthma , COPD,    breath sounds clear to auscultation       Cardiovascular hypertension, + CAD   Rhythm:Regular Rate:Normal     Neuro/Psych    GI/Hepatic Neg liver ROS, GERD  ,  Endo/Other  negative endocrine ROS  Renal/GU negative Renal ROS     Musculoskeletal  (+) Arthritis ,   Abdominal   Peds  Hematology   Anesthesia Other Findings   Reproductive/Obstetrics                            Anesthesia Physical Anesthesia Plan  ASA: III  Anesthesia Plan: General   Post-op Pain Management:    Induction: Intravenous  PONV Risk Score and Plan: Ondansetron, Midazolam, Dexamethasone and Treatment may vary due to age or medical condition  Airway Management Planned: Oral ETT  Additional Equipment:   Intra-op Plan:   Post-operative Plan: Possible Post-op intubation/ventilation  Informed Consent: I have reviewed the patients History and Physical, chart, labs and discussed the procedure including the risks, benefits and alternatives for the proposed anesthesia with the patient or authorized representative who has indicated his/her understanding and acceptance.   Dental advisory given  Plan Discussed with: CRNA and Anesthesiologist  Anesthesia Plan Comments: (See PAT note 08/14/2018 by Karoline Caldwell, PA-C )      Anesthesia Quick Evaluation

## 2018-08-15 NOTE — Progress Notes (Signed)
Anesthesia Chart Review:  Case:  [id number] Date/Time:  08/20/18 0815   Procedure:  Lumbar 3 KYPHOPLASTY (N/A ) - Lumbar 3 KYPHOPLASTY   Anesthesia type:  General   Pre-op diagnosis:  Lumbar compression fracture   Location:  [facility] OR ROOM 18 / [place] [facility]   Surgeon:  Ashok Pall, MD      DISCUSSION: 84 yo female never smoker. Pertinent hx includes HTN, COPD (Mod-severe  with bronchiectasis by CT), PE (bilateral, 2008 with asociated Pulm HTN (PASP 70 with intact LV fxn), Stroke (appears to have been added to her list in 2015 although I cannot find record in her chart of this event).  Pt has medical clearance from PCP Dr. Cathlean Cower stating she is at moderate risk and can hold coumadin x5d.  She previously followed with cardiology for PE and pulm htn. Per notes the pulm htn was presumed secondary to bilateral PE. She had a low risk nuclear stress in 2015. No longer following with cardiology, continues with pulmonology and PCP for management.   Per pulm notes she is not on continuous O2 but is recommended to use for exertion.  Anticipate she can proceed as planned barring acute status change.   VS: BP (!) 146/67   Pulse 95   Temp 36.9 C   Resp 18   Ht 5\' 5"  (1.651 m)   Wt 63 kg   SpO2 90% Comment: pt states that shes on o2 at night and she's not SOB at the moment. pt states its hard to breathe with the brace on. notified [place] RN  BMI 23.10 kg/m   PROVIDERS: [name], MD is PCP  Baltazar Apo, MD is Pulmonologist last seen 04/30/2017  LABS: Labs reviewed: Acceptable for surgery. (all labs ordered are listed, but only abnormal results are displayed)  Labs Reviewed  BASIC METABOLIC PANEL - Abnormal; Notable for the following components:      Result Value   Sodium 129 (*)    Chloride 94 (*)    Glucose, Bld 109 (*)    All other components within normal limits  CBC - Abnormal; Notable for the following components:   WBC 10.6 (*)    All other components within normal limits   SURGICAL PCR SCREEN     IMAGES: CHEST - 2 VIEW 05/08/2018:  COMPARISON:  Chest x-ray and chest CT exams on 01/16/2017  FINDINGS: The heart size and mediastinal contours are within normal limits. Stable advanced chronic lung disease and bilateral pulmonary hyperinflation. Stable volume loss and bronchiectasis in the right middle lobe. No pneumothorax or pleural fluid. Stable dilated central pulmonary arteries consistent with underlying pulmonary hypertension. The bony thorax is unremarkable. The visualized skeletal structures are unremarkable.  IMPRESSION: Stable advanced chronic lung disease. There is stable volume loss and bronchiectasis in the right middle lobe.   EKG: 04/17/2018:  NSR. Rate 90.  CV: Nuclear stress 04/20/2014: Overall Impression:  Low risk stress nuclear study with normal perfusion. TID, however, is abnormal at 1.42, which could indicate global ischemia.  LV Ejection Fraction: 76%.  LV Wall Motion:  NL LV Function; NL Wall Motion  TTE 11/28/2011: Study Conclusions  - Left ventricle: The cavity size was normal. Wall thickness was normal. Systolic function was normal. The estimated ejection fraction was in the range of 55% to 60%. - Left atrium: The atrium was mildly dilated. - Right ventricle: The cavity size was mildly dilated. - Right atrium: The atrium was mildly dilated. - Atrial septum: No defect or patent  foramen ovale was identified. - Pulmonary arteries: PA peak pressure: 21mm Hg (S). - Pericardium, extracardiac: A trivial pericardial effusion was identified. - Impressions: Significant elevation on PA pressure based on TR velocity Impressions:  - Significant elevation on PA pressure based on TR velocity  Past Medical History:  Diagnosis Date  . ALLERGIC RHINITIS   . ANXIETY   . Asthma   . Bronchiectasis 12/11/2011   CT chest dx  . CAROTID ARTERY STENOSIS, RIGHT   . Chronic idiopathic constipation   . Chronic rhinitis    . COMMON MIGRAINE   . COPD   . CORONARY ARTERY DISEASE   . CYST/PSEUDOCYST, PANCREAS   . DEPRESSION   . DIVERTICULOSIS, COLON   . DIZZINESS, CHRONIC   . Dyspnea    on exertion  . Esophageal stricture   . GERD   . HYPERLIPIDEMIA   . HYPERTENSION   . Internal hemorrhoids with complication   . OSTEOPOROSIS   . OVERACTIVE BLADDER   . Pulmonary embolism ([place]) 2008  . PULMONARY HYPERTENSION, SECONDARY   . Stroke ([place])   . Tubular adenoma of colon 10/2013  . Unspecified hearing loss     Past Surgical History:  Procedure Laterality Date  . ABDOMINAL HYSTERECTOMY  1965  . APPENDECTOMY  1963  . CHOLECYSTECTOMY  1963  . COLONOSCOPY N/A 10/19/2013   Procedure: COLONOSCOPY;  Surgeon: Ladene Artist, MD;  Location: [facility];  Service: Endoscopy;  Laterality: N/A;  . EGD  1999  . PANCREATIC CYST DRAINAGE     x 2  . ROOT CANAL    . s/p sinus surgury    . TONSILLECTOMY      MEDICATIONS: . albuterol (ACCUNEB) 0.63 MG/3ML nebulizer solution  . amLODipine (NORVASC) 5 MG tablet  . amoxicillin (AMOXIL) 500 MG capsule  . budesonide (RHINOCORT ALLERGY) 32 MCG/ACT nasal spray  . cyclobenzaprine (FLEXERIL) 10 MG tablet  . Digestive Enzymes (PAPAYA AND ENZYMES) CHEW  . HYDROcodone-acetaminophen (NORCO/VICODIN) 5-325 MG tablet  . loratadine (CLARITIN) 10 MG tablet  . losartan (COZAAR) 100 MG tablet  . meclizine (ANTIVERT) 12.5 MG tablet  . Multiple Vitamins-Minerals (MACUVITE) TABS  . Probiotic Product (PROBIOTIC DAILY PO)  . promethazine (PHENERGAN) 25 MG tablet  . sertraline (ZOLOFT) 50 MG tablet  . traMADol (ULTRAM) 50 MG tablet  . VENTOLIN HFA 108 (90 Base) MCG/ACT inhaler  . warfarin (COUMADIN) 5 MG tablet   No current facility-administered medications for this encounter.    Wynonia Musty [facility] Short Stay Center/Anesthesiology Phone [phone] 08/15/2018 11:21 AM

## 2018-08-20 ENCOUNTER — Ambulatory Visit (HOSPITAL_COMMUNITY): Payer: Medicare Other | Admitting: Physician Assistant

## 2018-08-20 ENCOUNTER — Other Ambulatory Visit: Payer: Self-pay

## 2018-08-20 ENCOUNTER — Encounter (HOSPITAL_COMMUNITY)
Admission: RE | Disposition: A | Discharge status: Home / Self Care | Payer: Self-pay | Source: Ambulatory Visit | Attending: Neurosurgery

## 2018-08-20 ENCOUNTER — Ambulatory Visit (HOSPITAL_COMMUNITY): Payer: Medicare Other | Admitting: Certified Registered"

## 2018-08-20 ENCOUNTER — Encounter (HOSPITAL_COMMUNITY): Payer: Self-pay

## 2018-08-20 ENCOUNTER — Ambulatory Visit (HOSPITAL_COMMUNITY)
Admission: RE | Admit: 2018-08-20 | Discharge: 2018-08-21 | Disposition: A | Discharge status: Home / Self Care | Payer: Medicare Other | Source: Ambulatory Visit | Attending: Neurosurgery | Admitting: Neurosurgery

## 2018-08-20 ENCOUNTER — Ambulatory Visit (HOSPITAL_COMMUNITY): Payer: Medicare Other

## 2018-08-20 DIAGNOSIS — Z86711 Personal history of pulmonary embolism: Secondary | ICD-10-CM | POA: Insufficient documentation

## 2018-08-20 DIAGNOSIS — J449 Chronic obstructive pulmonary disease, unspecified: Secondary | ICD-10-CM | POA: Diagnosis not present

## 2018-08-20 DIAGNOSIS — Z8249 Family history of ischemic heart disease and other diseases of the circulatory system: Secondary | ICD-10-CM | POA: Diagnosis not present

## 2018-08-20 DIAGNOSIS — I2510 Atherosclerotic heart disease of native coronary artery without angina pectoris: Secondary | ICD-10-CM | POA: Insufficient documentation

## 2018-08-20 DIAGNOSIS — S32030A Wedge compression fracture of third lumbar vertebra, initial encounter for closed fracture: Secondary | ICD-10-CM | POA: Diagnosis present

## 2018-08-20 DIAGNOSIS — M549 Dorsalgia, unspecified: Secondary | ICD-10-CM | POA: Insufficient documentation

## 2018-08-20 DIAGNOSIS — R531 Weakness: Secondary | ICD-10-CM | POA: Diagnosis not present

## 2018-08-20 DIAGNOSIS — Z79899 Other long term (current) drug therapy: Secondary | ICD-10-CM | POA: Insufficient documentation

## 2018-08-20 DIAGNOSIS — Z9049 Acquired absence of other specified parts of digestive tract: Secondary | ICD-10-CM | POA: Insufficient documentation

## 2018-08-20 DIAGNOSIS — Z7901 Long term (current) use of anticoagulants: Secondary | ICD-10-CM | POA: Diagnosis not present

## 2018-08-20 DIAGNOSIS — M1990 Unspecified osteoarthritis, unspecified site: Secondary | ICD-10-CM | POA: Diagnosis not present

## 2018-08-20 DIAGNOSIS — Z419 Encounter for procedure for purposes other than remedying health state, unspecified: Secondary | ICD-10-CM

## 2018-08-20 DIAGNOSIS — K219 Gastro-esophageal reflux disease without esophagitis: Secondary | ICD-10-CM | POA: Insufficient documentation

## 2018-08-20 DIAGNOSIS — S32038A Other fracture of third lumbar vertebra, initial encounter for closed fracture: Secondary | ICD-10-CM | POA: Diagnosis not present

## 2018-08-20 DIAGNOSIS — H9319 Tinnitus, unspecified ear: Secondary | ICD-10-CM | POA: Insufficient documentation

## 2018-08-20 DIAGNOSIS — M545 Low back pain: Secondary | ICD-10-CM | POA: Diagnosis not present

## 2018-08-20 DIAGNOSIS — E7800 Pure hypercholesterolemia, unspecified: Secondary | ICD-10-CM | POA: Diagnosis not present

## 2018-08-20 DIAGNOSIS — Z90710 Acquired absence of both cervix and uterus: Secondary | ICD-10-CM | POA: Diagnosis not present

## 2018-08-20 DIAGNOSIS — Z7951 Long term (current) use of inhaled steroids: Secondary | ICD-10-CM | POA: Diagnosis not present

## 2018-08-20 DIAGNOSIS — Y9389 Activity, other specified: Secondary | ICD-10-CM | POA: Insufficient documentation

## 2018-08-20 DIAGNOSIS — I10 Essential (primary) hypertension: Secondary | ICD-10-CM | POA: Diagnosis not present

## 2018-08-20 DIAGNOSIS — X500XXA Overexertion from strenuous movement or load, initial encounter: Secondary | ICD-10-CM | POA: Diagnosis not present

## 2018-08-20 DIAGNOSIS — M4856XA Collapsed vertebra, not elsewhere classified, lumbar region, initial encounter for fracture: Secondary | ICD-10-CM | POA: Diagnosis not present

## 2018-08-20 HISTORY — PX: KYPHOPLASTY: SHX5884

## 2018-08-20 LAB — PROTIME-INR
INR: 0.99
Prothrombin Time: 13 seconds (ref 11.4–15.2)

## 2018-08-20 SURGERY — KYPHOPLASTY
Anesthesia: General

## 2018-08-20 MED ORDER — ROCURONIUM BROMIDE 50 MG/5ML IV SOSY
PREFILLED_SYRINGE | INTRAVENOUS | Status: AC
  Filled 2018-08-20: qty 5

## 2018-08-20 MED ORDER — EPHEDRINE SULFATE-NACL 50-0.9 MG/10ML-% IV SOSY
PREFILLED_SYRINGE | INTRAVENOUS | Status: DC | PRN
  Administered 2018-08-20 (×2): 15 mg via INTRAVENOUS

## 2018-08-20 MED ORDER — VANCOMYCIN HCL IN DEXTROSE 1-5 GM/200ML-% IV SOLN
1000.0000 mg | INTRAVENOUS | Status: AC
  Administered 2018-08-20: 1000 mg via INTRAVENOUS
  Filled 2018-08-20: qty 200

## 2018-08-20 MED ORDER — DEXAMETHASONE SODIUM PHOSPHATE 10 MG/ML IJ SOLN
INTRAMUSCULAR | Status: DC | PRN
  Administered 2018-08-20: 10 mg via INTRAVENOUS

## 2018-08-20 MED ORDER — CYCLOBENZAPRINE HCL 10 MG PO TABS
10.0000 mg | ORAL_TABLET | Freq: Three times a day (TID) | ORAL | Status: DC | PRN
  Administered 2018-08-20 – 2018-08-21 (×2): 10 mg via ORAL
  Filled 2018-08-20 (×2): qty 1

## 2018-08-20 MED ORDER — IOPAMIDOL (ISOVUE-300) INJECTION 61%
INTRAVENOUS | Status: AC
  Filled 2018-08-20: qty 50

## 2018-08-20 MED ORDER — ADULT MULTIVITAMIN W/MINERALS CH
1.0000 | ORAL_TABLET | Freq: Every day | ORAL | Status: DC
  Administered 2018-08-21: 1 via ORAL
  Filled 2018-08-20 (×2): qty 1

## 2018-08-20 MED ORDER — SODIUM CHLORIDE 0.9 % IV SOLN
INTRAVENOUS | Status: DC | PRN
  Administered 2018-08-20: 50 ug/min via INTRAVENOUS

## 2018-08-20 MED ORDER — PROMETHAZINE HCL 25 MG PO TABS: 25.0000 mg | ORAL_TABLET | Freq: Four times a day (QID) | ORAL | Status: DC | PRN

## 2018-08-20 MED ORDER — FLUTICASONE PROPIONATE 50 MCG/ACT NA SUSP
1.0000 | Freq: Every day | NASAL | Status: DC
  Filled 2018-08-20: qty 16

## 2018-08-20 MED ORDER — LIDOCAINE 2% (20 MG/ML) 5 ML SYRINGE
INTRAMUSCULAR | Status: DC | PRN
  Administered 2018-08-20: 60 mg via INTRAVENOUS

## 2018-08-20 MED ORDER — IOPAMIDOL (ISOVUE-300) INJECTION 61%
INTRAVENOUS | Status: DC | PRN
  Administered 2018-08-20: 50 mL via INTRAVENOUS

## 2018-08-20 MED ORDER — ALBUTEROL SULFATE (2.5 MG/3ML) 0.083% IN NEBU: 3.0000 mL | INHALATION_SOLUTION | Freq: Four times a day (QID) | RESPIRATORY_TRACT | Status: DC | PRN

## 2018-08-20 MED ORDER — ALBUTEROL SULFATE HFA 108 (90 BASE) MCG/ACT IN AERS: 2.0000 | INHALATION_SPRAY | Freq: Four times a day (QID) | RESPIRATORY_TRACT | Status: DC | PRN

## 2018-08-20 MED ORDER — LOSARTAN POTASSIUM 50 MG PO TABS
100.0000 mg | ORAL_TABLET | Freq: Every day | ORAL | Status: DC
  Filled 2018-08-20: qty 2

## 2018-08-20 MED ORDER — MIDAZOLAM HCL 2 MG/2ML IJ SOLN
INTRAMUSCULAR | Status: AC
  Filled 2018-08-20: qty 2

## 2018-08-20 MED ORDER — CHLORHEXIDINE GLUCONATE CLOTH 2 % EX PADS: 6.0000 | MEDICATED_PAD | Freq: Once | CUTANEOUS | Status: DC

## 2018-08-20 MED ORDER — MECLIZINE HCL 12.5 MG PO TABS
12.5000 mg | ORAL_TABLET | Freq: Three times a day (TID) | ORAL | Status: DC | PRN
  Filled 2018-08-20: qty 1

## 2018-08-20 MED ORDER — AMLODIPINE BESYLATE 5 MG PO TABS
5.0000 mg | ORAL_TABLET | Freq: Every day | ORAL | Status: DC
  Filled 2018-08-20: qty 1

## 2018-08-20 MED ORDER — PHENYLEPHRINE 40 MCG/ML (10ML) SYRINGE FOR IV PUSH (FOR BLOOD PRESSURE SUPPORT)
PREFILLED_SYRINGE | INTRAVENOUS | Status: AC
  Filled 2018-08-20: qty 10

## 2018-08-20 MED ORDER — PROPOFOL 10 MG/ML IV BOLUS
INTRAVENOUS | Status: DC | PRN
  Administered 2018-08-20: 100 mg via INTRAVENOUS

## 2018-08-20 MED ORDER — LIDOCAINE-EPINEPHRINE 0.5 %-1:200000 IJ SOLN
INTRAMUSCULAR | Status: DC | PRN
  Administered 2018-08-20: 8 mL

## 2018-08-20 MED ORDER — FENTANYL CITRATE (PF) 100 MCG/2ML IJ SOLN: 25.0000 ug | INTRAMUSCULAR | Status: DC | PRN

## 2018-08-20 MED ORDER — LIDOCAINE-EPINEPHRINE 0.5 %-1:200000 IJ SOLN
INTRAMUSCULAR | Status: AC
  Filled 2018-08-20: qty 1

## 2018-08-20 MED ORDER — POTASSIUM CHLORIDE IN NACL 20-0.9 MEQ/L-% IV SOLN: INTRAVENOUS | Status: DC

## 2018-08-20 MED ORDER — HYDROCODONE-ACETAMINOPHEN 5-325 MG PO TABS
1.0000 | ORAL_TABLET | Freq: Four times a day (QID) | ORAL | Status: DC | PRN
  Administered 2018-08-20: 1 via ORAL
  Filled 2018-08-20: qty 1

## 2018-08-20 MED ORDER — FENTANYL CITRATE (PF) 100 MCG/2ML IJ SOLN
INTRAMUSCULAR | Status: DC | PRN
  Administered 2018-08-20 (×3): 50 ug via INTRAVENOUS

## 2018-08-20 MED ORDER — SUGAMMADEX SODIUM 200 MG/2ML IV SOLN
INTRAVENOUS | Status: AC
  Filled 2018-08-20: qty 2

## 2018-08-20 MED ORDER — LACTATED RINGERS IV SOLN
INTRAVENOUS | Status: DC
  Administered 2018-08-20: 07:00:00 via INTRAVENOUS

## 2018-08-20 MED ORDER — SUGAMMADEX SODIUM 200 MG/2ML IV SOLN
INTRAVENOUS | Status: DC | PRN
  Administered 2018-08-20: 270 mg via INTRAVENOUS

## 2018-08-20 MED ORDER — DEXAMETHASONE SODIUM PHOSPHATE 10 MG/ML IJ SOLN
INTRAMUSCULAR | Status: AC
  Filled 2018-08-20: qty 1

## 2018-08-20 MED ORDER — LORATADINE 10 MG PO TABS
10.0000 mg | ORAL_TABLET | Freq: Every day | ORAL | Status: DC
  Administered 2018-08-21: 10 mg via ORAL
  Filled 2018-08-20: qty 1

## 2018-08-20 MED ORDER — LIDOCAINE 2% (20 MG/ML) 5 ML SYRINGE
INTRAMUSCULAR | Status: AC
  Filled 2018-08-20: qty 10

## 2018-08-20 MED ORDER — ONDANSETRON HCL 4 MG/2ML IJ SOLN
INTRAMUSCULAR | Status: DC | PRN
  Administered 2018-08-20: 4 mg via INTRAVENOUS

## 2018-08-20 MED ORDER — SERTRALINE HCL 50 MG PO TABS: 50.0000 mg | ORAL_TABLET | Freq: Every day | ORAL | Status: DC

## 2018-08-20 MED ORDER — FENTANYL CITRATE (PF) 250 MCG/5ML IJ SOLN
INTRAMUSCULAR | Status: AC
  Filled 2018-08-20: qty 5

## 2018-08-20 MED ORDER — HYDROCODONE-ACETAMINOPHEN 5-325 MG PO TABS
1.0000 | ORAL_TABLET | ORAL | Status: DC | PRN
  Administered 2018-08-21 (×2): 1 via ORAL
  Filled 2018-08-20 (×2): qty 1

## 2018-08-20 MED ORDER — PROPOFOL 10 MG/ML IV BOLUS
INTRAVENOUS | Status: AC
  Filled 2018-08-20: qty 20

## 2018-08-20 MED ORDER — ROCURONIUM BROMIDE 50 MG/5ML IV SOSY
PREFILLED_SYRINGE | INTRAVENOUS | Status: DC | PRN
  Administered 2018-08-20: 30 mg via INTRAVENOUS
  Administered 2018-08-20: 20 mg via INTRAVENOUS

## 2018-08-20 MED ORDER — PAPAYA AND ENZYMES PO CHEW: 2.0000 | CHEWABLE_TABLET | Freq: Three times a day (TID) | ORAL | Status: DC | PRN

## 2018-08-20 MED ORDER — LIDOCAINE 2% (20 MG/ML) 5 ML SYRINGE
INTRAMUSCULAR | Status: AC
  Filled 2018-08-20: qty 5

## 2018-08-20 MED ORDER — PHENYLEPHRINE 40 MCG/ML (10ML) SYRINGE FOR IV PUSH (FOR BLOOD PRESSURE SUPPORT)
PREFILLED_SYRINGE | INTRAVENOUS | Status: DC | PRN
  Administered 2018-08-20: 200 ug via INTRAVENOUS
  Administered 2018-08-20 (×2): 120 ug via INTRAVENOUS

## 2018-08-20 MED ORDER — ONDANSETRON HCL 4 MG/2ML IJ SOLN
INTRAMUSCULAR | Status: AC
  Filled 2018-08-20: qty 2

## 2018-08-20 MED ORDER — 0.9 % SODIUM CHLORIDE (POUR BTL) OPTIME
TOPICAL | Status: DC | PRN
  Administered 2018-08-20: 1000 mL

## 2018-08-20 SURGICAL SUPPLY — 39 items
BLADE CLIPPER SURG (BLADE) IMPLANT
BLADE SURG 15 STRL LF DISP TIS (BLADE) ×1 IMPLANT
BLADE SURG 15 STRL SS (BLADE) ×2
CARTRIDGE OIL MAESTRO DRILL (MISCELLANEOUS) ×1 IMPLANT
CEMENT BONE KYPHX HV R (Orthopedic Implant) ×3 IMPLANT
CEMENT KYPHON C01A KIT/MIXER (Cement) ×3 IMPLANT
COVER WAND RF STERILE (DRAPES) ×3 IMPLANT
DERMABOND ADVANCED (GAUZE/BANDAGES/DRESSINGS) ×2
DERMABOND ADVANCED .7 DNX12 (GAUZE/BANDAGES/DRESSINGS) ×1 IMPLANT
DIFFUSER DRILL AIR PNEUMATIC (MISCELLANEOUS) ×3 IMPLANT
DRAPE C-ARM 42X72 X-RAY (DRAPES) ×3 IMPLANT
DRAPE HALF SHEET 40X57 (DRAPES) ×3 IMPLANT
DRAPE LAPAROTOMY 100X72X124 (DRAPES) ×3 IMPLANT
DRAPE SURG 17X23 STRL (DRAPES) ×3 IMPLANT
DRAPE WARM FLUID 44X44 (DRAPE) ×3 IMPLANT
DURAPREP 26ML APPLICATOR (WOUND CARE) ×3 IMPLANT
GAUZE 4X4 16PLY RFD (DISPOSABLE) ×3 IMPLANT
GLOVE ECLIPSE 6.5 STRL STRAW (GLOVE) ×3 IMPLANT
GLOVE EXAM NITRILE XL STR (GLOVE) IMPLANT
GLOVE SURG SS PI 6.5 STRL IVOR (GLOVE) ×6 IMPLANT
GOWN STRL REUS W/ TWL LRG LVL3 (GOWN DISPOSABLE) ×2 IMPLANT
GOWN STRL REUS W/ TWL XL LVL3 (GOWN DISPOSABLE) IMPLANT
GOWN STRL REUS W/TWL 2XL LVL3 (GOWN DISPOSABLE) IMPLANT
GOWN STRL REUS W/TWL LRG LVL3 (GOWN DISPOSABLE) ×4
GOWN STRL REUS W/TWL XL LVL3 (GOWN DISPOSABLE)
KIT BASIN OR (CUSTOM PROCEDURE TRAY) ×3 IMPLANT
KIT TURNOVER KIT B (KITS) ×3 IMPLANT
NEEDLE HYPO 25X1 1.5 SAFETY (NEEDLE) ×3 IMPLANT
NS IRRIG 1000ML POUR BTL (IV SOLUTION) ×3 IMPLANT
OIL CARTRIDGE MAESTRO DRILL (MISCELLANEOUS) ×3
PACK SURGICAL SETUP 50X90 (CUSTOM PROCEDURE TRAY) ×3 IMPLANT
PAD ARMBOARD 7.5X6 YLW CONV (MISCELLANEOUS) ×9 IMPLANT
SPECIMEN JAR SMALL (MISCELLANEOUS) IMPLANT
STAPLER SKIN PROX WIDE 3.9 (STAPLE) ×3 IMPLANT
SUT VIC AB 3-0 SH 8-18 (SUTURE) ×3 IMPLANT
SYR CONTROL 10ML LL (SYRINGE) ×3 IMPLANT
TOWEL GREEN STERILE (TOWEL DISPOSABLE) ×3 IMPLANT
TOWEL GREEN STERILE FF (TOWEL DISPOSABLE) ×3 IMPLANT
TRAY KYPHOPAK 20/3 ONESTEP 1ST (MISCELLANEOUS) ×3 IMPLANT

## 2018-08-20 NOTE — Op Note (Signed)
08/20/2018  10:41 AM  PATIENT:  [name]  84 y.o. female With an acute L3 compression fracture. Her pain has not improved with conservative treatment. I recommended and she has agreed to operative treatment. PRE-OPERATIVE DIAGNOSIS:  Lumbar compression fracture L3 POST-OPERATIVE DIAGNOSIS:  Lumbar compression fracture L3  PROCEDURE:  Procedure(s): Lumbar three KYPHOPLASTY  SURGEON:  Surgeon(s): Ashok Pall, MD  ANESTHESIA:   local and general  EBL:  Total I/O In: 1120 [P.O.:120; I.V.:1000] Out: 5 [Blood:5]  BLOOD ADMINISTERED:none  COUNT:per nursing  SPECIMEN:  No Specimen  DICTATION: [name] was taken to the operating room, intubated and placed under a general anesthetic without difficulty. She was positioned prone on the operating room table with all pressure points properly padded. Her back was prepped and draped in a sterile manner. With fluoroscopy I localized the L3 pedicles bilaterally. I injected lidocaine into the entry sites on both the left and right sides. I started by making a stab incision on the right, then the left side and entered the L3 pedicles with fluoroscopic guidance. Once good position was obtained, I drilled into the vertebral body. I then placed the kyphoplasty balloons into the L3 vertebra and inflated the balloon. I then inserted 9cc of methylmethacrylate into the vertebral body under fluoroscopic guidance. I achieved a a very good fill of the cavity, staying within the confines of the vertebral body. I removed the instrumentation from the vertebral body, and the final films looked good. I closed the stab incision with vicryl suture and used Dermabond for a sterile dressing. [name]    PLAN OF CARE: Admit to inpatient   PATIENT DISPOSITION:  PACU - hemodynamically stable.   Delay start of Pharmacological VTE agent (>24hrs) due to surgical blood loss or risk of bleeding:  yes

## 2018-08-20 NOTE — Anesthesia Procedure Notes (Signed)
Procedure Name: Intubation Date/Time: 08/20/2018 9:07 AM Performed by: Lance Coon, CRNA Pre-anesthesia Checklist: Patient identified, Emergency Drugs available, Suction available, Patient being monitored and Timeout performed Patient Re-evaluated:Patient Re-evaluated prior to induction Oxygen Delivery Method: Circle system utilized Preoxygenation: Pre-oxygenation with 100% oxygen Induction Type: IV induction Ventilation: Mask ventilation without difficulty Laryngoscope Size: Miller and 3 Grade View: Grade I Tube type: Oral Tube size: 7.0 mm Number of attempts: 1 Airway Equipment and Method: Stylet Placement Confirmation: ETT inserted through vocal cords under direct vision,  positive ETCO2 and breath sounds checked- equal and bilateral Secured at: 21 cm Tube secured with: Tape Dental Injury: Teeth and Oropharynx as per pre-operative assessment

## 2018-08-20 NOTE — Progress Notes (Signed)
PHARMACIST - PHYSICIAN ORDER COMMUNICATION  CONCERNING: P&T Medication Policy on Herbal Medications  DESCRIPTION:  This patient's order for:  Papaya and Enzyme chews  has been noted.  This product(s) is classified as an "herbal" or natural product. Due to a lack of definitive safety studies or FDA approval, nonstandard manufacturing practices, plus the potential risk of unknown drug-drug interactions while on inpatient medications, the Pharmacy and Therapeutics Committee does not permit the use of "herbal" or natural products of this type within [facility].   ACTION TAKEN: The pharmacy department is unable to verify this order at this time and your patient has been informed of this safety policy. Please reevaluate patient's clinical condition at discharge and address if the herbal or natural product(s) should be resumed at that time.  Alycia Rossetti, PharmD, BCPS Pager: [phone] 11:42 AM

## 2018-08-20 NOTE — Anesthesia Postprocedure Evaluation (Signed)
Anesthesia Post Note  Patient: [name]  Procedure(s) Performed: Lumbar three KYPHOPLASTY (N/A )     Patient location during evaluation: PACU Anesthesia Type: General Level of consciousness: awake Pain management: pain level controlled Vital Signs Assessment: post-procedure vital signs reviewed and stable Respiratory status: spontaneous breathing Cardiovascular status: stable Postop Assessment: no apparent nausea or vomiting Anesthetic complications: no    Last Vitals:  Vitals:   08/20/18 1100 08/20/18 1122  BP:  (!) 117/58  Pulse: 71 76  Resp: (!) 21 16  Temp: (!) 36.4 C   SpO2: 92% 92%    Last Pain:  Vitals:   08/20/18 1130  TempSrc:   PainSc: 3                  Didi Ganaway

## 2018-08-20 NOTE — H&P (Signed)
BP 126/68   Pulse 86   Temp 98 F (36.7 C) (Oral)   Resp 18   Ht 5\' 5"  (1.651 m)   Wt 66.2 kg   SpO2 97%   BMI 24.30 kg/m  HISTORY OF PRESENT ILLNESS: Today, I saw [name] in the office.  She does have a T8 compression fracture, which did progress, unfortunately, from the initial x-ray performed August 1 to the x-ray that I did today in the office.  The MRI had already shown that progression, but I just wanted a baseline on x-ray.  Her pain, for the most part, has resolved, though she still is in some discomfort, but she is taking only Tylenol at this time.  So, honestly, the only thing we are trying to combat now is a progressive kyphosis.  I will brace her, as I do not have any good reason why she would not continue with kyphosis.  I will see her in the office afterwards.  Her exam was extraordinarily good and she certainly looks wonderful for an 84 year old.  So, hopefully the kyphosis will stop and we can simply leave it at that.      [name] is an 84 year old woman who comes in today for evaluation of T8 compression fracture.  Much of her pain is resolved.  She is only taking plain Tylenol now.    She says that the injury occurred when she was more than likely taking out her garbage and pulling on the can when she felt something.  The pain did not improve initially.  She went in and got an x-ray which was read as being normal.  She then had an MRI when the pain did not resolve on September 10.  That revealed a compression fracture of T8, which was acute.   SOCIAL HISTORY: She is retired.  She is not a smoker.  She is right handed.  She does not use alcohol.  No history of substance abuse.   PAST MEDICAL HISTORY: She has had a pulmonary embolus and acute bronchitis at times.   PAST SURGICAL HISTORY: She has undergone a partial hysterectomy, cholecystectomy.   MEDICATIONS: She takes Rhinocort, Coumadin, Ventolin, Zantac, cyclobenzaprine, a muscle spasm medication, and  tramadol.   FAMILY HISTORY: Mother died age 51.  Father is deceased, heart disease and blood clot as the reasons.   REVIEW OF SYSTEMS: Positive for hypercholesterolemia, tinnitus, chronic cough, shortness of breath, leg weakness, back pain, inhalant allergies.   PHYSICAL EXAMINATION: Vital signs:  Height 5 feet 3 inches, weight 137 pounds, blood pressure 176/74, pulse is 89, respiratory rate 16.  She has no pain.  General:  She is alert, oriented x4.  She answers all questions appropriately.  Psychiatric:  Memory, language, attention span, and fund of knowledge are normal.  Speech is clear.  It is also fluent.  Neurologic:  Hearing intact to voice.  5/5 strength in the upper and lower extremities.  2+ reflexes, biceps, triceps, brachioradialis, knees, and ankles.  Tongue and uvula midline.  Shoulder shrug is normal.  Hearing intact to voice.  [name] comes in today after taking a shower, bending over and feeling something go in the lower back.  She went to the emergency department last week and had a CT scan which seems to imply a new compression fracture of L3, possibly of L4.  They did not take another look at T8.      On exam, she has normal 5/5 strength.  She is just in  a great deal of pain.  Height 5 feet 5 inches, weight 143 pounds, blood pressure 178/72, pulse is 90, temperature is 98.5.  She says the pain is 10/10 when she stands, 3/10 when she is sitting down.

## 2018-08-20 NOTE — Progress Notes (Signed)
Assessment @0805 

## 2018-08-20 NOTE — Progress Notes (Signed)
Patient's O2 93% with assessing VS.  Patient placed on 2L O2 (home dose 2L PRN) and O2 came up to 97%.  Will continue to monitor patient.

## 2018-08-20 NOTE — Transfer of Care (Signed)
Immediate Anesthesia Transfer of Care Note  Patient: [name]  Procedure(s) Performed: Lumbar three KYPHOPLASTY (N/A )  Patient Location: PACU  Anesthesia Type:General  Level of Consciousness: awake and patient cooperative  Airway & Oxygen Therapy: Patient Spontanous Breathing and Patient connected to face mask oxygen  Post-op Assessment: Report given to RN and Post -op Vital signs reviewed and stable  Post vital signs: Reviewed and stable  Last Vitals:  Vitals Value Taken Time  BP 141/48 08/20/2018 10:06 AM  Temp    Pulse 76 08/20/2018 10:07 AM  Resp 19 08/20/2018 10:07 AM  SpO2 100 % 08/20/2018 10:07 AM  Vitals shown include unvalidated device data.  Last Pain:  Vitals:   08/20/18 0703  TempSrc:   PainSc: 3          Complications: No apparent anesthesia complications

## 2018-08-21 ENCOUNTER — Encounter (HOSPITAL_COMMUNITY): Payer: Self-pay | Admitting: Neurosurgery

## 2018-08-21 DIAGNOSIS — Z90710 Acquired absence of both cervix and uterus: Secondary | ICD-10-CM | POA: Diagnosis not present

## 2018-08-21 DIAGNOSIS — Z7901 Long term (current) use of anticoagulants: Secondary | ICD-10-CM | POA: Diagnosis not present

## 2018-08-21 DIAGNOSIS — Z79899 Other long term (current) drug therapy: Secondary | ICD-10-CM | POA: Diagnosis not present

## 2018-08-21 DIAGNOSIS — Z86711 Personal history of pulmonary embolism: Secondary | ICD-10-CM | POA: Diagnosis not present

## 2018-08-21 DIAGNOSIS — S32030A Wedge compression fracture of third lumbar vertebra, initial encounter for closed fracture: Secondary | ICD-10-CM | POA: Diagnosis not present

## 2018-08-21 DIAGNOSIS — Z9049 Acquired absence of other specified parts of digestive tract: Secondary | ICD-10-CM | POA: Diagnosis not present

## 2018-08-21 MED ORDER — HYDROCODONE-ACETAMINOPHEN 5-325 MG PO TABS: 1.0000 | ORAL_TABLET | Freq: Four times a day (QID) | ORAL | 0 refills | Status: AC | PRN

## 2018-08-21 NOTE — Discharge Summary (Signed)
Physician Discharge Summary  Patient ID: [name] MRN: [id number] DOB/AGE: [birth year] 84 y.o.  Admit date: 08/20/2018 Discharge date: 08/21/2018  Admission Diagnoses:L3 compression fracture  Discharge Diagnoses: same Active Problems:   Compression fracture of L3 vertebra [facility])   Discharged Condition: good  Hospital Course: [name] was admitted and taken to the operating room for an uncomplicated lumbar kyphoplasty. Post op she is ambulating, voiding, and tolerating a regular diet. She will be discharged home  Treatments: surgery: L3 kyphoplasty  Discharge Exam: Blood pressure (!) 126/55, pulse 82, temperature (!) 97.5 F (36.4 C), temperature source Oral, resp. rate 18, height 5\' 5"  (1.651 m), weight 66.2 kg, SpO2 93 %. General appearance: alert, cooperative, appears stated age and mild distress Neurologic: Alert and oriented X 3, normal strength and tone. Normal symmetric reflexes. Normal coordination and gait  Disposition:  Lumbar compression fracture  Allergies as of 08/21/2018      Reactions   Raloxifene Other (See Comments)   REACTION: risk of recurrent stroke   Sulfonamide Derivatives Anaphylaxis   REACTION: tongue swells   Amoxicillin-pot Clavulanate Diarrhea   REACTION: diarrhea Has patient had a PCN reaction causing immediate rash, facial/tongue/throat swelling, SOB or lightheadedness with hypotension: unknown Has patient had a PCN reaction causing severe rash involving mucus membranes or skin necrosis: unknown Has patient had a PCN reaction that required hospitalization : unknown Has patient had a PCN reaction occurring within the last 10 years: yes  pt states she should be able to take penicillin, but cant remember actually taking it    Ciclesonide Other (See Comments)   REACTION: bad smell   Ciprofloxacin Other (See Comments)   dizziness      Medication List    TAKE these medications   albuterol 0.63 MG/3ML nebulizer solution Commonly known  as:  ACCUNEB Take 3 mLs (0.63 mg total) by nebulization every 6 (six) hours as needed for wheezing.   VENTOLIN HFA 108 (90 Base) MCG/ACT inhaler Generic drug:  albuterol INHALE 2 PUFFS INTO THE LUNGS EVERY 6 HOURS AS NEEDED FOR WHEEZING OR SHORTNESS OF BREATH.   amLODipine 5 MG tablet Commonly known as:  NORVASC TAKE 1 TABLET EVERY DAY   cyclobenzaprine 10 MG tablet Commonly known as:  FLEXERIL Take 1 tablet (10 mg total) by mouth 3 (three) times daily as needed for muscle spasms.   HYDROcodone-acetaminophen 5-325 MG tablet Commonly known as:  NORCO/VICODIN Take 1 tablet by mouth every 6 (six) hours as needed for severe pain. What changed:  Another medication with the same name was added. Make sure you understand how and when to take each.   HYDROcodone-acetaminophen 5-325 MG tablet Commonly known as:  NORCO/VICODIN Take 1 tablet by mouth every 6 (six) hours as needed for moderate pain. What changed:  You were already taking a medication with the same name, and this prescription was added. Make sure you understand how and when to take each.   loratadine 10 MG tablet Commonly known as:  CLARITIN Take 10 mg by mouth daily.   losartan 100 MG tablet Commonly known as:  COZAAR TAKE 1 TABLET EVERY DAY   MACUVITE Tabs Take 1 tablet by mouth daily.   meclizine 12.5 MG tablet Commonly known as:  ANTIVERT Take 1 tablet (12.5 mg total) by mouth 3 (three) times daily as needed for dizziness.   PAPAYA AND ENZYMES Chew Chew 2 tablets by mouth 3 (three) times daily with meals as needed (digestion of big meals).   PROBIOTIC DAILY PO  Take 1 tablet by mouth daily.   promethazine 25 MG tablet Commonly known as:  PHENERGAN Take 1 tablet (25 mg total) by mouth every 6 (six) hours as needed for nausea or vomiting.   RHINOCORT ALLERGY 32 MCG/ACT nasal spray Generic drug:  budesonide Place 1 spray into both nostrils daily as needed for rhinitis or allergies.   sertraline 50 MG  tablet Commonly known as:  ZOLOFT Take 1 tablet (50 mg total) by mouth daily.   warfarin 5 MG tablet Commonly known as:  COUMADIN Take as directed. If you are unsure how to take this medication, talk to your nurse or doctor. Original instructions:  TAKE AS DIRECTED BY ANTICOAGULATION CLINIC What changed:    how much to take  how to take this  when to take this  additional instructions      Follow-up Information    Ashok Pall, MD Follow up in 3 week(s).   Specialty:  Neurosurgery Why:  please call to make an appoinment Contact information: [address] [place] [address] [phone]           Signed: Winfield Cunas 08/21/2018, 5:21 PM

## 2018-08-21 NOTE — Discharge Instructions (Signed)

## 2018-08-26 ENCOUNTER — Inpatient Hospital Stay (HOSPITAL_COMMUNITY)
Admission: EM | Admit: 2018-08-26 | Discharge: 2018-08-28 | DRG: 194 | Disposition: A | Discharge status: Home Health | Payer: Medicare Other | Attending: Family Medicine | Admitting: Family Medicine

## 2018-08-26 ENCOUNTER — Emergency Department (HOSPITAL_COMMUNITY): Payer: Medicare Other

## 2018-08-26 ENCOUNTER — Other Ambulatory Visit: Payer: Self-pay

## 2018-08-26 ENCOUNTER — Encounter (HOSPITAL_COMMUNITY): Payer: Self-pay

## 2018-08-26 DIAGNOSIS — M4854XA Collapsed vertebra, not elsewhere classified, thoracic region, initial encounter for fracture: Secondary | ICD-10-CM | POA: Diagnosis not present

## 2018-08-26 DIAGNOSIS — Z86711 Personal history of pulmonary embolism: Secondary | ICD-10-CM

## 2018-08-26 DIAGNOSIS — I739 Peripheral vascular disease, unspecified: Secondary | ICD-10-CM | POA: Diagnosis present

## 2018-08-26 DIAGNOSIS — T380X5A Adverse effect of glucocorticoids and synthetic analogues, initial encounter: Secondary | ICD-10-CM | POA: Diagnosis present

## 2018-08-26 DIAGNOSIS — Z9049 Acquired absence of other specified parts of digestive tract: Secondary | ICD-10-CM

## 2018-08-26 DIAGNOSIS — K5904 Chronic idiopathic constipation: Secondary | ICD-10-CM | POA: Diagnosis present

## 2018-08-26 DIAGNOSIS — J449 Chronic obstructive pulmonary disease, unspecified: Secondary | ICD-10-CM | POA: Diagnosis not present

## 2018-08-26 DIAGNOSIS — Z807 Family history of other malignant neoplasms of lymphoid, hematopoietic and related tissues: Secondary | ICD-10-CM

## 2018-08-26 DIAGNOSIS — Z7901 Long term (current) use of anticoagulants: Secondary | ICD-10-CM

## 2018-08-26 DIAGNOSIS — Z90710 Acquired absence of both cervix and uterus: Secondary | ICD-10-CM

## 2018-08-26 DIAGNOSIS — R0902 Hypoxemia: Secondary | ICD-10-CM | POA: Diagnosis not present

## 2018-08-26 DIAGNOSIS — E871 Hypo-osmolality and hyponatremia: Secondary | ICD-10-CM | POA: Diagnosis not present

## 2018-08-26 DIAGNOSIS — I10 Essential (primary) hypertension: Secondary | ICD-10-CM | POA: Diagnosis present

## 2018-08-26 DIAGNOSIS — X58XXXA Exposure to other specified factors, initial encounter: Secondary | ICD-10-CM | POA: Diagnosis present

## 2018-08-26 DIAGNOSIS — Z79891 Long term (current) use of opiate analgesic: Secondary | ICD-10-CM

## 2018-08-26 DIAGNOSIS — K219 Gastro-esophageal reflux disease without esophagitis: Secondary | ICD-10-CM | POA: Diagnosis present

## 2018-08-26 DIAGNOSIS — Z882 Allergy status to sulfonamides status: Secondary | ICD-10-CM

## 2018-08-26 DIAGNOSIS — J441 Chronic obstructive pulmonary disease with (acute) exacerbation: Secondary | ICD-10-CM

## 2018-08-26 DIAGNOSIS — I272 Pulmonary hypertension, unspecified: Secondary | ICD-10-CM | POA: Diagnosis present

## 2018-08-26 DIAGNOSIS — Z880 Allergy status to penicillin: Secondary | ICD-10-CM

## 2018-08-26 DIAGNOSIS — N3281 Overactive bladder: Secondary | ICD-10-CM | POA: Diagnosis present

## 2018-08-26 DIAGNOSIS — J189 Pneumonia, unspecified organism: Secondary | ICD-10-CM | POA: Diagnosis not present

## 2018-08-26 DIAGNOSIS — D72829 Elevated white blood cell count, unspecified: Secondary | ICD-10-CM | POA: Diagnosis present

## 2018-08-26 DIAGNOSIS — I69354 Hemiplegia and hemiparesis following cerebral infarction affecting left non-dominant side: Secondary | ICD-10-CM

## 2018-08-26 DIAGNOSIS — Z8781 Personal history of (healed) traumatic fracture: Secondary | ICD-10-CM

## 2018-08-26 DIAGNOSIS — Z79899 Other long term (current) drug therapy: Secondary | ICD-10-CM

## 2018-08-26 DIAGNOSIS — Z888 Allergy status to other drugs, medicaments and biological substances status: Secondary | ICD-10-CM

## 2018-08-26 DIAGNOSIS — I2699 Other pulmonary embolism without acute cor pulmonale: Secondary | ICD-10-CM | POA: Diagnosis not present

## 2018-08-26 DIAGNOSIS — M4856XA Collapsed vertebra, not elsewhere classified, lumbar region, initial encounter for fracture: Secondary | ICD-10-CM | POA: Diagnosis not present

## 2018-08-26 DIAGNOSIS — J470 Bronchiectasis with acute lower respiratory infection: Secondary | ICD-10-CM | POA: Diagnosis not present

## 2018-08-26 DIAGNOSIS — R0789 Other chest pain: Secondary | ICD-10-CM | POA: Diagnosis not present

## 2018-08-26 DIAGNOSIS — J181 Lobar pneumonia, unspecified organism: Secondary | ICD-10-CM

## 2018-08-26 DIAGNOSIS — H9190 Unspecified hearing loss, unspecified ear: Secondary | ICD-10-CM | POA: Diagnosis present

## 2018-08-26 DIAGNOSIS — J471 Bronchiectasis with (acute) exacerbation: Secondary | ICD-10-CM | POA: Diagnosis not present

## 2018-08-26 DIAGNOSIS — Z825 Family history of asthma and other chronic lower respiratory diseases: Secondary | ICD-10-CM

## 2018-08-26 DIAGNOSIS — Z881 Allergy status to other antibiotic agents status: Secondary | ICD-10-CM

## 2018-08-26 DIAGNOSIS — Z8249 Family history of ischemic heart disease and other diseases of the circulatory system: Secondary | ICD-10-CM

## 2018-08-26 DIAGNOSIS — Z823 Family history of stroke: Secondary | ICD-10-CM

## 2018-08-26 DIAGNOSIS — Z803 Family history of malignant neoplasm of breast: Secondary | ICD-10-CM

## 2018-08-26 DIAGNOSIS — I2510 Atherosclerotic heart disease of native coronary artery without angina pectoris: Secondary | ICD-10-CM | POA: Diagnosis present

## 2018-08-26 DIAGNOSIS — G9619 Other disorders of meninges, not elsewhere classified: Secondary | ICD-10-CM | POA: Diagnosis present

## 2018-08-26 DIAGNOSIS — E785 Hyperlipidemia, unspecified: Secondary | ICD-10-CM | POA: Diagnosis present

## 2018-08-26 DIAGNOSIS — E861 Hypovolemia: Secondary | ICD-10-CM | POA: Diagnosis present

## 2018-08-26 DIAGNOSIS — I6521 Occlusion and stenosis of right carotid artery: Secondary | ICD-10-CM | POA: Diagnosis present

## 2018-08-26 DIAGNOSIS — M810 Age-related osteoporosis without current pathological fracture: Secondary | ICD-10-CM | POA: Diagnosis present

## 2018-08-26 LAB — CBC
HCT: 43.2 % (ref 36.0–46.0)
Hemoglobin: 14.3 g/dL (ref 12.0–15.0)
MCH: 31.7 pg (ref 26.0–34.0)
MCHC: 33.1 g/dL (ref 30.0–36.0)
MCV: 95.8 fL (ref 80.0–100.0)
Platelets: 306 10*3/uL (ref 150–400)
RBC: 4.51 MIL/uL (ref 3.87–5.11)
RDW: 13.3 % (ref 11.5–15.5)
WBC: 11.2 10*3/uL — ABNORMAL HIGH (ref 4.0–10.5)
nRBC: 0 % (ref 0.0–0.2)

## 2018-08-26 LAB — BASIC METABOLIC PANEL
Anion gap: 12 (ref 5–15)
BUN: 11 mg/dL (ref 8–23)
CO2: 27 mmol/L (ref 22–32)
Calcium: 9.4 mg/dL (ref 8.9–10.3)
Chloride: 88 mmol/L — ABNORMAL LOW (ref 98–111)
Creatinine, Ser: 0.52 mg/dL (ref 0.44–1.00)
GFR calc Af Amer: >60 mL/min (ref >60)
GFR calc non Af Amer: >60 mL/min (ref >60)
Glucose, Bld: 135 mg/dL — ABNORMAL HIGH (ref 70–99)
Potassium: 3.9 mmol/L (ref 3.5–5.1)
Sodium: 127 mmol/L — ABNORMAL LOW (ref 135–145)

## 2018-08-26 LAB — CG4 I-STAT (LACTIC ACID): LACTIC ACID, VENOUS: 2.49 mmol/L — AB (ref 0.5–1.9)

## 2018-08-26 LAB — POCT I-STAT TROPONIN I: TROPONIN I, POC: 0 ng/mL (ref 0.00–0.08)

## 2018-08-26 LAB — PROTIME-INR
INR: 2.53
Prothrombin Time: 26.9 seconds — ABNORMAL HIGH (ref 11.4–15.2)

## 2018-08-26 MED ORDER — AMLODIPINE BESYLATE 5 MG PO TABS
5.0000 mg | ORAL_TABLET | Freq: Every day | ORAL | Status: DC
  Administered 2018-08-26 – 2018-08-28 (×3): 5 mg via ORAL
  Filled 2018-08-26 (×3): qty 1

## 2018-08-26 MED ORDER — IOPAMIDOL (ISOVUE-370) INJECTION 76%
100.0000 mL | Freq: Once | INTRAVENOUS | Status: AC | PRN
  Administered 2018-08-26: 100 mL via INTRAVENOUS

## 2018-08-26 MED ORDER — WARFARIN SODIUM 2.5 MG PO TABS
2.5000 mg | ORAL_TABLET | Freq: Once | ORAL | Status: AC
  Administered 2018-08-26: 2.5 mg via ORAL
  Filled 2018-08-26: qty 1

## 2018-08-26 MED ORDER — ALBUTEROL SULFATE (2.5 MG/3ML) 0.083% IN NEBU
2.5000 mg | INHALATION_SOLUTION | RESPIRATORY_TRACT | Status: DC | PRN
  Administered 2018-08-27 – 2018-08-28 (×2): 2.5 mg via RESPIRATORY_TRACT
  Filled 2018-08-26 (×2): qty 3

## 2018-08-26 MED ORDER — SODIUM CHLORIDE 0.9 % IV SOLN
500.0000 mg | Freq: Once | INTRAVENOUS | Status: AC
  Administered 2018-08-26: 500 mg via INTRAVENOUS
  Filled 2018-08-26: qty 500

## 2018-08-26 MED ORDER — ONDANSETRON HCL 4 MG PO TABS: 4.0000 mg | ORAL_TABLET | Freq: Four times a day (QID) | ORAL | Status: DC | PRN

## 2018-08-26 MED ORDER — ALBUTEROL SULFATE (2.5 MG/3ML) 0.083% IN NEBU
5.0000 mg | INHALATION_SOLUTION | Freq: Once | RESPIRATORY_TRACT | Status: AC
  Administered 2018-08-26: 5 mg via RESPIRATORY_TRACT
  Filled 2018-08-26: qty 6

## 2018-08-26 MED ORDER — SODIUM CHLORIDE 0.9 % IV BOLUS
500.0000 mL | Freq: Once | INTRAVENOUS | Status: AC
  Administered 2018-08-26: 500 mL via INTRAVENOUS

## 2018-08-26 MED ORDER — IOPAMIDOL (ISOVUE-370) INJECTION 76%
INTRAVENOUS | Status: AC
  Filled 2018-08-26: qty 100

## 2018-08-26 MED ORDER — ACETAMINOPHEN 325 MG PO TABS
650.0000 mg | ORAL_TABLET | Freq: Four times a day (QID) | ORAL | Status: DC | PRN
  Administered 2018-08-27 – 2018-08-28 (×3): 650 mg via ORAL
  Filled 2018-08-26 (×3): qty 2

## 2018-08-26 MED ORDER — IPRATROPIUM BROMIDE 0.02 % IN SOLN
0.5000 mg | Freq: Once | RESPIRATORY_TRACT | Status: AC
  Administered 2018-08-26: 0.5 mg via RESPIRATORY_TRACT
  Filled 2018-08-26: qty 2.5

## 2018-08-26 MED ORDER — WARFARIN - PHARMACIST DOSING INPATIENT: Freq: Every day | Status: DC

## 2018-08-26 MED ORDER — SODIUM CHLORIDE 0.9 % IV SOLN
500.0000 mg | INTRAVENOUS | Status: DC
  Administered 2018-08-27: 500 mg via INTRAVENOUS
  Filled 2018-08-26 (×2): qty 500

## 2018-08-26 MED ORDER — SODIUM CHLORIDE 0.9 % IV SOLN
1.0000 g | INTRAVENOUS | Status: DC
  Administered 2018-08-27: 1 g via INTRAVENOUS
  Filled 2018-08-26: qty 1
  Filled 2018-08-26: qty 10

## 2018-08-26 MED ORDER — ACETAMINOPHEN 650 MG RE SUPP: 650.0000 mg | Freq: Four times a day (QID) | RECTAL | Status: DC | PRN

## 2018-08-26 MED ORDER — ONDANSETRON HCL 4 MG/2ML IJ SOLN: 4.0000 mg | Freq: Four times a day (QID) | INTRAMUSCULAR | Status: DC | PRN

## 2018-08-26 MED ORDER — SODIUM CHLORIDE 0.9 % IV SOLN
INTRAVENOUS | Status: AC
  Administered 2018-08-27: via INTRAVENOUS

## 2018-08-26 MED ORDER — CYCLOBENZAPRINE HCL 10 MG PO TABS
10.0000 mg | ORAL_TABLET | Freq: Three times a day (TID) | ORAL | Status: DC | PRN
  Administered 2018-08-27 – 2018-08-28 (×2): 10 mg via ORAL
  Filled 2018-08-26 (×2): qty 1

## 2018-08-26 MED ORDER — SODIUM CHLORIDE (PF) 0.9 % IJ SOLN
INTRAMUSCULAR | Status: AC
  Filled 2018-08-26: qty 50

## 2018-08-26 MED ORDER — SENNOSIDES-DOCUSATE SODIUM 8.6-50 MG PO TABS: 1.0000 | ORAL_TABLET | Freq: Every evening | ORAL | Status: DC | PRN

## 2018-08-26 MED ORDER — METHYLPREDNISOLONE SODIUM SUCC 125 MG IJ SOLR
60.0000 mg | Freq: Four times a day (QID) | INTRAMUSCULAR | Status: DC
  Administered 2018-08-27 (×3): 60 mg via INTRAVENOUS
  Filled 2018-08-26 (×3): qty 2

## 2018-08-26 MED ORDER — METHYLPREDNISOLONE SODIUM SUCC 125 MG IJ SOLR
60.0000 mg | Freq: Once | INTRAMUSCULAR | Status: AC
  Administered 2018-08-26: 60 mg via INTRAVENOUS
  Filled 2018-08-26: qty 2

## 2018-08-26 MED ORDER — HYDROCODONE-ACETAMINOPHEN 5-325 MG PO TABS
1.0000 | ORAL_TABLET | ORAL | Status: DC | PRN
  Administered 2018-08-27: 1 via ORAL
  Filled 2018-08-26: qty 1

## 2018-08-26 MED ORDER — MORPHINE SULFATE (PF) 2 MG/ML IV SOLN: 2.0000 mg | INTRAVENOUS | Status: DC | PRN

## 2018-08-26 MED ORDER — SODIUM CHLORIDE 0.9 % IV SOLN
1.0000 g | Freq: Once | INTRAVENOUS | Status: AC
  Administered 2018-08-26: 1 g via INTRAVENOUS
  Filled 2018-08-26: qty 10

## 2018-08-26 MED ORDER — LOSARTAN POTASSIUM 50 MG PO TABS
100.0000 mg | ORAL_TABLET | Freq: Every day | ORAL | Status: DC
  Administered 2018-08-27 – 2018-08-28 (×2): 100 mg via ORAL
  Filled 2018-08-26 (×4): qty 2

## 2018-08-26 NOTE — ED Provider Notes (Signed)
[facility] DEPT Provider Note   CSN: [id number] Arrival date & time: 08/26/18  1620     History   Chief Complaint Chief Complaint  Patient presents with  . Shortness of Breath  . Cough  . Chest Pain    HPI [name] is a 84 y.o. female.  HPI   Patient is an 84 year old female with a history of pulmonary embolism, chronic Coumadin anticoagulation, COPD, hyperlipidemia, hypertension, recent kyphoplasty presenting for chest pain and shortness of breath.  Patient reports that her symptoms began to worsen overnight last night.  She reports pleuritic pain that was causing her to breathe shallow.  She reports she is also had increased sputum production over the past 5 days.  She describes the sputum is purulent, but denies hemoptysis.  She denies any rhinorrhea, sore throat, or congestion.  Patient reports that she has been nauseous with a low appetite over the last couple days.  She reports that her symptoms get worse with exertion, because she becomes very short of breath.  Patient reports otherwise she has had no complications from her kyphoplasty on 09-06-2018.  Patient denies any fever or chills.  Patient reports nausea without vomiting.  Patient reports that she stopped her Coumadin for 5 days prior to her kyphoplasty, and has some confusion about when to restart it, so had a recent period of 7 days without anticoagulation.  Patient denies any lower extremity edema or calf tenderness.  Past Medical History:  Diagnosis Date  . ALLERGIC RHINITIS   . ANXIETY   . Asthma   . Bronchiectasis 12/11/2011   CT chest dx  . CAROTID ARTERY STENOSIS, RIGHT   . Chronic idiopathic constipation   . Chronic rhinitis   . COMMON MIGRAINE   . COPD   . CORONARY ARTERY DISEASE   . CYST/PSEUDOCYST, PANCREAS   . DEPRESSION   . DIVERTICULOSIS, COLON   . DIZZINESS, CHRONIC   . Dyspnea    on exertion  . Esophageal stricture   . GERD   . HYPERLIPIDEMIA   .  HYPERTENSION   . Internal hemorrhoids with complication   . OSTEOPOROSIS   . OVERACTIVE BLADDER   . Pulmonary embolism ([place]) 2008  . PULMONARY HYPERTENSION, SECONDARY   . Stroke ([place])   . Tubular adenoma of colon 10/2013  . Unspecified hearing loss     Patient Active Problem List   Diagnosis Date Noted  . Compression fracture of L3 vertebra (HCC) 08/20/2018  . Acute bilateral thoracic back pain 05/26/2018  . Cough 11/20/2017  . Abdominal pain 10/15/2017  . Wheezing 08/06/2017  . General weakness 08/02/2016  . Encounter for therapeutic drug monitoring 12/16/2013  . Acute upper respiratory infection 12/16/2013  . Nonspecific abnormal finding in stool contents 10/19/2013  . Benign neoplasm of colon 10/19/2013  . Urinary frequency 08/28/2013  . Hematochezia 05/26/2013  . Chest pain 11/10/2012  . Vertigo 05/28/2012  . Trapezius strain 01/10/2012  . Chronic respiratory failure ([place]) 12/21/2011  . Bronchiectasis ([place]) 12/11/2011  . Fatigue 11/29/2011  . Preventative health care 09/22/2011  . Impaired glucose tolerance 09/21/2011  . Internal hemorrhoids without complication 97/11/6376  . Palpitations 05/21/2011  . Long term (current) use of anticoagulants 01/03/2011  . Pulmonary embolism ([place]) 12/08/2010  . FLATULENCE-GAS-BLOATING 07/03/2010  . ABNORMAL FINDINGS GI TRACT 07/03/2010  . WEIGHT LOSS-ABNORMAL 06/06/2010  . NAUSEA 06/06/2010  . ABDOMINAL PAIN-PERIUMBILICAL 58/85/0277  . ABDOMINAL PAIN-EPIGASTRIC 06/06/2010  . ABNORMAL EXAM-BILIARY TRACT 06/06/2010  . DIZZINESS, CHRONIC 10/14/2009  .  OVERACTIVE BLADDER 05/26/2009  . CORONARY ARTERY DISEASE 04/06/2009  . COLONIC POLYPS, HX OF 04/06/2009  . Chronic rhinitis 11/22/2008  . Dysuria 05/27/2008  . HEMATOCHEZIA 05/07/2008  . COPD (chronic obstructive pulmonary disease) ([place]) 03/31/2008  . PULMONARY HYPERTENSION, SECONDARY 01/15/2008  . Dyspnea 12/30/2007  . GAIT IMBALANCE 11/26/2007  . Unspecified hearing loss  10/17/2007  . Hyperlipidemia 08/05/2007  . ANXIETY 08/05/2007  . Depression 08/05/2007  . COMMON MIGRAINE 08/05/2007  . CAROTID ARTERY STENOSIS, RIGHT 08/05/2007  . Allergic rhinitis 08/05/2007  . DIVERTICULOSIS, COLON 08/05/2007  . CYST/PSEUDOCYST, PANCREAS 08/05/2007  . OSTEOARTHRITIS, KNEE, RIGHT 08/05/2007  . Personal History of Other Diseases of Digestive Disease 08/05/2007  . Essential hypertension 03/11/2007  . GERD 03/11/2007  . OSTEOPOROSIS 03/11/2007  . TRANSIENT ISCHEMIC ATTACK, HX OF 03/11/2007    Past Surgical History:  Procedure Laterality Date  . ABDOMINAL HYSTERECTOMY  1965  . APPENDECTOMY  1963  . CHOLECYSTECTOMY  1963  . COLONOSCOPY N/A 10/19/2013   Procedure: COLONOSCOPY;  Surgeon: Ladene Artist, MD;  Location: [facility];  Service: Endoscopy;  Laterality: N/A;  . EGD  1999  . KYPHOPLASTY N/A 08/20/2018   Procedure: Lumbar three KYPHOPLASTY;  Surgeon: Ashok Pall, MD;  Location: [facility];  Service: Neurosurgery;  Laterality: N/A;  . PANCREATIC CYST DRAINAGE     x 2  . ROOT CANAL    . s/p sinus surgury    . TONSILLECTOMY       OB History   None      Home Medications    Prior to Admission medications   Medication Sig Start Date End Date Taking? Authorizing Provider  acetaminophen (TYLENOL) 500 MG tablet Take 1,000 mg by mouth every 6 (six) hours as needed for moderate pain.   Yes [provider]  amLODipine (NORVASC) 5 MG tablet TAKE 1 TABLET EVERY DAY 05/06/18  Yes [name], MD  budesonide (RHINOCORT ALLERGY) 32 MCG/ACT nasal spray Place 1 spray into both nostrils daily.    Yes [provider]  cyclobenzaprine (FLEXERIL) 10 MG tablet Take 1 tablet (10 mg total) by mouth 3 (three) times daily as needed for muscle spasms. 05/26/18  Yes [name], MD  Digestive Enzymes (PAPAYA AND ENZYMES) CHEW Chew 2 tablets by mouth 3 (three) times daily with meals as needed (digestion of big meals).    Yes [provider]    HYDROcodone-acetaminophen (NORCO/VICODIN) 5-325 MG tablet Take 1 tablet by mouth every 6 (six) hours as needed for moderate pain. 08/21/18  Yes Ashok Pall, MD  loratadine (CLARITIN) 10 MG tablet Take 10 mg by mouth daily as needed for allergies.    Yes [provider]  losartan (COZAAR) 100 MG tablet TAKE 1 TABLET EVERY DAY 06/10/18  Yes [name], MD  Multiple Vitamins-Minerals [facility]) TABS Take 1 tablet by mouth daily.     Yes [provider]  Probiotic Product (PROBIOTIC DAILY PO) Take 1 tablet by mouth daily.   Yes [provider]  VENTOLIN HFA 108 (90 Base) MCG/ACT inhaler INHALE 2 PUFFS INTO THE LUNGS EVERY 6 HOURS AS NEEDED FOR WHEEZING OR SHORTNESS OF BREATH. Patient taking differently: Inhale 2 puffs into the lungs every 6 (six) hours as needed.  05/08/18  Yes [name], MD  warfarin (COUMADIN) 5 MG tablet TAKE AS DIRECTED BY ANTICOAGULATION CLINIC Patient taking differently: Take 2.5-5 mg by mouth daily. 5 mg on Mon and Fri 2.5 mg on Tues, Wed, Thurs, Sat and Sun 05/06/18  Yes [name], MD  albuterol (ACCUNEB) 0.63 MG/3ML nebulizer solution Take 3 mLs (0.63 mg total) by nebulization every 6 (six) hours as needed for wheezing. 06/28/17   [name], MD  HYDROcodone-acetaminophen (NORCO/VICODIN) 5-325 MG tablet Take 1 tablet by mouth every 6 (six) hours as needed for severe pain. Patient not taking: Reported on 08/26/2018 07/30/18   Caccavale, Sophia, PA-C  meclizine (ANTIVERT) 12.5 MG tablet Take 1 tablet (12.5 mg total) by mouth 3 (three) times daily as needed for dizziness. Patient not taking: Reported on 07/30/2018 03/21/17   [name], MD  promethazine (PHENERGAN) 25 MG tablet Take 1 tablet (25 mg total) by mouth every 6 (six) hours as needed for nausea or vomiting. 11/07/17   [name], MD  sertraline (ZOLOFT) 50 MG tablet Take 1 tablet (50 mg total) by mouth daily. Patient not taking: Reported on 08/26/2018 05/26/18   [name], MD     Family History Family History  Problem Relation Age of Onset  . Heart disease Mother   . Heart attack Mother        first one age 51  . Emphysema Father        was a smoker  . Stroke Father   . Breast cancer Daughter   . Lymphoma Sister   . Colon cancer Neg Hx   . Esophageal cancer Neg Hx   . Rectal cancer Neg Hx     Social History Social History   Tobacco Use  . Smoking status: Never Smoker  . Smokeless tobacco: Never Used  . Tobacco comment: spouse smoked in the home for 15 years  Substance Use Topics  . Alcohol use: No  . Drug use: No     Allergies   Raloxifene; Sulfonamide derivatives; Amoxicillin-pot clavulanate; Ciclesonide; and Ciprofloxacin   Review of Systems Review of Systems  Constitutional: Positive for appetite change. Negative for chills and fever.  HENT: Negative for congestion, rhinorrhea, sinus pain and sore throat.   Eyes: Negative for visual disturbance.  Respiratory: Positive for cough, chest tightness and shortness of breath.   Cardiovascular: Positive for chest pain. Negative for palpitations and leg swelling.  Gastrointestinal: Positive for nausea. Negative for abdominal pain, constipation, diarrhea and vomiting.  Genitourinary: Negative for dysuria and flank pain.  Musculoskeletal: Negative for back pain and myalgias.  Skin: Negative for rash.  Neurological: Negative for dizziness, syncope, light-headedness and headaches.     Physical Exam Updated Vital Signs BP (!) 133/98   Pulse 81   Temp (!) 97.5 F (36.4 C) (Oral)   Resp (!) 21   Ht _0  (1.651 m)   Wt 66.7 kg   SpO2 99%   BMI 24.46 kg/m   Physical Exam  Constitutional: She appears well-developed and well-nourished. No distress.  HENT:  Head: Normocephalic and atraumatic.  Mouth/Throat: Oropharynx is clear and moist.  Eyes: Pupils are equal, round, and reactive to light. Conjunctivae and EOM are normal.  Neck: Normal range of motion. Neck supple.  Cardiovascular:  Normal rate, regular rhythm, S1 normal and S2 normal.  No murmur heard. No lower extremity edema.  No calf tenderness.  Pulmonary/Chest: Tachypnea noted. She has decreased breath sounds in the left middle field and the left lower field. She has wheezes in the right upper field and the right lower field. She has rales in the right upper field, the right middle field and the right lower field.  Abdominal: Soft. She exhibits no distension. There is no  tenderness. There is no guarding.  Musculoskeletal: Normal range of motion. She exhibits no edema or deformity.  Lymphadenopathy:    She has no cervical adenopathy.  Neurological: She is alert.  Cranial nerves grossly intact. Patient moves extremities symmetrically and with good coordination.  Skin: Skin is warm and dry. No rash noted. No erythema.  Skin of the thoracic areas without any erythema.  Psychiatric: She has a normal mood and affect. Her behavior is normal. Judgment and thought content normal.  Nursing note and vitals reviewed.    ED Treatments / Results  Labs (all labs ordered are listed, but only abnormal results are displayed) Labs Reviewed  BASIC METABOLIC PANEL - Abnormal; Notable for the following components:      Result Value   Sodium 127 (*)    Chloride 88 (*)    Glucose, Bld 135 (*)    All other components within normal limits  CBC - Abnormal; Notable for the following components:   WBC 11.2 (*)    All other components within normal limits  PROTIME-INR - Abnormal; Notable for the following components:   Prothrombin Time 26.9 (*)    All other components within normal limits  CULTURE, BLOOD (ROUTINE X 2)  CULTURE, BLOOD (ROUTINE X 2)  I-STAT TROPONIN, ED  POCT I-STAT TROPONIN I  I-STAT CG4 LACTIC ACID, ED    EKG EKG Interpretation  Date/Time:  Tuesday August 26 2018 16:31:20 EST Ventricular Rate:  95 PR Interval:    QRS Duration: 83 QT Interval:  346 QTC Calculation: 435 R Axis:   67 Text  Interpretation:  Sinus rhythm RSR' in V1 or V2, probably normal variant Confirmed by Virgel Manifold [phone]) on 08/26/2018 9:01:19 PM   Radiology Dg Chest 2 View  Result Date: 08/26/2018 CLINICAL DATA:  Chest pain and dyspnea. COPD. EXAM: CHEST - 2 VIEW COMPARISON:  05/08/2018 chest radiograph. FINDINGS: Stable cardiomediastinal silhouette with normal heart size. Asymmetric mild right hilar prominence is stable since 01/16/2017 chest CT angiogram study, where it was seen to be due to vascular shadows. No pneumothorax. No pleural effusion. Hyperinflated lungs. No pulmonary edema. Stable right middle lobe bandlike scarring. No acute consolidative airspace disease. Stable moderate midthoracic vertebral compression fracture since 06/30/2018 thoracic spine radiographs. IMPRESSION: 1. No acute cardiopulmonary disease. 2. Hyperinflated lungs, suggesting COPD. 3. Stable right middle lobe bandlike scarring. Electronically Signed   By: Ilona Sorrel M.D.   On: 08/26/2018 17:48   Ct Angio Chest Pe W/cm &/or Wo Cm  Result Date: 08/26/2018 CLINICAL DATA:  High pretest probability for pulmonary embolism. EXAM: CT ANGIOGRAPHY CHEST WITH CONTRAST TECHNIQUE: Multidetector CT imaging of the chest was performed using the standard protocol during bolus administration of intravenous contrast. Multiplanar CT image reconstructions and MIPs were obtained to evaluate the vascular anatomy. CONTRAST:  13m ISOVUE-370 IOPAMIDOL (ISOVUE-370) INJECTION 76% COMPARISON:  01/16/2017 FINDINGS: Cardiovascular: Normal heart size. No pericardial effusion. No acute pulmonary embolism. Truncated appearance of multiple peripheral pulmonary arteries, likely accentuated by motion. This finding is best demonstrated in the lingula, as demarcated on series 5. Patient has history of pulmonary embolism in 2008 and this is presumably related sequela. Main pulmonary artery is enlarged to 3.5 cm, suggesting pulmonary hypertension. Aortic and coronary  atherosclerosis Mediastinum/Nodes: Negative for adenopathy or mass. Lungs/Pleura: The bronchiectasis in the right middle lobe with collapse. Bronchiectasis with mucoid impaction within the anterior basal segment right lower lobe and anterior segment right upper lobe, chronic. Clustered micro nodularity in the posterior right lower lobe, new.  Upper Abdomen: Negative Musculoskeletal: Nonacute T8 and T12 compression fractures. T8 height loss is advanced and T12 height loss is mild. Both have occurred since April 2018. The T8 fracture appears chronic and is sclerotic. At T12, fracture lucencies are still visible. Review of the MIP images confirms the above findings. IMPRESSION: 1. Negative for acute pulmonary embolism. 2. 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 Large main pulmonary artery suggesting associated hypertension. 3. Right-sided bronchiectasis and mucoid impaction with right middle lobe collapse. No progression since 2018. There may be underlying chronic MAC infection. 4. Minimal infectious or inflammatory opacity in the right lower lobe likely related to #3, please correlate for active infectious symptoms. 5. Nonacute compression fractures of T8 and T12 that have occurred since 2018. The T12 fracture is likely subacute. Electronically Signed   By: Monte Fantasia M.D.   On: 08/26/2018 20:18    Procedures Procedures (including critical care time)   Medications Ordered in ED Medications  sodium chloride 0.9 % bolus 500 mL (500 mLs Intravenous New Bag/Given 08/26/18 2020)  sodium chloride (PF) 0.9 % injection (has no administration in time range)  iopamidol (ISOVUE-370) 76 % injection (has no administration in time range)  cefTRIAXone (ROCEPHIN) 1 g in sodium chloride 0.9 % 100 mL IVPB (has no administration in time range)  azithromycin (ZITHROMAX) 500 mg in sodium chloride 0.9 % 250 mL IVPB (has no administration  in time range)  albuterol (PROVENTIL) (2.5 MG/3ML) 0.083% nebulizer solution 5 mg (5 mg Nebulization Given 08/26/18 2012)  ipratropium (ATROVENT) nebulizer solution 0.5 mg (0.5 mg Nebulization Given 08/26/18 2012)  methylPREDNISolone sodium succinate (SOLU-MEDROL) 125 mg/2 mL injection 60 mg (60 mg Intravenous Given 08/26/18 2018)  iopamidol (ISOVUE-370) 76 % injection 100 mL (100 mLs Intravenous Contrast Given 08/26/18 1943)     Initial Impression / Assessment and Plan / ED Course  I have reviewed the triage vital signs and the nursing notes.  Pertinent labs & imaging results that were available during my care of the patient were reviewed by me and considered in my medical decision making (see chart for details).  Clinical Course as of Aug 26 2118  Tue Aug 26, 2018  1818 Chronic over time.   Sodium(!): 127 [AM]  2043 Therapeutic.  INR: 2.53 [AM]  2113 Reassessed.  Improving respiratory status and lung exam.   [AM]  2117 Spoke with Dr. Myna Hidalgo of [facility] hospitalist will admit patient.  I appreciate his involvement of the care of this patient.   [AM]    Clinical Course User Index [AM] Albesa Seen, PA-C    Patient nontoxic-appearing, afebrile, but does have increased respiratory effort.  She is tachypneic.  Initial diagnosis includes pulmonary embolism, COPD exacerbation, pneumonia, ACS, bronchitis.  Work-up is remarkable for slight leukocytosis of 11.2.  patient hyponatremic to 177 today, but this appears chronic.  Patient has not had full work-up outpatient for this.  Patient is denying any changes in mentation, memory, or confusion at this time.  EKG in normal sinus rhythm with prolonged PR without any evidence of ischemia, infarction, or arrhythmia.  Troponin is negative.  CT angios demonstrates no evidence of pulmonary embolism, but does show opacity in the right lower lobe.  Patient is not febrile, but does have leukocytosis, as well as increased sputum production.  She has not  been on steroids.  Clinically with asymmetric wheeze, this is consistent with infiltrate.  Clinically, no signs of Sirs/sepsis. Blood cultures  and lactic acid pending. Will admit.  Case discussed with Dr. Virgel Manifold, attending physician, is in agreement with plan of care.  Final Clinical Impressions(s) / ED Diagnoses   Final diagnoses:  Community acquired pneumonia of right lower lobe of lung [facility])  Hypoxia  Hyponatremia    ED Discharge Orders    None       Tamala Julian 08/26/18 2120    Virgel Manifold, MD 08/29/18 0710

## 2018-08-26 NOTE — H&P (Signed)
History and Physical    [name] MBW:[id number] DOB: 12-03-1929 DOA: 08/26/2018  PCP: [name], MD   Patient coming from: home   Chief Complaint: SOB, productive cough, chest pain   HPI: [name] is a 84 y.o. female with medical history significant for chronic bronchitis, hypertension, history of PE on Coumadin, vertebral compression fractures status post recent kyphoplasty, and hyponatremia, now presenting to the emergency department for evaluation of productive cough and chest pain.  Patient reports developing increase in her chronic dyspnea with a new cough productive of thick dark sputum over the past few days.  She developed chest pain, worse with breathing, last night.  Pain resolved within a couple hours, but returned today, more severe and persistent now.  Patient denies fevers or chills.  She denies swelling or tenderness in the lower extremities.  ED Course: Upon arrival to the ED, patient is found to be afebrile, saturating 88% on room air, tachypneic, and with vitals otherwise stable.  EKG features a sinus rhythm.  Chest x-ray is negative for acute cardiopulmonary disease.  CTA chest is negative for PE, but notable for opacity in the right lower lobe concerning for infection.  Chemistry panel is notable for a sodium of 127, similar to prior.  CBC features a mild leukocytosis and INR is therapeutic at 2.53.  Troponin is undetectable.  Blood cultures were collected and the patient was treated with 125 mg IV Solu-Medrol, Rocephin, azithromycin, and duo nebs in the ED.  She remains dyspneic at rest and will be observed for ongoing evaluation and management.  Review of Systems:  All other systems reviewed and apart from HPI, are negative.  Past Medical History:  Diagnosis Date  . ALLERGIC RHINITIS   . ANXIETY   . Asthma   . Bronchiectasis 12/11/2011   CT chest dx  . CAROTID ARTERY STENOSIS, RIGHT   . Chronic idiopathic constipation   . Chronic rhinitis   .  COMMON MIGRAINE   . COPD   . CORONARY ARTERY DISEASE   . CYST/PSEUDOCYST, PANCREAS   . DEPRESSION   . DIVERTICULOSIS, COLON   . DIZZINESS, CHRONIC   . Dyspnea    on exertion  . Esophageal stricture   . GERD   . HYPERLIPIDEMIA   . HYPERTENSION   . Internal hemorrhoids with complication   . OSTEOPOROSIS   . OVERACTIVE BLADDER   . Pulmonary embolism ([place]) 2008  . PULMONARY HYPERTENSION, SECONDARY   . Stroke ([place])   . Tubular adenoma of colon 10/2013  . Unspecified hearing loss     Past Surgical History:  Procedure Laterality Date  . ABDOMINAL HYSTERECTOMY  1965  . APPENDECTOMY  1963  . CHOLECYSTECTOMY  1963  . COLONOSCOPY N/A 10/19/2013   Procedure: COLONOSCOPY;  Surgeon: Ladene Artist, MD;  Location: [facility];  Service: Endoscopy;  Laterality: N/A;  . EGD  1999  . KYPHOPLASTY N/A 08/20/2018   Procedure: Lumbar three KYPHOPLASTY;  Surgeon: Ashok Pall, MD;  Location: [facility];  Service: Neurosurgery;  Laterality: N/A;  . PANCREATIC CYST DRAINAGE     x 2  . ROOT CANAL    . s/p sinus surgury    . TONSILLECTOMY       reports that she has never smoked. She has never used smokeless tobacco. She reports that she does not drink alcohol or use drugs.  Allergies  Allergen Reactions  . Raloxifene Other (See Comments)    REACTION: risk of recurrent stroke  . Sulfonamide  Derivatives Anaphylaxis    REACTION: tongue swells  . Amoxicillin-Pot Clavulanate Diarrhea    REACTION: diarrhea Has patient had a PCN reaction causing immediate rash, facial/tongue/throat swelling, SOB or lightheadedness with hypotension: unknown Has patient had a PCN reaction causing severe rash involving mucus membranes or skin necrosis: unknown Has patient had a PCN reaction that required hospitalization : unknown Has patient had a PCN reaction occurring within the last 10 years: yes   pt states she should be able to take penicillin, but cant remember actually taking it   . Ciclesonide Other (See  Comments)    REACTION: bad smell  . Ciprofloxacin Other (See Comments)    dizziness    Family History  Problem Relation Age of Onset  . Heart disease Mother   . Heart attack Mother        first one age 38  . Emphysema Father        was a smoker  . Stroke Father   . Breast cancer Daughter   . Lymphoma Sister   . Colon cancer Neg Hx   . Esophageal cancer Neg Hx   . Rectal cancer Neg Hx      Prior to Admission medications   Medication Sig Start Date End Date Taking? Authorizing Provider  acetaminophen (TYLENOL) 500 MG tablet Take 1,000 mg by mouth every 6 (six) hours as needed for moderate pain.   Yes [provider]  amLODipine (NORVASC) 5 MG tablet TAKE 1 TABLET EVERY DAY 05/06/18  Yes [name], MD  budesonide (RHINOCORT ALLERGY) 32 MCG/ACT nasal spray Place 1 spray into both nostrils daily.    Yes [provider]  cyclobenzaprine (FLEXERIL) 10 MG tablet Take 1 tablet (10 mg total) by mouth 3 (three) times daily as needed for muscle spasms. 05/26/18  Yes [name], MD  Digestive Enzymes (PAPAYA AND ENZYMES) CHEW Chew 2 tablets by mouth 3 (three) times daily with meals as needed (digestion of big meals).    Yes [provider]  HYDROcodone-acetaminophen (NORCO/VICODIN) 5-325 MG tablet Take 1 tablet by mouth every 6 (six) hours as needed for moderate pain. 08/21/18  Yes Ashok Pall, MD  loratadine (CLARITIN) 10 MG tablet Take 10 mg by mouth daily as needed for allergies.    Yes [provider]  losartan (COZAAR) 100 MG tablet TAKE 1 TABLET EVERY DAY 06/10/18  Yes [name], MD  Multiple Vitamins-Minerals [facility]) TABS Take 1 tablet by mouth daily.     Yes [provider]  Probiotic Product (PROBIOTIC DAILY PO) Take 1 tablet by mouth daily.   Yes [provider]  VENTOLIN HFA 108 (90 Base) MCG/ACT inhaler INHALE 2 PUFFS INTO THE LUNGS EVERY 6 HOURS AS NEEDED FOR WHEEZING OR SHORTNESS OF BREATH. Patient taking  differently: Inhale 2 puffs into the lungs every 6 (six) hours as needed.  05/08/18  Yes [name], MD  warfarin (COUMADIN) 5 MG tablet TAKE AS DIRECTED BY ANTICOAGULATION CLINIC Patient taking differently: Take 2.5-5 mg by mouth daily. 5 mg on Mon and Fri 2.5 mg on Tues, Wed, Thurs, Sat and Sun 05/06/18  Yes [name], MD  albuterol (ACCUNEB) 0.63 MG/3ML nebulizer solution Take 3 mLs (0.63 mg total) by nebulization every 6 (six) hours as needed for wheezing. 06/28/17   [name], MD  promethazine (PHENERGAN) 25 MG tablet Take 1 tablet (25 mg total) by mouth every 6 (six) hours as needed for nausea or vomiting. 11/07/17   [name],  Hunt Oris, MD  sertraline (ZOLOFT) 50 MG tablet Take 1 tablet (50 mg total) by mouth daily. Patient not taking: Reported on 08/26/2018 05/26/18   [name], MD    Physical Exam: Vitals:   08/26/18 2005 08/26/18 2016 08/26/18 2102 08/26/18 2158  BP: (!) 133/98 (!) 133/98 (!) 164/71 (!) 171/66  Pulse: 81 81 91 89  Resp: (!) 28 (!) 21 (!) 29 (!) 31  Temp:      TempSrc:      SpO2: 98% 99% 94% 92%  Weight:      Height:        Constitutional: NAD, calm  Eyes: PERTLA, lids and conjunctivae normal ENMT: Mucous membranes are moist. Posterior pharynx clear of any exudate or lesions.   Neck: normal, supple, no masses, no thyromegaly Respiratory: tachypnea, scattered rhonchi and wheezes. Normal respiratory effort. .  Cardiovascular: S1 & S2 heard, regular rate and rhythm. No extremity edema.   Abdomen: No distension, no tenderness, soft. Bowel sounds normal.  Musculoskeletal: no clubbing / cyanosis. No joint deformity upper and lower extremities.    Skin: no significant rashes, lesions, ulcers. Warm, dry, well-perfused. Neurologic: No facial asymmetry. Sensation intact. Moving all extremities.  Psychiatric:  Alert and oriented to person, place, and situation. Normal mood and affect.    Labs on Admission: I have personally reviewed following labs and imaging  studies  CBC: Recent Labs  Lab 08/26/18 1657  WBC 11.2*  HGB 14.3  HCT 43.2  MCV 95.8  PLT 509   Basic Metabolic Panel: Recent Labs  Lab 08/26/18 1657  NA 127*  K 3.9  CL 88*  CO2 27  GLUCOSE 135*  BUN 11  CREATININE 0.52  CALCIUM 9.4   GFR: Estimated Creatinine Clearance: 45.4 mL/min (by C-G formula based on SCr of 0.52 mg/dL). Liver Function Tests: No results for input(s): AST, ALT, ALKPHOS, BILITOT, PROT, ALBUMIN in the last 168 hours. No results for input(s): LIPASE, AMYLASE in the last 168 hours. No results for input(s): AMMONIA in the last 168 hours. Coagulation Profile: Recent Labs  Lab 08/20/18 0656 08/26/18 2010  INR 0.99 2.53   Cardiac Enzymes: No results for input(s): CKTOTAL, CKMB, CKMBINDEX, TROPONINI in the last 168 hours. BNP (last 3 results) No results for input(s): PROBNP in the last 8760 hours. HbA1C: No results for input(s): HGBA1C in the last 72 hours. CBG: No results for input(s): GLUCAP in the last 168 hours. Lipid Profile: No results for input(s): CHOL, HDL, LDLCALC, TRIG, CHOLHDL, LDLDIRECT in the last 72 hours. Thyroid Function Tests: No results for input(s): TSH, T4TOTAL, FREET4, T3FREE, THYROIDAB in the last 72 hours. Anemia Panel: No results for input(s): VITAMINB12, FOLATE, FERRITIN, TIBC, IRON, RETICCTPCT in the last 72 hours. Urine analysis:    Component Value Date/Time   COLORURINE YELLOW 07/30/2018 [place] 07/30/2018 1652   LABSPEC 1.006 07/30/2018 1652   PHURINE 5.0 07/30/2018 1652   GLUCOSEU NEGATIVE 07/30/2018 1652   GLUCOSEU NEGATIVE 11/21/2017 1632   HGBUR LARGE (A) 07/30/2018 1652   BILIRUBINUR NEGATIVE 07/30/2018 1652   BILIRUBINUR negatie 05/08/2018 1439   KETONESUR 5 (A) 07/30/2018 1652   PROTEINUR NEGATIVE 07/30/2018 1652   UROBILINOGEN 0.2 05/08/2018 1439   UROBILINOGEN 0.2 11/21/2017 1632   NITRITE NEGATIVE 07/30/2018 1652   LEUKOCYTESUR NEGATIVE 07/30/2018 1652   Sepsis  Labs: _0 (procalcitonin:4,lacticidven:4) )No results found for this or any previous visit (from the past 240 hour(s)).   Radiological Exams on Admission: Dg Chest 2 View  Result Date: 08/26/2018 CLINICAL DATA:  Chest pain and dyspnea. COPD. EXAM: CHEST - 2 VIEW COMPARISON:  05/08/2018 chest radiograph. FINDINGS: Stable cardiomediastinal silhouette with normal heart size. Asymmetric mild right hilar prominence is stable since 01/16/2017 chest CT angiogram study, where it was seen to be due to vascular shadows. No pneumothorax. No pleural effusion. Hyperinflated lungs. No pulmonary edema. Stable right middle lobe bandlike scarring. No acute consolidative airspace disease. Stable moderate midthoracic vertebral compression fracture since 06/30/2018 thoracic spine radiographs. IMPRESSION: 1. No acute cardiopulmonary disease. 2. Hyperinflated lungs, suggesting COPD. 3. Stable right middle lobe bandlike scarring. Electronically Signed   By: Ilona Sorrel M.D.   On: 08/26/2018 17:48   Ct Angio Chest Pe W/cm &/or Wo Cm  Result Date: 08/26/2018 CLINICAL DATA:  High pretest probability for pulmonary embolism. EXAM: CT ANGIOGRAPHY CHEST WITH CONTRAST TECHNIQUE: Multidetector CT imaging of the chest was performed using the standard protocol during bolus administration of intravenous contrast. Multiplanar CT image reconstructions and MIPs were obtained to evaluate the vascular anatomy. CONTRAST:  123m ISOVUE-370 IOPAMIDOL (ISOVUE-370) INJECTION 76% COMPARISON:  01/16/2017 FINDINGS: Cardiovascular: Normal heart size. No pericardial effusion. No acute pulmonary embolism. Truncated appearance of multiple peripheral pulmonary arteries, likely accentuated by motion. This finding is best demonstrated in the lingula, as demarcated on series 5. Patient has history of pulmonary embolism in 2008 and this is presumably related sequela. Main pulmonary artery is enlarged to 3.5 cm, suggesting pulmonary hypertension. Aortic  and coronary atherosclerosis Mediastinum/Nodes: Negative for adenopathy or mass. Lungs/Pleura: The bronchiectasis in the right middle lobe with collapse. Bronchiectasis with mucoid impaction within the anterior basal segment right lower lobe and anterior segment right upper lobe, chronic. Clustered micro nodularity in the posterior right lower lobe, new. Upper Abdomen: Negative Musculoskeletal: Nonacute T8 and T12 compression fractures. T8 height loss is advanced and T12 height loss is mild. Both have occurred since April 2018. The T8 fracture appears chronic and is sclerotic. At T12, fracture lucencies are still visible. Review of the MIP images confirms the above findings. IMPRESSION: 1. Negative for acute pulmonary embolism. 2. 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 Large main pulmonary artery suggesting associated hypertension. 3. Right-sided bronchiectasis and mucoid impaction with right middle lobe collapse. No progression since 2018. There may be underlying chronic MAC infection. 4. Minimal infectious or inflammatory opacity in the right lower lobe likely related to #3, please correlate for active infectious symptoms. 5. Nonacute compression fractures of T8 and T12 that have occurred since 2018. The T12 fracture is likely subacute. Electronically Signed   By: JMonte FantasiaM.D.   On: 08/26/2018 20:18    EKG: Independently reviewed. Sinus rhythm, RSR' in V2.   Assessment/Plan   1. CAP  - Presents with productive cough and pleuritic chest pain, found to have leukocytosis and CTA neg for PE but concerning for right-sided PNA  - Blood cultures were collected in ED and she was started on empiric Rocephin and azithromycin  - Continue current antibiotics and supportive care, follow cultures and clinical course   2. COPD with acute exacerbation  - Likely precipitated by infection  - Treated in ED with nebs, abx,  and 125 mg IV Solu-Medrol  - Check sputum culture, continue systemic steroids, antibiotics, and albuterol nebs    3. History of PE  - No acute PE on CTA  - INR is therapeutic, continue warfarin    4. Chest pain  - Pleuritic in nature,  likely secondary to PNA as CTA neg for PE, troponin 0.00, and EKG without acute ischemic features  - Treat PNA, continue cardiac monitoring, check another troponin   5. Hyponatremia  - Serum sodium is 127 on admission, stable over the past month  - Pt appears hypovolemic  - Check urine sodium and urine osmolality, start gentle IVF hydration, repeat chem panel in am    DVT prophylaxis: warfarin  Code Status: Full  Family Communication: Daughter updated at bedside Consults called: None Admission status: Observation     Vianne Bulls, MD [facility] Pager [phone]  If 7PM-7AM, please contact night-coverage [email or web address] Password [email or web address]  08/26/2018, 10:03 PM

## 2018-08-26 NOTE — ED Notes (Signed)
Spoke with pharmacy. Will verify meds since holding pt in ED

## 2018-08-26 NOTE — Progress Notes (Signed)
[place] for warfarin Indication: Hx VTE  Allergies  Allergen Reactions  . Raloxifene Other (See Comments)    REACTION: risk of recurrent stroke  . Sulfonamide Derivatives Anaphylaxis    REACTION: tongue swells  . Amoxicillin-Pot Clavulanate Diarrhea    REACTION: diarrhea Has patient had a PCN reaction causing immediate rash, facial/tongue/throat swelling, SOB or lightheadedness with hypotension: unknown Has patient had a PCN reaction causing severe rash involving mucus membranes or skin necrosis: unknown Has patient had a PCN reaction that required hospitalization : unknown Has patient had a PCN reaction occurring within the last 10 years: yes   pt states she should be able to take penicillin, but cant remember actually taking it   . Ciclesonide Other (See Comments)    REACTION: bad smell  . Ciprofloxacin Other (See Comments)    dizziness    Patient Measurements: Height: 5\' 5"  (165.1 cm) Weight: 147 lb (66.7 kg) IBW/kg (Calculated) : 57   Vital Signs: Temp: 97.5 F (36.4 C) (11/19 1633) Temp Source: Oral (11/19 1633) BP: 171/66 (11/19 2158) Pulse Rate: 89 (11/19 2158)  Labs: Recent Labs    08/26/18 1657 08/26/18 2010  HGB 14.3  --   HCT 43.2  --   PLT 306  --   LABPROT  --  26.9*  INR  --  2.53  CREATININE 0.52  --     Estimated Creatinine Clearance: 45.4 mL/min (by C-G formula based on SCr of 0.52 mg/dL).   Medical History: Past Medical History:  Diagnosis Date  . ALLERGIC RHINITIS   . ANXIETY   . Asthma   . Bronchiectasis 12/11/2011   CT chest dx  . CAROTID ARTERY STENOSIS, RIGHT   . Chronic idiopathic constipation   . Chronic rhinitis   . COMMON MIGRAINE   . COPD   . CORONARY ARTERY DISEASE   . CYST/PSEUDOCYST, PANCREAS   . DEPRESSION   . DIVERTICULOSIS, COLON   . DIZZINESS, CHRONIC   . Dyspnea    on exertion  . Esophageal stricture   . GERD   . HYPERLIPIDEMIA   . HYPERTENSION   . Internal  hemorrhoids with complication   . OSTEOPOROSIS   . OVERACTIVE BLADDER   . Pulmonary embolism ([place]) 2008  . PULMONARY HYPERTENSION, SECONDARY   . Stroke ([place])   . Tubular adenoma of colon 10/2013  . Unspecified hearing loss     Medications:   (Not in a hospital admission) Scheduled:  . amLODipine  5 mg Oral Daily  . iopamidol      . losartan  100 mg Oral Daily  . [START ON 08/27/2018] methylPREDNISolone (SOLU-MEDROL) injection  60 mg Intravenous Q6H  . sodium chloride (PF)      . warfarin  2.5 mg Oral Once  . [START ON 08/27/2018] Warfarin - Pharmacist Dosing Inpatient   Does not apply q1800   PRN: acetaminophen **OR** acetaminophen, albuterol, cyclobenzaprine, HYDROcodone-acetaminophen, morphine injection, ondansetron **OR** ondansetron (ZOFRAN) IV, senna-docusate  Assessment: 55 yoF with Hx PE on warfarin admitted for CAP +/- AECOPD. Pharmacy to dose warfarin while admitted   Baseline INR therapeutic  Prior anticoagulation: warfarin 2.5 mg daily except 5 mg on Mon & Fri; last dose 11/18 PM  Significant events:  Today, 08/26/2018:  CBC: WNL  INR therapeutic  Major drug interactions: none  No bleeding issues per nursing  Goal of Therapy: INR 2-3  Plan:  Warfarin 2.5 mg PO tonight at 18:00 - would dose conservatively (ie. 2.5 mg daily)  while acutely ill until INR trend becomes apparent  Daily INR  CBC at least q72 hr while on warfarin  Monitor for signs of bleeding or thrombosis   Reuel Boom, PharmD, BCPS [phone] 08/26/2018, 10:22 PM

## 2018-08-26 NOTE — ED Triage Notes (Addendum)
Patient reports that she has chronic bronchitis and has a productive cough with green sputum. Patient states she began having mid chest pain last night, but it went away. Chest pain occurred around 1200 today and did not go away. Patient states the chest pain is worse when she lays flat.  Patient states she had been off of Coumadin prior to a back procedure and has been back on coumadin x 5 days.

## 2018-08-26 NOTE — ED Notes (Signed)
Patient transported to CT 

## 2018-08-26 NOTE — ED Notes (Signed)
Unable to obtain 2nd set of cultures. x2 tries by 2 staff members.

## 2018-08-26 NOTE — ED Notes (Signed)
Patient transported to X-ray 

## 2018-08-26 NOTE — ED Notes (Addendum)
Pt aware that sputum sample is needed. Unable to provide at this time

## 2018-08-26 NOTE — ED Notes (Signed)
I gave critical I stat CG4 result to PA [facility]

## 2018-08-27 ENCOUNTER — Inpatient Hospital Stay (HOSPITAL_COMMUNITY): Payer: Medicare Other

## 2018-08-27 DIAGNOSIS — I10 Essential (primary) hypertension: Secondary | ICD-10-CM | POA: Diagnosis present

## 2018-08-27 DIAGNOSIS — M4854XA Collapsed vertebra, not elsewhere classified, thoracic region, initial encounter for fracture: Secondary | ICD-10-CM | POA: Diagnosis present

## 2018-08-27 DIAGNOSIS — K219 Gastro-esophageal reflux disease without esophagitis: Secondary | ICD-10-CM | POA: Diagnosis present

## 2018-08-27 DIAGNOSIS — M810 Age-related osteoporosis without current pathological fracture: Secondary | ICD-10-CM | POA: Diagnosis present

## 2018-08-27 DIAGNOSIS — I2510 Atherosclerotic heart disease of native coronary artery without angina pectoris: Secondary | ICD-10-CM | POA: Diagnosis present

## 2018-08-27 DIAGNOSIS — E861 Hypovolemia: Secondary | ICD-10-CM | POA: Diagnosis present

## 2018-08-27 DIAGNOSIS — X58XXXA Exposure to other specified factors, initial encounter: Secondary | ICD-10-CM | POA: Diagnosis present

## 2018-08-27 DIAGNOSIS — J189 Pneumonia, unspecified organism: Secondary | ICD-10-CM | POA: Diagnosis present

## 2018-08-27 DIAGNOSIS — I69354 Hemiplegia and hemiparesis following cerebral infarction affecting left non-dominant side: Secondary | ICD-10-CM | POA: Diagnosis not present

## 2018-08-27 DIAGNOSIS — J470 Bronchiectasis with acute lower respiratory infection: Secondary | ICD-10-CM | POA: Diagnosis present

## 2018-08-27 DIAGNOSIS — M6281 Muscle weakness (generalized): Secondary | ICD-10-CM | POA: Diagnosis not present

## 2018-08-27 DIAGNOSIS — Z9049 Acquired absence of other specified parts of digestive tract: Secondary | ICD-10-CM | POA: Diagnosis not present

## 2018-08-27 DIAGNOSIS — M4856XA Collapsed vertebra, not elsewhere classified, lumbar region, initial encounter for fracture: Secondary | ICD-10-CM | POA: Diagnosis present

## 2018-08-27 DIAGNOSIS — T380X5A Adverse effect of glucocorticoids and synthetic analogues, initial encounter: Secondary | ICD-10-CM | POA: Diagnosis present

## 2018-08-27 DIAGNOSIS — E785 Hyperlipidemia, unspecified: Secondary | ICD-10-CM | POA: Diagnosis present

## 2018-08-27 DIAGNOSIS — Z881 Allergy status to other antibiotic agents status: Secondary | ICD-10-CM | POA: Diagnosis not present

## 2018-08-27 DIAGNOSIS — J471 Bronchiectasis with (acute) exacerbation: Secondary | ICD-10-CM | POA: Diagnosis present

## 2018-08-27 DIAGNOSIS — Z86711 Personal history of pulmonary embolism: Secondary | ICD-10-CM | POA: Diagnosis not present

## 2018-08-27 DIAGNOSIS — J181 Lobar pneumonia, unspecified organism: Secondary | ICD-10-CM

## 2018-08-27 DIAGNOSIS — D72829 Elevated white blood cell count, unspecified: Secondary | ICD-10-CM | POA: Diagnosis present

## 2018-08-27 DIAGNOSIS — E871 Hypo-osmolality and hyponatremia: Secondary | ICD-10-CM | POA: Diagnosis present

## 2018-08-27 DIAGNOSIS — I272 Pulmonary hypertension, unspecified: Secondary | ICD-10-CM | POA: Diagnosis present

## 2018-08-27 DIAGNOSIS — K5904 Chronic idiopathic constipation: Secondary | ICD-10-CM | POA: Diagnosis present

## 2018-08-27 DIAGNOSIS — Z90710 Acquired absence of both cervix and uterus: Secondary | ICD-10-CM | POA: Diagnosis not present

## 2018-08-27 DIAGNOSIS — Z880 Allergy status to penicillin: Secondary | ICD-10-CM | POA: Diagnosis not present

## 2018-08-27 DIAGNOSIS — I6521 Occlusion and stenosis of right carotid artery: Secondary | ICD-10-CM | POA: Diagnosis present

## 2018-08-27 DIAGNOSIS — H9190 Unspecified hearing loss, unspecified ear: Secondary | ICD-10-CM | POA: Diagnosis present

## 2018-08-27 LAB — BASIC METABOLIC PANEL
Anion gap: 10 (ref 5–15)
BUN: 8 mg/dL (ref 8–23)
CALCIUM: 8.6 mg/dL — AB (ref 8.9–10.3)
CHLORIDE: 96 mmol/L — AB (ref 98–111)
CO2: 24 mmol/L (ref 22–32)
CREATININE: 0.4 mg/dL — AB (ref 0.44–1.00)
GFR calc Af Amer: >60 mL/min (ref >60)
GFR calc non Af Amer: >60 mL/min (ref >60)
GLUCOSE: 158 mg/dL — AB (ref 70–99)
Potassium: 3.5 mmol/L (ref 3.5–5.1)
Sodium: 130 mmol/L — ABNORMAL LOW (ref 135–145)

## 2018-08-27 LAB — PROTIME-INR
INR: 2.84
Prothrombin Time: 29.4 seconds — ABNORMAL HIGH (ref 11.4–15.2)

## 2018-08-27 LAB — TROPONIN I
TROPONIN I: 0.04 ng/mL — AB (ref <0.03)
Troponin I: 0.04 ng/mL (ref <0.03)
Troponin I: <0.03 ng/mL (ref <0.03)

## 2018-08-27 LAB — LACTIC ACID, PLASMA
LACTIC ACID, VENOUS: 1.8 mmol/L (ref 0.5–1.9)
LACTIC ACID, VENOUS: 1.3 mmol/L (ref 0.5–1.9)

## 2018-08-27 LAB — GLUCOSE, CAPILLARY: Glucose-Capillary: 166 mg/dL — ABNORMAL HIGH (ref 70–99)

## 2018-08-27 LAB — STREP PNEUMONIAE URINARY ANTIGEN: Strep Pneumo Urinary Antigen: NEGATIVE

## 2018-08-27 LAB — OSMOLALITY, URINE: OSMOLALITY UR: 277 mosm/kg — AB (ref 300–900)

## 2018-08-27 LAB — SODIUM, URINE, RANDOM: Sodium, Ur: 21 mmol/L

## 2018-08-27 MED ORDER — PREDNISONE 20 MG PO TABS
40.0000 mg | ORAL_TABLET | Freq: Every day | ORAL | Status: DC
  Administered 2018-08-28: 40 mg via ORAL
  Filled 2018-08-27: qty 2

## 2018-08-27 MED ORDER — SODIUM CHLORIDE 0.9 % IV BOLUS
500.0000 mL | Freq: Once | INTRAVENOUS | Status: AC
  Administered 2018-08-27: 500 mL via INTRAVENOUS

## 2018-08-27 MED ORDER — WARFARIN SODIUM 2.5 MG PO TABS
2.5000 mg | ORAL_TABLET | Freq: Once | ORAL | Status: AC
  Administered 2018-08-27: 2.5 mg via ORAL
  Filled 2018-08-27: qty 1

## 2018-08-27 MED ORDER — ORAL CARE MOUTH RINSE
15.0000 mL | Freq: Two times a day (BID) | OROMUCOSAL | Status: DC
  Administered 2018-08-27 – 2018-08-28 (×3): 15 mL via OROMUCOSAL

## 2018-08-27 NOTE — Progress Notes (Signed)
Florene Glen, MD was notified via text page of the pt's troponin level of 0.04 at 0945.

## 2018-08-27 NOTE — Progress Notes (Signed)
PROGRESS NOTE    MYSTIQUE BJELLAND  VOP:[id number] DOB: [birth year] DOA: 08/26/2018 PCP: [name], MD   Brief Narrative:  [name] is a 84 y.o. female with medical history significant for chronic bronchitis, hypertension, history of PE on Coumadin, vertebral compression fractures status post recent kyphoplasty, and hyponatremia, now presenting to the emergency department for evaluation of productive cough and chest pain.  Patient reports developing increase in her chronic dyspnea with a new cough productive of thick dark sputum over the past few days.  She developed chest pain, worse with breathing, last night.  Pain resolved within a couple hours, but returned today, more severe and persistent now.  Patient denies fevers or chills.  She denies swelling or tenderness in the lower extremities.  Assessment & Plan:   Principal Problem:   CAP (community acquired pneumonia) Active Problems:   COPD with acute exacerbation ([place])   History of pulmonary embolism   Hyponatremia   History of vertebral compression fracture   1. CAP  - Presents with productive cough and pleuritic chest pain, found to have leukocytosis and CTA neg for PE but concerning for right-sided PNA  - follow blood cx - continue CAP coverage with ceftriaxone/azithromycin  - Pt with bronchiectasis and mucoid impaction with RML collapse, ? Of underlying chronic MAC infection.  She sees Dr. [name] as outpatient, will have her follow up with Dr. [name] for this.  # LUE Weakness: pt notes LUE weakness, she's unable to pinpoint timing of this, notes maybe within past few days.  Denies back pain, neck pain.  Exam notable for 4/5 strength to LUE.  Follow MRI brain.  PT evaluation.  Continue to monitor.  2. COPD with acute exacerbation  - Likely precipitated by infection  - Transition to PO steroids tomorrow, no wheezing today, plan for 5 day course of steroids - Continue scheduled and prn nebs and abx  3. History of PE    - No acute PE on CTA  - INR is therapeutic, continue warfarin    4. Chest pain  - Pleuritic in nature, likely secondary to PNA as CTA neg for PE - EKG without ischemic changes - troponin mildly elevated, but flat and as pt without EKG findings concerning for ischemia and with pleuritic CP, suspect this is related to pneumonia  5. Hyponatremia  - improved, continue to follow, mild - Pt appears hypovolemic   Compression Fx of T8 and T12: non acute, T12 appeared subacute based on imaging, continue to monitor  DVT prophylaxis: warfarin Code Status: full  Family Communication: daughter at bedside Disposition Plan: likely home within next 24-48 hours pending improvement, weaning O2, transition to PO abx   Consultants:   none  Procedures:   none  Antimicrobials:  Anti-infectives (From admission, onward)   Start     Dose/Rate Route Frequency Ordered Stop   08/27/18 2200  azithromycin (ZITHROMAX) 500 mg in sodium chloride 0.9 % 250 mL IVPB     500 mg 250 mL/hr over 60 Minutes Intravenous Every 24 hours 08/26/18 2202 09/02/18 2159   08/27/18 2000  cefTRIAXone (ROCEPHIN) 1 g in sodium chloride 0.9 % 100 mL IVPB     1 g 200 mL/hr over 30 Minutes Intravenous Every 24 hours 08/26/18 2202 09/02/18 1959   08/26/18 2100  cefTRIAXone (ROCEPHIN) 1 g in sodium chloride 0.9 % 100 mL IVPB     1 g 200 mL/hr over 30 Minutes Intravenous  Once 08/26/18 2048 08/26/18 2244   08/26/18  2100  azithromycin (ZITHROMAX) 500 mg in sodium chloride 0.9 % 250 mL IVPB     500 mg 250 mL/hr over 60 Minutes Intravenous  Once 08/26/18 2048 08/27/18 0006     Subjective: Presented with 2 nights of pleuritic CP Cough, SOB. Noticed over past few days, LUE weakness as well, can't pinpoint exactly This is first time her daughter has heard of this   Objective: Vitals:   08/27/18 0829 08/27/18 0835 08/27/18 1416 08/27/18 1551  BP: (!) 137/57  (!) 163/65 (!) 144/70  Pulse: 79  86   Resp:      Temp:   98.2  F (36.8 C)   TempSrc:   Oral   SpO2: (!) 89% 93% 95%   Weight:      Height:        Intake/Output Summary (Last 24 hours) at 08/27/2018 1931 Last data filed at 08/27/2018 1016 Gross per 24 hour  Intake 1368.86 ml  Output 600 ml  Net 768.86 ml   Filed Weights   08/26/18 1633  Weight: 66.7 kg    Examination:  General exam: Appears calm and comfortable  Respiratory system: Clear to auscultation. Respiratory effort normal. Cardiovascular system: S1 & S2 heard, RRR Gastrointestinal system: Abdomen is nondistended, soft and nontender Central nervous system: Alert and oriented. CN 2-12 intact.  5/5 strength to all extremities except 4/5 to LUE Extremities: trace LEE Skin: No rashes, lesions or ulcers Psychiatry: Judgement and insight appear normal. Mood & affect appropriate.     Data Reviewed: I have personally reviewed following labs and imaging studies  CBC: Recent Labs  Lab 08/26/18 1657  WBC 11.2*  HGB 14.3  HCT 43.2  MCV 95.8  PLT 026   Basic Metabolic Panel: Recent Labs  Lab 08/26/18 1657 08/27/18 0414  NA 127* 130*  K 3.9 3.5  CL 88* 96*  CO2 27 24  GLUCOSE 135* 158*  BUN 11 8  CREATININE 0.52 0.40*  CALCIUM 9.4 8.6*   GFR: Estimated Creatinine Clearance: 45.4 mL/min (A) (by C-G formula based on SCr of 0.4 mg/dL (L)). Liver Function Tests: No results for input(s): AST, ALT, ALKPHOS, BILITOT, PROT, ALBUMIN in the last 168 hours. No results for input(s): LIPASE, AMYLASE in the last 168 hours. No results for input(s): AMMONIA in the last 168 hours. Coagulation Profile: Recent Labs  Lab 08/26/18 2010 08/27/18 0414  INR 2.53 2.84   Cardiac Enzymes: Recent Labs  Lab 08/26/18 2345 08/27/18 0414 08/27/18 0945  TROPONINI <0.03 0.04* 0.04*   BNP (last 3 results) No results for input(s): PROBNP in the last 8760 hours. HbA1C: No results for input(s): HGBA1C in the last 72 hours. CBG: Recent Labs  Lab 08/27/18 1120  GLUCAP 166*   Lipid  Profile: No results for input(s): CHOL, HDL, LDLCALC, TRIG, CHOLHDL, LDLDIRECT in the last 72 hours. Thyroid Function Tests: No results for input(s): TSH, T4TOTAL, FREET4, T3FREE, THYROIDAB in the last 72 hours. Anemia Panel: No results for input(s): VITAMINB12, FOLATE, FERRITIN, TIBC, IRON, RETICCTPCT in the last 72 hours. Sepsis Labs: Recent Labs  Lab 08/26/18 2150 08/27/18 0210 08/27/18 0414  LATICACIDVEN 2.49* 1.8 1.3    Recent Results (from the past 240 hour(s))  Culture, blood (routine x 2)     Status: None (Preliminary result)   Collection Time: 08/26/18  9:46 PM  Result Value Ref Range Status   Specimen Description   Final    BLOOD RIGHT FOREARM Performed at [facility], [place],  [place] [address]    Special Requests   Final    BOTTLES DRAWN AEROBIC AND ANAEROBIC Blood Culture results may not be optimal due to an excessive volume of blood received in culture bottles Performed at [facility] [address], [place], [place] [address]    Culture PENDING  Incomplete   Report Status PENDING  Incomplete         Radiology Studies: Dg Chest 2 View  Result Date: 08/26/2018 CLINICAL DATA:  Chest pain and dyspnea. COPD. EXAM: CHEST - 2 VIEW COMPARISON:  05/08/2018 chest radiograph. FINDINGS: Stable cardiomediastinal silhouette with normal heart size. Asymmetric mild right hilar prominence is stable since 01/16/2017 chest CT angiogram study, where it was seen to be due to vascular shadows. No pneumothorax. No pleural effusion. Hyperinflated lungs. No pulmonary edema. Stable right middle lobe bandlike scarring. No acute consolidative airspace disease. Stable moderate midthoracic vertebral compression fracture since 06/30/2018 thoracic spine radiographs. IMPRESSION: 1. No acute cardiopulmonary disease. 2. Hyperinflated lungs, suggesting COPD. 3. Stable right middle lobe bandlike scarring. Electronically Signed   By: Ilona Sorrel M.D.   On:  08/26/2018 17:48   Ct Angio Chest Pe W/cm &/or Wo Cm  Result Date: 08/26/2018 CLINICAL DATA:  High pretest probability for pulmonary embolism. EXAM: CT ANGIOGRAPHY CHEST WITH CONTRAST TECHNIQUE: Multidetector CT imaging of the chest was performed using the standard protocol during bolus administration of intravenous contrast. Multiplanar CT image reconstructions and MIPs were obtained to evaluate the vascular anatomy. CONTRAST:  173m ISOVUE-370 IOPAMIDOL (ISOVUE-370) INJECTION 76% COMPARISON:  01/16/2017 FINDINGS: Cardiovascular: Normal heart size. No pericardial effusion. No acute pulmonary embolism. Truncated appearance of multiple peripheral pulmonary arteries, likely accentuated by motion. This finding is best demonstrated in the lingula, as demarcated on series 5. Patient has history of pulmonary embolism in 2008 and this is presumably related sequela. Main pulmonary artery is enlarged to 3.5 cm, suggesting pulmonary hypertension. Aortic and coronary atherosclerosis Mediastinum/Nodes: Negative for adenopathy or mass. Lungs/Pleura: The bronchiectasis in the right middle lobe with collapse. Bronchiectasis with mucoid impaction within the anterior basal segment right lower lobe and anterior segment right upper lobe, chronic. Clustered micro nodularity in the posterior right lower lobe, new. Upper Abdomen: Negative Musculoskeletal: Nonacute T8 and T12 compression fractures. T8 height loss is advanced and T12 height loss is mild. Both have occurred since April 2018. The T8 fracture appears chronic and is sclerotic. At T12, fracture lucencies are still visible. Review of the MIP images confirms the above findings. IMPRESSION: 1. Negative for acute pulmonary embolism. 2. 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 Large main pulmonary artery suggesting associated hypertension. 3. Right-sided bronchiectasis and mucoid impaction  with right middle lobe collapse. No progression since 2018. There may be underlying chronic MAC infection. 4. Minimal infectious or inflammatory opacity in the right lower lobe likely related to #3, please correlate for active infectious symptoms. 5. Nonacute compression fractures of T8 and T12 that have occurred since 2018. The T12 fracture is likely subacute. Electronically Signed   By: JMonte FantasiaM.D.   On: 08/26/2018 20:18   Mr Brain Wo Contrast  Result Date: 08/27/2018 CLINICAL DATA:  Possible CVA. LEFT arm weakness. Motion due to cough. EXAM: MRI HEAD WITHOUT CONTRAST TECHNIQUE: Multiplanar, multiecho pulse sequences of the brain and surrounding structures were obtained without intravenous contrast. COMPARISON:  MR head 12/01/2007. FINDINGS: Brain: No acute infarction, hemorrhage, hydrocephalus, extra-axial collection or mass lesion. Generalized atrophy. T2 and  FLAIR hyperintensities throughout the white matter, consistent with moderately advanced small vessel disease. Remote RIGHT frontal ovoid to spherical deep white matter infarct versus chronic neuroglial cyst stable from 2009. Vascular: Flow voids are maintained throughout the carotid, basilar, and vertebral arteries. There are no areas of chronic hemorrhage. Skull and upper cervical spine: Unremarkable visualized calvarium, skullbase, and cervical vertebrae. Pituitary, pineal, cerebellar tonsils unremarkable. No upper cervical cord lesions. Sinuses/Orbits: No orbital masses or proptosis. Globes appear symmetric. Sinuses appear well aerated, without evidence for air-fluid level. Other: No nasopharyngeal pathology or mastoid fluid. Scalp and other visualized extracranial soft tissues grossly unremarkable. When compared with the previous study 10 years ago, chronic microvascular ischemic change has progressed. IMPRESSION: Atrophy with advanced small vessel disease progressive from prior MR. Chronic RIGHT frontal infarct versus neuroglial cyst,  unchanged from 2009. No acute intracranial findings. Electronically Signed   By: Staci Righter M.D.   On: 08/27/2018 18:36        Scheduled Meds: . amLODipine  5 mg Oral Daily  . losartan  100 mg Oral Daily  . mouth rinse  15 mL Mouth Rinse BID  . methylPREDNISolone (SOLU-MEDROL) injection  60 mg Intravenous Q6H  . Warfarin - Pharmacist Dosing Inpatient   Does not apply q1800   Continuous Infusions: . azithromycin    . cefTRIAXone (ROCEPHIN)  IV       LOS: 0 days    Time spent: over 30 min    Fayrene Helper, MD [facility] Pager [phone]  If 7PM-7AM, please contact night-coverage [email or web address] Password [email or web address] 08/27/2018, 7:31 PM

## 2018-08-27 NOTE — ED Notes (Signed)
ED TO INPATIENT HANDOFF REPORT  Name/Age/Gender [name] 84 y.o. female  Code Status    Code Status Orders  (From admission, onward)         Start     Ordered   08/26/18 2201  Full code  Continuous     08/26/18 2202        Code Status History    This patient has a current code status but no historical code status.    Advance Directive Documentation     Most Recent Value  Type of Advance Directive  Healthcare Power of Attorney  Pre-existing out of facility DNR order (yellow form or pink MOST form)  -  "MOST" Form in Place?  -      Home/SNF/Other Home  Chief Complaint SOB chest pain   Level of Care/Admitting Diagnosis ED Disposition    ED Disposition Condition [place]: [place] [[id number]]  Level of Care: Med-Surg [16]  Diagnosis: CAP (community acquired pneumonia) [932671]  Admitting Physician: Vianne Bulls [[id number]]  Attending Physician: Vianne Bulls [[id number]]  PT Class (Do Not Modify): Observation [104]  PT Acc Code (Do Not Modify): Observation [10022]       Medical History Past Medical History:  Diagnosis Date  . ALLERGIC RHINITIS   . ANXIETY   . Asthma   . Bronchiectasis 12/11/2011   CT chest dx  . CAROTID ARTERY STENOSIS, RIGHT   . Chronic idiopathic constipation   . Chronic rhinitis   . COMMON MIGRAINE   . COPD   . CORONARY ARTERY DISEASE   . CYST/PSEUDOCYST, PANCREAS   . DEPRESSION   . DIVERTICULOSIS, COLON   . DIZZINESS, CHRONIC   . Dyspnea    on exertion  . Esophageal stricture   . GERD   . HYPERLIPIDEMIA   . HYPERTENSION   . Internal hemorrhoids with complication   . OSTEOPOROSIS   . OVERACTIVE BLADDER   . Pulmonary embolism ([place]) 2008  . PULMONARY HYPERTENSION, SECONDARY   . Stroke ([place])   . Tubular adenoma of colon 10/2013  . Unspecified hearing loss     Allergies Allergies  Allergen Reactions  . Raloxifene Other (See Comments)    REACTION: risk of recurrent  stroke  . Sulfonamide Derivatives Anaphylaxis    REACTION: tongue swells  . Amoxicillin-Pot Clavulanate Diarrhea    REACTION: diarrhea Has patient had a PCN reaction causing immediate rash, facial/tongue/throat swelling, SOB or lightheadedness with hypotension: unknown Has patient had a PCN reaction causing severe rash involving mucus membranes or skin necrosis: unknown Has patient had a PCN reaction that required hospitalization : unknown Has patient had a PCN reaction occurring within the last 10 years: yes   pt states she should be able to take penicillin, but cant remember actually taking it   . Ciclesonide Other (See Comments)    REACTION: bad smell  . Ciprofloxacin Other (See Comments)    dizziness    IV Location/Drains/Wounds Patient Lines/Drains/Airways Status   Active Line/Drains/Airways    Name:   Placement date:   Placement time:   Site:   Days:   Peripheral IV 08/26/18 Left Antecubital   08/26/18    1935    Antecubital   1   Incision (Closed) 08/20/18 Back Other (Comment)   08/20/18    0935     7          Labs/Imaging Results for orders placed or performed during the hospital encounter of  08/26/18 (from the past 48 hour(s))  Basic metabolic panel     Status: Abnormal   Collection Time: 08/26/18  4:57 PM  Result Value Ref Range   Sodium 127 (L) 135 - 145 mmol/L   Potassium 3.9 3.5 - 5.1 mmol/L   Chloride 88 (L) 98 - 111 mmol/L   CO2 27 22 - 32 mmol/L   Glucose, Bld 135 (H) 70 - 99 mg/dL   BUN 11 8 - 23 mg/dL   Creatinine, Ser 0.52 0.44 - 1.00 mg/dL   Calcium 9.4 8.9 - 10.3 mg/dL   GFR calc non Af Amer >60 >60 mL/min   GFR calc Af Amer >60 >60 mL/min    Comment: (NOTE) The eGFR has been calculated using the CKD EPI equation. This calculation has not been validated in all clinical situations. eGFR's persistently <60 mL/min signify possible Chronic Kidney Disease.    Anion gap 12 5 - 15    Comment: Performed at [facility], [place] [address], [place], [place] [address]  CBC     Status: Abnormal   Collection Time: 08/26/18  4:57 PM  Result Value Ref Range   WBC 11.2 (H) 4.0 - 10.5 K/uL   RBC 4.51 3.87 - 5.11 MIL/uL   Hemoglobin 14.3 12.0 - 15.0 g/dL   HCT 43.2 36.0 - 46.0 %   MCV 95.8 80.0 - 100.0 fL   MCH 31.7 26.0 - 34.0 pg   MCHC 33.1 30.0 - 36.0 g/dL    Comment: CORRECTED FOR COLD AGGLUTININS   RDW 13.3 11.5 - 15.5 %   Platelets 306 150 - 400 K/uL   nRBC 0.0 0.0 - 0.2 %    Comment: Performed at [facility], [place] [address] [place], [place] [address]  POCT i-Stat troponin I     Status: None   Collection Time: 08/26/18  5:02 PM  Result Value Ref Range   Troponin i, poc 0.00 0.00 - 0.08 ng/mL   Comment 3            Comment: Due to the release kinetics of cTnI, a negative result within the first hours of the onset of symptoms does not rule out myocardial infarction with certainty. If myocardial infarction is still suspected, repeat the test at appropriate intervals.   Protime-INR     Status: Abnormal   Collection Time: 08/26/18  8:10 PM  Result Value Ref Range   Prothrombin Time 26.9 (H) 11.4 - 15.2 seconds   INR 2.53     Comment: Performed at [facility], [place] [address] [place], [place] [address]  CG4 I-STAT (Lactic acid)     Status: Abnormal   Collection Time: 08/26/18  9:50 PM  Result Value Ref Range   Lactic Acid, Venous 2.49 (HH) 0.5 - 1.9 mmol/L   Comment NOTIFIED PHYSICIAN    Dg Chest 2 View  Result Date: 08/26/2018 CLINICAL DATA:  Chest pain and dyspnea. COPD. EXAM: CHEST - 2 VIEW COMPARISON:  05/08/2018 chest radiograph. FINDINGS: Stable cardiomediastinal silhouette with normal heart size. Asymmetric mild right hilar prominence is stable since 01/16/2017 chest CT angiogram study, where it was seen to be due to vascular shadows. No pneumothorax. No pleural effusion. Hyperinflated lungs. No pulmonary edema. Stable right middle lobe bandlike scarring. No acute  consolidative airspace disease. Stable moderate midthoracic vertebral compression fracture since 06/30/2018 thoracic spine radiographs. IMPRESSION: 1. No acute cardiopulmonary disease. 2. Hyperinflated lungs, suggesting COPD. 3. Stable right middle lobe bandlike scarring. Electronically  Signed   By: Ilona Sorrel M.D.   On: 08/26/2018 17:48   Ct Angio Chest Pe W/cm &/or Wo Cm  Result Date: 08/26/2018 CLINICAL DATA:  High pretest probability for pulmonary embolism. EXAM: CT ANGIOGRAPHY CHEST WITH CONTRAST TECHNIQUE: Multidetector CT imaging of the chest was performed using the standard protocol during bolus administration of intravenous contrast. Multiplanar CT image reconstructions and MIPs were obtained to evaluate the vascular anatomy. CONTRAST:  125m ISOVUE-370 IOPAMIDOL (ISOVUE-370) INJECTION 76% COMPARISON:  01/16/2017 FINDINGS: Cardiovascular: Normal heart size. No pericardial effusion. No acute pulmonary embolism. Truncated appearance of multiple peripheral pulmonary arteries, likely accentuated by motion. This finding is best demonstrated in the lingula, as demarcated on series 5. Patient has history of pulmonary embolism in 2008 and this is presumably related sequela. Main pulmonary artery is enlarged to 3.5 cm, suggesting pulmonary hypertension. Aortic and coronary atherosclerosis Mediastinum/Nodes: Negative for adenopathy or mass. Lungs/Pleura: The bronchiectasis in the right middle lobe with collapse. Bronchiectasis with mucoid impaction within the anterior basal segment right lower lobe and anterior segment right upper lobe, chronic. Clustered micro nodularity in the posterior right lower lobe, new. Upper Abdomen: Negative Musculoskeletal: Nonacute T8 and T12 compression fractures. T8 height loss is advanced and T12 height loss is mild. Both have occurred since April 2018. The T8 fracture appears chronic and is sclerotic. At T12, fracture lucencies are still visible. Review of the MIP images  confirms the above findings. IMPRESSION: 1. Negative for acute pulmonary embolism. 2. 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 Large main pulmonary artery suggesting associated hypertension. 3. Right-sided bronchiectasis and mucoid impaction with right middle lobe collapse. No progression since 2018. There may be underlying chronic MAC infection. 4. Minimal infectious or inflammatory opacity in the right lower lobe likely related to #3, please correlate for active infectious symptoms. 5. Nonacute compression fractures of T8 and T12 that have occurred since 2018. The T12 fracture is likely subacute. Electronically Signed   By: JMonte FantasiaM.D.   On: 08/26/2018 20:18    Pending Labs Unresulted Labs (From admission, onward)    Start     Ordered   08/27/18 04193 Basic metabolic panel  Tomorrow morning,   R     08/26/18 2202   08/27/18 0500  Protime-INR  Daily,   R     08/26/18 2221   08/26/18 2201  Troponin I - Now Then Q6H  Now then every 6 hours,   R     08/26/18 2202   08/26/18 2200  Culture, sputum-assessment  Once,   R    Question:  Patient immune status  Answer:  Normal   08/26/18 2202   08/26/18 2200  Gram stain  Once,   R    Question:  Patient immune status  Answer:  Normal   08/26/18 2202   08/26/18 2200  Strep pneumoniae urinary antigen  Once,   R     08/26/18 2202   08/26/18 2050  Culture, blood (routine x 2)  BLOOD CULTURE X 2,   STAT,   Status:  Canceled     08/26/18 2049          Vitals/Pain Today's Vitals   08/26/18 2230 08/26/18 2300 08/26/18 2335 08/27/18 0006  BP: (!) 156/76 (!) 160/76 131/67 (!) 129/55  Pulse: 82 86 81 75  Resp: (!) 22 (!) 28 (!) 25 (!) 31  Temp:      TempSrc:  SpO2: 95% 94% 96% 94%  Weight:      Height:      PainSc:        Isolation Precautions No active isolations  Medications Medications  sodium chloride (PF) 0.9 % injection (has no  administration in time range)  iopamidol (ISOVUE-370) 76 % injection (has no administration in time range)  amLODipine (NORVASC) tablet 5 mg (5 mg Oral Given 08/26/18 2314)  losartan (COZAAR) tablet 100 mg (has no administration in time range)  cyclobenzaprine (FLEXERIL) tablet 10 mg (has no administration in time range)  albuterol (PROVENTIL) (2.5 MG/3ML) 0.083% nebulizer solution 2.5 mg (has no administration in time range)  methylPREDNISolone sodium succinate (SOLU-MEDROL) 125 mg/2 mL injection 60 mg (has no administration in time range)  0.9 %  sodium chloride infusion (has no administration in time range)  acetaminophen (TYLENOL) tablet 650 mg (has no administration in time range)    Or  acetaminophen (TYLENOL) suppository 650 mg (has no administration in time range)  HYDROcodone-acetaminophen (NORCO/VICODIN) 5-325 MG per tablet 1 tablet (has no administration in time range)  ondansetron (ZOFRAN) tablet 4 mg (has no administration in time range)    Or  ondansetron (ZOFRAN) injection 4 mg (has no administration in time range)  senna-docusate (Senokot-S) tablet 1 tablet (has no administration in time range)  morphine 2 MG/ML injection 2 mg (has no administration in time range)  cefTRIAXone (ROCEPHIN) 1 g in sodium chloride 0.9 % 100 mL IVPB (has no administration in time range)  azithromycin (ZITHROMAX) 500 mg in sodium chloride 0.9 % 250 mL IVPB (has no administration in time range)  Warfarin - Pharmacist Dosing Inpatient (has no administration in time range)  sodium chloride 0.9 % bolus 500 mL (0 mLs Intravenous Stopped 08/26/18 2208)  albuterol (PROVENTIL) (2.5 MG/3ML) 0.083% nebulizer solution 5 mg (5 mg Nebulization Given 08/26/18 2012)  ipratropium (ATROVENT) nebulizer solution 0.5 mg (0.5 mg Nebulization Given 08/26/18 2012)  methylPREDNISolone sodium succinate (SOLU-MEDROL) 125 mg/2 mL injection 60 mg (60 mg Intravenous Given 08/26/18 2018)  iopamidol (ISOVUE-370) 76 % injection  100 mL (100 mLs Intravenous Contrast Given 08/26/18 1943)  cefTRIAXone (ROCEPHIN) 1 g in sodium chloride 0.9 % 100 mL IVPB (0 g Intravenous Stopped 08/26/18 2244)  azithromycin (ZITHROMAX) 500 mg in sodium chloride 0.9 % 250 mL IVPB (0 mg Intravenous Stopped 08/27/18 0006)  warfarin (COUMADIN) tablet 2.5 mg (2.5 mg Oral Given 08/26/18 2314)    Mobility manual wheelchair

## 2018-08-27 NOTE — Progress Notes (Signed)
[place] for warfarin Indication: Hx VTE  Allergies  Allergen Reactions  . Raloxifene Other (See Comments)    REACTION: risk of recurrent stroke  . Sulfonamide Derivatives Anaphylaxis    REACTION: tongue swells  . Amoxicillin-Pot Clavulanate Diarrhea    REACTION: diarrhea Has patient had a PCN reaction causing immediate rash, facial/tongue/throat swelling, SOB or lightheadedness with hypotension: unknown Has patient had a PCN reaction causing severe rash involving mucus membranes or skin necrosis: unknown Has patient had a PCN reaction that required hospitalization : unknown Has patient had a PCN reaction occurring within the last 10 years: yes   pt states she should be able to take penicillin, but cant remember actually taking it   . Ciclesonide Other (See Comments)    REACTION: bad smell  . Ciprofloxacin Other (See Comments)    dizziness    Patient Measurements: Height: 5\' 5"  (165.1 cm) Weight: 147 lb (66.7 kg) IBW/kg (Calculated) : 57   Vital Signs: Temp: 98 F (36.7 C) (11/20 0540) Temp Source: Oral (11/20 0540) BP: 137/57 (11/20 0829) Pulse Rate: 79 (11/20 0829)  Labs: Recent Labs    08/26/18 1657 08/26/18 2010 08/26/18 2345 08/27/18 0414 08/27/18 0945  HGB 14.3  --   --   --   --   HCT 43.2  --   --   --   --   PLT 306  --   --   --   --   LABPROT  --  26.9*  --  29.4*  --   INR  --  2.53  --  2.84  --   CREATININE 0.52  --   --  0.40*  --   TROPONINI  --   --  <0.03 0.04* 0.04*    Estimated Creatinine Clearance: 45.4 mL/min (A) (by C-G formula based on SCr of 0.4 mg/dL (L)).   Medical History: Past Medical History:  Diagnosis Date  . ALLERGIC RHINITIS   . ANXIETY   . Asthma   . Bronchiectasis 12/11/2011   CT chest dx  . CAROTID ARTERY STENOSIS, RIGHT   . Chronic idiopathic constipation   . Chronic rhinitis   . COMMON MIGRAINE   . COPD   . CORONARY ARTERY DISEASE   . CYST/PSEUDOCYST, PANCREAS   .  DEPRESSION   . DIVERTICULOSIS, COLON   . DIZZINESS, CHRONIC   . Dyspnea    on exertion  . Esophageal stricture   . GERD   . HYPERLIPIDEMIA   . HYPERTENSION   . Internal hemorrhoids with complication   . OSTEOPOROSIS   . OVERACTIVE BLADDER   . Pulmonary embolism ([place]) 2008  . PULMONARY HYPERTENSION, SECONDARY   . Stroke ([place])   . Tubular adenoma of colon 10/2013  . Unspecified hearing loss     Medications:  Medications Prior to Admission  Medication Sig Dispense Refill Last Dose  . acetaminophen (TYLENOL) 500 MG tablet Take 1,000 mg by mouth every 6 (six) hours as needed for moderate pain.   08/25/2018 at Unknown time  . amLODipine (NORVASC) 5 MG tablet TAKE 1 TABLET EVERY DAY 90 tablet 1 08/25/2018 at Unknown time  . budesonide (RHINOCORT ALLERGY) 32 MCG/ACT nasal spray Place 1 spray into both nostrils daily.    08/25/2018 at Unknown time  . cyclobenzaprine (FLEXERIL) 10 MG tablet Take 1 tablet (10 mg total) by mouth 3 (three) times daily as needed for muscle spasms. 40 tablet 1 08/25/2018 at Unknown time  . Digestive Enzymes (PAPAYA  AND ENZYMES) CHEW Chew 2 tablets by mouth 3 (three) times daily with meals as needed (digestion of big meals).    08/25/2018 at Unknown time  . HYDROcodone-acetaminophen (NORCO/VICODIN) 5-325 MG tablet Take 1 tablet by mouth every 6 (six) hours as needed for moderate pain. 30 tablet 0 Past Week at Unknown time  . loratadine (CLARITIN) 10 MG tablet Take 10 mg by mouth daily as needed for allergies.    unknown at Unknown time  . losartan (COZAAR) 100 MG tablet TAKE 1 TABLET EVERY DAY 90 tablet 1 08/25/2018 at Unknown time  . Multiple Vitamins-Minerals (MACUVITE) TABS Take 1 tablet by mouth daily.     08/25/2018 at Unknown time  . Probiotic Product (PROBIOTIC DAILY PO) Take 1 tablet by mouth daily.   Past Month at Unknown time  . VENTOLIN HFA 108 (90 Base) MCG/ACT inhaler INHALE 2 PUFFS INTO THE LUNGS EVERY 6 HOURS AS NEEDED FOR WHEEZING OR SHORTNESS OF  BREATH. (Patient taking differently: Inhale 2 puffs into the lungs every 6 (six) hours as needed. ) 18 Inhaler 0 08/26/2018 at Unknown time  . warfarin (COUMADIN) 5 MG tablet TAKE AS DIRECTED BY ANTICOAGULATION CLINIC (Patient taking differently: Take 2.5-5 mg by mouth daily. 5 mg on Mon and Fri 2.5 mg on Tues, Wed, Thurs, Sat and Sun) 60 tablet 1 08/25/2018 at 2100  . albuterol (ACCUNEB) 0.63 MG/3ML nebulizer solution Take 3 mLs (0.63 mg total) by nebulization every 6 (six) hours as needed for wheezing. 75 mL 12 unknown  . promethazine (PHENERGAN) 25 MG tablet Take 1 tablet (25 mg total) by mouth every 6 (six) hours as needed for nausea or vomiting. 30 tablet 1 unknown  . sertraline (ZOLOFT) 50 MG tablet Take 1 tablet (50 mg total) by mouth daily. (Patient not taking: Reported on 08/26/2018) 90 tablet 3 Not Taking at Unknown time   Scheduled:  . amLODipine  5 mg Oral Daily  . losartan  100 mg Oral Daily  . mouth rinse  15 mL Mouth Rinse BID  . methylPREDNISolone (SOLU-MEDROL) injection  60 mg Intravenous Q6H  . Warfarin - Pharmacist Dosing Inpatient   Does not apply q1800   PRN: acetaminophen **OR** acetaminophen, albuterol, cyclobenzaprine, HYDROcodone-acetaminophen, morphine injection, ondansetron **OR** ondansetron (ZOFRAN) IV, senna-docusate  Assessment: 60 yoF with Hx PE on warfarin admitted for CAP +/- AECOPD. Pharmacy to dose warfarin while admitted   Baseline INR therapeutic  Prior anticoagulation: warfarin 2.5 mg daily except 5 mg on Mon & Fri; last dose 11/18 PM  Significant events:  Today, 08/27/2018:  CBC: WNL  (11/19)  INR therapeutic at 2.84  Major drug interactions: azithromycin may reduce hepatic metabolism of warfarin  No bleeding issues per nursing  Goal of Therapy: INR 2-3  Plan:  Warfarin 2.5 mg PO tonight at 18:00 - would dose conservatively (ie. 2.5 mg daily) while acutely ill until INR trend becomes apparent  Daily INR  CBC at least q72 hr while on  warfarin  Monitor for signs of bleeding or thrombosis   Dolly Rias RPh 08/27/2018, 11:48 AM Pager [phone]

## 2018-08-27 NOTE — Plan of Care (Signed)
Pt is stable. Denies any pain or discomfort at this time. Pt has 2 IV line sites. Left antecubital  site- Sl, Right forearm site- infusing. Care plan discussed with pt.

## 2018-08-27 NOTE — Progress Notes (Signed)
At [place], Florene Glen, MD was notified via text page of the pt's EKG result of sinus rhythm with premature supraventricular complexes.

## 2018-08-28 LAB — COMPREHENSIVE METABOLIC PANEL
ALBUMIN: 3.3 g/dL — AB (ref 3.5–5.0)
ALT: 29 U/L (ref 0–44)
AST: 25 U/L (ref 15–41)
Alkaline Phosphatase: 90 U/L (ref 38–126)
Anion gap: 9 (ref 5–15)
BUN: 16 mg/dL (ref 8–23)
CHLORIDE: 99 mmol/L (ref 98–111)
CO2: 28 mmol/L (ref 22–32)
CREATININE: 0.52 mg/dL (ref 0.44–1.00)
Calcium: 9.2 mg/dL (ref 8.9–10.3)
GFR calc non Af Amer: >60 mL/min (ref >60)
Glucose, Bld: 158 mg/dL — ABNORMAL HIGH (ref 70–99)
Potassium: 3.8 mmol/L (ref 3.5–5.1)
SODIUM: 136 mmol/L (ref 135–145)
Total Bilirubin: 0.5 mg/dL (ref 0.3–1.2)
Total Protein: 6.8 g/dL (ref 6.5–8.1)

## 2018-08-28 LAB — CBC
HCT: 39.9 % (ref 36.0–46.0)
Hemoglobin: 13.1 g/dL (ref 12.0–15.0)
MCH: 31.6 pg (ref 26.0–34.0)
MCHC: 32.8 g/dL (ref 30.0–36.0)
MCV: 96.4 fL (ref 80.0–100.0)
NRBC: 0 % (ref 0.0–0.2)
PLATELETS: 281 10*3/uL (ref 150–400)
RBC: 4.14 MIL/uL (ref 3.87–5.11)
RDW: 13.5 % (ref 11.5–15.5)
WBC: 16.3 10*3/uL — AB (ref 4.0–10.5)

## 2018-08-28 LAB — PROTIME-INR
INR: 3.93
PROTHROMBIN TIME: 37.9 s — AB (ref 11.4–15.2)

## 2018-08-28 LAB — GLUCOSE, CAPILLARY: GLUCOSE-CAPILLARY: 150 mg/dL — AB (ref 70–99)

## 2018-08-28 LAB — MAGNESIUM: Magnesium: 2.1 mg/dL (ref 1.7–2.4)

## 2018-08-28 MED ORDER — CEFDINIR 300 MG PO CAPS: 300.0000 mg | ORAL_CAPSULE | Freq: Two times a day (BID) | ORAL | 0 refills | Status: AC

## 2018-08-28 MED ORDER — AZITHROMYCIN 500 MG PO TABS: 500.0000 mg | ORAL_TABLET | Freq: Every day | ORAL | 0 refills | Status: AC

## 2018-08-28 MED ORDER — PREDNISONE 10 MG PO TABS: 40.0000 mg | ORAL_TABLET | Freq: Every day | ORAL | 0 refills | Status: AC

## 2018-08-28 NOTE — Discharge Summary (Signed)
Physician Discharge Summary  [name] PJK:[id number] DOB: 03-18-1930 DOA: 08/26/2018  PCP: Tabitha Borg, MD  Admit date: 08/26/2018 Discharge date: 08/28/2018  Time spent: 35 minutes  Recommendations for Outpatient Follow-up:  1. Follow up outpatient CBC/CMP 2. Follow up with Dr. Lamonte Aguirre as outpatient for COPD/bronchiectasis and possible chronic MAC infection 3. Follow up pulmonary hypertension as outpatient 4. Follow up compression fx (T8 and T12, T12 is new since September) as outpatient 5. 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 Supratherapeutic INR, instructed to hold warfarin x2 days, then resume home regimen (discussed with pharmacy).  Instructed to follow up with Dr. Jenny Aguirre as soon as possible for INR check.    Discharge Diagnoses:  Principal Problem:   CAP (community acquired pneumonia) Active Problems:   COPD with acute exacerbation ([place])   History of pulmonary embolism   Hyponatremia   History of vertebral compression fracture   Discharge Condition: stable  Diet recommendation: heart healthy   Filed Weights   08/26/18 1633  Weight: 66.7 kg    History of present illness:  [name] a 84 y.o.femalewith medical history significant forchronic bronchitis, hypertension, history of PE on Coumadin, vertebral compression fractures status post recent kyphoplasty, and hyponatremia, now presenting to the emergency department for evaluation of productive cough and chest pain. Patient reports developing increase in her chronic dyspnea with a new cough productive of thick dark sputum over the past few days. She developed chest pain, worse with breathing, last night. Pain resolved within a couple hours, but returned today, more severe and persistent now. Patient denies fevers or chills. She denies swelling or tenderness in the lower  extremities.  She was admitted for treatment of pneumonia.  She improved on CAP therapy.  She had been weaned to room air on the day of discharge and required 2 L when working with PT (she uses 2 L at home with activity at baseline).    Hospital Course:  1.CAP -Presents with productive cough and pleuritic chest pain, found to have leukocytosis and CTA neg for PE but concerning for right-sided PNA -follow blood cx NGTD x1  - CAP -> ceftriaxone/azithromycin -> transitioned to cefdinir/azithromycin at discharge  -Pt with bronchiectasis and mucoid impaction with RML collapse, ? Of underlying chronic MAC infection.  Message sent to Dr. Lamonte Aguirre to help pt schedule appointment.  Discussed with pt and daughter recommendation to follow up with Dr. Lamonte Aguirre as outpatient.   # LUE Weakness:  MRI with atrophy with advanced small vessel disease progressive from prior MR and chronic R frontal infarct vs neuroglial cyst unchanged from 2009.  Unable to pinpoint timing of the weakness, but it seemed improved today and she noted this as well.  No neck pain or upper back pain, no numbness, or parethesias.  Continue working with PT, with improvement in sx I don't think any additional imaging needed at this point in time.   2.COPD with acute exacerbation -Likely precipitated by infection - Plan for 5 day course of steroids - Continue scheduled and prn nebs and abx  3.History of PE -No acute PE on CTA -INR is therapeutic, continue warfarin  4.Chest pain -Pleuritic in nature, likely secondary to PNA as CTA neg for PE - EKG without ischemic changes - troponin mildly elevated, but flat and as pt without EKG findings concerning for ischemia and with pleuritic CP, suspect this is related  to pneumonia  5.Hyponatremia -improved, continue to follow, mild -Pt appears hypovolemic  Compression Fx of T8 and T12: non acute, T12 appeared subacute based on imaging, continue to  monitor  Leukocytosis: 2/2 steroids  Procedures: none   Consultations: none  Discharge Exam: Vitals:   08/28/18 0851 08/28/18 1026  BP:    Pulse:  (!) 122  Resp:    Temp:    SpO2: 90% 90%   Feeling better today Shortness of breath is improved LUE weakness is improved  General: No acute distress. Cardiovascular: Heart sounds show a regular rate, and rhythm Lungs: Clear to auscultation bilaterally Abdomen: Soft, nontender, nondistended  Neurological: Alert and oriented 3. CN 2-12 intact.  5/5 strength to lower extremities.  5/5 strength to RUE.  LUE strength seems improved today, very close if slightly less strength than RUE.  Skin: Warm and dry. No rashes or lesions. Extremities: No clubbing or cyanosis. No edema.  Psychiatric: Mood and affect are normal. Insight and judgment are appropriate.  Discharge Instructions   Discharge Instructions    Call MD for:  difficulty breathing, headache or visual disturbances   Complete by:  As directed    Call MD for:  persistant dizziness or light-headedness   Complete by:  As directed    Call MD for:  persistant nausea and vomiting   Complete by:  As directed    Call MD for:  redness, tenderness, or signs of infection (pain, swelling, redness, odor or green/yellow discharge around incision site)   Complete by:  As directed    Call MD for:  severe uncontrolled pain   Complete by:  As directed    Call MD for:  temperature >100.4   Complete by:  As directed    Diet - low sodium heart healthy   Complete by:  As directed    Discharge instructions   Complete by:  As directed    You were seen for pneumonia.  You've improved with antibiotics.  We'll discharge you with 3 additional days of antibiotics (start tonight) and 2 more days of steroids (start tomorrow).  Please follow up with Dr. Lamonte Aguirre as an outpatient for your COPD and to follow up your bronchiectasis and possible chronic infection.  Please follow up regarding your  pulmonary hypertension with your outpatient providers.  Your INR was high on the day of discharge, please hold your warfarin for 2 days and then resume at your home regimen.  Please follow up with Dr. Jenny Aguirre early next week for a repeat INR check within a few days (Monday if possible).  You have a T8 fracture and T12 fracture (which seems to be newer), please follow up with Dr. Christella Noa regarding this.  I'm not sure what caused your left upper extremity weakness, but you had a MRI with chronic findings stable since 2009 (chronic stroke vs neuroglial cyst).  Please follow up with your PCP regarding this and with PT as well, but it seems to be improved at this time.  Return for new, recurrent, or worsening symptoms.  Please ask your PCP to request records from this hospitalization so they know what was done and what the next steps will be.   Increase activity slowly   Complete by:  As directed      Allergies as of 08/28/2018      Reactions   Raloxifene Other (See Comments)   REACTION: risk of recurrent stroke   Sulfonamide Derivatives Anaphylaxis   REACTION: tongue swells   Amoxicillin-pot Clavulanate Diarrhea  REACTION: diarrhea Has patient had a PCN reaction causing immediate rash, facial/tongue/throat swelling, SOB or lightheadedness with hypotension: unknown Has patient had a PCN reaction causing severe rash involving mucus membranes or skin necrosis: unknown Has patient had a PCN reaction that required hospitalization : unknown Has patient had a PCN reaction occurring within the last 10 years: yes  pt states she should be able to take penicillin, but cant remember actually taking it    Ciclesonide Other (See Comments)   REACTION: bad smell   Ciprofloxacin Other (See Comments)   dizziness      Medication List    TAKE these medications   acetaminophen 500 MG tablet Commonly known as:  TYLENOL Take 1,000 mg by mouth every 6 (six) hours as needed for moderate pain.   albuterol  0.63 MG/3ML nebulizer solution Commonly known as:  ACCUNEB Take 3 mLs (0.63 mg total) by nebulization every 6 (six) hours as needed for wheezing. What changed:  Another medication with the same name was changed. Make sure you understand how and when to take each.   VENTOLIN HFA 108 (90 Base) MCG/ACT inhaler Generic drug:  albuterol INHALE 2 PUFFS INTO THE LUNGS EVERY 6 HOURS AS NEEDED FOR WHEEZING OR SHORTNESS OF BREATH. What changed:  See the new instructions.   amLODipine 5 MG tablet Commonly known as:  NORVASC TAKE 1 TABLET EVERY DAY   azithromycin 500 MG tablet Commonly known as:  ZITHROMAX Take 1 tablet (500 mg total) by mouth daily for 3 days. Take 1 tablet daily for 3 days. (start tonight, 11/21 PM)   cefdinir 300 MG capsule Commonly known as:  OMNICEF Take 1 capsule (300 mg total) by mouth 2 (two) times daily for 3 days. (start tonight, 11/21 PM)   cyclobenzaprine 10 MG tablet Commonly known as:  FLEXERIL Take 1 tablet (10 mg total) by mouth 3 (three) times daily as needed for muscle spasms.   HYDROcodone-acetaminophen 5-325 MG tablet Commonly known as:  NORCO/VICODIN Take 1 tablet by mouth every 6 (six) hours as needed for moderate pain.   loratadine 10 MG tablet Commonly known as:  CLARITIN Take 10 mg by mouth daily as needed for allergies.   losartan 100 MG tablet Commonly known as:  COZAAR TAKE 1 TABLET EVERY DAY   MACUVITE Tabs Take 1 tablet by mouth daily.   PAPAYA AND ENZYMES Chew Chew 2 tablets by mouth 3 (three) times daily with meals as needed (digestion of big meals).   predniSONE 10 MG tablet Commonly known as:  DELTASONE Take 4 tablets (40 mg total) by mouth daily for 2 days. (start tomorrow, 11/22)   PROBIOTIC DAILY PO Take 1 tablet by mouth daily.   promethazine 25 MG tablet Commonly known as:  PHENERGAN Take 1 tablet (25 mg total) by mouth every 6 (six) hours as needed for nausea or vomiting.   RHINOCORT ALLERGY 32 MCG/ACT nasal  spray Generic drug:  budesonide Place 1 spray into both nostrils daily.   sertraline 50 MG tablet Commonly known as:  ZOLOFT Take 1 tablet (50 mg total) by mouth daily.   warfarin 5 MG tablet Commonly known as:  COUMADIN Take as directed. If you are unsure how to take this medication, talk to your nurse or doctor. Original instructions:  TAKE AS DIRECTED BY ANTICOAGULATION CLINIC What changed:    how much to take  how to take this  when to take this  additional instructions  Durable Medical Equipment  (From admission, onward)         Start     Ordered   08/28/18 1050  For home use only DME 4 wheeled rolling walker with seat  (Walkers)  Once    Question:  Patient needs a walker to treat with the following condition  Answer:  Physical deconditioning   08/28/18 1054   08/28/18 1050  DME 3-in-1  Once     08/28/18 1054         Allergies  Allergen Reactions  . Raloxifene Other (See Comments)    REACTION: risk of recurrent stroke  . Sulfonamide Derivatives Anaphylaxis    REACTION: tongue swells  . Amoxicillin-Pot Clavulanate Diarrhea    REACTION: diarrhea Has patient had a PCN reaction causing immediate rash, facial/tongue/throat swelling, SOB or lightheadedness with hypotension: unknown Has patient had a PCN reaction causing severe rash involving mucus membranes or skin necrosis: unknown Has patient had a PCN reaction that required hospitalization : unknown Has patient had a PCN reaction occurring within the last 10 years: yes   pt states she should be able to take penicillin, but cant remember actually taking it   . Ciclesonide Other (See Comments)    REACTION: bad smell  . Ciprofloxacin Other (See Comments)    dizziness      The results of significant diagnostics from this hospitalization (including imaging, microbiology, ancillary and laboratory) are listed below for reference.    Significant Diagnostic Studies: Dg Chest 2 View  Result Date:  08/26/2018 CLINICAL DATA:  Chest pain and dyspnea. COPD. EXAM: CHEST - 2 VIEW COMPARISON:  05/08/2018 chest radiograph. FINDINGS: Stable cardiomediastinal silhouette with normal heart size. Asymmetric mild right hilar prominence is stable since 01/16/2017 chest CT angiogram study, where it was seen to be due to vascular shadows. No pneumothorax. No pleural effusion. Hyperinflated lungs. No pulmonary edema. Stable right middle lobe bandlike scarring. No acute consolidative airspace disease. Stable moderate midthoracic vertebral compression fracture since 06/30/2018 thoracic spine radiographs. IMPRESSION: 1. No acute cardiopulmonary disease. 2. Hyperinflated lungs, suggesting COPD. 3. Stable right middle lobe bandlike scarring. Electronically Signed   By: Ilona Sorrel M.D.   On: 08/26/2018 17:48   Dg Lumbar Spine 2-3 Views  Result Date: 08/20/2018 CLINICAL DATA:  Kyphoplasty L3 EXAM: DG C-ARM 61-120 MIN; LUMBAR SPINE - 2-3 VIEW COMPARISON:  Lumbar MRI 08/06/2018 FINDINGS: AP and lateral C-arm images were obtained. Kyphoplasty of L3. Cement is present in the superior vertebral body bilaterally with good distribution. IMPRESSION: Kyphoplasty for L3 superior endplate fracture. Electronically Signed   By: Franchot Gallo M.D.   On: 08/20/2018 10:08   Ct Angio Chest Pe W/cm &/or Wo Cm  Result Date: 08/26/2018 CLINICAL DATA:  High pretest probability for pulmonary embolism. EXAM: CT ANGIOGRAPHY CHEST WITH CONTRAST TECHNIQUE: Multidetector CT imaging of the chest was performed using the standard protocol during bolus administration of intravenous contrast. Multiplanar CT image reconstructions and MIPs were obtained to evaluate the vascular anatomy. CONTRAST:  166m ISOVUE-370 IOPAMIDOL (ISOVUE-370) INJECTION 76% COMPARISON:  01/16/2017 FINDINGS: Cardiovascular: Normal heart size. No pericardial effusion. No acute pulmonary embolism. Truncated appearance of multiple peripheral pulmonary arteries, likely accentuated  by motion. This finding is best demonstrated in the lingula, as demarcated on series 5. Patient has history of pulmonary embolism in 2008 and this is presumably related sequela. Main pulmonary artery is enlarged to 3.5 cm, suggesting pulmonary hypertension. Aortic and coronary atherosclerosis Mediastinum/Nodes: Negative for adenopathy or mass. Lungs/Pleura: The bronchiectasis in the  right middle lobe with collapse. Bronchiectasis with mucoid impaction within the anterior basal segment right lower lobe and anterior segment right upper lobe, chronic. Clustered micro nodularity in the posterior right lower lobe, new. Upper Abdomen: Negative Musculoskeletal: Nonacute T8 and T12 compression fractures. T8 height loss is advanced and T12 height loss is mild. Both have occurred since April 2018. The T8 fracture appears chronic and is sclerotic. At T12, fracture lucencies are still visible. Review of the MIP images confirms the above findings. IMPRESSION: 1. Negative for acute pulmonary embolism. 2. 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 Large main pulmonary artery suggesting associated hypertension. 3. Right-sided bronchiectasis and mucoid impaction with right middle lobe collapse. No progression since 2018. There may be underlying chronic MAC infection. 4. Minimal infectious or inflammatory opacity in the right lower lobe likely related to #3, please correlate for active infectious symptoms. 5. Nonacute compression fractures of T8 and T12 that have occurred since 2018. The T12 fracture is likely subacute. Electronically Signed   By: Monte Fantasia M.D.   On: 08/26/2018 20:18   Ct Lumbar Spine Wo Contrast  Result Date: 07/30/2018 CLINICAL DATA:  84 year old female with recent T8 compression fracture documented on September thoracic spine MR. New sharp low back pain after bending 3 days ago. Pain is severe. EXAM: CT LUMBAR SPINE  WITHOUT CONTRAST TECHNIQUE: Multidetector CT imaging of the lumbar spine was performed without intravenous contrast administration. Multiplanar CT image reconstructions were also generated. COMPARISON:  Thoracic spine radiographs 923 and thoracic spine MRI 06/17/2018. FINDINGS: Segmentation: Lumbar segmentation appears to be normal and will be designated as such for this report. Alignment: Relatively preserved lumbar lordosis. Slight dextroconvex lumbar curvature. Vertebrae: Diffuse osteopenia. There is mild compression of both the L3 and L4 vertebral bodies with 20-30% loss of height at each level. The L3 superior endplate is compressed and mildly sclerotic. There is minor retropulsion of the posterosuperior L3 endplate. The pedicles and posterior elements appear intact. The L4 inferior endplate is compressed, and this level has a more chronic appearance. No significant retropulsion. The L4 pedicles and posterior elements appear intact. The other lumbar levels appear intact. Visible sacrum and SI joints appear intact. Paraspinal and other soft tissues: Aortoiliac calcified atherosclerosis. Vascular patency is not evaluated in the absence of IV contrast. Diverticulosis of the sigmoid colon. Otherwise negative visible noncontrast abdominal viscera. Negative visualized posterior paraspinal soft tissues. Disc levels: Lumbar spine degeneration notable for: L2-L3: Mild to moderate spinal stenosis related to disc bulge, mild bony retropulsion, and posterior element hypertrophy. L3-L4: Moderate appearing spinal stenosis related to disc bulge and posterior element hypertrophy. L4-L5: Moderate appearing spinal stenosis related to disc bulge and posterior element hypertrophy. L5-S1: Severe disc space loss with vacuum disc and partially calcified right paracentral and caudal disc extrusion. The right lateral recess appears most affected with otherwise mild spinal stenosis. IMPRESSION: 1. Mild compression fractures of L3 and L4  are age indeterminate, but of these the L3 level appears more recent. If specific therapy is desired, Lumbar MRI without contrast or Nuclear Medicine Whole-body Bone Scan would confirm candidacy for vertebroplasty. 2. Underlying osteopenia, multilevel degenerative lumbar spinal stenosis. Electronically Signed   By: Genevie Ann M.D.   On: 07/30/2018 17:05   Mr Brain Wo Contrast  Result Date: 08/27/2018 CLINICAL DATA:  Possible CVA. LEFT arm weakness. Motion due to cough. EXAM: MRI HEAD WITHOUT CONTRAST TECHNIQUE: Multiplanar, multiecho pulse sequences of the brain and surrounding structures  were obtained without intravenous contrast. COMPARISON:  MR head 12/01/2007. FINDINGS: Brain: No acute infarction, hemorrhage, hydrocephalus, extra-axial collection or mass lesion. Generalized atrophy. T2 and FLAIR hyperintensities throughout the white matter, consistent with moderately advanced small vessel disease. Remote RIGHT frontal ovoid to spherical deep white matter infarct versus chronic neuroglial cyst stable from 2009. Vascular: Flow voids are maintained throughout the carotid, basilar, and vertebral arteries. There are no areas of chronic hemorrhage. Skull and upper cervical spine: Unremarkable visualized calvarium, skullbase, and cervical vertebrae. Pituitary, pineal, cerebellar tonsils unremarkable. No upper cervical cord lesions. Sinuses/Orbits: No orbital masses or proptosis. Globes appear symmetric. Sinuses appear well aerated, without evidence for air-fluid level. Other: No nasopharyngeal pathology or mastoid fluid. Scalp and other visualized extracranial soft tissues grossly unremarkable. When compared with the previous study 10 years ago, chronic microvascular ischemic change has progressed. IMPRESSION: Atrophy with advanced small vessel disease progressive from prior MR. Chronic RIGHT frontal infarct versus neuroglial cyst, unchanged from 2009. No acute intracranial findings. Electronically Signed   By: Staci Righter M.D.   On: 08/27/2018 18:36   Dg C-arm 1-60 Min  Result Date: 08/20/2018 CLINICAL DATA:  Kyphoplasty L3 EXAM: DG C-ARM 61-120 MIN; LUMBAR SPINE - 2-3 VIEW COMPARISON:  Lumbar MRI 08/06/2018 FINDINGS: AP and lateral C-arm images were obtained. Kyphoplasty of L3. Cement is present in the superior vertebral body bilaterally with good distribution. IMPRESSION: Kyphoplasty for L3 superior endplate fracture. Electronically Signed   By: Franchot Gallo M.D.   On: 08/20/2018 10:08    Microbiology: Recent Results (from the past 240 hour(s))  Culture, blood (routine x 2)     Status: None (Preliminary result)   Collection Time: 08/26/18  9:46 PM  Result Value Ref Range Status   Specimen Description   Final    BLOOD RIGHT FOREARM Performed at [facility], [address] [place], [place] [address]    Special Requests   Final    BOTTLES DRAWN AEROBIC AND ANAEROBIC Blood Culture results may not be optimal due to an excessive volume of blood received in culture bottles Performed at [facility] [address], [place], [place] [address]    Culture PENDING  Incomplete   Report Status PENDING  Incomplete     Labs: Basic Metabolic Panel: Recent Labs  Lab 08/26/18 1657 08/27/18 0414 08/28/18 0537  NA 127* 130* 136  K 3.9 3.5 3.8  CL 88* 96* 99  CO2 27 24 28   GLUCOSE 135* 158* 158*  BUN 11 8 16   CREATININE 0.52 0.40* 0.52  CALCIUM 9.4 8.6* 9.2  MG  --   --  2.1   Liver Function Tests: Recent Labs  Lab 08/28/18 0537  AST 25  ALT 29  ALKPHOS 90  BILITOT 0.5  PROT 6.8  ALBUMIN 3.3*   No results for input(s): LIPASE, AMYLASE in the last 168 hours. No results for input(s): AMMONIA in the last 168 hours. CBC: Recent Labs  Lab 08/26/18 1657 08/28/18 0537  WBC 11.2* 16.3*  HGB 14.3 13.1  HCT 43.2 39.9  MCV 95.8 96.4  PLT 306 281   Cardiac Enzymes: Recent Labs  Lab 08/26/18 2345 08/27/18 0414 08/27/18 0945  TROPONINI <0.03 0.04* 0.04*    BNP: BNP (last 3 results) No results for input(s): BNP in the last 8760 hours.  ProBNP (last 3 results) No results for input(s): PROBNP in the last 8760 hours.  CBG: Recent Labs  Lab 08/27/18 1120 08/28/18 0800  GLUCAP 166* 150*  Signed:  Fayrene Helper MD.  [facility] 08/28/2018, 11:25 AM

## 2018-08-28 NOTE — Progress Notes (Signed)
Pt and pt's Daughter was provided with d/c instructions. After discussing the pt's plan of care upon d/c home, the pt and pt's Daughter reported no further questions or concerns. The pt received a 3-in-1 BSC and walker prior to d/c.

## 2018-08-28 NOTE — Care Management Note (Signed)
Case Management Note  Patient Details  Name: [name] MRN: [id number] Date of Birth: 1930/06/22  Subjective/Objective:       Discharge planning, spoke with patient and daughter at beside. Chose [facility] for [facility] services, nurse and PT to eval and treat.             Action/Plan: Contacted [facility] for referral. Requesting a rollator, contacted [facility] to deliver to the room. [phone]   Expected Discharge Date:  08/28/18               Expected Discharge Plan:  [place]  In-House Referral:  NA  Discharge planning Services  CM Consult  Post Acute Care Choice:  Home Health, Durable Medical Equipment Choice offered to:  Patient, Adult Children  DME Arranged:  Walker rolling with seat DME Agency:  [place] Arranged:  RN, PT [facility] Agency:  [place]  Status of Service:  Completed, signed off  If discussed at [place] of Stay Meetings, dates discussed:    Additional Comments:  Guadalupe Maple, RN 08/28/2018, 11:38 AM

## 2018-08-28 NOTE — Evaluation (Addendum)
Physical Therapy Evaluation Patient Details Name: [name] MRN: [id number] DOB: [birth year] Today's Date: 08/28/2018   History of Present Illness   84 y.o. female with medical history significant for chronic bronchitis, hypertension, history of PE on Coumadin, vertebral compression fractures status post recent kyphoplasty, and hyponatremia, now presenting to the emergency department for evaluation of productive cough and chest pain.   Clinical Impression  Pt admitted with above diagnosis. Pt currently with functional limitations due to the deficits listed below (see PT Problem List). Pt ambulated 150' with RW, then 47' with rollator for trial of the 2 assistive devices. SaO2 90% on 2L O2 [place] while walking, HR 122 max, 2/4 dyspnea. Pt lives alone but stated she can DC to her daughter's home if she needs assistance. I encouraged pt to ambulate in halls with assistance for O2 tank TID to minimize deconditioning.  Pt will benefit from skilled PT to increase their independence and safety with mobility to allow discharge to the venue listed below.       Follow Up Recommendations Home health PT    Equipment Recommendations  Other (comment)(rollator-4 wheeled RW)   Recommendations for Other Services       Precautions / Restrictions Precautions Precautions: Fall Precaution Comments: pt/daughter report no falls in past 1 year Required Braces or Orthoses: Spinal Brace Spinal Brace: Thoracolumbosacral orthotic(pt has had TLSO for ~6 weeks per pt, for spontaneous compression fxs T8 & T12) Restrictions Weight Bearing Restrictions: No      Mobility  Bed Mobility               General bed mobility comments: up in recliner  Transfers Overall transfer level: Needs assistance Equipment used: Rolling walker (2 wheeled) Transfers: Sit to/from Stand Sit to Stand: Min guard         General transfer comment: VCs for hand placement, increased time 2* back  pain  Ambulation/Gait Ambulation/Gait assistance: Supervision Gait Distance (Feet): 150 Feet Assistive device: Rolling walker (2 wheeled);4-wheeled walker Gait Pattern/deviations: Step-through pattern Gait velocity: WFL   General Gait Details: trial of RW and rollator, pt preferred RW for improved steadiness but could see usefulness of both (will plan to order rollator per daughter's request, daughter will purchase a RW privately), pt ambulated with 2L O2 , SaO2 90%, HR 122 max while walking, 2/4 dyspnea  Stairs            Wheelchair Mobility    Modified Rankin (Stroke Patients Only)       Balance Overall balance assessment: Modified Independent                                           Pertinent Vitals/Pain Pain Assessment: 0-10 Pain Score: 6  Pain Location: back with sit to stand Pain Descriptors / Indicators: Sharp Pain Intervention(s): Limited activity within patient's tolerance;Monitored during session;Premedicated before session    Home Living Family/patient expects to be discharged to:: Private residence Living Arrangements: Alone Available Help at Discharge: Family;Available PRN/intermittently   Home Access: Level entry     Home Layout: One level Home Equipment: Cane - quad; elevated commode seat      Prior Function Level of Independence: Independent with assistive device(s)         Comments: walked with SBQC, no falls, uses 2L O2 during the day only, independent ADLs, daughter does major shopping, pt can DC to daughter's home  Hand Dominance        Extremity/Trunk Assessment   Upper Extremity Assessment Upper Extremity Assessment: Overall WFL for tasks assessed    Lower Extremity Assessment Lower Extremity Assessment: Overall WFL for tasks assessed    Cervical / Trunk Assessment Cervical / Trunk Assessment: Kyphotic  Communication   Communication: HOH  Cognition Arousal/Alertness: Awake/alert Behavior During  Therapy: WFL for tasks assessed/performed Overall Cognitive Status: Within Functional Limits for tasks assessed                                        General Comments      Exercises     Assessment/Plan    PT Assessment Patient needs continued PT services  PT Problem List Decreased mobility;Decreased activity tolerance;Cardiopulmonary status limiting activity;Pain       PT Treatment Interventions Gait training;DME instruction;Therapeutic activities;Therapeutic exercise;Patient/family education;Functional mobility training    PT Goals (Current goals can be found in the Care Plan section)  Acute Rehab PT Goals Patient Stated Goal: increase activity level ("I like to walk") PT Goal Formulation: With patient/family Time For Goal Achievement: 09/11/18 Potential to Achieve Goals: Good    Frequency Min 3X/week   Barriers to discharge        Co-evaluation               AM-PAC PT "6 Clicks" Daily Activity  Outcome Measure Difficulty turning over in bed (including adjusting bedclothes, sheets and blankets)?: A Little Difficulty moving from lying on back to sitting on the side of the bed? : A Little Difficulty sitting down on and standing up from a chair with arms (e.g., wheelchair, bedside commode, etc,.)?: A Little Help needed moving to and from a bed to chair (including a wheelchair)?: A Little Help needed walking in hospital room?: A Little Help needed climbing 3-5 steps with a railing? : A Lot 6 Click Score: 17    End of Session Equipment Utilized During Treatment: Back brace;Oxygen;Gait belt Activity Tolerance: Patient tolerated treatment well Patient left: in chair;with call bell/phone within reach;with family/visitor present Nurse Communication: Mobility status PT Visit Diagnosis: Difficulty in walking, not elsewhere classified (R26.2);Pain    Time: 6147-0929 PT Time Calculation (min) (ACUTE ONLY): 42 min   Charges:   PT Evaluation $PT Eval  Low Complexity: 1 Low PT Treatments $Gait Training: 8-22 mins $Therapeutic Activity: 8-22 mins        Blondell Reveal Kistler PT 08/28/2018  Acute Rehabilitation Services Pager [phone] Office [phone]

## 2018-08-29 ENCOUNTER — Telehealth: Payer: Self-pay | Admitting: *Deleted

## 2018-08-29 NOTE — Telephone Encounter (Signed)
Transition Care Management Follow-up Telephone Call   Date discharged? 08/28/18   How have you been since you were released from the hospital? Pt states she is doing alright   Do you understand why you were in the hospital? YES   Do you understand the discharge instructions? YES   Where were you discharged to? Home   Items Reviewed:  Medications reviewed: YES  Allergies reviewed: YES  Dietary changes reviewed: YES  Referrals reviewed: No referral needed   Functional Questionnaire:   Activities of Daily Living (ADLs):   She states she are independent in the following: bathing and hygiene, feeding, continence, grooming, toileting and dressing States they require assistance with the following: ambulation   Any transportation issues/concerns?: YES   Any patient concerns? NO   Confirmed importance and date/time of follow-up visits scheduled YES, appt 09/01/18  Provider Appointment booked with Dr. [name]   Confirmed with patient if condition begins to worsen call PCP or go to the ER.  Patient was given the office number and encouraged to call back with question or concerns.  : YES

## 2018-09-01 ENCOUNTER — Telehealth: Payer: Self-pay | Admitting: *Deleted

## 2018-09-01 ENCOUNTER — Other Ambulatory Visit (INDEPENDENT_AMBULATORY_CARE_PROVIDER_SITE_OTHER): Payer: Medicare Other

## 2018-09-01 ENCOUNTER — Encounter: Payer: Self-pay | Admitting: Internal Medicine

## 2018-09-01 ENCOUNTER — Ambulatory Visit (INDEPENDENT_AMBULATORY_CARE_PROVIDER_SITE_OTHER): Payer: Medicare Other | Admitting: Internal Medicine

## 2018-09-01 VITALS — BP 142/84 | HR 87 | Temp 97.7°F | Ht 65.0 in | Wt 140.0 lb

## 2018-09-01 DIAGNOSIS — J181 Lobar pneumonia, unspecified organism: Secondary | ICD-10-CM

## 2018-09-01 DIAGNOSIS — R609 Edema, unspecified: Secondary | ICD-10-CM | POA: Diagnosis not present

## 2018-09-01 DIAGNOSIS — R269 Unspecified abnormalities of gait and mobility: Secondary | ICD-10-CM | POA: Diagnosis not present

## 2018-09-01 DIAGNOSIS — R791 Abnormal coagulation profile: Secondary | ICD-10-CM

## 2018-09-01 DIAGNOSIS — J449 Chronic obstructive pulmonary disease, unspecified: Secondary | ICD-10-CM | POA: Diagnosis not present

## 2018-09-01 DIAGNOSIS — S22080D Wedge compression fracture of T11-T12 vertebra, subsequent encounter for fracture with routine healing: Secondary | ICD-10-CM | POA: Insufficient documentation

## 2018-09-01 DIAGNOSIS — J189 Pneumonia, unspecified organism: Secondary | ICD-10-CM

## 2018-09-01 LAB — CBC WITH DIFFERENTIAL/PLATELET
Basophils Absolute: 0 10*3/uL (ref 0.0–0.1)
Basophils Relative: 0.1 % (ref 0.0–3.0)
EOS ABS: 0 10*3/uL (ref 0.0–0.7)
Eosinophils Relative: 0.1 % (ref 0.0–5.0)
HCT: 45.2 % (ref 36.0–46.0)
HEMOGLOBIN: 15.1 g/dL — AB (ref 12.0–15.0)
Lymphocytes Relative: 14.5 % (ref 12.0–46.0)
Lymphs Abs: 2 10*3/uL (ref 0.7–4.0)
MCHC: 33.3 g/dL (ref 30.0–36.0)
MCV: 94.5 fl (ref 78.0–100.0)
Monocytes Absolute: 1.2 10*3/uL — ABNORMAL HIGH (ref 0.1–1.0)
Monocytes Relative: 8.8 % (ref 3.0–12.0)
Neutro Abs: 10.6 10*3/uL — ABNORMAL HIGH (ref 1.4–7.7)
Neutrophils Relative %: 76.5 % (ref 43.0–77.0)
Platelets: 308 10*3/uL (ref 150.0–400.0)
RBC: 4.79 Mil/uL (ref 3.87–5.11)
RDW: 13.7 % (ref 11.5–15.5)
WBC: 13.9 10*3/uL — AB (ref 4.0–10.5)

## 2018-09-01 LAB — BASIC METABOLIC PANEL
BUN: 19 mg/dL (ref 6–23)
CO2: 30 mEq/L (ref 19–32)
Calcium: 9.7 mg/dL (ref 8.4–10.5)
Chloride: 91 mEq/L — ABNORMAL LOW (ref 96–112)
Creatinine, Ser: 0.54 mg/dL (ref 0.40–1.20)
GFR: 113.67 mL/min (ref >60.00)
GLUCOSE: 120 mg/dL — AB (ref 70–99)
Potassium: 3.8 mEq/L (ref 3.5–5.1)
Sodium: 131 mEq/L — ABNORMAL LOW (ref 135–145)

## 2018-09-01 LAB — CULTURE, BLOOD (ROUTINE X 2): Culture: NO GROWTH

## 2018-09-01 LAB — HEPATIC FUNCTION PANEL
ALT: 48 U/L — AB (ref 0–35)
AST: 23 U/L (ref 0–37)
Albumin: 3.8 g/dL (ref 3.5–5.2)
Alkaline Phosphatase: 114 U/L (ref 39–117)
BILIRUBIN DIRECT: 0.2 mg/dL (ref 0.0–0.3)
Total Bilirubin: 0.8 mg/dL (ref 0.2–1.2)
Total Protein: 7.2 g/dL (ref 6.0–8.3)

## 2018-09-01 LAB — PROTIME-INR
INR: 5.3 ratio — ABNORMAL HIGH (ref 0.8–1.0)
PROTHROMBIN TIME: 59.9 s — AB (ref 9.6–13.1)

## 2018-09-01 MED ORDER — FUROSEMIDE 20 MG PO TABS: 20.0000 mg | ORAL_TABLET | Freq: Every day | ORAL | 0 refills | Status: AC

## 2018-09-01 MED ORDER — HYDROCODONE-ACETAMINOPHEN 7.5-325 MG PO TABS: 1.0000 | ORAL_TABLET | Freq: Four times a day (QID) | ORAL | 0 refills | Status: AC | PRN

## 2018-09-01 NOTE — Telephone Encounter (Signed)
Spoke with pt. She has been scheduled to see Aaron Edelman on 09/08/18 at 2pm. Nothing further was needed.

## 2018-09-01 NOTE — Assessment & Plan Note (Signed)
?   pulm HTN vs volume overload after IVFs as inpt, for trial lasix 20 qd x 5 days, consider further for persistent leg edema

## 2018-09-01 NOTE — Assessment & Plan Note (Signed)
For f/u INR today, then for f/u coumadin clinic

## 2018-09-01 NOTE — Progress Notes (Signed)
Subjective:    Patient ID: [name], female    DOB: 29-Aug-1930, 83 y.o.   MRN: [id number]  HPI  Here with daughter post hospn 11/19-11/21 with CAP, copd/bronchiectasis, ? pulm HTN, supratherapeutic INR and subacute T12 compression fx after just had kyphoplasty done per Dr Cyndy Freeze to lumbar.  No recent falls, but has ongoing weakness overall, now walking with rollater.  Pain is uncontrolled, asks for Hosp Bed and better pain control than vicodin 5/325 prn.  PT to start tomorrow with [facility] visit, has f/u with NS planned at 2 wks, and pulm Dr [name] next wk.   Pt denies fever, wt loss, night sweats, loss of appetite, or other constitutional symptoms  Pt denies chest pain, increased sob or doe, wheezing, orthopnea, PND, palpitations, dizziness or syncope, except has had persistent LE edema s/p inpatient IVF that has not resolved.  Pt denies new neurological symptoms such as new headache, or facial or extremity weakness or numbness   Pt denies polydipsia, polyuria Past Medical History:  Diagnosis Date  . ALLERGIC RHINITIS   . ANXIETY   . Asthma   . Bronchiectasis 12/11/2011   CT chest dx  . CAROTID ARTERY STENOSIS, RIGHT   . Chronic idiopathic constipation   . Chronic rhinitis   . COMMON MIGRAINE   . COPD   . CORONARY ARTERY DISEASE   . CYST/PSEUDOCYST, PANCREAS   . DEPRESSION   . DIVERTICULOSIS, COLON   . DIZZINESS, CHRONIC   . Dyspnea    on exertion  . Esophageal stricture   . GERD   . HYPERLIPIDEMIA   . HYPERTENSION   . Internal hemorrhoids with complication   . OSTEOPOROSIS   . OVERACTIVE BLADDER   . Pulmonary embolism ([place]) 2008  . PULMONARY HYPERTENSION, SECONDARY   . Stroke ([place])   . Tubular adenoma of colon 10/2013  . Unspecified hearing loss    Past Surgical History:  Procedure Laterality Date  . ABDOMINAL HYSTERECTOMY  1965  . APPENDECTOMY  1963  . CHOLECYSTECTOMY  1963  . COLONOSCOPY N/A 10/19/2013   Procedure: COLONOSCOPY;  Surgeon: Ladene Artist, MD;  Location:  [facility];  Service: Endoscopy;  Laterality: N/A;  . EGD  1999  . KYPHOPLASTY N/A 08/20/2018   Procedure: Lumbar three KYPHOPLASTY;  Surgeon: Ashok Pall, MD;  Location: [facility];  Service: Neurosurgery;  Laterality: N/A;  . PANCREATIC CYST DRAINAGE     x 2  . ROOT CANAL    . s/p sinus surgury    . TONSILLECTOMY      reports that she has never smoked. She has never used smokeless tobacco. She reports that she does not drink alcohol or use drugs. family history includes Breast cancer in her daughter; Emphysema in her father; Heart attack in her mother; Heart disease in her mother; Lymphoma in her sister; Stroke in her father. Allergies  Allergen Reactions  . Raloxifene Other (See Comments)    REACTION: risk of recurrent stroke  . Sulfonamide Derivatives Anaphylaxis    REACTION: tongue swells  . Amoxicillin-Pot Clavulanate Diarrhea    REACTION: diarrhea Has patient had a PCN reaction causing immediate rash, facial/tongue/throat swelling, SOB or lightheadedness with hypotension: unknown Has patient had a PCN reaction causing severe rash involving mucus membranes or skin necrosis: unknown Has patient had a PCN reaction that required hospitalization : unknown Has patient had a PCN reaction occurring within the last 10 years: yes   pt states she should be able to take penicillin, but cant  remember actually taking it   . Ciclesonide Other (See Comments)    REACTION: bad smell  . Ciprofloxacin Other (See Comments)    dizziness   Current Outpatient Medications on File Prior to Visit  Medication Sig Dispense Refill  . acetaminophen (TYLENOL) 500 MG tablet Take 1,000 mg by mouth every 6 (six) hours as needed for moderate pain.    [name] albuterol (ACCUNEB) 0.63 MG/3ML nebulizer solution Take 3 mLs (0.63 mg total) by nebulization every 6 (six) hours as needed for wheezing. 75 mL 12  . amLODipine (NORVASC) 5 MG tablet TAKE 1 TABLET EVERY DAY 90 tablet 1  . budesonide (RHINOCORT ALLERGY) 32  MCG/ACT nasal spray Place 1 spray into both nostrils daily.     . cyclobenzaprine (FLEXERIL) 10 MG tablet Take 1 tablet (10 mg total) by mouth 3 (three) times daily as needed for muscle spasms. 40 tablet 1  . Digestive Enzymes (PAPAYA AND ENZYMES) CHEW Chew 2 tablets by mouth 3 (three) times daily with meals as needed (digestion of big meals).     [name] HYDROcodone-acetaminophen (NORCO/VICODIN) 5-325 MG tablet Take 1 tablet by mouth every 6 (six) hours as needed for moderate pain. 30 tablet 0  . loratadine (CLARITIN) 10 MG tablet Take 10 mg by mouth daily as needed for allergies.     [name] losartan (COZAAR) 100 MG tablet TAKE 1 TABLET EVERY DAY 90 tablet 1  . Multiple Vitamins-Minerals (MACUVITE) TABS Take 1 tablet by mouth daily.      . Probiotic Product (PROBIOTIC DAILY PO) Take 1 tablet by mouth daily.    . promethazine (PHENERGAN) 25 MG tablet Take 1 tablet (25 mg total) by mouth every 6 (six) hours as needed for nausea or vomiting. 30 tablet 1  . sertraline (ZOLOFT) 50 MG tablet Take 1 tablet (50 mg total) by mouth daily. 90 tablet 3  . VENTOLIN HFA 108 (90 Base) MCG/ACT inhaler INHALE 2 PUFFS INTO THE LUNGS EVERY 6 HOURS AS NEEDED FOR WHEEZING OR SHORTNESS OF BREATH. (Patient taking differently: Inhale 2 puffs into the lungs every 6 (six) hours as needed. ) 18 Inhaler 0  . warfarin (COUMADIN) 5 MG tablet TAKE AS DIRECTED BY ANTICOAGULATION CLINIC (Patient taking differently: Take 2.5-5 mg by mouth daily. 5 mg on Mon and Fri 2.5 mg on Tues, Wed, Thurs, Sat and Sun) 60 tablet 1   No current facility-administered medications on file prior to visit.    Review of Systems  Constitutional: Negative for other unusual diaphoresis or sweats HENT: Negative for ear discharge or swelling Eyes: Negative for other worsening visual disturbances Respiratory: Negative for stridor or other swelling  Gastrointestinal: Negative for worsening distension or other blood Genitourinary: Negative for retention or other  urinary change Musculoskeletal: Negative for other MSK pain or swelling Skin: Negative for color change or other new lesions Neurological: Negative for worsening tremors and other numbness  Psychiatric/Behavioral: Negative for worsening agitation or other fatigue All other system neg per pt    Objective:   Physical Exam BP (!) 142/84   Pulse 87   Temp 97.7 F (36.5 C) (Oral)   Ht 5\' 5"  (1.651 m)   Wt 140 lb (63.5 kg)   SpO2 91%   BMI 23.30 kg/m  VS noted,  Constitutional: Pt appears in NAD HENT: Head: NCAT.  Right Ear: External ear normal.  Left Ear: External ear normal.  Eyes: . Pupils are equal, round, and reactive to light. Conjunctivae and EOM are normal Nose: without d/c or deformity  Neck: Neck supple. Gross normal ROM Cardiovascular: Normal rate and regular rhythm.   Pulmonary/Chest: Effort normal and breath sounds without rales or wheezing.  Abd:  Soft, NT, ND, + BS, no organomegaly Spine nontender in midline to lower thoracic/lumbar Neurological: Pt is alert. At baseline orientation, motor grossly intact Skin: Skin is warm. No rashes, other new lesions, 1-2+ bilat LE edema to knees. Psychiatric: Pt behavior is normal without agitation  No other exam findings Lab Results  Component Value Date   WBC 13.9 (H) 09/01/2018   HGB 15.1 (H) 09/01/2018   HCT 45.2 09/01/2018   PLT 308.0 09/01/2018   GLUCOSE 120 (H) 09/01/2018   CHOL 207 (H) 10/15/2017   TRIG 100.0 10/15/2017   HDL 71.20 10/15/2017   LDLDIRECT 122.7 11/10/2012   LDLCALC 116 (H) 10/15/2017   ALT 48 (H) 09/01/2018   AST 23 09/01/2018   NA 131 (L) 09/01/2018   K 3.8 09/01/2018   CL 91 (L) 09/01/2018   CREATININE 0.54 09/01/2018   BUN 19 09/01/2018   CO2 30 09/01/2018   TSH 2.44 10/15/2017   INR 5.3 (H) 09/01/2018   HGBA1C 5.9 10/15/2017       Assessment & Plan:

## 2018-09-01 NOTE — Assessment & Plan Note (Signed)
clinicall resolved,  to f/u any worsening symptoms or concerns

## 2018-09-01 NOTE — Assessment & Plan Note (Signed)
For Hosp Bed, PT eval with [facility] with persistent LBP

## 2018-09-01 NOTE — Patient Instructions (Addendum)
OK to fill and take the increased Vicodin 7.5's as needed  Ok to fill and take the Vicodin 5 mg's after that  Please take all new medication as prescribed - the lasix 20 mg daily for 5 days only  (we can take further if still having persistent leg swelling, but it may not be needed)  Your left ear was irrigated of wax today  You are given the Prescription for the Hospital Bed to give to [facility] tomorrow  Please continue all other medications as before, and refills have been done if requested.  Please have the pharmacy call with any other refills you may need.  Please continue your efforts at being more active, low cholesterol diet, and weight control  Please keep your appointments with your specialists as you may have planned - Dr [name] next Monday, [facility] and PT tomorrow, and Dr Christella Noa in 2 weeks  Please go to the LAB in the Basement (turn left off the elevator) for the tests to be done today  You will be contacted by phone if any changes need to be made immediately.  Otherwise, you will receive a letter about your results with an explanation, but please check with MyChart first.  Please remember to sign up for MyChart if you have not done so, as this will be important to you in the future with finding out test results, communicating by private email, and scheduling acute appointments online when needed.  Please return in 1 month, or sooner if needed

## 2018-09-01 NOTE — Assessment & Plan Note (Signed)
Uncontrolled pain, for increased vicodin 7.5 prn, f/u NS as planned

## 2018-09-01 NOTE — Assessment & Plan Note (Signed)
O/w stable, f/u pulm as planned

## 2018-09-01 NOTE — Telephone Encounter (Signed)
-----   Message from Collene Gobble, MD sent at 08/29/2018  3:11 PM EST ----- Regarding: FW: follow up Patient needs a hospital follow-up visit with [name].  Thanks RB  ----- Message ----- From: Elodia Florence., MD Sent: 08/28/2018   8:53 PM EST To: Collene Gobble, MD Subject: follow up                                      Hey Dr. [name],  Can you help this patient get a follow up appointment with you.  She was treated for pneumonia, but also had the possible chronic mac infection.  Thanks, [employer]

## 2018-09-02 ENCOUNTER — Ambulatory Visit (INDEPENDENT_AMBULATORY_CARE_PROVIDER_SITE_OTHER): Payer: Medicare Other | Admitting: General Practice

## 2018-09-02 DIAGNOSIS — Z86711 Personal history of pulmonary embolism: Secondary | ICD-10-CM

## 2018-09-02 DIAGNOSIS — J479 Bronchiectasis, uncomplicated: Secondary | ICD-10-CM | POA: Diagnosis not present

## 2018-09-02 DIAGNOSIS — J441 Chronic obstructive pulmonary disease with (acute) exacerbation: Secondary | ICD-10-CM | POA: Diagnosis not present

## 2018-09-02 DIAGNOSIS — Z7901 Long term (current) use of anticoagulants: Secondary | ICD-10-CM | POA: Diagnosis not present

## 2018-09-02 DIAGNOSIS — J189 Pneumonia, unspecified organism: Secondary | ICD-10-CM | POA: Diagnosis not present

## 2018-09-02 DIAGNOSIS — I10 Essential (primary) hypertension: Secondary | ICD-10-CM | POA: Diagnosis not present

## 2018-09-02 DIAGNOSIS — S22069D Unspecified fracture of T7-T8 vertebra, subsequent encounter for fracture with routine healing: Secondary | ICD-10-CM | POA: Diagnosis not present

## 2018-09-02 DIAGNOSIS — Z9981 Dependence on supplemental oxygen: Secondary | ICD-10-CM | POA: Diagnosis not present

## 2018-09-02 DIAGNOSIS — I272 Pulmonary hypertension, unspecified: Secondary | ICD-10-CM | POA: Diagnosis not present

## 2018-09-02 DIAGNOSIS — S22080D Wedge compression fracture of T11-T12 vertebra, subsequent encounter for fracture with routine healing: Secondary | ICD-10-CM | POA: Diagnosis not present

## 2018-09-02 DIAGNOSIS — I2510 Atherosclerotic heart disease of native coronary artery without angina pectoris: Secondary | ICD-10-CM | POA: Diagnosis not present

## 2018-09-02 DIAGNOSIS — J440 Chronic obstructive pulmonary disease with acute lower respiratory infection: Secondary | ICD-10-CM | POA: Diagnosis not present

## 2018-09-02 NOTE — Patient Instructions (Signed)
Pre visit review using our clinic review tool, if applicable. No additional management support is needed unless otherwise documented below in the visit note.  Hold coumadin through Thursday and then continue to take 1/2 tablet all days except 1 tablet on Mondays and Fridays.  Re-check in 2 weeks.

## 2018-09-07 NOTE — Progress Notes (Signed)
_0  ID: [name], female    DOB: 02/10/1930, 84 y.o.   MRN: [id number]  Chief Complaint  Patient presents with  . Hospitalization Follow-up    PNU    Referring provider: [name], MD  HPI:  84 year old female never smoker (passive smoke exposure from spouse for 15 years) followed in our office for moderately to severe COPD, bronchiectasis and micronodular disease suspicious for mycobacterial colonization  PMH: Thromboembolic disease Smoker/ Smoking History: Never Smoker.  Passive smoke exposure from spouse for 15 years. Maintenance:  None  Pt of: Dr. [name]   09/08/2018  - Visit   84 year old female patient presenting to office today for hospital follow-up.  Patient was recently seen in November/2019 and treated for right middle lobe pneumonia.  Patient reports that she feels that she is improving each day.  Patient denies fevers.  Patient reports improved diet as well as hydration over the past couple weeks.  Patient has been staying with daughter and son-in-law who have been helping care for patient.  Patient is actively walking and using a walker as well as her cane.  Patient reports that every day she feels that she is getting slightly better but still gets fatigued and tired.  Patient denies sputum color changes.  Patient is not currently using a flutter valve.  Patient does use incentive spirometer.  Patient in back brace today.  Patient following up with neurosurgery next week.   Tests:  08/26/2018-CT Angio- negative for acute PE, right-sided bronchiectasis and mucoid impaction with right middle lobe collapse no progression since 2018, may be underlying chronic MAC infection  11/28/11-echocardiogram-LV ejection fraction 55 to 60%, PA P pressure 71  05/17/2016-respiratory sputum culture- moderate Pseudomonas   01/09/2028-pulmonary function test- FVC 2.13 (75% predicted, postbronchodilator ratio 62, FEV1 69, no significant bronchodilator response, slight mid  flow reversibility after bronchodilator could indicate small airways disease, DLCO 67 which over corrects with lung volumes to 121  FENO:  No results found for: NITRICOXIDE  PFT: No flowsheet data found.  Imaging: Dg Chest 2 View  Result Date: 08/26/2018 CLINICAL DATA:  Chest pain and dyspnea. COPD. EXAM: CHEST - 2 VIEW COMPARISON:  05/08/2018 chest radiograph. FINDINGS: Stable cardiomediastinal silhouette with normal heart size. Asymmetric mild right hilar prominence is stable since 01/16/2017 chest CT angiogram study, where it was seen to be due to vascular shadows. No pneumothorax. No pleural effusion. Hyperinflated lungs. No pulmonary edema. Stable right middle lobe bandlike scarring. No acute consolidative airspace disease. Stable moderate midthoracic vertebral compression fracture since 06/30/2018 thoracic spine radiographs. IMPRESSION: 1. No acute cardiopulmonary disease. 2. Hyperinflated lungs, suggesting COPD. 3. Stable right middle lobe bandlike scarring. Electronically Signed   By: Ilona Sorrel M.D.   On: 08/26/2018 17:48   Dg Lumbar Spine 2-3 Views  Result Date: 08/20/2018 CLINICAL DATA:  Kyphoplasty L3 EXAM: DG C-ARM 61-120 MIN; LUMBAR SPINE - 2-3 VIEW COMPARISON:  Lumbar MRI 08/06/2018 FINDINGS: AP and lateral C-arm images were obtained. Kyphoplasty of L3. Cement is present in the superior vertebral body bilaterally with good distribution. IMPRESSION: Kyphoplasty for L3 superior endplate fracture. Electronically Signed   By: Franchot Gallo M.D.   On: 08/20/2018 10:08   Ct Angio Chest Pe W/cm &/or Wo Cm  Result Date: 08/26/2018 CLINICAL DATA:  High pretest probability for pulmonary embolism. EXAM: CT ANGIOGRAPHY CHEST WITH CONTRAST TECHNIQUE: Multidetector CT imaging of the chest was performed using the standard protocol during bolus administration of intravenous contrast. Multiplanar CT image reconstructions and MIPs  were obtained to evaluate the vascular anatomy. CONTRAST:  113m  ISOVUE-370 IOPAMIDOL (ISOVUE-370) INJECTION 76% COMPARISON:  01/16/2017 FINDINGS: Cardiovascular: Normal heart size. No pericardial effusion. No acute pulmonary embolism. Truncated appearance of multiple peripheral pulmonary arteries, likely accentuated by motion. This finding is best demonstrated in the lingula, as demarcated on series 5. Patient has history of pulmonary embolism in 2008 and this is presumably related sequela. Main pulmonary artery is enlarged to 3.5 cm, suggesting pulmonary hypertension. Aortic and coronary atherosclerosis Mediastinum/Nodes: Negative for adenopathy or mass. Lungs/Pleura: The bronchiectasis in the right middle lobe with collapse. Bronchiectasis with mucoid impaction within the anterior basal segment right lower lobe and anterior segment right upper lobe, chronic. Clustered micro nodularity in the posterior right lower lobe, new. Upper Abdomen: Negative Musculoskeletal: Nonacute T8 and T12 compression fractures. T8 height loss is advanced and T12 height loss is mild. Both have occurred since April 2018. The T8 fracture appears chronic and is sclerotic. At T12, fracture lucencies are still visible. Review of the MIP images confirms the above findings. IMPRESSION: 1. Negative for acute pulmonary embolism. 2. 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 Large main pulmonary artery suggesting associated hypertension. 3. Right-sided bronchiectasis and mucoid impaction with right middle lobe collapse. No progression since 2018. There may be underlying chronic MAC infection. 4. Minimal infectious or inflammatory opacity in the right lower lobe likely related to #3, please correlate for active infectious symptoms. 5. Nonacute compression fractures of T8 and T12 that have occurred since 2018. The T12 fracture is likely subacute. Electronically Signed   By: JMonte FantasiaM.D.   On: 08/26/2018 20:18   Mr  Brain Wo Contrast  Result Date: 08/27/2018 CLINICAL DATA:  Possible CVA. LEFT arm weakness. Motion due to cough. EXAM: MRI HEAD WITHOUT CONTRAST TECHNIQUE: Multiplanar, multiecho pulse sequences of the brain and surrounding structures were obtained without intravenous contrast. COMPARISON:  MR head 12/01/2007. FINDINGS: Brain: No acute infarction, hemorrhage, hydrocephalus, extra-axial collection or mass lesion. Generalized atrophy. T2 and FLAIR hyperintensities throughout the white matter, consistent with moderately advanced small vessel disease. Remote RIGHT frontal ovoid to spherical deep white matter infarct versus chronic neuroglial cyst stable from 2009. Vascular: Flow voids are maintained throughout the carotid, basilar, and vertebral arteries. There are no areas of chronic hemorrhage. Skull and upper cervical spine: Unremarkable visualized calvarium, skullbase, and cervical vertebrae. Pituitary, pineal, cerebellar tonsils unremarkable. No upper cervical cord lesions. Sinuses/Orbits: No orbital masses or proptosis. Globes appear symmetric. Sinuses appear well aerated, without evidence for air-fluid level. Other: No nasopharyngeal pathology or mastoid fluid. Scalp and other visualized extracranial soft tissues grossly unremarkable. When compared with the previous study 10 years ago, chronic microvascular ischemic change has progressed. IMPRESSION: Atrophy with advanced small vessel disease progressive from prior MR. Chronic RIGHT frontal infarct versus neuroglial cyst, unchanged from 2009. No acute intracranial findings. Electronically Signed   By: JStaci RighterM.D.   On: 08/27/2018 18:36   Dg C-arm 1-60 Min  Result Date: 08/20/2018 CLINICAL DATA:  Kyphoplasty L3 EXAM: DG C-ARM 61-120 MIN; LUMBAR SPINE - 2-3 VIEW COMPARISON:  Lumbar MRI 08/06/2018 FINDINGS: AP and lateral C-arm images were obtained. Kyphoplasty of L3. Cement is present in the superior vertebral body bilaterally with good distribution.  IMPRESSION: Kyphoplasty for L3 superior endplate fracture. Electronically Signed   By: CFranchot GalloM.D.   On: 08/20/2018 10:08    Chart Review:  08/19/2018-hospitalization-community-acquired pneumonia CTA negative for PE but concerning  for right-sided pneumonia, patient was treated with ceftriaxone and azithromycin, transition to Ceftin ear and azithromycin at discharge questionable right middle lobe collapse >>>Discharge date 08/28/2018 >>>Patient has already followed up with primary care on 09/01/2018   Specialty Problems      Pulmonary Problems   Allergic rhinitis    Qualifier: Diagnosis of  By: [name] MD, Hunt Oris       Dyspnea    Qualifier: Diagnosis of  By: Melvyn Novas MD, Christena Deem       COPD with acute exacerbation ([place])    Qualifier: Diagnosis of  By: [name] MD, Rose Fillers       Chronic rhinitis    Qualifier: Diagnosis of  By: Melvyn Novas MD, Christena Deem       Bronchiectasis [facility])    Followed in Pulmonary clinic/ [facility]/ [place]      - PFT's 01/09/2008 Mild airflow obstruction no response to B2      Chronic respiratory failure [facility])    Patient Saturations on Room Air at Rest = 87% Cecille Rubin cooper, CMA 08/14/11   - ono  12/25/11 ok on 2lpm> no desat          Acute upper respiratory infection   Wheezing   Cough   CAP (community acquired pneumonia)   Chronic obstructive pulmonary disease ([place])   Pneumonia of right middle lobe due to infectious organism ([place])      Allergies  Allergen Reactions  . Raloxifene Other (See Comments)    REACTION: risk of recurrent stroke  . Sulfonamide Derivatives Anaphylaxis    REACTION: tongue swells  . Amoxicillin-Pot Clavulanate Diarrhea    REACTION: diarrhea Has patient had a PCN reaction causing immediate rash, facial/tongue/throat swelling, SOB or lightheadedness with hypotension: unknown Has patient had a PCN reaction causing severe rash involving mucus membranes or skin necrosis: unknown Has patient had a PCN reaction that  required hospitalization : unknown Has patient had a PCN reaction occurring within the last 10 years: yes   pt states she should be able to take penicillin, but cant remember actually taking it   . Ciclesonide Other (See Comments)    REACTION: bad smell  . Ciprofloxacin Other (See Comments)    dizziness    Immunization History  Administered Date(s) Administered  . Influenza Split 07/11/2011, 09/07/2012, 07/30/2013  . Influenza Whole 07/05/2008, 08/02/2009  . Influenza, High Dose Seasonal PF 07/11/2016, 07/15/2017, 06/24/2018  . Influenza,inj,Quad PF,6+ Mos 07/27/2014  . Influenza-Unspecified 08/09/2015  . Pneumococcal Conjugate-13 08/28/2013  . Pneumococcal Polysaccharide-23 04/06/2009  . Td 04/06/2009    Past Medical History:  Diagnosis Date  . ALLERGIC RHINITIS   . ANXIETY   . Asthma   . Bronchiectasis 12/11/2011   CT chest dx  . CAROTID ARTERY STENOSIS, RIGHT   . Chronic idiopathic constipation   . Chronic rhinitis   . COMMON MIGRAINE   . COPD   . CORONARY ARTERY DISEASE   . CYST/PSEUDOCYST, PANCREAS   . DEPRESSION   . DIVERTICULOSIS, COLON   . DIZZINESS, CHRONIC   . Dyspnea    on exertion  . Esophageal stricture   . GERD   . HYPERLIPIDEMIA   . HYPERTENSION   . Internal hemorrhoids with complication   . OSTEOPOROSIS   . OVERACTIVE BLADDER   . Pulmonary embolism ([place]) 2008  . PULMONARY HYPERTENSION, SECONDARY   . Stroke ([place])   . Tubular adenoma of colon 10/2013  . Unspecified hearing loss     Tobacco History: Social History   Tobacco  Use  Smoking Status Never Smoker  Smokeless Tobacco Never Used  Tobacco Comment   spouse smoked in the home for 15 years   Counseling given: Yes Comment: spouse smoked in the home for 15 years  Continue to not smoke  Outpatient Encounter Medications as of 09/08/2018  Medication Sig  . acetaminophen (TYLENOL) 500 MG tablet Take 1,000 mg by mouth every 6 (six) hours as needed for moderate pain.  [name] albuterol (ACCUNEB)  0.63 MG/3ML nebulizer solution Take 3 mLs (0.63 mg total) by nebulization every 6 (six) hours as needed for wheezing.  [name] amLODipine (NORVASC) 5 MG tablet TAKE 1 TABLET EVERY DAY  . budesonide (RHINOCORT ALLERGY) 32 MCG/ACT nasal spray Place 1 spray into both nostrils daily.   . cyclobenzaprine (FLEXERIL) 10 MG tablet Take 1 tablet (10 mg total) by mouth 3 (three) times daily as needed for muscle spasms.  . Digestive Enzymes (PAPAYA AND ENZYMES) CHEW Chew 2 tablets by mouth 3 (three) times daily with meals as needed (digestion of big meals).   [name] HYDROcodone-acetaminophen (NORCO) 7.5-325 MG tablet Take 1 tablet by mouth every 6 (six) hours as needed for moderate pain.  [name] HYDROcodone-acetaminophen (NORCO/VICODIN) 5-325 MG tablet Take 1 tablet by mouth every 6 (six) hours as needed for moderate pain.  [name] loratadine (CLARITIN) 10 MG tablet Take 10 mg by mouth daily as needed for allergies.   [name] losartan (COZAAR) 100 MG tablet TAKE 1 TABLET EVERY DAY  . Multiple Vitamins-Minerals (MACUVITE) TABS Take 1 tablet by mouth daily.    . Probiotic Product (PROBIOTIC DAILY PO) Take 1 tablet by mouth daily.  . promethazine (PHENERGAN) 25 MG tablet Take 1 tablet (25 mg total) by mouth every 6 (six) hours as needed for nausea or vomiting.  . sertraline (ZOLOFT) 50 MG tablet Take 1 tablet (50 mg total) by mouth daily.  . VENTOLIN HFA 108 (90 Base) MCG/ACT inhaler INHALE 2 PUFFS INTO THE LUNGS EVERY 6 HOURS AS NEEDED FOR WHEEZING OR SHORTNESS OF BREATH. (Patient taking differently: Inhale 2 puffs into the lungs every 6 (six) hours as needed. )  . warfarin (COUMADIN) 5 MG tablet TAKE AS DIRECTED BY ANTICOAGULATION CLINIC (Patient taking differently: Take 2.5-5 mg by mouth daily. 5 mg on Mon and Fri 2.5 mg on Tues, Wed, Thurs, Sat and Sun)  . furosemide (LASIX) 20 MG tablet Take 1 tablet (20 mg total) by mouth daily. (Patient not taking: Reported on 09/08/2018)   No facility-administered encounter medications on file as of  09/08/2018.      Review of Systems  Review of Systems  Constitutional: Positive for fatigue. Negative for chills, fever and unexpected weight change.  HENT: Positive for congestion. Negative for ear pain, postnasal drip, sinus pressure and sinus pain.   Respiratory: Positive for cough and shortness of breath (with exertion ). Negative for chest tightness and wheezing.   Cardiovascular: Negative for chest pain and palpitations.  Gastrointestinal: Positive for nausea. Negative for blood in stool, diarrhea and vomiting.  Musculoskeletal:       In back brace   Skin: Negative for color change.  Allergic/Immunologic: Negative for environmental allergies and food allergies.  Neurological: Negative for dizziness, light-headedness and headaches.  Psychiatric/Behavioral: Negative for dysphoric mood. The patient is not nervous/anxious.   All other systems reviewed and are negative.    Physical Exam  BP (!) 128/54 (BP Location: Left Arm, Cuff Size: Normal)   Pulse 96   Ht _0  (1.575 m)   Wt 134 lb  9.6 oz (61.1 kg)   SpO2 92%   BMI 24.62 kg/m   Wt Readings from Last 5 Encounters:  09/08/18 134 lb 9.6 oz (61.1 kg)  09/01/18 140 lb (63.5 kg)  08/26/18 147 lb (66.7 kg)  08/20/18 146 lb (66.2 kg)  08/14/18 138 lb 12.8 oz (63 kg)     Physical Exam  Constitutional: She is oriented to person, place, and time and well-developed, well-nourished, and in no distress. No distress.  HENT:  Head: Normocephalic and atraumatic.  Right Ear: Hearing, tympanic membrane, external ear and ear canal normal.  Left Ear: Hearing, tympanic membrane, external ear and ear canal normal.  Nose: Mucosal edema present. Right sinus exhibits no maxillary sinus tenderness and no frontal sinus tenderness. Left sinus exhibits no maxillary sinus tenderness and no frontal sinus tenderness.  Mouth/Throat: Uvula is midline and oropharynx is clear and moist. No oropharyngeal exudate.  Eyes: Pupils are equal, round, and  reactive to light.  Neck: Normal range of motion. Neck supple. No JVD present.  Cardiovascular: Normal rate, regular rhythm and normal heart sounds.  Pulmonary/Chest: Effort normal. No accessory muscle usage. No respiratory distress. She has no decreased breath sounds. She has no wheezes. She has rhonchi (Right middle lobe, improved aeration after cough).  Difficult to fully assess with back brace on  Abdominal: Soft. Bowel sounds are normal. There is no tenderness.  Musculoskeletal: Normal range of motion. She exhibits no edema.  Lymphadenopathy:    She has no cervical adenopathy.  Neurological: She is alert and oriented to person, place, and time.  Using walker   Skin: Skin is warm and dry. She is not diaphoretic. No erythema.  Psychiatric: Mood, memory, affect and judgment normal.  Nursing note and vitals reviewed.     Lab Results:  CBC    Component Value Date/Time   WBC 13.9 (H) 09/01/2018 1407   RBC 4.79 09/01/2018 1407   HGB 15.1 (H) 09/01/2018 1407   HCT 45.2 09/01/2018 1407   PLT 308.0 09/01/2018 1407   MCV 94.5 09/01/2018 1407   MCH 31.6 08/28/2018 0537   MCHC 33.3 09/01/2018 1407   RDW 13.7 09/01/2018 1407   LYMPHSABS 2.0 09/01/2018 1407   MONOABS 1.2 (H) 09/01/2018 1407   EOSABS 0.0 09/01/2018 1407   BASOSABS 0.0 09/01/2018 1407    BMET    Component Value Date/Time   NA 131 (L) 09/01/2018 1407   K 3.8 09/01/2018 1407   CL 91 (L) 09/01/2018 1407   CO2 30 09/01/2018 1407   GLUCOSE 120 (H) 09/01/2018 1407   BUN 19 09/01/2018 1407   CREATININE 0.54 09/01/2018 1407   CALCIUM 9.7 09/01/2018 1407   GFRNONAA >60 08/28/2018 0537   GFRAA >60 08/28/2018 0537    BNP No results found for: BNP  ProBNP No results found for: PROBNP    Assessment & Plan:   Pleasant 84 year old female patient completing hospital follow-up with our office today.  Patient is clinically improved since hospital discharge.  We will get patient started on using flutter valve as well  as patient will continue using incentive spirometer.  Will follow patient's recent CT results with another CT in January/2020 to ensure right middle lobe improvement if not may need to consider further intervention such as a bronchoscopy/BAL.  Due to patient's probable MAI infection will try to get sputum culture from patient today.  The patient's previous sputum did grow out Pseudomonas.  Continue oxygen therapy as prescribed  Chronic respiratory failure Continue oxygen therapy as  prescribed  Bronchiectasis ([place]) Sputum culture >>>We will run this 3 ways: Respiratory sputum, Mycobacterium, fungal >>>Produce a sputum sample in the morning and bring to [place] you lump lab within 3 hours of producing sputum  Continue to use incentive spirometer  Bronchiectasis: This is the medical term which indicates that you have damage, dilated airways making you more susceptible to respiratory infection. Use a flutter valve 10 breaths twice a day or 4 to 5 breaths 4-5 times a day to help clear mucus out Let us know if you have cough with change in mucus color or fevers or chills.  At that point you would need an antibiotic. Maintain a healthy nutritious diet, eating whole foods Take your medications as prescribed   Keep an eye on symptoms Ensure you are eating whole nutritious meals and adequately hydrating  Contact our office immediately if symptoms are not improving or fever start  We will get a CT without contrast in January/2020  Follow up in 6-8 weeks    Pneumonia of right middle lobe due to infectious organism (HCC) Sputum culture >>>We will run this 3 ways: Respiratory sputum, Mycobacterium, fungal >>>Produce a sputum sample in the morning and bring to [place] you lump lab within 3 hours of producing sputum  Keep an eye on symptoms Ensure you are eating whole nutritious meals and adequately hydrating  Contact our office immediately if symptoms are not improving or fever start  We will get  a CT without contrast in January/2020  Follow up in 6-8 weeks     This appointment was 38 minutes along with over 50% of that time in direct face-to-face patient care, assessment, plan of care discussion, discussion of plan of care with Dr. Mila Merry, NP 09/08/2018

## 2018-09-08 ENCOUNTER — Encounter: Payer: Self-pay | Admitting: Pulmonary Disease

## 2018-09-08 ENCOUNTER — Telehealth: Payer: Self-pay | Admitting: Internal Medicine

## 2018-09-08 ENCOUNTER — Ambulatory Visit (INDEPENDENT_AMBULATORY_CARE_PROVIDER_SITE_OTHER): Payer: Medicare Other | Admitting: Pulmonary Disease

## 2018-09-08 ENCOUNTER — Ambulatory Visit (INDEPENDENT_AMBULATORY_CARE_PROVIDER_SITE_OTHER): Payer: Medicare Other | Admitting: General Practice

## 2018-09-08 VITALS — BP 128/54 | HR 96 | Ht 62.0 in | Wt 134.6 lb

## 2018-09-08 DIAGNOSIS — Z7901 Long term (current) use of anticoagulants: Secondary | ICD-10-CM

## 2018-09-08 DIAGNOSIS — J189 Pneumonia, unspecified organism: Secondary | ICD-10-CM

## 2018-09-08 DIAGNOSIS — J9611 Chronic respiratory failure with hypoxia: Secondary | ICD-10-CM

## 2018-09-08 DIAGNOSIS — J479 Bronchiectasis, uncomplicated: Secondary | ICD-10-CM | POA: Diagnosis not present

## 2018-09-08 DIAGNOSIS — J181 Lobar pneumonia, unspecified organism: Secondary | ICD-10-CM

## 2018-09-08 DIAGNOSIS — Z86711 Personal history of pulmonary embolism: Secondary | ICD-10-CM

## 2018-09-08 LAB — POCT INR: INR: 3 (ref 2.0–3.0)

## 2018-09-08 NOTE — Patient Instructions (Addendum)
Sputum culture >>>We will run this 3 ways: Respiratory sputum, Mycobacterium, fungal >>>Produce a sputum sample in the morning and bring to [place] you lump lab within 3 hours of producing sputum  Continue to use incentive spirometer  Bronchiectasis: This is the medical term which indicates that you have damage, dilated airways making you more susceptible to respiratory infection. Use a flutter valve 10 breaths twice a day or 4 to 5 breaths 4-5 times a day to help clear mucus out Let us know if you have cough with change in mucus color or fevers or chills.  At that point you would need an antibiotic. Maintain a healthy nutritious diet, eating whole foods Take your medications as prescribed   Keep an eye on symptoms Ensure you are eating whole nutritious meals and adequately hydrating  Contact our office immediately if symptoms are not improving or fever start  We will get a CT without contrast in January/2020  [facility]:  Address: [address] [place], [place] [address]  Phone: [phone]   Follow up in 6-8 weeks    It is flu season:   >>>Remember to be washing your hands regularly, using hand sanitizer, be careful to use around herself with has contact with people who are sick will increase her chances of getting sick yourself. >>> Best ways to protect herself from the flu: Receive the yearly flu vaccine, practice good hand hygiene washing with soap and also using hand sanitizer when available, eat a nutritious meals, get adequate rest, hydrate appropriately   Please contact the office if your symptoms worsen or you have concerns that you are not improving.   Thank you for choosing [facility] for your healthcare, and for allowing [name] to partner with you on your healthcare journey. I am thankful to be able to provide care to you today.   Wyn Quaker FNP-C

## 2018-09-08 NOTE — Assessment & Plan Note (Signed)
Continue oxygen therapy as prescribed 

## 2018-09-08 NOTE — Telephone Encounter (Signed)
Copied from [place] [id number]. Topic: Quick Communication - Home Health Verbal Orders >> Sep 08, 2018 12:02 PM Scherrie Gerlach wrote: Caller/Agency: [facility] Sharyn Lull Callback Number: [phone]  Requesting verbal for Skilled Nursing Frequency:   1 wk 9

## 2018-09-08 NOTE — Patient Instructions (Addendum)
Pre visit review using our clinic review tool, if applicable. No additional management support is needed unless otherwise documented below in the visit note.  Change dosage and take 1/2 tablet all days except 1 tablet on Fridays.  Re-check at appointment with Dr. [name] on 12/23.

## 2018-09-08 NOTE — Assessment & Plan Note (Signed)
Sputum culture >>>We will run this 3 ways: Respiratory sputum, Mycobacterium, fungal >>>Produce a sputum sample in the morning and bring to [place] you lump lab within 3 hours of producing sputum  Continue to use incentive spirometer  Bronchiectasis: This is the medical term which indicates that you have damage, dilated airways making you more susceptible to respiratory infection. Use a flutter valve 10 breaths twice a day or 4 to 5 breaths 4-5 times a day to help clear mucus out Let us know if you have cough with change in mucus color or fevers or chills.  At that point you would need an antibiotic. Maintain a healthy nutritious diet, eating whole foods Take your medications as prescribed   Keep an eye on symptoms Ensure you are eating whole nutritious meals and adequately hydrating  Contact our office immediately if symptoms are not improving or fever start  We will get a CT without contrast in January/2020  Follow up in 6-8 weeks

## 2018-09-08 NOTE — Assessment & Plan Note (Signed)
Sputum culture >>>We will run this 3 ways: Respiratory sputum, Mycobacterium, fungal >>>Produce a sputum sample in the morning and bring to [place] you lump lab within 3 hours of producing sputum  Keep an eye on symptoms Ensure you are eating whole nutritious meals and adequately hydrating  Contact our office immediately if symptoms are not improving or fever start  We will get a CT without contrast in January/2020  Follow up in 6-8 weeks

## 2018-09-09 ENCOUNTER — Telehealth: Payer: Self-pay | Admitting: Internal Medicine

## 2018-09-09 DIAGNOSIS — J189 Pneumonia, unspecified organism: Secondary | ICD-10-CM | POA: Diagnosis not present

## 2018-09-09 DIAGNOSIS — J440 Chronic obstructive pulmonary disease with acute lower respiratory infection: Secondary | ICD-10-CM | POA: Diagnosis not present

## 2018-09-09 DIAGNOSIS — S22080D Wedge compression fracture of T11-T12 vertebra, subsequent encounter for fracture with routine healing: Secondary | ICD-10-CM | POA: Diagnosis not present

## 2018-09-09 DIAGNOSIS — S22069D Unspecified fracture of T7-T8 vertebra, subsequent encounter for fracture with routine healing: Secondary | ICD-10-CM | POA: Diagnosis not present

## 2018-09-09 DIAGNOSIS — J441 Chronic obstructive pulmonary disease with (acute) exacerbation: Secondary | ICD-10-CM | POA: Diagnosis not present

## 2018-09-09 DIAGNOSIS — J479 Bronchiectasis, uncomplicated: Secondary | ICD-10-CM | POA: Diagnosis not present

## 2018-09-09 NOTE — Telephone Encounter (Signed)
[place] for verbals as needed

## 2018-09-09 NOTE — Telephone Encounter (Signed)
[name] with [facility] calling back and states that she reported on the wrong patient. States that she does not need speech therapy orders and would like physical therapy evaluation for 2x a week for 3 weeks.  CB#: [phone]

## 2018-09-09 NOTE — Telephone Encounter (Signed)
Copied from [place] [id number]. Topic: Quick Communication - Home Health Verbal Orders >> Sep 09, 2018 12:25 PM Rayann Heman wrote: Caller/Agency: amber/[facility] Callback Number: [phone] Requesting PT evaluation for Speech therapy cognition and swallowing  Frequency: PT 2x2 1x2

## 2018-09-09 NOTE — Telephone Encounter (Signed)
Notified amber w/MD response.Marland KitchenJohny Aguirre

## 2018-09-10 ENCOUNTER — Telehealth: Payer: Self-pay | Admitting: Internal Medicine

## 2018-09-10 NOTE — Telephone Encounter (Signed)
Verbal orders given  

## 2018-09-10 NOTE — Telephone Encounter (Signed)
Copied from [place] [id number]. Topic: Quick Communication - Home Health Verbal Orders >> Sep 10, 2018 10:56 AM Berneta Levins wrote: Caller/Agency: Wells Guiles from [facility] Number: [phone], OK to leave a message Requesting OT/PT/Skilled Nursing/Social Work: skilled nursing Frequency: one week nine

## 2018-09-12 ENCOUNTER — Telehealth: Payer: Self-pay | Admitting: Internal Medicine

## 2018-09-12 NOTE — Telephone Encounter (Signed)
Noted  

## 2018-09-12 NOTE — Telephone Encounter (Signed)
Copied from [place] [phone]. Topic: Quick Communication - See Telephone Encounter >> Sep 12, 2018  9:58 AM Burchel, Abbi R wrote: CRM for notification. See Telephone encounter for: 09/12/18.  Junius Argyle, [place]  Romelle Starcher states that pt is moving to [place] with her daughter, and would like to continue therapy with the [facility] instead of the [facility]. Pt will not receive a visit from PT today, but will start next week will with the new PT in [place].

## 2018-09-15 DIAGNOSIS — Z86711 Personal history of pulmonary embolism: Secondary | ICD-10-CM | POA: Diagnosis not present

## 2018-09-15 DIAGNOSIS — Z7901 Long term (current) use of anticoagulants: Secondary | ICD-10-CM | POA: Diagnosis not present

## 2018-09-15 DIAGNOSIS — S22080D Wedge compression fracture of T11-T12 vertebra, subsequent encounter for fracture with routine healing: Secondary | ICD-10-CM | POA: Diagnosis not present

## 2018-09-15 DIAGNOSIS — J441 Chronic obstructive pulmonary disease with (acute) exacerbation: Secondary | ICD-10-CM | POA: Diagnosis not present

## 2018-09-15 DIAGNOSIS — J479 Bronchiectasis, uncomplicated: Secondary | ICD-10-CM | POA: Diagnosis not present

## 2018-09-15 DIAGNOSIS — Z9981 Dependence on supplemental oxygen: Secondary | ICD-10-CM | POA: Diagnosis not present

## 2018-09-15 DIAGNOSIS — J440 Chronic obstructive pulmonary disease with acute lower respiratory infection: Secondary | ICD-10-CM | POA: Diagnosis not present

## 2018-09-15 DIAGNOSIS — I10 Essential (primary) hypertension: Secondary | ICD-10-CM

## 2018-09-15 DIAGNOSIS — J189 Pneumonia, unspecified organism: Secondary | ICD-10-CM | POA: Diagnosis not present

## 2018-09-15 DIAGNOSIS — S22069D Unspecified fracture of T7-T8 vertebra, subsequent encounter for fracture with routine healing: Secondary | ICD-10-CM | POA: Diagnosis not present

## 2018-09-17 DIAGNOSIS — J441 Chronic obstructive pulmonary disease with (acute) exacerbation: Secondary | ICD-10-CM | POA: Diagnosis not present

## 2018-09-17 DIAGNOSIS — J440 Chronic obstructive pulmonary disease with acute lower respiratory infection: Secondary | ICD-10-CM | POA: Diagnosis not present

## 2018-09-17 DIAGNOSIS — S22080D Wedge compression fracture of T11-T12 vertebra, subsequent encounter for fracture with routine healing: Secondary | ICD-10-CM | POA: Diagnosis not present

## 2018-09-17 DIAGNOSIS — J479 Bronchiectasis, uncomplicated: Secondary | ICD-10-CM | POA: Diagnosis not present

## 2018-09-17 DIAGNOSIS — J189 Pneumonia, unspecified organism: Secondary | ICD-10-CM | POA: Diagnosis not present

## 2018-09-17 DIAGNOSIS — S22069D Unspecified fracture of T7-T8 vertebra, subsequent encounter for fracture with routine healing: Secondary | ICD-10-CM | POA: Diagnosis not present

## 2018-09-18 DIAGNOSIS — S22080G Wedge compression fracture of T11-T12 vertebra, subsequent encounter for fracture with delayed healing: Secondary | ICD-10-CM | POA: Diagnosis not present

## 2018-09-18 DIAGNOSIS — S32030A Wedge compression fracture of third lumbar vertebra, initial encounter for closed fracture: Secondary | ICD-10-CM | POA: Diagnosis not present

## 2018-09-19 DIAGNOSIS — S22069D Unspecified fracture of T7-T8 vertebra, subsequent encounter for fracture with routine healing: Secondary | ICD-10-CM | POA: Diagnosis not present

## 2018-09-19 DIAGNOSIS — J479 Bronchiectasis, uncomplicated: Secondary | ICD-10-CM | POA: Diagnosis not present

## 2018-09-19 DIAGNOSIS — J189 Pneumonia, unspecified organism: Secondary | ICD-10-CM | POA: Diagnosis not present

## 2018-09-19 DIAGNOSIS — J440 Chronic obstructive pulmonary disease with acute lower respiratory infection: Secondary | ICD-10-CM | POA: Diagnosis not present

## 2018-09-19 DIAGNOSIS — J441 Chronic obstructive pulmonary disease with (acute) exacerbation: Secondary | ICD-10-CM | POA: Diagnosis not present

## 2018-09-19 DIAGNOSIS — S22080D Wedge compression fracture of T11-T12 vertebra, subsequent encounter for fracture with routine healing: Secondary | ICD-10-CM | POA: Diagnosis not present

## 2018-09-23 DIAGNOSIS — J441 Chronic obstructive pulmonary disease with (acute) exacerbation: Secondary | ICD-10-CM | POA: Diagnosis not present

## 2018-09-23 DIAGNOSIS — J479 Bronchiectasis, uncomplicated: Secondary | ICD-10-CM | POA: Diagnosis not present

## 2018-09-23 DIAGNOSIS — S22080D Wedge compression fracture of T11-T12 vertebra, subsequent encounter for fracture with routine healing: Secondary | ICD-10-CM | POA: Diagnosis not present

## 2018-09-23 DIAGNOSIS — S22069D Unspecified fracture of T7-T8 vertebra, subsequent encounter for fracture with routine healing: Secondary | ICD-10-CM | POA: Diagnosis not present

## 2018-09-23 DIAGNOSIS — J440 Chronic obstructive pulmonary disease with acute lower respiratory infection: Secondary | ICD-10-CM | POA: Diagnosis not present

## 2018-09-23 DIAGNOSIS — J189 Pneumonia, unspecified organism: Secondary | ICD-10-CM | POA: Diagnosis not present

## 2018-09-24 DIAGNOSIS — J189 Pneumonia, unspecified organism: Secondary | ICD-10-CM | POA: Diagnosis not present

## 2018-09-24 DIAGNOSIS — S22080D Wedge compression fracture of T11-T12 vertebra, subsequent encounter for fracture with routine healing: Secondary | ICD-10-CM | POA: Diagnosis not present

## 2018-09-24 DIAGNOSIS — J440 Chronic obstructive pulmonary disease with acute lower respiratory infection: Secondary | ICD-10-CM | POA: Diagnosis not present

## 2018-09-24 DIAGNOSIS — J441 Chronic obstructive pulmonary disease with (acute) exacerbation: Secondary | ICD-10-CM | POA: Diagnosis not present

## 2018-09-24 DIAGNOSIS — S22069D Unspecified fracture of T7-T8 vertebra, subsequent encounter for fracture with routine healing: Secondary | ICD-10-CM | POA: Diagnosis not present

## 2018-09-24 DIAGNOSIS — J479 Bronchiectasis, uncomplicated: Secondary | ICD-10-CM | POA: Diagnosis not present

## 2018-09-25 ENCOUNTER — Telehealth: Payer: Self-pay | Admitting: Internal Medicine

## 2018-09-25 DIAGNOSIS — S22069D Unspecified fracture of T7-T8 vertebra, subsequent encounter for fracture with routine healing: Secondary | ICD-10-CM | POA: Diagnosis not present

## 2018-09-25 DIAGNOSIS — S22080D Wedge compression fracture of T11-T12 vertebra, subsequent encounter for fracture with routine healing: Secondary | ICD-10-CM | POA: Diagnosis not present

## 2018-09-25 DIAGNOSIS — J441 Chronic obstructive pulmonary disease with (acute) exacerbation: Secondary | ICD-10-CM | POA: Diagnosis not present

## 2018-09-25 DIAGNOSIS — J479 Bronchiectasis, uncomplicated: Secondary | ICD-10-CM | POA: Diagnosis not present

## 2018-09-25 DIAGNOSIS — J189 Pneumonia, unspecified organism: Secondary | ICD-10-CM | POA: Diagnosis not present

## 2018-09-25 DIAGNOSIS — J440 Chronic obstructive pulmonary disease with acute lower respiratory infection: Secondary | ICD-10-CM | POA: Diagnosis not present

## 2018-09-25 NOTE — Telephone Encounter (Signed)
Copied from [place] [phone]. Topic: Quick Communication - Home Health Verbal Orders >> Sep 25, 2018  1:24 PM Hewitt Shorts wrote: Caller/Agency: [facility] Number: [phone] Requesting OT/PT/Skilled Nursing/Social Work: physical therapy Frequency: 1 time a week 1 week, and 2 times a week for 1 week

## 2018-09-25 NOTE — Telephone Encounter (Signed)
Verbal orders given  

## 2018-09-29 ENCOUNTER — Other Ambulatory Visit (INDEPENDENT_AMBULATORY_CARE_PROVIDER_SITE_OTHER): Payer: Medicare Other

## 2018-09-29 ENCOUNTER — Ambulatory Visit (INDEPENDENT_AMBULATORY_CARE_PROVIDER_SITE_OTHER): Payer: Medicare Other | Admitting: Internal Medicine

## 2018-09-29 ENCOUNTER — Telehealth: Payer: Self-pay | Admitting: Internal Medicine

## 2018-09-29 ENCOUNTER — Encounter: Payer: Self-pay | Admitting: Internal Medicine

## 2018-09-29 ENCOUNTER — Other Ambulatory Visit: Payer: Self-pay | Admitting: Internal Medicine

## 2018-09-29 VITALS — BP 146/84 | HR 98 | Temp 98.1°F | Ht 62.0 in | Wt 136.0 lb

## 2018-09-29 DIAGNOSIS — J189 Pneumonia, unspecified organism: Secondary | ICD-10-CM

## 2018-09-29 DIAGNOSIS — Z7901 Long term (current) use of anticoagulants: Secondary | ICD-10-CM

## 2018-09-29 DIAGNOSIS — J449 Chronic obstructive pulmonary disease, unspecified: Secondary | ICD-10-CM | POA: Diagnosis not present

## 2018-09-29 DIAGNOSIS — I10 Essential (primary) hypertension: Secondary | ICD-10-CM

## 2018-09-29 DIAGNOSIS — S22080D Wedge compression fracture of T11-T12 vertebra, subsequent encounter for fracture with routine healing: Secondary | ICD-10-CM | POA: Diagnosis not present

## 2018-09-29 DIAGNOSIS — S32030S Wedge compression fracture of third lumbar vertebra, sequela: Secondary | ICD-10-CM | POA: Diagnosis not present

## 2018-09-29 DIAGNOSIS — J181 Lobar pneumonia, unspecified organism: Secondary | ICD-10-CM

## 2018-09-29 LAB — PROTIME-INR
INR: 6.4 ratio (ref 0.8–1.0)
Prothrombin Time: 71.7 s (ref 9.6–13.1)

## 2018-09-29 MED ORDER — CYCLOBENZAPRINE HCL 10 MG PO TABS: 10.0000 mg | ORAL_TABLET | Freq: Three times a day (TID) | ORAL | 5 refills | Status: AC | PRN

## 2018-09-29 NOTE — Telephone Encounter (Signed)
Sorry, this was not obvious as my note in not yet complete, but pt requested this INR to be done at the [facility] today, with patient request to me to forward to [facility] for disposition;  Pt stated [name] is aware

## 2018-09-29 NOTE — Progress Notes (Signed)
Subjective:    Patient ID: [name], female    DOB: 10/31/1929, 84 y.o.   MRN: [id number]  HPI  Here to f/u recent hospn, ? Aspirate pna per NS post lumbar procedure; Pt continues to have recurring LBP without change in severity, bowel or bladder change, fever, wt loss,  worsening LE pain/numbness/weakness, gait change or falls.  Pt denies chest pain, increased sob or doe, wheezing, orthopnea, PND, increased LE swelling, palpitations, dizziness or syncope.  Walks with walker, getting Home PT now, soon to graduate from walker to cane by report.  Denies urinary symptoms such as dysuria, frequency, urgency, flank pain, hematuria or n/v, fever, chills.   Pt denies fever, wt loss, night sweats, or other constitutional symptoms.  Needs INR done today as coumadin clinic not available Wt Readings from Last 3 Encounters:  09/29/18 136 lb (61.7 kg)  09/08/18 134 lb 9.6 oz (61.1 kg)  09/01/18 140 lb (63.5 kg)   Past Medical History:  Diagnosis Date  . ALLERGIC RHINITIS   . ANXIETY   . Asthma   . Bronchiectasis 12/11/2011   CT chest dx  . CAROTID ARTERY STENOSIS, RIGHT   . Chronic idiopathic constipation   . Chronic rhinitis   . COMMON MIGRAINE   . COPD   . CORONARY ARTERY DISEASE   . CYST/PSEUDOCYST, PANCREAS   . DEPRESSION   . DIVERTICULOSIS, COLON   . DIZZINESS, CHRONIC   . Dyspnea    on exertion  . Esophageal stricture   . GERD   . HYPERLIPIDEMIA   . HYPERTENSION   . Internal hemorrhoids with complication   . OSTEOPOROSIS   . OVERACTIVE BLADDER   . Pulmonary embolism ([place]) 2008  . PULMONARY HYPERTENSION, SECONDARY   . Stroke ([place])   . Tubular adenoma of colon 10/2013  . Unspecified hearing loss    Past Surgical History:  Procedure Laterality Date  . ABDOMINAL HYSTERECTOMY  1965  . APPENDECTOMY  1963  . CHOLECYSTECTOMY  1963  . COLONOSCOPY N/A 10/19/2013   Procedure: COLONOSCOPY;  Surgeon: Ladene Artist, MD;  Location: [facility];  Service: Endoscopy;  Laterality:  N/A;  . EGD  1999  . KYPHOPLASTY N/A 08/20/2018   Procedure: Lumbar three KYPHOPLASTY;  Surgeon: Ashok Pall, MD;  Location: [facility];  Service: Neurosurgery;  Laterality: N/A;  . PANCREATIC CYST DRAINAGE     x 2  . ROOT CANAL    . s/p sinus surgury    . TONSILLECTOMY      reports that she has never smoked. She has never used smokeless tobacco. She reports that she does not drink alcohol or use drugs. family history includes Breast cancer in her daughter; Emphysema in her father; Heart attack in her mother; Heart disease in her mother; Lymphoma in her sister; Stroke in her father. Allergies  Allergen Reactions  . Raloxifene Other (See Comments)    REACTION: risk of recurrent stroke  . Sulfonamide Derivatives Anaphylaxis    REACTION: tongue swells  . Amoxicillin-Pot Clavulanate Diarrhea    REACTION: diarrhea Has patient had a PCN reaction causing immediate rash, facial/tongue/throat swelling, SOB or lightheadedness with hypotension: unknown Has patient had a PCN reaction causing severe rash involving mucus membranes or skin necrosis: unknown Has patient had a PCN reaction that required hospitalization : unknown Has patient had a PCN reaction occurring within the last 10 years: yes   pt states she should be able to take penicillin, but cant remember actually taking it   .  Ciclesonide Other (See Comments)    REACTION: bad smell  . Ciprofloxacin Other (See Comments)    dizziness   Current Outpatient Medications on File Prior to Visit  Medication Sig Dispense Refill  . acetaminophen (TYLENOL) 500 MG tablet Take 1,000 mg by mouth every 6 (six) hours as needed for moderate pain.    [name] albuterol (ACCUNEB) 0.63 MG/3ML nebulizer solution Take 3 mLs (0.63 mg total) by nebulization every 6 (six) hours as needed for wheezing. 75 mL 12  . budesonide (RHINOCORT ALLERGY) 32 MCG/ACT nasal spray Place 1 spray into both nostrils daily.     . Digestive Enzymes (PAPAYA AND ENZYMES) CHEW Chew 2 tablets  by mouth 3 (three) times daily with meals as needed (digestion of big meals).     . furosemide (LASIX) 20 MG tablet Take 1 tablet (20 mg total) by mouth daily. 5 tablet 0  . HYDROcodone-acetaminophen (NORCO) 7.5-325 MG tablet Take 1 tablet by mouth every 6 (six) hours as needed for moderate pain. 120 tablet 0  . HYDROcodone-acetaminophen (NORCO/VICODIN) 5-325 MG tablet Take 1 tablet by mouth every 6 (six) hours as needed for moderate pain. 30 tablet 0  . loratadine (CLARITIN) 10 MG tablet Take 10 mg by mouth daily as needed for allergies.     [name] losartan (COZAAR) 100 MG tablet TAKE 1 TABLET EVERY DAY 90 tablet 1  . Multiple Vitamins-Minerals (MACUVITE) TABS Take 1 tablet by mouth daily.      . Probiotic Product (PROBIOTIC DAILY PO) Take 1 tablet by mouth daily.    . promethazine (PHENERGAN) 25 MG tablet Take 1 tablet (25 mg total) by mouth every 6 (six) hours as needed for nausea or vomiting. 30 tablet 1  . sertraline (ZOLOFT) 50 MG tablet Take 1 tablet (50 mg total) by mouth daily. 90 tablet 3  . VENTOLIN HFA 108 (90 Base) MCG/ACT inhaler INHALE 2 PUFFS INTO THE LUNGS EVERY 6 HOURS AS NEEDED FOR WHEEZING OR SHORTNESS OF BREATH. (Patient taking differently: Inhale 2 puffs into the lungs every 6 (six) hours as needed. ) 18 Inhaler 0   No current facility-administered medications on file prior to visit.    Review of Systems  Constitutional: Negative for other unusual diaphoresis or sweats HENT: Negative for ear discharge or swelling Eyes: Negative for other worsening visual disturbances Respiratory: Negative for stridor or other swelling  Gastrointestinal: Negative for worsening distension or other blood Genitourinary: Negative for retention or other urinary change Musculoskeletal: Negative for other MSK pain or swelling Skin: Negative for color change or other new lesions Neurological: Negative for worsening tremors and other numbness  Psychiatric/Behavioral: Negative for worsening agitation or  other fatigue All other system neg per pt    Objective:   Physical Exam BP (!) 146/84   Pulse 98   Temp 98.1 F (36.7 C) (Oral)   Ht 5\' 2"  (1.575 m)   Wt 136 lb (61.7 kg)   SpO2 90%   BMI 24.87 kg/m  VS noted,  Constitutional: Pt appears in NAD HENT: Head: NCAT.  Right Ear: External ear normal.  Left Ear: External ear normal.  Eyes: . Pupils are equal, round, and reactive to light. Conjunctivae and EOM are normal Nose: without d/c or deformity Neck: Neck supple. Gross normal ROM Cardiovascular: Normal rate and regular rhythm.   Pulmonary/Chest: Effort normal and breath sounds without rales or wheezing.  Abd:  Soft, NT, ND, + BS, no organomegaly Neurological: Pt is alert. At baseline orientation, motor grossly intact Skin: Skin  is warm. No rashes, other new lesions, no LE edema Psychiatric: Pt behavior is normal without agitation  No other exam findings  Lab Results  Component Value Date   WBC 13.9 (H) 09/01/2018   HGB 15.1 (H) 09/01/2018   HCT 45.2 09/01/2018   PLT 308.0 09/01/2018   GLUCOSE 120 (H) 09/01/2018   CHOL 207 (H) 10/15/2017   TRIG 100.0 10/15/2017   HDL 71.20 10/15/2017   LDLDIRECT 122.7 11/10/2012   LDLCALC 116 (H) 10/15/2017   ALT 48 (H) 09/01/2018   AST 23 09/01/2018   NA 131 (L) 09/01/2018   K 3.8 09/01/2018   CL 91 (L) 09/01/2018   CREATININE 0.54 09/01/2018   BUN 19 09/01/2018   CO2 30 09/01/2018   TSH 2.44 10/15/2017   INR 3.0 09/08/2018   HGBA1C 5.9 10/15/2017       Assessment & Plan:

## 2018-09-29 NOTE — Patient Instructions (Signed)
Please continue all other medications as before, and refills have been done if requested.  Please have the pharmacy call with any other refills you may need.  Please continue your efforts at being more active, low cholesterol diet, and weight control.  You are otherwise up to date with prevention measures today.  Please keep your appointments with your specialists as you may have planned  Please go to the LAB in the Basement (turn left off the elevator) for the tests to be done today  You will be contacted by phone if any changes need to be made immediately.  Otherwise, you will receive a letter about your results with an explanation, but please check with MyChart first.  Please remember to sign up for MyChart if you have not done so, as this will be important to you in the future with finding out test results, communicating by private email, and scheduling acute appointments online when needed.  Please return in 3 months, or sooner if needed 

## 2018-09-29 NOTE — Telephone Encounter (Signed)
Attempted to all Santiago Glad at number provided but no answer at this time.  On call physician, Dr. Sharlet Salina notified of critical results of PT-71.7 and INR-6.4. Results available in Epic.

## 2018-09-29 NOTE — Telephone Encounter (Signed)
Copied from [place]. Topic: General - Other >> Sep 29, 2018  5:32 PM Alanda Slim E wrote: Reason for CRM: Santiago Glad from [facility] called and stated she would send a fax over for the Pt and that the Pt was critical and the Protime was 71.7 and the INR is 6.4 / please advise asap

## 2018-09-29 NOTE — Telephone Encounter (Addendum)
Result received for PT/INR 6.4 and will route to PCP for dosing tomorrow, would recommend skip dosing Tuesday and then resume 1/2 pill daily including Friday for overall reduction dosing ~10%.

## 2018-09-30 ENCOUNTER — Telehealth: Payer: Self-pay

## 2018-09-30 DIAGNOSIS — S22080D Wedge compression fracture of T11-T12 vertebra, subsequent encounter for fracture with routine healing: Secondary | ICD-10-CM | POA: Diagnosis not present

## 2018-09-30 DIAGNOSIS — J479 Bronchiectasis, uncomplicated: Secondary | ICD-10-CM | POA: Diagnosis not present

## 2018-09-30 DIAGNOSIS — S22069D Unspecified fracture of T7-T8 vertebra, subsequent encounter for fracture with routine healing: Secondary | ICD-10-CM | POA: Diagnosis not present

## 2018-09-30 DIAGNOSIS — J189 Pneumonia, unspecified organism: Secondary | ICD-10-CM | POA: Diagnosis not present

## 2018-09-30 DIAGNOSIS — J440 Chronic obstructive pulmonary disease with acute lower respiratory infection: Secondary | ICD-10-CM | POA: Diagnosis not present

## 2018-09-30 DIAGNOSIS — J441 Chronic obstructive pulmonary disease with (acute) exacerbation: Secondary | ICD-10-CM | POA: Diagnosis not present

## 2018-09-30 NOTE — Assessment & Plan Note (Signed)
S/p lumbar procedure per NS

## 2018-09-30 NOTE — Assessment & Plan Note (Signed)
stable overall by history and exam, recent data reviewed with pt, and pt to continue medical treatment as before,  to f/u any worsening symptoms or concerns  

## 2018-09-30 NOTE — Assessment & Plan Note (Signed)
Also for INR today  Note:  Total time for pt hx, exam, review of record with pt in the room, determination of diagnoses and plan for further eval and tx is > 40 min, with over 50% spent in coordination and counseling of patient including the differential dx, tx, further evaluation and other management of anticoagulant use, multiple compression fx, COPD, pneumonia and HTN

## 2018-09-30 NOTE — Assessment & Plan Note (Signed)
New recent worsening, plans to f/u with NS for this as well

## 2018-09-30 NOTE — Telephone Encounter (Signed)
Called pt, LVM with all details below and instructions.

## 2018-09-30 NOTE — Telephone Encounter (Signed)
-----   Message from [name], MD sent at 09/30/2018 10:10 AM EST ----- I am informed [facility] is not available until Friday dec 27 back in the office  Staff to contact pt - INR quite high, and shoud HOLD all coumadin for today and tomorrow, take HALF dose she would normally take on Thursday, then f/u INR with cindy on Friday dec 27 if possible  Thanks; Lenore Moyano to please inform pt and assist with appt for friday

## 2018-10-03 ENCOUNTER — Telehealth: Payer: Self-pay | Admitting: General Practice

## 2018-10-03 ENCOUNTER — Ambulatory Visit (INDEPENDENT_AMBULATORY_CARE_PROVIDER_SITE_OTHER): Payer: Medicare Other | Admitting: General Practice

## 2018-10-03 DIAGNOSIS — Z86711 Personal history of pulmonary embolism: Secondary | ICD-10-CM | POA: Diagnosis not present

## 2018-10-03 DIAGNOSIS — Z7901 Long term (current) use of anticoagulants: Secondary | ICD-10-CM

## 2018-10-03 NOTE — Telephone Encounter (Signed)
VO given to [facility] in [place], [place] to check patient's INR on Monday, 12/30.  Results are to be called to [facility], RN @ [phone].

## 2018-10-03 NOTE — Patient Instructions (Addendum)
Pre visit review using our clinic review tool, if applicable. No additional management support is needed unless otherwise documented below in the visit note.  Hold coumadin through Monday and then change dosage and take 1/2 tablet daily.  Re-check on Monday, 12/30.  Orders faxed to [facility] to check INR.  INR was resulted by lab on 12/23.  Instructed patient to go to ER if any unusual bleeding occurs.

## 2018-10-03 NOTE — Progress Notes (Signed)
Medical treatment/procedure(s) were performed by non-physician practitioner and as supervising physician I was immediately available for consultation/collaboration. I agree with above. Elizabeth A Crawford, MD  

## 2018-10-06 ENCOUNTER — Ambulatory Visit (INDEPENDENT_AMBULATORY_CARE_PROVIDER_SITE_OTHER): Payer: Medicare Other | Admitting: General Practice

## 2018-10-06 ENCOUNTER — Telehealth: Payer: Self-pay | Admitting: Internal Medicine

## 2018-10-06 DIAGNOSIS — Z7901 Long term (current) use of anticoagulants: Secondary | ICD-10-CM

## 2018-10-06 DIAGNOSIS — S22069D Unspecified fracture of T7-T8 vertebra, subsequent encounter for fracture with routine healing: Secondary | ICD-10-CM | POA: Diagnosis not present

## 2018-10-06 DIAGNOSIS — Z86711 Personal history of pulmonary embolism: Secondary | ICD-10-CM | POA: Diagnosis not present

## 2018-10-06 DIAGNOSIS — J189 Pneumonia, unspecified organism: Secondary | ICD-10-CM | POA: Diagnosis not present

## 2018-10-06 DIAGNOSIS — S22080D Wedge compression fracture of T11-T12 vertebra, subsequent encounter for fracture with routine healing: Secondary | ICD-10-CM | POA: Diagnosis not present

## 2018-10-06 DIAGNOSIS — J440 Chronic obstructive pulmonary disease with acute lower respiratory infection: Secondary | ICD-10-CM | POA: Diagnosis not present

## 2018-10-06 DIAGNOSIS — J479 Bronchiectasis, uncomplicated: Secondary | ICD-10-CM | POA: Diagnosis not present

## 2018-10-06 DIAGNOSIS — J441 Chronic obstructive pulmonary disease with (acute) exacerbation: Secondary | ICD-10-CM | POA: Diagnosis not present

## 2018-10-06 LAB — POCT INR: INR: 1.4 — AB (ref 2.0–3.0)

## 2018-10-06 MED ORDER — ALBUTEROL SULFATE HFA 108 (90 BASE) MCG/ACT IN AERS: INHALATION_SPRAY | RESPIRATORY_TRACT | 0 refills | Status: AC

## 2018-10-06 MED ORDER — ALBUTEROL SULFATE 0.63 MG/3ML IN NEBU: 1.0000 | INHALATION_SOLUTION | Freq: Four times a day (QID) | RESPIRATORY_TRACT | 12 refills | Status: AC | PRN

## 2018-10-06 NOTE — Telephone Encounter (Signed)
Pt left me a vm requesting her albuterol inhaler along with another kind of inhaler.   She is staying with her daughter in [place] and would like them to be [facility] to [facility] on [address] In [place].   Can you please give her a call at [phone] (# at daughters house)  to verify which medications she is needing.   thanks

## 2018-10-06 NOTE — Patient Instructions (Signed)
Pre visit review using our clinic review tool, if applicable. No additional management support is needed unless otherwise documented below in the visit note.  Take 5 mg today and tomorrow (12/3 and 12/4) and on Wednesday continue taking 1/2 tablet (2.5 mg) daily.  Re-check in 1 week.    Orders given to Mardene Celeste, RN @ [facility] while in patient's home.

## 2018-10-06 NOTE — Telephone Encounter (Signed)
Spoke with patient to get clarification on which medications were needing refills. I have sent in the ventolin inhaler as well has her [facility]erol neb solution [facility] [place].

## 2018-10-07 DIAGNOSIS — J479 Bronchiectasis, uncomplicated: Secondary | ICD-10-CM | POA: Diagnosis not present

## 2018-10-07 DIAGNOSIS — J440 Chronic obstructive pulmonary disease with acute lower respiratory infection: Secondary | ICD-10-CM | POA: Diagnosis not present

## 2018-10-07 DIAGNOSIS — S22080D Wedge compression fracture of T11-T12 vertebra, subsequent encounter for fracture with routine healing: Secondary | ICD-10-CM | POA: Diagnosis not present

## 2018-10-07 DIAGNOSIS — S22069D Unspecified fracture of T7-T8 vertebra, subsequent encounter for fracture with routine healing: Secondary | ICD-10-CM | POA: Diagnosis not present

## 2018-10-07 DIAGNOSIS — J441 Chronic obstructive pulmonary disease with (acute) exacerbation: Secondary | ICD-10-CM | POA: Diagnosis not present

## 2018-10-07 DIAGNOSIS — J189 Pneumonia, unspecified organism: Secondary | ICD-10-CM | POA: Diagnosis not present

## 2018-10-09 DIAGNOSIS — J441 Chronic obstructive pulmonary disease with (acute) exacerbation: Secondary | ICD-10-CM | POA: Diagnosis not present

## 2018-10-09 DIAGNOSIS — S22069D Unspecified fracture of T7-T8 vertebra, subsequent encounter for fracture with routine healing: Secondary | ICD-10-CM | POA: Diagnosis not present

## 2018-10-09 DIAGNOSIS — J479 Bronchiectasis, uncomplicated: Secondary | ICD-10-CM | POA: Diagnosis not present

## 2018-10-09 DIAGNOSIS — J189 Pneumonia, unspecified organism: Secondary | ICD-10-CM | POA: Diagnosis not present

## 2018-10-09 DIAGNOSIS — J440 Chronic obstructive pulmonary disease with acute lower respiratory infection: Secondary | ICD-10-CM | POA: Diagnosis not present

## 2018-10-09 DIAGNOSIS — S22080D Wedge compression fracture of T11-T12 vertebra, subsequent encounter for fracture with routine healing: Secondary | ICD-10-CM | POA: Diagnosis not present

## 2018-10-10 ENCOUNTER — Ambulatory Visit (INDEPENDENT_AMBULATORY_CARE_PROVIDER_SITE_OTHER): Payer: Medicare Other | Admitting: General Practice

## 2018-10-10 DIAGNOSIS — Z7901 Long term (current) use of anticoagulants: Secondary | ICD-10-CM | POA: Diagnosis not present

## 2018-10-10 DIAGNOSIS — Z86711 Personal history of pulmonary embolism: Secondary | ICD-10-CM

## 2018-10-10 LAB — POCT INR: INR: 2.9 (ref 2.0–3.0)

## 2018-10-10 NOTE — Patient Instructions (Addendum)
Pre visit review using our clinic review tool, if applicable. No additional management support is needed unless otherwise documented below in the visit note.  Change dosage and take 1/2 tablet (2.5 mg) daily except nothing on Wednesdays. Orders given to Mardene Celeste, RN @ [facility] to check INR on Monday 1/6.

## 2018-10-14 DIAGNOSIS — J440 Chronic obstructive pulmonary disease with acute lower respiratory infection: Secondary | ICD-10-CM | POA: Diagnosis not present

## 2018-10-14 DIAGNOSIS — S22080D Wedge compression fracture of T11-T12 vertebra, subsequent encounter for fracture with routine healing: Secondary | ICD-10-CM | POA: Diagnosis not present

## 2018-10-14 DIAGNOSIS — J189 Pneumonia, unspecified organism: Secondary | ICD-10-CM | POA: Diagnosis not present

## 2018-10-14 DIAGNOSIS — S22069D Unspecified fracture of T7-T8 vertebra, subsequent encounter for fracture with routine healing: Secondary | ICD-10-CM | POA: Diagnosis not present

## 2018-10-14 DIAGNOSIS — J479 Bronchiectasis, uncomplicated: Secondary | ICD-10-CM | POA: Diagnosis not present

## 2018-10-14 DIAGNOSIS — J441 Chronic obstructive pulmonary disease with (acute) exacerbation: Secondary | ICD-10-CM | POA: Diagnosis not present

## 2018-10-14 NOTE — Progress Notes (Signed)
Resp sputum culture showing normal growth. Nothing further is needed. No new recs.  Wyn Quaker FNP

## 2018-10-15 ENCOUNTER — Ambulatory Visit (INDEPENDENT_AMBULATORY_CARE_PROVIDER_SITE_OTHER): Payer: Medicare Other | Admitting: General Practice

## 2018-10-15 DIAGNOSIS — Z7901 Long term (current) use of anticoagulants: Secondary | ICD-10-CM | POA: Diagnosis not present

## 2018-10-15 DIAGNOSIS — J441 Chronic obstructive pulmonary disease with (acute) exacerbation: Secondary | ICD-10-CM | POA: Diagnosis not present

## 2018-10-15 DIAGNOSIS — S22069D Unspecified fracture of T7-T8 vertebra, subsequent encounter for fracture with routine healing: Secondary | ICD-10-CM | POA: Diagnosis not present

## 2018-10-15 DIAGNOSIS — Z86711 Personal history of pulmonary embolism: Secondary | ICD-10-CM | POA: Diagnosis not present

## 2018-10-15 DIAGNOSIS — S22080D Wedge compression fracture of T11-T12 vertebra, subsequent encounter for fracture with routine healing: Secondary | ICD-10-CM | POA: Diagnosis not present

## 2018-10-15 DIAGNOSIS — J440 Chronic obstructive pulmonary disease with acute lower respiratory infection: Secondary | ICD-10-CM | POA: Diagnosis not present

## 2018-10-15 DIAGNOSIS — J189 Pneumonia, unspecified organism: Secondary | ICD-10-CM | POA: Diagnosis not present

## 2018-10-15 DIAGNOSIS — J479 Bronchiectasis, uncomplicated: Secondary | ICD-10-CM | POA: Diagnosis not present

## 2018-10-15 LAB — POCT INR: INR: 3.1 — AB (ref 2.0–3.0)

## 2018-10-15 NOTE — Progress Notes (Signed)
I have reviewed and agree with this plan  

## 2018-10-15 NOTE — Patient Instructions (Signed)
Pre visit review using our clinic review tool, if applicable. No additional management support is needed unless otherwise documented below in the visit note.  Hold dosage today and then continue to take 1/2 tablet (2.5 mg) daily except nothing on Wednesdays. Patient dc'd from [facility] today.

## 2018-10-16 ENCOUNTER — Telehealth: Payer: Self-pay | Admitting: Emergency Medicine

## 2018-10-16 DIAGNOSIS — M5127 Other intervertebral disc displacement, lumbosacral region: Secondary | ICD-10-CM | POA: Diagnosis not present

## 2018-10-16 NOTE — Telephone Encounter (Signed)
Pt's daughter, Ulanda Edison) is aware of results and voiced her understanding. Nothing further is needed.  Lauraine Rinne, NP  Amado Coe, RN        Resp sputum culture showing normal growth. Nothing further is needed. No new recs.   Wyn Quaker FNP

## 2018-10-27 ENCOUNTER — Ambulatory Visit (INDEPENDENT_AMBULATORY_CARE_PROVIDER_SITE_OTHER): Payer: Medicare Other | Admitting: General Practice

## 2018-10-27 ENCOUNTER — Ambulatory Visit (INDEPENDENT_AMBULATORY_CARE_PROVIDER_SITE_OTHER)
Admission: RE | Admit: 2018-10-27 | Discharge: 2018-10-27 | Disposition: A | Discharge status: Home / Self Care | Payer: Medicare Other | Source: Ambulatory Visit | Attending: Pulmonary Disease | Admitting: Pulmonary Disease

## 2018-10-27 ENCOUNTER — Telehealth: Payer: Self-pay | Admitting: Pulmonary Disease

## 2018-10-27 DIAGNOSIS — J181 Lobar pneumonia, unspecified organism: Secondary | ICD-10-CM | POA: Diagnosis not present

## 2018-10-27 DIAGNOSIS — Z7901 Long term (current) use of anticoagulants: Secondary | ICD-10-CM

## 2018-10-27 DIAGNOSIS — Z86711 Personal history of pulmonary embolism: Secondary | ICD-10-CM

## 2018-10-27 DIAGNOSIS — J189 Pneumonia, unspecified organism: Secondary | ICD-10-CM

## 2018-10-27 LAB — POCT INR: INR: 1.7 — AB (ref 2.0–3.0)

## 2018-10-27 NOTE — Telephone Encounter (Signed)
Received a call report from [place] with [facility]  Pt had Chest CT done 10/27/2018 order by Wyn Quaker, NP  The impression follows:IMPRESSION: 1. Shifting lung parenchymal changes as detailed above may reflect changes of MAC infection contributing to chronic right middle lobe collapse. As there has been a change from 2018 examination, surveillance CT with next follow-up unenhanced chest CT in 6 months may be considered. 2. Progressive loss of height T12 superior endplate now with 55% loss of height and mild retropulsion posterosuperior aspect. Remote T8 compression fracture without change. 3. Enlarged main pulmonary artery consistent with pulmonary hypertension from prior pulmonary embolus disease. 4. Enlarged lower pretracheal lymph node which short axis dimension 1.5 cm versus prior 1.3 cm. 5. Coronary artery calcifications. 6.  Aortic Atherosclerosis (ICD10-I70.0).  These results will be called to the ordering clinician or representative by the Radiologist Assistant, and communication documented in the PACS or zVision Dashboard.   Electronically Signed   By: Genia Del M.D.   On: 10/27/2018 14:09  Pt has appt pending with Aaron Edelman for 11/03/2018 Will forward to him as [facility]

## 2018-10-27 NOTE — Telephone Encounter (Signed)
Noted will discuss results with Dr. [name] tomorrow. Aaron Edelman

## 2018-10-27 NOTE — Patient Instructions (Addendum)
Pre visit review using our clinic review tool, if applicable. No additional management support is needed unless otherwise documented below in the visit note.  Take 1 tablet today and then change dosage and take 1/2 tablet (2.5 mg) daily.  Re-check in 3 to 4 weeks.  1/29 - Take last dose of coumadin until after procedure  2/4 and 2/5  - Take 1 tablet 2/6 - Continue 1/2 tablet daily 2/10 - Check INR

## 2018-10-29 LAB — RESPIRATORY CULTURE OR RESPIRATORY AND SPUTUM CULTURE
MICRO NUMBER:: 91533322
RESULT: NORMAL
SPECIMEN QUALITY: ADEQUATE

## 2018-10-29 LAB — FUNGUS CULTURE W SMEAR
MICRO NUMBER:: 91533321
SMEAR:: NONE SEEN
SPECIMEN QUALITY:: ADEQUATE

## 2018-10-29 NOTE — Progress Notes (Signed)
I have reviewed your CT results have also discussed them with Dr. [name].  Looks very similar to previous CT.  I do not think we have any current changes to your plan of care at this time.  I think we should get you in for an appointment to see Dr. [name] as you have not seen him since 2018.  He has open availability this Friday.  Please discussed with the patient how she is doing and see if she is had continued clinical improvement.  Please schedule her for a follow-up with Dr. [name] we can cancel the follow-up that she has with me.  Wyn Quaker, FNP

## 2018-10-31 ENCOUNTER — Telehealth: Payer: Self-pay | Admitting: Emergency Medicine

## 2018-10-31 NOTE — Telephone Encounter (Signed)
Notes recorded by Lauraine Rinne, NP on 10/29/2018 at 9:03 AM EST I have reviewed your CT results have also discussed them with Dr. [name]. Looks very similar to previous CT. I do not think we have any current changes to your plan of care at this time. I think we should get you in for an appointment to see Dr. [name] as you have not seen him since 2018. He has open availability this Friday.  Please discussed with the patient how she is doing and see if she is had continued clinical improvement. Please schedule her for a follow-up with Dr. [name] we can cancel the follow-up that she has with me.  Tabitha Quaker, FNP  Attempted to call pt but unable to reach. Left message for pt to return call.

## 2018-11-02 NOTE — Progress Notes (Signed)
_0  ID: [name], female    DOB: Aug 09, 1930, 85 y.o.   MRN: [id number]  Chief Complaint  Patient presents with  . Follow-up    CT results, pneumonia follow-up    Referring provider: [name], MD  HPI:  85 year old female never smoker (passive smoke exposure from spouse for 15 years) followed in our office for moderately to severe COPD, bronchiectasis and micronodular disease suspicious for mycobacterial colonization  PMH: Thromboembolic disease Smoker/ Smoking History: Never Smoker.  Passive smoke exposure from spouse for 15 years. Maintenance:  None  Pt of: Dr. [name]  11/03/2018  - Visit   85 year old female never smoker (passive smoke exposure in the home) patient reporting today that she continues to have improvement clinically.  Patient has completed a recent CT chest results are listed below:  10/27/2018-CT chest without contrast- slight change in lung parenchymal changes may indicate MAC infection, continued chronic right middle lobe collapse, similar appearance his previous CT Angio, enlarged pulmonary artery  Patient's last sputum sample did not grow out anything.  Patient's previous sputum sample in 2017 did grow out Pseudomonas.  Patient reports that she continues to be doing okay breathing wise maintained using her flutter valve although she did not use it today she is running late to her appointment.  Patient's main complaint is her back pain which she is currently being managed at [facility] with Dr. Christella Noa.  Patient is planning to have an epidural injection on 11/10/2018.  Patient has not followed up with primary care regarding her Coumadin being held for 5 days which is the requirement.  Patient reports the Coumadin clinic is aware and is okay with this.  On arrival to our office today the patient walked in on room air and dropped to 85%.  Patient reports she has oxygen at home but she wears this as needed.    Tests:  09/29/2018-respiratory  sputum, Mycobacterium, AFB-, fungal- growth of normal flora  10/27/2018-CT chest without contrast- slight change in lung parenchymal changes may indicate MAC infection, continued chronic right middle lobe collapse, similar appearance his previous CT Angio, enlarged pulmonary artery  08/26/2018-CT Angio- negative for acute PE, right-sided bronchiectasis and mucoid impaction with right middle lobe collapse no progression since 2018, may be underlying chronic MAC infection  11/28/11-echocardiogram-LV ejection fraction 55 to 60%, PA P pressure 71  05/17/2016-respiratory sputum culture- moderate Pseudomonas   01/09/2028-pulmonary function test- FVC 2.13 (75% predicted, postbronchodilator ratio 62, FEV1 69, no significant bronchodilator response, slight mid flow reversibility after bronchodilator could indicate small airways disease, DLCO 67 which over corrects with lung volumes to 121  FENO:  No results found for: NITRICOXIDE  PFT: No flowsheet data found.  Imaging: Ct Chest Wo Contrast  Result Date: 10/27/2018 CLINICAL DATA:  85 year old female with persistent right middle lobe pneumonia. Subsequent encounter. EXAM: CT CHEST WITHOUT CONTRAST TECHNIQUE: Multidetector CT imaging of the chest was performed following the standard protocol without IV contrast. COMPARISON:  08/26/2018, 01/16/2017 and 10/25/2015 CT. FINDINGS: Cardiovascular: Heart size top-normal. Coronary artery calcifications. Aortic valve calcification. Calcified thoracic aorta without aneurysm. Prominent main pulmonary artery as previously noted may reflect pulmonary hypertension from prior pulmonary embolus. Cannot evaluate for acute pulmonary emboli as contrast was not administered. Mediastinum/Nodes: Enlarged lower pretracheal lymph node with short axis dimension 1.5 cm previously measuring 1.3 cm. No obvious esophageal or thyroid abnormality. Lungs/Pleura: Persistent right middle lobe varicoid bronchiectasis with right middle lobe  collapse. Narrowing of bronchi extending into this region. Filling defect right  middle lobe bronchus and bronchus intermedius felt more likely to represent mucoid impaction/mucoid material rather than mass given surrounding findings. Shifting pattern of peribronchial thickening and mucoid impaction involving right upper lobe and right lower lobe bronchi. Scattered nodular densities, greater on the right minimally changed from prior exam. Wedge-shaped consolidation posteromedial aspect left upper lobe similar to recent exam although changed from remote exams. Upper Abdomen: Dense breast parenchyma. Limited imaging upper abdomen reveals mild left adrenal gland hyperplasia. Musculoskeletal: Remote T8 compression fracture with anterior wedging with 70% loss of height anteriorly and 40% loss of height posteriorly. Mild kyphosis centered at this level. Progressive loss of height T12 superior endplate now with 91% loss of height and mild retropulsion posterosuperior aspect. IMPRESSION: 1. Shifting lung parenchymal changes as detailed above may reflect changes of MAC infection contributing to chronic right middle lobe collapse. As there has been a change from 2018 examination, surveillance CT with next follow-up unenhanced chest CT in 6 months may be considered. 2. Progressive loss of height T12 superior endplate now with 79% loss of height and mild retropulsion posterosuperior aspect. Remote T8 compression fracture without change. 3. Enlarged main pulmonary artery consistent with pulmonary hypertension from prior pulmonary embolus disease. 4. Enlarged lower pretracheal lymph node which short axis dimension 1.5 cm versus prior 1.3 cm. 5. Coronary artery calcifications. 6.  Aortic Atherosclerosis (ICD10-I70.0). These results will be called to the ordering clinician or representative by the Radiologist Assistant, and communication documented in the PACS or zVision Dashboard. Electronically Signed   By: Genia Del M.D.    On: 10/27/2018 14:09      Specialty Problems      Pulmonary Problems   Allergic rhinitis    Qualifier: Diagnosis of  By: [name] MD, Hunt Oris       Dyspnea    Qualifier: Diagnosis of  By: Melvyn Novas MD, Christena Deem       COPD with acute exacerbation ([place])    Qualifier: Diagnosis of  By: [name] MD, Rose Fillers       Chronic rhinitis    Qualifier: Diagnosis of  By: Melvyn Novas MD, Christena Deem       Bronchiectasis [facility])    Followed in Pulmonary clinic/ [facility]/ [place]      - PFT's 01/09/2008 Mild airflow obstruction no response to B2      Chronic respiratory failure with hypoxia [facility])    Patient Saturations on Room Air at Rest = 87% Cecille Rubin cooper, CMA 08/14/11   - ono  12/25/11 ok on 2lpm> no desat          Acute upper respiratory infection   Wheezing   Cough   CAP (community acquired pneumonia)   Chronic obstructive pulmonary disease ([place])   Pneumonia of right middle lobe due to infectious organism ([place])      Allergies  Allergen Reactions  . Raloxifene Other (See Comments)    REACTION: risk of recurrent stroke  . Sulfonamide Derivatives Anaphylaxis    REACTION: tongue swells  . Amoxicillin-Pot Clavulanate Diarrhea    REACTION: diarrhea Has patient had a PCN reaction causing immediate rash, facial/tongue/throat swelling, SOB or lightheadedness with hypotension: unknown Has patient had a PCN reaction causing severe rash involving mucus membranes or skin necrosis: unknown Has patient had a PCN reaction that required hospitalization : unknown Has patient had a PCN reaction occurring within the last 10 years: yes   pt states she should be able to take penicillin, but cant remember actually taking it   .  Ciclesonide Other (See Comments)    REACTION: bad smell  . Ciprofloxacin Other (See Comments)    dizziness    Immunization History  Administered Date(s) Administered  . Influenza Split 07/11/2011, 09/07/2012, 07/30/2013  . Influenza Whole 07/05/2008, 08/02/2009  .  Influenza, High Dose Seasonal PF 07/11/2016, 07/15/2017, 06/24/2018  . Influenza,inj,Quad PF,6+ Mos 07/27/2014  . Influenza-Unspecified 08/09/2015  . Pneumococcal Conjugate-13 08/28/2013  . Pneumococcal Polysaccharide-23 04/06/2009  . Td 04/06/2009    Past Medical History:  Diagnosis Date  . ALLERGIC RHINITIS   . ANXIETY   . Asthma   . Bronchiectasis 12/11/2011   CT chest dx  . CAROTID ARTERY STENOSIS, RIGHT   . Chronic idiopathic constipation   . Chronic rhinitis   . COMMON MIGRAINE   . COPD   . CORONARY ARTERY DISEASE   . CYST/PSEUDOCYST, PANCREAS   . DEPRESSION   . DIVERTICULOSIS, COLON   . DIZZINESS, CHRONIC   . Dyspnea    on exertion  . Esophageal stricture   . GERD   . HYPERLIPIDEMIA   . HYPERTENSION   . Internal hemorrhoids with complication   . OSTEOPOROSIS   . OVERACTIVE BLADDER   . Pulmonary embolism ([place]) 2008  . PULMONARY HYPERTENSION, SECONDARY   . Stroke ([place])   . Tubular adenoma of colon 10/2013  . Unspecified hearing loss     Tobacco History: Social History   Tobacco Use  Smoking Status Never Smoker  Smokeless Tobacco Never Used  Tobacco Comment   spouse smoked in the home for 15 years   Counseling given: Yes Comment: spouse smoked in the home for 15 years  Continue to not smoke  Outpatient Encounter Medications as of 11/03/2018  Medication Sig  . acetaminophen (TYLENOL) 500 MG tablet Take 1,000 mg by mouth every 6 (six) hours as needed for moderate pain.  [name] albuterol (ACCUNEB) 0.63 MG/3ML nebulizer solution Take 3 mLs (0.63 mg total) by nebulization every 6 (six) hours as needed for wheezing.  [name] albuterol (VENTOLIN HFA) 108 (90 Base) MCG/ACT inhaler INHALE 2 PUFFS INTO THE LUNGS EVERY 6 HOURS AS NEEDED FOR WHEEZING OR SHORTNESS OF BREATH.  [name] amLODipine (NORVASC) 5 MG tablet TAKE 1 TABLET EVERY DAY  . budesonide (RHINOCORT ALLERGY) 32 MCG/ACT nasal spray Place 1 spray into both nostrils daily.   . cyclobenzaprine (FLEXERIL) 10 MG tablet Take 1  tablet (10 mg total) by mouth 3 (three) times daily as needed for muscle spasms.  . Digestive Enzymes (PAPAYA AND ENZYMES) CHEW Chew 2 tablets by mouth 3 (three) times daily with meals as needed (digestion of big meals).   . furosemide (LASIX) 20 MG tablet Take 1 tablet (20 mg total) by mouth daily.  [name] loratadine (CLARITIN) 10 MG tablet Take 10 mg by mouth daily as needed for allergies.   [name] losartan (COZAAR) 100 MG tablet TAKE 1 TABLET EVERY DAY  . Multiple Vitamins-Minerals (MACUVITE) TABS Take 1 tablet by mouth daily.    . Probiotic Product (PROBIOTIC DAILY PO) Take 1 tablet by mouth daily.  . promethazine (PHENERGAN) 25 MG tablet Take 1 tablet (25 mg total) by mouth every 6 (six) hours as needed for nausea or vomiting.  . sertraline (ZOLOFT) 50 MG tablet Take 1 tablet (50 mg total) by mouth daily.  [name] warfarin (COUMADIN) 5 MG tablet Take 1/2 tablet daily except 1 tablet on Mon and Fri or AS DIRECTED BY ANTICOAGULATION CLINIC  90 DAY  . HYDROcodone-acetaminophen (NORCO) 7.5-325 MG tablet Take 1 tablet by mouth every  6 (six) hours as needed for moderate pain. (Patient not taking: Reported on 11/03/2018)  . HYDROcodone-acetaminophen (NORCO/VICODIN) 5-325 MG tablet Take 1 tablet by mouth every 6 (six) hours as needed for moderate pain. (Patient not taking: Reported on 11/03/2018)   No facility-administered encounter medications on file as of 11/03/2018.      Review of Systems  Review of Systems  Constitutional: Negative for fatigue and fever.  HENT: Negative for congestion, postnasal drip, sinus pressure and sinus pain.   Respiratory: Negative for cough and shortness of breath.   Cardiovascular: Negative for chest pain and palpitations.  Gastrointestinal: Negative for diarrhea, nausea and vomiting.  Musculoskeletal: Positive for back pain.     Physical Exam  BP (!) 122/52 (BP Location: Left Arm, Cuff Size: Normal)   Pulse 86   Ht _0  (1.575 m)   Wt 136 lb (61.7 kg)   SpO2 97%   BMI  24.87 kg/m   Wt Readings from Last 5 Encounters:  11/03/18 136 lb (61.7 kg)  09/29/18 136 lb (61.7 kg)  09/08/18 134 lb 9.6 oz (61.1 kg)  09/01/18 140 lb (63.5 kg)  08/26/18 147 lb (66.7 kg)    Physical Exam  Constitutional: She is oriented to person, place, and time and well-developed, well-nourished, and in no distress. No distress.  HENT:  Head: Normocephalic and atraumatic.  Right Ear: Hearing, tympanic membrane, external ear and ear canal normal.  Left Ear: Hearing, tympanic membrane, external ear and ear canal normal.  Nose: Nose normal. Right sinus exhibits no maxillary sinus tenderness and no frontal sinus tenderness. Left sinus exhibits no maxillary sinus tenderness and no frontal sinus tenderness.  Mouth/Throat: Uvula is midline and oropharynx is clear and moist. No oropharyngeal exudate.  Eyes: Pupils are equal, round, and reactive to light.  Neck: Normal range of motion. Neck supple.  Cardiovascular: Normal rate, regular rhythm and normal heart sounds.  + Limited cardiovascular exam due to back brace  Pulmonary/Chest: Effort normal and breath sounds normal. No accessory muscle usage. No respiratory distress. She has no decreased breath sounds. She has no wheezes. She has no rhonchi.  + Limited pulmonary assessment due to back brace  Musculoskeletal: Normal range of motion.        General: No edema.  Lymphadenopathy:    She has no cervical adenopathy.  Neurological: She is alert and oriented to person, place, and time.  Walks with Rollator  Skin: Skin is warm and dry. She is not diaphoretic. No erythema.  Psychiatric: Memory and judgment normal. She exhibits a depressed mood. She has a flat affect.  + Patient verbalizes frustration regarding ongoing back pain and feels that this is limiting her quality life  Nursing note and vitals reviewed.     Lab Results:  CBC    Component Value Date/Time   WBC 13.9 (H) 09/01/2018 1407   RBC 4.79 09/01/2018 1407   HGB 15.1  (H) 09/01/2018 1407   HCT 45.2 09/01/2018 1407   PLT 308.0 09/01/2018 1407   MCV 94.5 09/01/2018 1407   MCH 31.6 08/28/2018 0537   MCHC 33.3 09/01/2018 1407   RDW 13.7 09/01/2018 1407   LYMPHSABS 2.0 09/01/2018 1407   MONOABS 1.2 (H) 09/01/2018 1407   EOSABS 0.0 09/01/2018 1407   BASOSABS 0.0 09/01/2018 1407    BMET    Component Value Date/Time   NA 131 (L) 09/01/2018 1407   K 3.8 09/01/2018 1407   CL 91 (L) 09/01/2018 1407   CO2 30 09/01/2018 1407  GLUCOSE 120 (H) 09/01/2018 1407   BUN 19 09/01/2018 1407   CREATININE 0.54 09/01/2018 1407   CALCIUM 9.7 09/01/2018 1407   GFRNONAA >60 08/28/2018 0537   GFRAA >60 08/28/2018 0537    BNP No results found for: BNP  ProBNP No results found for: PROBNP    Assessment & Plan:     Pneumonia of right middle lobe due to infectious organism [facility]) Assessment: Clinical improvement from last antibiotic treatment Recent January/2020 CT stable Limited pulmonary assessment today due to patient's back brace Patient was suspected MAI based off of recent CT imaging Last sputum sample grew out normal flora 2017 sputum grew out Pseudomonas  Plan: Continue to monitor symptoms at this time We will get patient set up for an appointment with Dr. [name] in 4 weeks Could consider bronchoscopy in the future if patient clinically changes Continue flutter valve use as prescribed  Bronchiectasis ([place]) Assessment: Patient with known bronchiectasis that is maintained on a flutter valve Recent sputum culture in December/2019 grew out normal flora 2017 sputum culture grew out Pseudomonas Last CT was stable Clinical improvement today Limited pulmonary exam due to back brace  Plan: Continue flutter valve use as prescribed Could consider bronchoscopy in the future if patient clinically changes Follow-up with Dr. [name] in 4 weeks  Chronic respiratory failure with hypoxia ([place]) Assessment: Arrival to office on room air 85% When sitting  pursed lip breathing patient resume 95% Patient has oxygen already at home with portable tanks Walk in office today on first lap dropped to 85% needing 2 L, on second lab on 2 L patient dropped to 77% needing 3 L  Plan: Wear oxygen with exertion likely needs 2 to 3 L with exertion Patient will need to monitor oxygen saturations with SPO2 monitor for right now be on 3 L Contact our office if saturations are unable to be maintained greater than 88%  I am okay with the patient proceeding forward with a steroid injection in her back managed by neurosurgery except the patient needs to follow-up with primary care to discuss Coumadin dosing and holding.  Lauraine Rinne, NP 11/03/2018   This appointment was 35 minutes long with over 50% of the time in direct face-to-face patient care, assessment, plan of care, and follow-up.

## 2018-11-03 ENCOUNTER — Ambulatory Visit (INDEPENDENT_AMBULATORY_CARE_PROVIDER_SITE_OTHER): Payer: Medicare Other | Admitting: Pulmonary Disease

## 2018-11-03 ENCOUNTER — Encounter: Payer: Self-pay | Admitting: Pulmonary Disease

## 2018-11-03 VITALS — BP 122/52 | HR 86 | Ht 62.0 in | Wt 136.0 lb

## 2018-11-03 DIAGNOSIS — J189 Pneumonia, unspecified organism: Secondary | ICD-10-CM

## 2018-11-03 DIAGNOSIS — J9611 Chronic respiratory failure with hypoxia: Secondary | ICD-10-CM | POA: Diagnosis not present

## 2018-11-03 DIAGNOSIS — J479 Bronchiectasis, uncomplicated: Secondary | ICD-10-CM

## 2018-11-03 DIAGNOSIS — J181 Lobar pneumonia, unspecified organism: Secondary | ICD-10-CM

## 2018-11-03 NOTE — Assessment & Plan Note (Signed)
Assessment: Arrival to office on room air 85% When sitting pursed lip breathing patient resume 95% Patient has oxygen already at home with portable tanks Walk in office today on first lap dropped to 85% needing 2 L, on second lab on 2 L patient dropped to 77% needing 3 L  Plan: Wear oxygen with exertion likely needs 2 to 3 L with exertion Patient will need to monitor oxygen saturations with SPO2 monitor for right now be on 3 L Contact our office if saturations are unable to be maintained greater than 88%

## 2018-11-03 NOTE — Assessment & Plan Note (Signed)
Assessment: Clinical improvement from last antibiotic treatment Recent January/2020 CT stable Limited pulmonary assessment today due to patient's back brace Patient was suspected MAI based off of recent CT imaging Last sputum sample grew out normal flora 2017 sputum grew out Pseudomonas  Plan: Continue to monitor symptoms at this time We will get patient set up for an appointment with Dr. [name] in 4 weeks Could consider bronchoscopy in the future if patient clinically changes Continue flutter valve use as prescribed

## 2018-11-03 NOTE — Patient Instructions (Addendum)
Walk in office today to assess oxygenation  Follow-up with Dr. [name] in 4 weeks, if nothing available in 4 weeks then first available after  Believe it is okay for the patient proceed forward with epidural injections with Dr. Christella Noa >>> Unable to sign patient's form today as it is written for Dr. [name] as well as we are not managing her warfarin >>> Patient to follow-up with PCP to get this signed  Patient can contact neurosurgery and let them know that CT results are in PACS >>> If they are unable to be visualized by neurosurgery then you can present to [place] [facility] to request a formal CD which then you can present to neuro neurosurgery if your CT results   Bronchiectasis: This is the medical term which indicates that you have damage, dilated airways making you more susceptible to respiratory infection. Use a flutter valve 10 breaths twice a day or 4 to 5 breaths 4-5 times a day to help clear mucus out Let us know if you have cough with change in mucus color or fevers or chills.  At that point you would need an antibiotic. Maintain a healthy nutritious diet, eating whole foods Take your medications as prescribed    Continue oxygen therapy as prescribed - 3L with exertion >>>maintain oxygen saturations greater than 88 percent  >>>if unable to maintain oxygen saturations please contact the office  >>>do not smoke with oxygen  >>>can use nasal saline gel or nasal saline rinses to moisturize nose if oxygen causes dryness     It is flu season:   >>>Remember to be washing your hands regularly, using hand sanitizer, be careful to use around herself with has contact with people who are sick will increase her chances of getting sick yourself. >>> Best ways to protect herself from the flu: Receive the yearly flu vaccine, practice good hand hygiene washing with soap and also using hand sanitizer when available, eat a nutritious meals, get adequate rest, hydrate appropriately   Please  contact the office if your symptoms worsen or you have concerns that you are not improving.   Thank you for choosing [facility] for your healthcare, and for allowing [name] to partner with you on your healthcare journey. I am thankful to be able to provide care to you today.   Wyn Quaker FNP-C

## 2018-11-03 NOTE — Assessment & Plan Note (Signed)
Assessment: Patient with known bronchiectasis that is maintained on a flutter valve Recent sputum culture in December/2019 grew out normal flora 2017 sputum culture grew out Pseudomonas Last CT was stable Clinical improvement today Limited pulmonary exam due to back brace  Plan: Continue flutter valve use as prescribed Could consider bronchoscopy in the future if patient clinically changes Follow-up with Dr. [name] in 4 weeks

## 2018-11-03 NOTE — Telephone Encounter (Signed)
Patient is scheduled with Wyn Quaker, NP , 11/03/18, at 1200.

## 2018-11-04 ENCOUNTER — Telehealth: Payer: Self-pay

## 2018-11-04 NOTE — Telephone Encounter (Signed)
Dr. Denice Bors  Have not seen this form in my box yet but I will kep an eye out for it.  Copied from [place] [phone]. Topic: General - Other >> Nov 04, 2018 10:52 AM Windy Kalata wrote: Reason for CRM: Patient called to state that she needs Dr. [name] to sign and send back the release that is being sent today from Dr. Maryjean Ka, she is having a Epidural Injection on 2/3 and needs a release to stop taking her Warfin 5 days in advance.   Best call back is [phone]

## 2018-11-05 NOTE — Telephone Encounter (Signed)
Spoke with pt she will reach out to Dr. Maryjean Ka office and have them fax it again. I provided the fax number for clarity.

## 2018-11-05 NOTE — Telephone Encounter (Signed)
I dont have this form, and dont recall signing, thanks

## 2018-11-06 NOTE — Telephone Encounter (Signed)
Form has been received, filled out and faxed back to [place]. [place] office.

## 2018-11-07 ENCOUNTER — Telehealth: Payer: Self-pay | Admitting: General Practice

## 2018-11-07 NOTE — Telephone Encounter (Signed)
-----   Message from [name], MD sent at 11/07/2018 12:37 PM EST ----- Regarding: RE: coumadin Ok, thanks for trying. ----- Message ----- From: Warden Fillers, RN Sent: 11/07/2018   9:23 AM EST To: [name], MD Subject: RE: coumadin                                   Dr. [name],  Patient is refusing the Lovenox bridge.  I did explain to her the risks of clotting and she and her daughter did verbalize understanding. ----- Message ----- From: [name], MD Sent: 11/05/2018   5:30 PM EST To: Warden Fillers, RN Subject: coumadin                                       [name], this pt needs an ESI with stopping coumadin 5 days prior  Can you help arrange lovenox bridging?  thanks

## 2018-11-10 DIAGNOSIS — M5127 Other intervertebral disc displacement, lumbosacral region: Secondary | ICD-10-CM | POA: Diagnosis not present

## 2018-11-13 LAB — MYCOBACTERIA,CULT W/FLUOROCHROME SMEAR
MICRO NUMBER:: 91533320
SMEAR: NONE SEEN
SPECIMEN QUALITY: ADEQUATE

## 2018-11-14 ENCOUNTER — Ambulatory Visit: Payer: Medicare Other

## 2018-11-17 ENCOUNTER — Ambulatory Visit (INDEPENDENT_AMBULATORY_CARE_PROVIDER_SITE_OTHER): Payer: Medicare Other | Admitting: General Practice

## 2018-11-17 DIAGNOSIS — Z86711 Personal history of pulmonary embolism: Secondary | ICD-10-CM

## 2018-11-17 DIAGNOSIS — Z7901 Long term (current) use of anticoagulants: Secondary | ICD-10-CM

## 2018-11-17 LAB — POCT INR: INR: 2 (ref 2.0–3.0)

## 2018-11-17 NOTE — Patient Instructions (Addendum)
Pre visit review using our clinic review tool, if applicable. No additional management support is needed unless otherwise documented below in the visit note.  Continue to take 1/2 tablet (2.5 mg) daily.  Re-check in 3 to 4 weeks.  See patient instructions.  March 4 - Take last dose of coumadin until after injection.  March 10th and 11th - Take 1 tablet of coumadin  March 12th - continue 1/2 tablet daily.  Re-check INR on 3/16.

## 2018-12-01 ENCOUNTER — Encounter: Payer: Self-pay | Admitting: Emergency Medicine

## 2018-12-01 ENCOUNTER — Ambulatory Visit (INDEPENDENT_AMBULATORY_CARE_PROVIDER_SITE_OTHER): Payer: Medicare Other | Admitting: Emergency Medicine

## 2018-12-01 DIAGNOSIS — J479 Bronchiectasis, uncomplicated: Secondary | ICD-10-CM | POA: Diagnosis not present

## 2018-12-01 DIAGNOSIS — J309 Allergic rhinitis, unspecified: Secondary | ICD-10-CM

## 2018-12-01 MED ORDER — DOXYCYCLINE HYCLATE 100 MG PO TABS: 100.0000 mg | ORAL_TABLET | Freq: Two times a day (BID) | ORAL | 0 refills | Status: AC

## 2018-12-01 NOTE — Assessment & Plan Note (Signed)
Bronchiectasis with adequate control although she has symptoms consistent with the new URI.  No wheeze, no evidence of a bronchitis yet.  We reviewed in detail the signs and symptoms of an exacerbation of her bronchiectasis, I gave her a prescription for doxycycline to start if she shows signs of flaring.  Note that she has a history of colonization with Pseudomonas.  Unfortunately I cannot treat her upfront easily since she does not tolerate quinolones, penicillin.  If she flares and then does not improve on doxycycline then we may have to look for an alternative that she can tolerate.  We will continue her flutter valve, maintenance therapies.  Please keep albuterol available to use 2 puffs if needed for shortness of breath, chest tightness, wheezing, increased mucus production. Continue your flutter valve as you have been using it. Please keep track of your cough, mucus production.  If you notice increased mucus, change in the color of your mucus, fevers, weakness then please start doxycycline 100 mg twice a day. If you are not improving after starting doxycycline then call our office.  We may need to adjust the antibiotic. Follow with Dr [name] in 6 months or sooner if you have any problems

## 2018-12-01 NOTE — Assessment & Plan Note (Signed)
Continue your Rhinocort as you have been taking it. You may want to consider changing your loratadine to an alternative to see if you get more benefit.  You could try either generic Zyrtec or Allegra. We could consider restarting Singulair at some point in the future if you continue to have allergic symptoms

## 2018-12-01 NOTE — Progress Notes (Signed)
  Subjective:    Patient ID: [name], female    DOB: 18-Aug-1930   MRN: [id number]  HPI  ROV 12/01/2018 --pleasant 85 year old never smoker with a history of remote VTE, bronchiectasis and micronodular disease due to suspected MAIC (never proven).  She has associated moderately severe obstructive lung disease.  She deals with chronic cough, has GERD and allergic rhinitis.  She has documented desaturations and chronic hypoxemic respiratory failure, uses oxygen at. She had a PNA following thoracic cement procedure in November.   CT chest 10/27/2018 showed continued chronic right middle lobe airspace disease and chronic micronodular changes with bronchiectasis.  Most recent cultures with normal flora, AFB negative 09/29/2018.    For the last 3 days she has had increased clear mucous and cough. Having URI sx and did have a sick exposure. No purulent mucous yet        Objective:   Physical Exam Vitals:   12/01/18 1206  BP: (!) 150/64  Pulse: 84  SpO2: 92%  Weight: 129 lb 9.6 oz (58.8 kg)  Height: 5\' 2"  (1.575 m)   Gen: Pleasant, well-nourished, in no distress,  normal affect  ENT: No lesions,  mouth clear,  oropharynx clear, no postnasal drip, strong voice  Neck: No JVD, no stridor  Lungs: No use of accessory muscles, no wheezing or crackles.   Cardiovascular: RRR, heart sounds normal, no murmur or gallops, no peripheral edema  Musculoskeletal: she has on a back brace  Neuro: alert, non focal  Skin: Warm, no lesions or rashes     Assessment & Plan:  Bronchiectasis ([place]) Bronchiectasis with adequate control although she has symptoms consistent with the new URI.  No wheeze, no evidence of a bronchitis yet.  We reviewed in detail the signs and symptoms of an exacerbation of her bronchiectasis, I gave her a prescription for doxycycline to start if she shows signs of flaring.  Note that she has a history of colonization with Pseudomonas.  Unfortunately I cannot treat her upfront  easily since she does not tolerate quinolones, penicillin.  If she flares and then does not improve on doxycycline then we may have to look for an alternative that she can tolerate.  We will continue her flutter valve, maintenance therapies.  Please keep albuterol available to use 2 puffs if needed for shortness of breath, chest tightness, wheezing, increased mucus production. Continue your flutter valve as you have been using it. Please keep track of your cough, mucus production.  If you notice increased mucus, change in the color of your mucus, fevers, weakness then please start doxycycline 100 mg twice a day. If you are not improving after starting doxycycline then call our office.  We may need to adjust the antibiotic. Follow with Dr [name] in 6 months or sooner if you have any problems  Allergic rhinitis Continue your Rhinocort as you have been taking it. You may want to consider changing your loratadine to an alternative to see if you get more benefit.  You could try either generic Zyrtec or Allegra. We could consider restarting Singulair at some point in the future if you continue to have allergic symptoms  Baltazar Apo, MD, PhD 12/01/2018, 12:59 PM [facility] [phone] or if no answer [phone]

## 2018-12-01 NOTE — Patient Instructions (Signed)
Please keep albuterol available to use 2 puffs if needed for shortness of breath, chest tightness, wheezing, increased mucus production. Continue your flutter valve as you have been using it. Please keep track of your cough, mucus production.  If you notice increased mucus, change in the color of your mucus, fevers, weakness then please start doxycycline 100 mg twice a day. If you are not improving after starting doxycycline then call our office.  We may need to adjust the antibiotic. Continue your Rhinocort as you have been taking it. You may want to consider changing your loratadine to an alternative to see if you get more benefit.  You could try either generic Zyrtec or Allegra. We could consider restarting Singulair at some point in the future if you continue to have allergic symptoms. Follow with Dr [name] in 6 months or sooner if you have any problems

## 2018-12-04 ENCOUNTER — Other Ambulatory Visit: Payer: Self-pay

## 2018-12-04 ENCOUNTER — Inpatient Hospital Stay
Admission: EM | Admit: 2018-12-04 | Discharge: 2019-01-07 | DRG: 190 | Disposition: E | Payer: Medicare Other | Attending: Internal Medicine | Admitting: Internal Medicine

## 2018-12-04 ENCOUNTER — Encounter: Payer: Self-pay | Admitting: Emergency Medicine

## 2018-12-04 ENCOUNTER — Emergency Department: Payer: Medicare Other

## 2018-12-04 DIAGNOSIS — R0682 Tachypnea, not elsewhere classified: Secondary | ICD-10-CM

## 2018-12-04 DIAGNOSIS — J471 Bronchiectasis with (acute) exacerbation: Secondary | ICD-10-CM | POA: Diagnosis not present

## 2018-12-04 DIAGNOSIS — Z86711 Personal history of pulmonary embolism: Secondary | ICD-10-CM | POA: Diagnosis not present

## 2018-12-04 DIAGNOSIS — Z880 Allergy status to penicillin: Secondary | ICD-10-CM

## 2018-12-04 DIAGNOSIS — Z8673 Personal history of transient ischemic attack (TIA), and cerebral infarction without residual deficits: Secondary | ICD-10-CM | POA: Diagnosis not present

## 2018-12-04 DIAGNOSIS — J90 Pleural effusion, not elsewhere classified: Secondary | ICD-10-CM | POA: Diagnosis present

## 2018-12-04 DIAGNOSIS — Z515 Encounter for palliative care: Secondary | ICD-10-CM | POA: Diagnosis present

## 2018-12-04 DIAGNOSIS — I272 Pulmonary hypertension, unspecified: Secondary | ICD-10-CM | POA: Diagnosis present

## 2018-12-04 DIAGNOSIS — Z66 Do not resuscitate: Secondary | ICD-10-CM | POA: Diagnosis not present

## 2018-12-04 DIAGNOSIS — R0600 Dyspnea, unspecified: Secondary | ICD-10-CM | POA: Diagnosis not present

## 2018-12-04 DIAGNOSIS — F329 Major depressive disorder, single episode, unspecified: Secondary | ICD-10-CM | POA: Diagnosis present

## 2018-12-04 DIAGNOSIS — I160 Hypertensive urgency: Secondary | ICD-10-CM | POA: Diagnosis present

## 2018-12-04 DIAGNOSIS — I10 Essential (primary) hypertension: Secondary | ICD-10-CM | POA: Diagnosis not present

## 2018-12-04 DIAGNOSIS — Z882 Allergy status to sulfonamides status: Secondary | ICD-10-CM

## 2018-12-04 DIAGNOSIS — F411 Generalized anxiety disorder: Secondary | ICD-10-CM

## 2018-12-04 DIAGNOSIS — E876 Hypokalemia: Secondary | ICD-10-CM | POA: Diagnosis present

## 2018-12-04 DIAGNOSIS — R791 Abnormal coagulation profile: Secondary | ICD-10-CM | POA: Diagnosis present

## 2018-12-04 DIAGNOSIS — E861 Hypovolemia: Secondary | ICD-10-CM | POA: Diagnosis present

## 2018-12-04 DIAGNOSIS — R0602 Shortness of breath: Secondary | ICD-10-CM | POA: Diagnosis not present

## 2018-12-04 DIAGNOSIS — R5383 Other fatigue: Secondary | ICD-10-CM | POA: Diagnosis not present

## 2018-12-04 DIAGNOSIS — F419 Anxiety disorder, unspecified: Secondary | ICD-10-CM | POA: Diagnosis present

## 2018-12-04 DIAGNOSIS — R0989 Other specified symptoms and signs involving the circulatory and respiratory systems: Secondary | ICD-10-CM

## 2018-12-04 DIAGNOSIS — J9621 Acute and chronic respiratory failure with hypoxia: Secondary | ICD-10-CM | POA: Diagnosis present

## 2018-12-04 DIAGNOSIS — E8770 Fluid overload, unspecified: Secondary | ICD-10-CM | POA: Diagnosis present

## 2018-12-04 DIAGNOSIS — R000 Tachycardia, unspecified: Secondary | ICD-10-CM | POA: Diagnosis present

## 2018-12-04 DIAGNOSIS — Z8249 Family history of ischemic heart disease and other diseases of the circulatory system: Secondary | ICD-10-CM

## 2018-12-04 DIAGNOSIS — E785 Hyperlipidemia, unspecified: Secondary | ICD-10-CM | POA: Diagnosis present

## 2018-12-04 DIAGNOSIS — J479 Bronchiectasis, uncomplicated: Secondary | ICD-10-CM | POA: Diagnosis not present

## 2018-12-04 DIAGNOSIS — E871 Hypo-osmolality and hyponatremia: Secondary | ICD-10-CM | POA: Diagnosis present

## 2018-12-04 DIAGNOSIS — I2510 Atherosclerotic heart disease of native coronary artery without angina pectoris: Secondary | ICD-10-CM | POA: Diagnosis present

## 2018-12-04 DIAGNOSIS — J9600 Acute respiratory failure, unspecified whether with hypoxia or hypercapnia: Secondary | ICD-10-CM | POA: Diagnosis not present

## 2018-12-04 DIAGNOSIS — Z7189 Other specified counseling: Secondary | ICD-10-CM | POA: Diagnosis not present

## 2018-12-04 DIAGNOSIS — Z823 Family history of stroke: Secondary | ICD-10-CM

## 2018-12-04 DIAGNOSIS — R0603 Acute respiratory distress: Secondary | ICD-10-CM | POA: Diagnosis not present

## 2018-12-04 DIAGNOSIS — Z825 Family history of asthma and other chronic lower respiratory diseases: Secondary | ICD-10-CM

## 2018-12-04 DIAGNOSIS — Z7901 Long term (current) use of anticoagulants: Secondary | ICD-10-CM | POA: Diagnosis not present

## 2018-12-04 LAB — COMPREHENSIVE METABOLIC PANEL
ALBUMIN: 3.7 g/dL (ref 3.5–5.0)
ALT: 25 U/L (ref 0–44)
ANION GAP: 11 (ref 5–15)
AST: 32 U/L (ref 15–41)
Alkaline Phosphatase: 99 U/L (ref 38–126)
BUN: 13 mg/dL (ref 8–23)
CO2: 24 mmol/L (ref 22–32)
Calcium: 8.7 mg/dL — ABNORMAL LOW (ref 8.9–10.3)
Chloride: 77 mmol/L — ABNORMAL LOW (ref 98–111)
Creatinine, Ser: 0.36 mg/dL — ABNORMAL LOW (ref 0.44–1.00)
GFR calc Af Amer: >60 mL/min (ref >60)
GFR calc non Af Amer: >60 mL/min (ref >60)
Glucose, Bld: 145 mg/dL — ABNORMAL HIGH (ref 70–99)
Potassium: 4.1 mmol/L (ref 3.5–5.1)
Sodium: 112 mmol/L — CL (ref 135–145)
Total Bilirubin: 0.4 mg/dL (ref 0.3–1.2)
Total Protein: 7.5 g/dL (ref 6.5–8.1)

## 2018-12-04 LAB — CBC WITH DIFFERENTIAL/PLATELET
Abs Immature Granulocytes: 0.06 10*3/uL (ref 0.00–0.07)
Basophils Absolute: 0 10*3/uL (ref 0.0–0.1)
Basophils Relative: 0 %
EOS PCT: 0 %
Eosinophils Absolute: 0 10*3/uL (ref 0.0–0.5)
HEMATOCRIT: 40.3 % (ref 36.0–46.0)
Hemoglobin: 14.4 g/dL (ref 12.0–15.0)
Immature Granulocytes: 1 %
Lymphocytes Relative: 5 %
Lymphs Abs: 0.5 10*3/uL — ABNORMAL LOW (ref 0.7–4.0)
MCH: 31.2 pg (ref 26.0–34.0)
MCHC: 35.7 g/dL (ref 30.0–36.0)
MCV: 87.2 fL (ref 80.0–100.0)
Monocytes Absolute: 0.9 10*3/uL (ref 0.1–1.0)
Monocytes Relative: 9 %
Neutro Abs: 8.5 10*3/uL — ABNORMAL HIGH (ref 1.7–7.7)
Neutrophils Relative %: 85 %
Platelets: 228 10*3/uL (ref 150–400)
RBC: 4.62 MIL/uL (ref 3.87–5.11)
RDW: 11.9 % (ref 11.5–15.5)
WBC: 10 10*3/uL (ref 4.0–10.5)
nRBC: 0 % (ref 0.0–0.2)

## 2018-12-04 LAB — URINALYSIS, COMPLETE (UACMP) WITH MICROSCOPIC
Bacteria, UA: NONE SEEN
Bilirubin Urine: NEGATIVE
Glucose, UA: >=500 mg/dL — AB
Hgb urine dipstick: NEGATIVE
Ketones, ur: 20 mg/dL — AB
Leukocytes,Ua: NEGATIVE
Nitrite: NEGATIVE
Protein, ur: NEGATIVE mg/dL
Specific Gravity, Urine: 1.013 (ref 1.005–1.030)
pH: 6 (ref 5.0–8.0)

## 2018-12-04 LAB — BASIC METABOLIC PANEL
Anion gap: 10 (ref 5–15)
BUN: 11 mg/dL (ref 8–23)
CO2: 23 mmol/L (ref 22–32)
Calcium: 8.4 mg/dL — ABNORMAL LOW (ref 8.9–10.3)
Chloride: 79 mmol/L — ABNORMAL LOW (ref 98–111)
Creatinine, Ser: 0.31 mg/dL — ABNORMAL LOW (ref 0.44–1.00)
GFR calc Af Amer: >60 mL/min (ref >60)
GFR calc non Af Amer: >60 mL/min (ref >60)
Glucose, Bld: 208 mg/dL — ABNORMAL HIGH (ref 70–99)
Potassium: 3.7 mmol/L (ref 3.5–5.1)
SODIUM: 112 mmol/L — AB (ref 135–145)

## 2018-12-04 LAB — PROTIME-INR
INR: 3.5 — AB (ref 0.8–1.2)
Prothrombin Time: 34.6 seconds — ABNORMAL HIGH (ref 11.4–15.2)

## 2018-12-04 LAB — LACTIC ACID, PLASMA
Lactic Acid, Venous: 2 mmol/L (ref 0.5–1.9)
Lactic Acid, Venous: 2.6 mmol/L (ref 0.5–1.9)

## 2018-12-04 LAB — BLOOD GAS, VENOUS
Acid-Base Excess: 3.9 mmol/L — ABNORMAL HIGH (ref 0.0–2.0)
BICARBONATE: 28.5 mmol/L — AB (ref 20.0–28.0)
O2 Saturation: 73.2 %
PH VEN: 7.44 — AB (ref 7.250–7.430)
Patient temperature: 37
pCO2, Ven: 42 mmHg — ABNORMAL LOW (ref 44.0–60.0)
pO2, Ven: 37 mmHg (ref 32.0–45.0)

## 2018-12-04 LAB — OSMOLALITY: Osmolality: 245 mOsm/kg — CL (ref 275–295)

## 2018-12-04 LAB — INFLUENZA PANEL BY PCR (TYPE A & B)
Influenza A By PCR: NEGATIVE
Influenza B By PCR: NEGATIVE

## 2018-12-04 LAB — OSMOLALITY, URINE: Osmolality, Ur: 533 mOsm/kg (ref 300–900)

## 2018-12-04 LAB — PROCALCITONIN: Procalcitonin: <0.1 ng/mL

## 2018-12-04 MED ORDER — ONDANSETRON HCL 4 MG PO TABS
4.0000 mg | ORAL_TABLET | Freq: Four times a day (QID) | ORAL | Status: DC | PRN
  Filled 2018-12-04: qty 1

## 2018-12-04 MED ORDER — SODIUM CHLORIDE 0.9 % IV SOLN
100.0000 mg | Freq: Two times a day (BID) | INTRAVENOUS | Status: DC
  Administered 2018-12-05 – 2018-12-09 (×10): 100 mg via INTRAVENOUS
  Filled 2018-12-04 (×14): qty 100

## 2018-12-04 MED ORDER — ALBUTEROL SULFATE (2.5 MG/3ML) 0.083% IN NEBU: 5.0000 mg | INHALATION_SOLUTION | Freq: Once | RESPIRATORY_TRACT | Status: DC

## 2018-12-04 MED ORDER — LORATADINE 10 MG PO TABS
10.0000 mg | ORAL_TABLET | Freq: Every day | ORAL | Status: DC | PRN
  Filled 2018-12-04: qty 1

## 2018-12-04 MED ORDER — DILTIAZEM HCL 30 MG PO TABS
60.0000 mg | ORAL_TABLET | Freq: Three times a day (TID) | ORAL | Status: DC
  Administered 2018-12-04 – 2018-12-11 (×20): 60 mg via ORAL
  Filled 2018-12-04 (×5): qty 2
  Filled 2018-12-04: qty 1
  Filled 2018-12-04 (×5): qty 2
  Filled 2018-12-04: qty 1
  Filled 2018-12-04 (×10): qty 2
  Filled 2018-12-04: qty 1
  Filled 2018-12-04: qty 2

## 2018-12-04 MED ORDER — IPRATROPIUM-ALBUTEROL 0.5-2.5 (3) MG/3ML IN SOLN
3.0000 mL | Freq: Once | RESPIRATORY_TRACT | Status: AC
  Administered 2018-12-04: 3 mL via RESPIRATORY_TRACT
  Filled 2018-12-04: qty 3

## 2018-12-04 MED ORDER — ACETAMINOPHEN 650 MG RE SUPP
650.0000 mg | Freq: Four times a day (QID) | RECTAL | Status: DC | PRN
  Filled 2018-12-04: qty 1

## 2018-12-04 MED ORDER — LOSARTAN POTASSIUM 50 MG PO TABS
100.0000 mg | ORAL_TABLET | Freq: Every day | ORAL | Status: DC
  Administered 2018-12-04 – 2018-12-10 (×6): 100 mg via ORAL
  Filled 2018-12-04 (×7): qty 2

## 2018-12-04 MED ORDER — SODIUM CHLORIDE 0.9 % IV SOLN
Freq: Once | INTRAVENOUS | Status: AC
  Administered 2018-12-04: 13:00:00 via INTRAVENOUS

## 2018-12-04 MED ORDER — ONDANSETRON HCL 4 MG/2ML IJ SOLN: 4.0000 mg | Freq: Four times a day (QID) | INTRAMUSCULAR | Status: DC | PRN

## 2018-12-04 MED ORDER — AMLODIPINE BESYLATE 5 MG PO TABS
5.0000 mg | ORAL_TABLET | Freq: Every day | ORAL | Status: DC
  Administered 2018-12-04 – 2018-12-10 (×7): 5 mg via ORAL
  Filled 2018-12-04 (×7): qty 1

## 2018-12-04 MED ORDER — METHYLPREDNISOLONE SODIUM SUCC 125 MG IJ SOLR
125.0000 mg | INTRAMUSCULAR | Status: AC
  Administered 2018-12-04: 125 mg via INTRAVENOUS
  Filled 2018-12-04: qty 2

## 2018-12-04 MED ORDER — FLUTICASONE PROPIONATE 50 MCG/ACT NA SUSP
2.0000 | Freq: Every day | NASAL | Status: DC
  Administered 2018-12-05 – 2018-12-09 (×5): 2 via NASAL
  Filled 2018-12-04 (×2): qty 16

## 2018-12-04 MED ORDER — PROMETHAZINE HCL 25 MG PO TABS
25.0000 mg | ORAL_TABLET | Freq: Four times a day (QID) | ORAL | Status: DC | PRN
  Filled 2018-12-04: qty 1

## 2018-12-04 MED ORDER — BUDESONIDE 0.25 MG/2ML IN SUSP
0.2500 mg | Freq: Two times a day (BID) | RESPIRATORY_TRACT | Status: DC
  Administered 2018-12-04 – 2018-12-11 (×14): 0.25 mg via RESPIRATORY_TRACT
  Filled 2018-12-04 (×15): qty 2

## 2018-12-04 MED ORDER — WARFARIN - PHARMACIST DOSING INPATIENT
Freq: Every day | Status: DC
  Administered 2018-12-08: 19:00:00
  Filled 2018-12-04: qty 1

## 2018-12-04 MED ORDER — ADULT MULTIVITAMIN W/MINERALS CH
1.0000 | ORAL_TABLET | Freq: Every day | ORAL | Status: DC
  Administered 2018-12-05 – 2018-12-10 (×6): 1 via ORAL
  Filled 2018-12-04 (×6): qty 1

## 2018-12-04 MED ORDER — GUAIFENESIN ER 600 MG PO TB12
600.0000 mg | ORAL_TABLET | Freq: Two times a day (BID) | ORAL | Status: DC
  Administered 2018-12-04 – 2018-12-10 (×13): 600 mg via ORAL
  Filled 2018-12-04 (×15): qty 1

## 2018-12-04 MED ORDER — SODIUM CHLORIDE 0.9 % IV SOLN
100.0000 mg | Freq: Once | INTRAVENOUS | Status: AC
  Administered 2018-12-04: 100 mg via INTRAVENOUS
  Filled 2018-12-04: qty 100

## 2018-12-04 MED ORDER — IPRATROPIUM-ALBUTEROL 0.5-2.5 (3) MG/3ML IN SOLN
3.0000 mL | RESPIRATORY_TRACT | Status: DC
  Administered 2018-12-04 (×3): 3 mL via RESPIRATORY_TRACT
  Filled 2018-12-04 (×3): qty 3

## 2018-12-04 MED ORDER — METHYLPREDNISOLONE SODIUM SUCC 125 MG IJ SOLR
60.0000 mg | Freq: Four times a day (QID) | INTRAMUSCULAR | Status: DC
  Administered 2018-12-04 – 2018-12-09 (×18): 60 mg via INTRAVENOUS
  Filled 2018-12-04 (×3): qty 2
  Filled 2018-12-04: qty 0.96
  Filled 2018-12-04 (×6): qty 2
  Filled 2018-12-04: qty 0.96
  Filled 2018-12-04 (×9): qty 2

## 2018-12-04 MED ORDER — HYDRALAZINE HCL 20 MG/ML IJ SOLN
10.0000 mg | Freq: Four times a day (QID) | INTRAMUSCULAR | Status: DC | PRN
  Filled 2018-12-04: qty 0.5

## 2018-12-04 MED ORDER — SODIUM CHLORIDE 0.9 % IV SOLN
INTRAVENOUS | Status: DC
  Administered 2018-12-04 – 2018-12-06 (×3): via INTRAVENOUS

## 2018-12-04 MED ORDER — SODIUM CHLORIDE 0.9 % IV BOLUS
500.0000 mL | Freq: Once | INTRAVENOUS | Status: AC
  Administered 2018-12-04: 500 mL via INTRAVENOUS

## 2018-12-04 MED ORDER — RISAQUAD PO CAPS
ORAL_CAPSULE | Freq: Every day | ORAL | Status: DC
  Administered 2018-12-05 – 2018-12-06 (×2): 1 via ORAL
  Administered 2018-12-07: 11:00:00 via ORAL
  Administered 2018-12-08 – 2018-12-10 (×3): 1 via ORAL
  Filled 2018-12-04 (×6): qty 1

## 2018-12-04 MED ORDER — ACETAMINOPHEN 325 MG PO TABS
650.0000 mg | ORAL_TABLET | Freq: Four times a day (QID) | ORAL | Status: DC | PRN
  Filled 2018-12-04: qty 2

## 2018-12-04 MED ORDER — CYCLOBENZAPRINE HCL 10 MG PO TABS
10.0000 mg | ORAL_TABLET | Freq: Three times a day (TID) | ORAL | Status: DC | PRN
  Administered 2018-12-04 – 2018-12-05 (×2): 10 mg via ORAL
  Filled 2018-12-04 (×2): qty 1

## 2018-12-04 NOTE — ED Notes (Signed)
Admitting MD made aware of critical values, no new orders at this time.

## 2018-12-04 NOTE — ED Notes (Signed)
ED TO INPATIENT HANDOFF REPORT  Name/Age/Gender [name] 85 y.o. female  Code Status    Code Status Orders  (From admission, onward)         Start     Ordered   11/25/2018 1723  Full code  Continuous     11/15/2018 1722        Code Status History    Date Active Date Inactive Code Status Order ID Comments User Context   08/26/2018 2202 08/28/2018 1651 Full Code [id number]  Vianne Bulls, MD ED    Advance Directive Documentation     Most Recent Value  Type of Advance Directive  Healthcare Power of Attorney  (Pended)   Pre-existing out of facility DNR order (yellow form or pink MOST form)  -  "MOST" Form in Place?  -      Home/SNF/Other Home  Chief Complaint sob  Level of Care/Admitting Diagnosis ED Disposition    ED Disposition Condition [place]: [place] [[id number]]  Level of Care: Med-Surg [16]  Diagnosis: SOB (shortness of breath) [951884]  Admitting Physician: Dustin Flock [[id number]]  Attending Physician: Dustin Flock [[id number]]  Estimated length of stay: past midnight tomorrow  Certification:: I certify this patient will need inpatient services for at least 2 midnights  PT Class (Do Not Modify): Inpatient [101]  PT Acc Code (Do Not Modify): Private [1]       Medical History Past Medical History:  Diagnosis Date  . ALLERGIC RHINITIS   . ANXIETY   . Asthma   . Bronchiectasis 12/11/2011   CT chest dx  . CAROTID ARTERY STENOSIS, RIGHT   . Chronic idiopathic constipation   . Chronic rhinitis   . COMMON MIGRAINE   . COPD   . CORONARY ARTERY DISEASE   . CYST/PSEUDOCYST, PANCREAS   . DEPRESSION   . DIVERTICULOSIS, COLON   . DIZZINESS, CHRONIC   . Dyspnea    on exertion  . Esophageal stricture   . GERD   . HYPERLIPIDEMIA   . HYPERTENSION   . Internal hemorrhoids with complication   . OSTEOPOROSIS   . OVERACTIVE BLADDER   . Pulmonary embolism ([place]) 2008  . PULMONARY HYPERTENSION, SECONDARY    . Stroke ([place])   . Tubular adenoma of colon 10/2013  . Unspecified hearing loss     Allergies Allergies  Allergen Reactions  . Raloxifene Other (See Comments)    REACTION: risk of recurrent stroke  . Sulfonamide Derivatives Anaphylaxis    REACTION: tongue swells  . Amoxicillin-Pot Clavulanate Diarrhea    REACTION: diarrhea Has patient had a PCN reaction causing immediate rash, facial/tongue/throat swelling, SOB or lightheadedness with hypotension: unknown Has patient had a PCN reaction causing severe rash involving mucus membranes or skin necrosis: unknown Has patient had a PCN reaction that required hospitalization : unknown Has patient had a PCN reaction occurring within the last 10 years: yes   pt states she should be able to take penicillin, but cant remember actually taking it   . Ciclesonide Other (See Comments)    REACTION: bad smell  . Ciprofloxacin Other (See Comments)    dizziness    IV Location/Drains/Wounds Patient Lines/Drains/Airways Status   Active Line/Drains/Airways    Name:   Placement date:   Placement time:   Site:   Days:   Peripheral IV 11/26/2018 Left Antecubital   11/18/2018    1128    Antecubital   less than 1   Peripheral IV  11/23/2018 Right Arm   12/03/2018    1128    Arm   less than 1          Labs/Imaging Results for orders placed or performed during the hospital encounter of 11/20/2018 (from the past 48 hour(s))  Blood gas, venous     Status: Abnormal   Collection Time: 11/20/2018 11:28 AM  Result Value Ref Range   pH, Ven 7.44 (H) 7.250 - 7.430   pCO2, Ven 42 (L) 44.0 - 60.0 mmHg   pO2, Ven 37.0 32.0 - 45.0 mmHg   Bicarbonate 28.5 (H) 20.0 - 28.0 mmol/L   Acid-Base Excess 3.9 (H) 0.0 - 2.0 mmol/L   O2 Saturation 73.2 %   Patient temperature 37.0    Collection site VEIN    Sample type VEIN     Comment: Performed at [facility], [address] [place], [place] [address]  Comprehensive metabolic panel     Status: Abnormal   Collection  Time: 11/11/2018 11:31 AM  Result Value Ref Range   Sodium 112 (LL) 135 - 145 mmol/L    Comment: CRITICAL RESULT CALLED TO, READ BACK BY AND VERIFIED WITH TOM NAGY @1215  ON 11/27/2018 BY FMW    Potassium 4.1 3.5 - 5.1 mmol/L    Comment: HEMOLYSIS AT THIS LEVEL MAY AFFECT RESULT   Chloride 77 (L) 98 - 111 mmol/L   CO2 24 22 - 32 mmol/L   Glucose, Bld 145 (H) 70 - 99 mg/dL   BUN 13 8 - 23 mg/dL   Creatinine, Ser 0.36 (L) 0.44 - 1.00 mg/dL   Calcium 8.7 (L) 8.9 - 10.3 mg/dL   Total Protein 7.5 6.5 - 8.1 g/dL   Albumin 3.7 3.5 - 5.0 g/dL   AST 32 15 - 41 U/L   ALT 25 0 - 44 U/L   Alkaline Phosphatase 99 38 - 126 U/L   Total Bilirubin 0.4 0.3 - 1.2 mg/dL   GFR calc non Af Amer >60 >60 mL/min   GFR calc Af Amer >60 >60 mL/min   Anion gap 11 5 - 15    Comment: Performed at [facility], [address] [place], [place] [address]  Lactic acid, plasma     Status: Abnormal   Collection Time: 11/23/2018 11:31 AM  Result Value Ref Range   Lactic Acid, Venous 2.0 (HH) 0.5 - 1.9 mmol/L    Comment: CRITICAL RESULT CALLED TO, READ BACK BY AND VERIFIED WITH TOM NAGY @1216  ON 11/11/2018 BY FMW Performed at [facility], [address] [place], [place] [address]   CBC with Differential     Status: Abnormal   Collection Time: 12/05/2018 11:31 AM  Result Value Ref Range   WBC 10.0 4.0 - 10.5 K/uL   RBC 4.62 3.87 - 5.11 MIL/uL   Hemoglobin 14.4 12.0 - 15.0 g/dL   HCT 40.3 36.0 - 46.0 %   MCV 87.2 80.0 - 100.0 fL   MCH 31.2 26.0 - 34.0 pg   MCHC 35.7 30.0 - 36.0 g/dL   RDW 11.9 11.5 - 15.5 %   Platelets 228 150 - 400 K/uL   nRBC 0.0 0.0 - 0.2 %   Neutrophils Relative % 85 %   Neutro Abs 8.5 (H) 1.7 - 7.7 K/uL   Lymphocytes Relative 5 %   Lymphs Abs 0.5 (L) 0.7 - 4.0 K/uL   Monocytes Relative 9 %   Monocytes Absolute 0.9 0.1 - 1.0 K/uL   Eosinophils Relative 0 %  Eosinophils Absolute 0.0 0.0 - 0.5 K/uL   Basophils Relative 0 %   Basophils Absolute 0.0 0.0 - 0.1 K/uL    Immature Granulocytes 1 %   Abs Immature Granulocytes 0.06 0.00 - 0.07 K/uL    Comment: Performed at [facility], [address] [place], [place] [address]  Protime-INR     Status: Abnormal   Collection Time: 11/28/2018 11:31 AM  Result Value Ref Range   Prothrombin Time 34.6 (H) 11.4 - 15.2 seconds   INR 3.5 (H) 0.8 - 1.2    Comment: (NOTE) INR goal varies based on device and disease states. Performed at [facility], [address] [place], [place] [address]   Influenza panel by PCR (type A & B)     Status: None   Collection Time: 11/08/2018  1:14 PM  Result Value Ref Range   Influenza A By PCR NEGATIVE NEGATIVE   Influenza B By PCR NEGATIVE NEGATIVE    Comment: (NOTE) The Xpert Xpress Flu assay is intended as an aid in the diagnosis of  influenza and should not be used as a sole basis for treatment.  This  assay is FDA approved for nasopharyngeal swab specimens only. Nasal  washings and aspirates are unacceptable for Xpert Xpress Flu testing. Performed at [facility], [address] [place], [place] [address]   Urinalysis, Complete w Microscopic     Status: Abnormal   Collection Time: 11/18/2018  3:17 PM  Result Value Ref Range   Color, Urine YELLOW (A) YELLOW   APPearance CLEAR (A) CLEAR   Specific Gravity, Urine 1.013 1.005 - 1.030   pH 6.0 5.0 - 8.0   Glucose, UA >=500 (A) NEGATIVE mg/dL   Hgb urine dipstick NEGATIVE NEGATIVE   Bilirubin Urine NEGATIVE NEGATIVE   Ketones, ur 20 (A) NEGATIVE mg/dL   Protein, ur NEGATIVE NEGATIVE mg/dL   Nitrite NEGATIVE NEGATIVE   Leukocytes,Ua NEGATIVE NEGATIVE   RBC / HPF 11-20 0 - 5 RBC/hpf   WBC, UA 0-5 0 - 5 WBC/hpf   Bacteria, UA NONE SEEN NONE SEEN   Squamous Epithelial / LPF 0-5 0 - 5    Comment: Performed at [facility], [address] [place], [place] [address]  Osmolality, urine     Status: None   Collection Time: 11/24/2018  3:17 PM  Result Value Ref Range   Osmolality, Ur 533 300  - 900 mOsm/kg    Comment: Performed at [facility], [address] [place], [place] [address]  Lactic acid, plasma     Status: Abnormal   Collection Time: 11/16/2018  6:28 PM  Result Value Ref Range   Lactic Acid, Venous 2.6 (HH) 0.5 - 1.9 mmol/L    Comment: CRITICAL RESULT CALLED TO, READ BACK BY AND VERIFIED WITH KAILEY WALKER 11/12/2018 @ 1905  MLK Performed at [facility], [address] [place], [place] [address]   Basic metabolic panel     Status: Abnormal   Collection Time: 11/26/2018  6:28 PM  Result Value Ref Range   Sodium 112 (LL) 135 - 145 mmol/L    Comment: CRITICAL RESULT CALLED TO, READ BACK BY AND VERIFIED WITH KAILEY WALKER 12/03/2018 @ 1905  MLK    Potassium 3.7 3.5 - 5.1 mmol/L    Comment: HEMOLYSIS AT THIS LEVEL MAY AFFECT RESULT   Chloride 79 (L) 98 - 111 mmol/L   CO2 23 22 - 32 mmol/L   Glucose, Bld 208 (H) 70 - 99  mg/dL   BUN 11 8 - 23 mg/dL   Creatinine, Ser 0.31 (L) 0.44 - 1.00 mg/dL   Calcium 8.4 (L) 8.9 - 10.3 mg/dL   GFR calc non Af Amer >60 >60 mL/min   GFR calc Af Amer >60 >60 mL/min   Anion gap 10 5 - 15    Comment: Performed at [facility], [address] [place],  [address]  Osmolality     Status: Abnormal   Collection Time: 11/20/2018  6:28 PM  Result Value Ref Range   Osmolality 245 (LL) 275 - 295 mOsm/kg    Comment: CRITICAL RESULT CALLED TO, READ BACK BY AND VERIFIED WITH: Beadie Matsunaga 2 1934 ON 12/03/2018 BY JUW Performed at [facility], [address] [place],  [address]   Procalcitonin - Baseline     Status: None   Collection Time: 11/30/2018  6:28 PM  Result Value Ref Range   Procalcitonin <0.10 ng/mL    Comment:        Interpretation: PCT (Procalcitonin) <= 0.5 ng/mL: Systemic infection (sepsis) is not likely. Local bacterial infection is possible. (NOTE)       Sepsis PCT Algorithm           Lower Respiratory Tract                                      Infection PCT Algorithm     ----------------------------     ----------------------------         PCT < 0.25 ng/mL                PCT < 0.10 ng/mL         Strongly encourage             Strongly discourage   discontinuation of antibiotics    initiation of antibiotics    ----------------------------     -----------------------------       PCT 0.25 - 0.50 ng/mL            PCT 0.10 - 0.25 ng/mL               OR       >80% decrease in PCT            Discourage initiation of                                            antibiotics      Encourage discontinuation           of antibiotics    ----------------------------     -----------------------------         PCT >= 0.50 ng/mL              PCT 0.26 - 0.50 ng/mL               AND        <80% decrease in PCT             Encourage initiation of                                             antibiotics  Encourage continuation           of antibiotics    ----------------------------     -----------------------------        PCT >= 0.50 ng/mL                  PCT > 0.50 ng/mL               AND         increase in PCT                  Strongly encourage                                      initiation of antibiotics    Strongly encourage escalation           of antibiotics                                     -----------------------------                                           PCT <= 0.25 ng/mL                                                 OR                                        > 80% decrease in PCT                                     Discontinue / Do not initiate                                             antibiotics Performed at [facility], [address], [place], [place] [id number]    Dg Chest 2 View  Result Date: 11/16/2018 CLINICAL DATA:  Shortness of breath. EXAM: CHEST - 2 VIEW COMPARISON:  CT 08/26/2018, 01/16/2017. Chest x-ray 04/25/2018, 05/08/2018, 10/25/2015. FINDINGS: Mediastinum is normal. Heart size stable. Prominent central pulmonary  arteries are again noted consistent with pulmonary hypertension. Diffuse bilateral interstitial prominence noted. Interstitial changes have increased from prior exam. Acute process including pneumonitis and/or interstitial edema can not be excluded. Unchanged right middle lobe atelectasis and or scarring. Underlying chronic interstitial lung disease most likely present. No prominent pleural effusion or pneumothorax. Stable mid lower thoracic vertebral body compression fractures. Prior upper lumbar vertebroplasty. IMPRESSION: Diffuse bilateral interstitial prominence. Interstitial changes have increased from prior exam. Findings suggesting acute interstitial process including pneumonitis and/or interstitial edema. Unchanged right middle lobe atelectasis/scarring. Underlying chronic interstitial lung disease most likely present. Electronically Signed   By: Marcello Moores  Register   On: 11/14/2018 11:48    Pending Labs Unresulted Labs (From admission, onward)  Start     Ordered   12/05/18 0500  CBC  Tomorrow morning,   STAT     11/24/2018 1722   12/05/18 7824  Basic metabolic panel  Tomorrow morning,   STAT     11/24/2018 1722   12/05/18 0500  Protime-INR  Daily,   STAT     11/18/2018 1319   12/05/2018 1723  Culture, sputum-assessment  Once,   STAT     11/11/2018 1722   11/13/2018 1300  Sodium  Every 4 hours,   STAT     11/10/2018 1259   11/15/2018 1100  Culture, blood (Routine x 2)  BLOOD CULTURE X 2,   STAT     11/28/2018 1100          Vitals/Pain Today's Vitals   11/23/2018 1800 11/21/2018 1849 11/20/2018 1900 11/27/2018 2040  BP: (!) 161/64 (!) 163/73 (!) 165/80 (!) 176/71  Pulse: (!) 112 (!) 120  (!) 125  Resp: (!) 26 (!) 22  (!) 31  Temp:      TempSrc:      SpO2: 95% 96%  97%  Weight:      Height:      PainSc:        Isolation Precautions Droplet precaution  Medications Medications  0.9 %  sodium chloride infusion ( Intravenous New Bag/Given 12/03/2018 1859)  acetaminophen (TYLENOL) tablet 650 mg (has no  administration in time range)    Or  acetaminophen (TYLENOL) suppository 650 mg (has no administration in time range)  ondansetron (ZOFRAN) tablet 4 mg (has no administration in time range)    Or  ondansetron (ZOFRAN) injection 4 mg (has no administration in time range)  guaiFENesin (MUCINEX) 12 hr tablet 600 mg (has no administration in time range)  hydrALAZINE (APRESOLINE) injection 10 mg (has no administration in time range)  budesonide (PULMICORT) nebulizer solution 0.25 mg (0.25 mg Nebulization Given 12/01/2018 2025)  ipratropium-albuterol (DUONEB) 0.5-2.5 (3) MG/3ML nebulizer solution 3 mL (3 mLs Nebulization Given 11/13/2018 2025)  methylPREDNISolone sodium succinate (SOLU-MEDROL) 125 mg/2 mL injection 60 mg (has no administration in time range)  doxycycline (VIBRAMYCIN) 100 mg in sodium chloride 0.9 % 250 mL IVPB (has no administration in time range)  diltiazem (CARDIZEM) tablet 60 mg (60 mg Oral Not Given 11/11/2018 1650)  amLODipine (NORVASC) tablet 5 mg (5 mg Oral Given 11/28/2018 2024)  fluticasone (FLONASE) 50 MCG/ACT nasal spray 2 spray (has no administration in time range)  cyclobenzaprine (FLEXERIL) tablet 10 mg (has no administration in time range)  loratadine (CLARITIN) tablet 10 mg (has no administration in time range)  losartan (COZAAR) tablet 100 mg (100 mg Oral Given 11/09/2018 2012)  multivitamin with minerals tablet 1 tablet (has no administration in time range)  acidophilus (RISAQUAD) capsule (has no administration in time range)  promethazine (PHENERGAN) tablet 25 mg (has no administration in time range)  Warfarin - Pharmacist Dosing Inpatient (has no administration in time range)  ipratropium-albuterol (DUONEB) 0.5-2.5 (3) MG/3ML nebulizer solution 3 mL (3 mLs Nebulization Given 11/26/2018 1311)  sodium chloride 0.9 % bolus 500 mL (0 mLs Intravenous Stopped 11/10/2018 1220)  methylPREDNISolone sodium succinate (SOLU-MEDROL) 125 mg/2 mL injection 125 mg (125 mg Intravenous Given 11/25/2018  1321)  doxycycline (VIBRAMYCIN) 100 mg in sodium chloride 0.9 % 250 mL IVPB (0 mg Intravenous Stopped 11/21/2018 1510)  0.9 %  sodium chloride infusion ( Intravenous Stopped 12/03/2018 1720)    Mobility Up w/ walker- decreased mobility at this time d/t compression fractures. Back brace on pt.

## 2018-12-04 NOTE — ED Triage Notes (Addendum)
Patient here with daughter who states she has had worsening shortness of breath for the last 3 days. Reports she was seen at pulmonologist Monday and told her lungs looked good, however she has had increased work or breathing and cough since then. Daughter also reports low grade fever at home and decreased appetite. Also states mother seems to be more slow to respond and sluggish than normal. Patient alert and oriented in triage but slow to respond to questions.

## 2018-12-04 NOTE — ED Notes (Signed)
Dr. Posey Pronto messaged about critical serum osmolality - awaiting response

## 2018-12-04 NOTE — H&P (Signed)
[facility] NAME: [name]    MR#:  [id number]  DATE OF BIRTH:  Feb 21, 1930  DATE OF ADMISSION:  12/03/2018  PRIMARY CARE PHYSICIAN: [name], MD   REQUESTING/REFERRING PHYSICIAN: Delman Kitten, MD  CHIEF COMPLAINT:   Chief Complaint  Patient presents with  . Shortness of Breath  . Cough    HISTORY OF PRESENT ILLNESS: [name]  is a 85 y.o. female with a known history of bronchiectasis, history of pulmonary embolism, pulmonary hypertension, COPD, carotid artery disease, hypertension, hyperlipidemia, depression who uses oxygen only with activity and exertion presenting with shortness of breath.  Patient started not feeling well since Monday was seen by pulmonary who told her to start doxycycline if symptoms got worse.  Patient started not feeling well on Tuesday started the doxycycline.  Earlier this morning patient had a glazed look on her face.  And her breathing got worse therefore daughter brought to the emergency room.  Patient's chest x-ray shows possible worsening interstitial lung disease there is concern for bronchiectasis.  PAST MEDICAL HISTORY:   Past Medical History:  Diagnosis Date  . ALLERGIC RHINITIS   . ANXIETY   . Asthma   . Bronchiectasis 12/11/2011   CT chest dx  . CAROTID ARTERY STENOSIS, RIGHT   . Chronic idiopathic constipation   . Chronic rhinitis   . COMMON MIGRAINE   . COPD   . CORONARY ARTERY DISEASE   . CYST/PSEUDOCYST, PANCREAS   . DEPRESSION   . DIVERTICULOSIS, COLON   . DIZZINESS, CHRONIC   . Dyspnea    on exertion  . Esophageal stricture   . GERD   . HYPERLIPIDEMIA   . HYPERTENSION   . Internal hemorrhoids with complication   . OSTEOPOROSIS   . OVERACTIVE BLADDER   . Pulmonary embolism ([place]) 2008  . PULMONARY HYPERTENSION, SECONDARY   . Stroke ([place])   . Tubular adenoma of colon 10/2013  . Unspecified hearing loss     PAST SURGICAL HISTORY:  Past Surgical History:   Procedure Laterality Date  . ABDOMINAL HYSTERECTOMY  1965  . APPENDECTOMY  1963  . CHOLECYSTECTOMY  1963  . COLONOSCOPY N/A 10/19/2013   Procedure: COLONOSCOPY;  Surgeon: Ladene Artist, MD;  Location: [facility];  Service: Endoscopy;  Laterality: N/A;  . EGD  1999  . KYPHOPLASTY N/A 08/20/2018   Procedure: Lumbar three KYPHOPLASTY;  Surgeon: Ashok Pall, MD;  Location: [facility];  Service: Neurosurgery;  Laterality: N/A;  . PANCREATIC CYST DRAINAGE     x 2  . ROOT CANAL    . s/p sinus surgury    . TONSILLECTOMY      SOCIAL HISTORY:  Social History   Tobacco Use  . Smoking status: Never Smoker  . Smokeless tobacco: Never Used  . Tobacco comment: spouse smoked in the home for 15 years  Substance Use Topics  . Alcohol use: No    FAMILY HISTORY:  Family History  Problem Relation Age of Onset  . Heart disease Mother   . Heart attack Mother        first one age 66  . Emphysema Father        was a smoker  . Stroke Father   . Breast cancer Daughter   . Lymphoma Sister   . Colon cancer Neg Hx   . Esophageal cancer Neg Hx   . Rectal cancer Neg Hx     DRUG ALLERGIES:  Allergies  Allergen  Reactions  . Raloxifene Other (See Comments)    REACTION: risk of recurrent stroke  . Sulfonamide Derivatives Anaphylaxis    REACTION: tongue swells  . Amoxicillin-Pot Clavulanate Diarrhea    REACTION: diarrhea Has patient had a PCN reaction causing immediate rash, facial/tongue/throat swelling, SOB or lightheadedness with hypotension: unknown Has patient had a PCN reaction causing severe rash involving mucus membranes or skin necrosis: unknown Has patient had a PCN reaction that required hospitalization : unknown Has patient had a PCN reaction occurring within the last 10 years: yes   pt states she should be able to take penicillin, but cant remember actually taking it   . Ciclesonide Other (See Comments)    REACTION: bad smell  . Ciprofloxacin Other (See Comments)    dizziness     REVIEW OF SYSTEMS:   CONSTITUTIONAL: No fever, positive fatigue or positive weakness.  EYES: No blurred or double vision.  EARS, NOSE, AND THROAT: No tinnitus or ear pain.  RESPIRATORY: No cough, positive shortness of breath, no wheezing or hemoptysis.  CARDIOVASCULAR: No chest pain, orthopnea, edema.  GASTROINTESTINAL: No nausea, vomiting, diarrhea or abdominal pain.  GENITOURINARY: No dysuria, hematuria.  ENDOCRINE: No polyuria, nocturia,  HEMATOLOGY: No anemia, easy bruising or bleeding SKIN: No rash or lesion. MUSCULOSKELETAL: No joint pain or arthritis.   NEUROLOGIC: No tingling, numbness, weakness.  PSYCHIATRY: No anxiety or depression.   MEDICATIONS AT HOME:  Prior to Admission medications   Medication Sig Start Date End Date Taking? Authorizing Provider  acetaminophen (TYLENOL) 500 MG tablet Take 1,000 mg by mouth every 6 (six) hours as needed for moderate pain.    [provider]  albuterol (ACCUNEB) 0.63 MG/3ML nebulizer solution Take 3 mLs (0.63 mg total) by nebulization every 6 (six) hours as needed for wheezing. 10/06/18   [name], MD  albuterol (VENTOLIN HFA) 108 (90 Base) MCG/ACT inhaler INHALE 2 PUFFS INTO THE LUNGS EVERY 6 HOURS AS NEEDED FOR WHEEZING OR SHORTNESS OF BREATH. 10/06/18   [name], MD  amLODipine (NORVASC) 5 MG tablet TAKE 1 TABLET EVERY DAY 09/29/18   [name], MD  budesonide (RHINOCORT ALLERGY) 32 MCG/ACT nasal spray Place 1 spray into both nostrils daily.     [provider]  cyclobenzaprine (FLEXERIL) 10 MG tablet Take 1 tablet (10 mg total) by mouth 3 (three) times daily as needed for muscle spasms. 09/29/18   [name], MD  Digestive Enzymes (PAPAYA AND ENZYMES) CHEW Chew 2 tablets by mouth 3 (three) times daily with meals as needed (digestion of big meals).     [provider]  doxycycline (VIBRA-TABS) 100 MG tablet Take 1 tablet (100 mg total) by mouth 2 (two) times daily. 12/01/18   Collene Gobble,  MD  furosemide (LASIX) 20 MG tablet Take 1 tablet (20 mg total) by mouth daily. Patient not taking: Reported on 12/01/2018 09/01/18   [name], MD  HYDROcodone-acetaminophen [facility]) 7.5-325 MG tablet Take 1 tablet by mouth every 6 (six) hours as needed for moderate pain. Patient not taking: Reported on 11/03/2018 09/01/18   [name], MD  HYDROcodone-acetaminophen (NORCO/VICODIN) 5-325 MG tablet Take 1 tablet by mouth every 6 (six) hours as needed for moderate pain. Patient not taking: Reported on 11/03/2018 08/21/18   Ashok Pall, MD  loratadine (CLARITIN) 10 MG tablet Take 10 mg by mouth daily as needed for allergies.     [provider]  losartan (COZAAR) 100 MG tablet TAKE 1 TABLET EVERY  DAY 06/10/18   [name], MD  Multiple Vitamins-Minerals [facility]) TABS Take 1 tablet by mouth daily.      [provider]  Probiotic Product (PROBIOTIC DAILY PO) Take 1 tablet by mouth daily.    [provider]  promethazine (PHENERGAN) 25 MG tablet Take 1 tablet (25 mg total) by mouth every 6 (six) hours as needed for nausea or vomiting. 11/07/17   [name], MD  sertraline (ZOLOFT) 50 MG tablet Take 1 tablet (50 mg total) by mouth daily. Patient not taking: Reported on 12/01/2018 05/26/18   [name], MD  warfarin (COUMADIN) 5 MG tablet Take 1/2 tablet daily except 1 tablet on Mon and Fri or [place]  90 DAY 09/29/18   [name], MD      PHYSICAL EXAMINATION:   VITAL SIGNS: Blood pressure (!) 196/93, pulse (!) 104, temperature 98.5 F (36.9 C), temperature source Oral, resp. rate (!) 30, height 5\' 2"  (1.575 m), weight 58.7 kg, SpO2 99 %.  GENERAL:  85 y.o.-year-old patient lying in the bed with no acute distress.  EYES: Pupils equal, round, reactive to light and accommodation. No scleral icterus. Extraocular muscles intact.  HEENT: Head atraumatic, normocephalic. Oropharynx and nasopharynx clear.  NECK:  Supple, no jugular  venous distention. No thyroid enlargement, no tenderness.  LUNGS: Rhonchus breath sounds bilaterally with no accessory muscle usage CARDIOVASCULAR: S1, S2 normal. No murmurs, rubs, or gallops.  ABDOMEN: Soft, nontender, nondistended. Bowel sounds present. No organomegaly or mass.  EXTREMITIES: No pedal edema, cyanosis, or clubbing.  NEUROLOGIC: Cranial nerves II through XII are intact. Muscle strength 5/5 in all extremities. Sensation intact. Gait not checked.  PSYCHIATRIC: The patient is alert and oriented x 3.  SKIN: No obvious rash, lesion, or ulcer.   LABORATORY PANEL:   CBC No results for input(s): WBC, HGB, HCT, PLT, MCV, MCH, MCHC, RDW, LYMPHSABS, MONOABS, EOSABS, BASOSABS, BANDABS in the last 168 hours.  Invalid input(s): NEUTRABS, BANDSABD ------------------------------------------------------------------------------------------------------------------  Chemistries  Recent Labs  Lab 11/26/2018 1131  NA 112*  K 4.1  CL 77*  CO2 24  GLUCOSE 145*  BUN 13  CREATININE 0.36*  CALCIUM 8.7*  AST 32  ALT 25  ALKPHOS 99  BILITOT 0.4   ------------------------------------------------------------------------------------------------------------------ estimated creatinine clearance is 39.9 mL/min (A) (by C-G formula based on SCr of 0.36 mg/dL (L)). ------------------------------------------------------------------------------------------------------------------ No results for input(s): TSH, T4TOTAL, T3FREE, THYROIDAB in the last 72 hours.  Invalid input(s): FREET3   Coagulation profile Recent Labs  Lab 12/03/2018 1131  INR 3.5*   ------------------------------------------------------------------------------------------------------------------- No results for input(s): DDIMER in the last 72 hours. -------------------------------------------------------------------------------------------------------------------  Cardiac Enzymes No results for input(s): CKMB, TROPONINI,  MYOGLOBIN in the last 168 hours.  Invalid input(s): CK ------------------------------------------------------------------------------------------------------------------ Invalid input(s): POCBNP  ---------------------------------------------------------------------------------------------------------------  Urinalysis    Component Value Date/Time   COLORURINE YELLOW 07/30/2018 [place] 07/30/2018 1652   LABSPEC 1.006 07/30/2018 1652   PHURINE 5.0 07/30/2018 1652   GLUCOSEU NEGATIVE 07/30/2018 [place] 11/21/2017 1632   HGBUR LARGE (A) 07/30/2018 1652   BILIRUBINUR NEGATIVE 07/30/2018 1652   BILIRUBINUR [place] 05/08/2018 1439   KETONESUR 5 (A) 07/30/2018 1652   PROTEINUR NEGATIVE 07/30/2018 1652   UROBILINOGEN 0.2 05/08/2018 1439   UROBILINOGEN 0.2 11/21/2017 1632   NITRITE NEGATIVE 07/30/2018 1652   LEUKOCYTESUR NEGATIVE 07/30/2018 1652     RADIOLOGY: Dg Chest 2 View  Result Date: 11/16/2018 CLINICAL DATA:  Shortness of breath. EXAM:  CHEST - 2 VIEW COMPARISON:  CT 08/26/2018, 01/16/2017. Chest x-ray 04/25/2018, 05/08/2018, 10/25/2015. FINDINGS: Mediastinum is normal. Heart size stable. Prominent central pulmonary arteries are again noted consistent with pulmonary hypertension. Diffuse bilateral interstitial prominence noted. Interstitial changes have increased from prior exam. Acute process including pneumonitis and/or interstitial edema can not be excluded. Unchanged right middle lobe atelectasis and or scarring. Underlying chronic interstitial lung disease most likely present. No prominent pleural effusion or pneumothorax. Stable mid lower thoracic vertebral body compression fractures. Prior upper lumbar vertebroplasty. IMPRESSION: Diffuse bilateral interstitial prominence. Interstitial changes have increased from prior exam. Findings suggesting acute interstitial process including pneumonitis and/or interstitial edema. Unchanged right middle lobe  atelectasis/scarring. Underlying chronic interstitial lung disease most likely present. Electronically Signed   By: Marcello Moores  Register   On: 12/03/2018 11:48    EKG: Orders placed or performed during the hospital encounter of 11/27/2018  . ED EKG  . ED EKG    IMPRESSION AND PLAN: Patient is 85 year old white female with chronic lung disease presenting with worsening shortness of breath  1.  Acute respiratory failure suspect this is due to acute bronchiectasis ED physician spoke to Dr. Stoney Bang who is recommending doxycycline We will treat patient with nebulizer steroids and Pulmicort  2.  Severe hyponatremia nephrology has been notified they recommend starting patient on normal saline at 50 cc following sodium every 4 hours Hold diuretics  3.  Accelerated hypertension use IV hydralazine PRN continue home medications with Norvasc Cozaar will adjust as needed  4.  Sinus tachycardia due to underlying lung disease we will monitor on telemetry place her on low-dose Cardizem  5.  Depression continue Zoloft  6.  History of PE continue Coumadin pharmacy consult  All the records are reviewed and case discussed with ED provider. Management plans discussed with the patient, family and they are in agreement.  CODE STATUS: Code Status History    Date Active Date Inactive Code Status Order ID Comments User Context   08/26/2018 2202 08/28/2018 1651 Full Code [id number]  Vianne Bulls, MD ED    Advance Directive Documentation     Most Recent Value  Type of Advance Directive  Healthcare Power of Attorney  Pre-existing out of facility DNR order (yellow form or pink MOST form)  -  "MOST" Form in Place?  -       TOTAL TIME TAKING CARE OF THIS PATIENT:55 minutes.    Dustin Flock M.D on 11/10/2018 at 12:27 PM  Between 7am to 6pm - Pager - [phone]  After 6pm go to [email or web address] - password [email or web address] [facility]  [phone]  CC: Primary care physician; [name],  MD

## 2018-12-04 NOTE — ED Notes (Signed)
500 mL NS bolus discontinued by verbal order from Dr Jacqualine Code. Will change over to NS infusion of 50 mL/h over 4 hours.

## 2018-12-04 NOTE — Consult Note (Signed)
ANTICOAGULATION CONSULT NOTE - Initial Consult  Pharmacy Consult for Warfarin Dosing Indication: pulmonary embolus  Allergies  Allergen Reactions  . Raloxifene Other (See Comments)    REACTION: risk of recurrent stroke  . Sulfonamide Derivatives Anaphylaxis    REACTION: tongue swells  . Amoxicillin-Pot Clavulanate Diarrhea    REACTION: diarrhea Has patient had a PCN reaction causing immediate rash, facial/tongue/throat swelling, SOB or lightheadedness with hypotension: unknown Has patient had a PCN reaction causing severe rash involving mucus membranes or skin necrosis: unknown Has patient had a PCN reaction that required hospitalization : unknown Has patient had a PCN reaction occurring within the last 10 years: yes   pt states she should be able to take penicillin, but cant remember actually taking it   . Ciclesonide Other (See Comments)    REACTION: bad smell  . Ciprofloxacin Other (See Comments)    dizziness   Patient Measurements: Height: 5\' 2"  (157.5 cm) Weight: 129 lb 6.6 oz (58.7 kg) IBW/kg (Calculated) : 50.1  Vital Signs: Temp: 97.5 F (36.4 C) (02/27 1154) Temp Source: Oral (02/27 1037) BP: 179/77 (02/27 1258) Pulse Rate: 91 (02/27 1258)  Labs: Recent Labs    11/14/2018 1131  LABPROT 34.6*  INR 3.5*  CREATININE 0.36*    Estimated Creatinine Clearance: 39.9 mL/min (A) (by C-G formula based on SCr of 0.36 mg/dL (L)).   Medical History: Past Medical History:  Diagnosis Date  . ALLERGIC RHINITIS   . ANXIETY   . Asthma   . Bronchiectasis 12/11/2011   CT chest dx  . CAROTID ARTERY STENOSIS, RIGHT   . Chronic idiopathic constipation   . Chronic rhinitis   . COMMON MIGRAINE   . COPD   . CORONARY ARTERY DISEASE   . CYST/PSEUDOCYST, PANCREAS   . DEPRESSION   . DIVERTICULOSIS, COLON   . DIZZINESS, CHRONIC   . Dyspnea    on exertion  . Esophageal stricture   . GERD   . HYPERLIPIDEMIA   . HYPERTENSION   . Internal hemorrhoids with complication   .  OSTEOPOROSIS   . OVERACTIVE BLADDER   . Pulmonary embolism ([place]) 2008  . PULMONARY HYPERTENSION, SECONDARY   . Stroke ([place])   . Tubular adenoma of colon 10/2013  . Unspecified hearing loss     Assessment: Pharmacy consulted for warfarin dosing and monitor in 85 yo female with PMH of PE. Patient is being admitted with acute respiratory failure secondary to acute bronchiectasis and is receiving Doxycycline BID. INR on admission if supratherapeutic  @ 3.5. Last dose reported to be 2/26 PM.    Home Regimen: Warfarin 2.5mg  Tues, Weds, Thurs, Sat, Sun                             Warfarin 5mg  Mon, Fri   DATE INR DOSE 2/27 3.5  Goal of Therapy:  INR 2-3 Monitor platelets by anticoagulation protocol: Yes   Plan:  2/27 Due to supratherapeutic INR,warfarin will be held tonight.  Will recheck INR daily while on antibiotics per protocol, and CBCs at least every 3 days.   Pharmacy will F/U with AM labs and continue to order warfarin dose based on INRs.   Pernell Dupre, PharmD, BCPS Clinical Pharmacist 11/16/2018 1:16 PM

## 2018-12-04 NOTE — Progress Notes (Signed)
Advanced care plan.  Purpose of the Encounter: CODE STATUS  Parties in Attendance: Patient herself and daughter  Patient's Decision Capacity: Intact  Subjective/Patient's story:  Patient is 85 year old with interstitial lung disease with severe hyponatremia presenting with shortness of breath Objective/Medical story I discussed with the patient regarding her desires for cardiac and pulmonary resuscitation.    Goals of care determination:   Patient states that she would like everything to be done and wants to be a full  CODE STATUS:  Full code  Time spent discussing advanced care planning: 16 minutes

## 2018-12-04 NOTE — ED Notes (Signed)
Report received 

## 2018-12-04 NOTE — ED Provider Notes (Signed)
[facility] Emergency Department Provider Note   ____________________________________________   First MD Initiated Contact with Patient 11/14/2018 1125     (approximate)  I have reviewed the triage vital signs and the nursing notes.   HISTORY  Chief Complaint Shortness of Breath and Cough    HPI [name] is a 85 y.o. female here for evaluation of cough and shortness of breath   Patient reports she has had a little bit of cough and congestion for a couple of days time, saw Dr. Malvin Johns.  Need for doxycycline in the event her symptoms worsen which they have over the last day.  Continue to have cough, fairly nonproductive but hard to get up.  A little bit of wheezing using nebulizers at home.  No chest pain.  No increased leg swelling.  Wears a brace at baseline for her compression fractures  No nausea or vomiting.  Feeling a little bit of fevers at times.  Increased fatigue unable to ambulate today due to the generalized fatigue and weakness.  Started taking doxycycline yesterday has had a total of about 2 doses without relief  Past Medical History:  Diagnosis Date  . ALLERGIC RHINITIS   . ANXIETY   . Asthma   . Bronchiectasis 12/11/2011   CT chest dx  . CAROTID ARTERY STENOSIS, RIGHT   . Chronic idiopathic constipation   . Chronic rhinitis   . COMMON MIGRAINE   . COPD   . CORONARY ARTERY DISEASE   . CYST/PSEUDOCYST, PANCREAS   . DEPRESSION   . DIVERTICULOSIS, COLON   . DIZZINESS, CHRONIC   . Dyspnea    on exertion  . Esophageal stricture   . GERD   . HYPERLIPIDEMIA   . HYPERTENSION   . Internal hemorrhoids with complication   . OSTEOPOROSIS   . OVERACTIVE BLADDER   . Pulmonary embolism ([place]) 2008  . PULMONARY HYPERTENSION, SECONDARY   . Stroke ([place])   . Tubular adenoma of colon 10/2013  . Unspecified hearing loss     Patient Active Problem List   Diagnosis Date Noted  . Pneumonia of right middle lobe due to infectious organism  ([place]) 09/08/2018  . Compression fracture of T12 vertebra with routine healing 09/01/2018  . Chronic obstructive pulmonary disease ([place]) 09/01/2018  . Supratherapeutic INR 09/01/2018  . Gait disorder 09/01/2018  . Peripheral edema 09/01/2018  . CAP (community acquired pneumonia) 08/26/2018  . Hyponatremia 08/26/2018  . History of vertebral compression fracture 08/26/2018  . Compression fracture of L3 vertebra (HCC) 08/20/2018  . Acute bilateral thoracic back pain 05/26/2018  . Cough 11/20/2017  . Abdominal pain 10/15/2017  . Wheezing 08/06/2017  . General weakness 08/02/2016  . Encounter for therapeutic drug monitoring 12/16/2013  . Acute upper respiratory infection 12/16/2013  . Nonspecific abnormal finding in stool contents 10/19/2013  . Benign neoplasm of colon 10/19/2013  . Urinary frequency 08/28/2013  . Hematochezia 05/26/2013  . Chest pain 11/10/2012  . Vertigo 05/28/2012  . Trapezius strain 01/10/2012  . Chronic respiratory failure with hypoxia ([place]) 12/21/2011  . Bronchiectasis ([place]) 12/11/2011  . Fatigue 11/29/2011  . Preventative health care 09/22/2011  . Impaired glucose tolerance 09/21/2011  . Internal hemorrhoids without complication 50/93/2671  . Palpitations 05/21/2011  . Long term (current) use of anticoagulants 01/03/2011  . History of pulmonary embolism 12/08/2010  . FLATULENCE-GAS-BLOATING 07/03/2010  . ABNORMAL FINDINGS GI TRACT 07/03/2010  . WEIGHT LOSS-ABNORMAL 06/06/2010  . NAUSEA 06/06/2010  . ABDOMINAL PAIN-PERIUMBILICAL 24/58/0998  . ABDOMINAL  PAIN-EPIGASTRIC 06/06/2010  . ABNORMAL EXAM-BILIARY TRACT 06/06/2010  . DIZZINESS, CHRONIC 10/14/2009  . OVERACTIVE BLADDER 05/26/2009  . CORONARY ARTERY DISEASE 04/06/2009  . COLONIC POLYPS, HX OF 04/06/2009  . Chronic rhinitis 11/22/2008  . Dysuria 05/27/2008  . HEMATOCHEZIA 05/07/2008  . PULMONARY HYPERTENSION, SECONDARY 01/15/2008  . Dyspnea 12/30/2007  . GAIT IMBALANCE 11/26/2007  . Unspecified  hearing loss 10/17/2007  . Hyperlipidemia 08/05/2007  . ANXIETY 08/05/2007  . Depression 08/05/2007  . COMMON MIGRAINE 08/05/2007  . CAROTID ARTERY STENOSIS, RIGHT 08/05/2007  . Allergic rhinitis 08/05/2007  . DIVERTICULOSIS, COLON 08/05/2007  . CYST/PSEUDOCYST, PANCREAS 08/05/2007  . OSTEOARTHRITIS, KNEE, RIGHT 08/05/2007  . Personal History of Other Diseases of Digestive Disease 08/05/2007  . Essential hypertension 03/11/2007  . GERD 03/11/2007  . OSTEOPOROSIS 03/11/2007  . TRANSIENT ISCHEMIC ATTACK, HX OF 03/11/2007    Past Surgical History:  Procedure Laterality Date  . ABDOMINAL HYSTERECTOMY  1965  . APPENDECTOMY  1963  . CHOLECYSTECTOMY  1963  . COLONOSCOPY N/A 10/19/2013   Procedure: COLONOSCOPY;  Surgeon: Ladene Artist, MD;  Location: [facility];  Service: Endoscopy;  Laterality: N/A;  . EGD  1999  . KYPHOPLASTY N/A 08/20/2018   Procedure: Lumbar three KYPHOPLASTY;  Surgeon: Ashok Pall, MD;  Location: [facility];  Service: Neurosurgery;  Laterality: N/A;  . PANCREATIC CYST DRAINAGE     x 2  . ROOT CANAL    . s/p sinus surgury    . TONSILLECTOMY      Prior to Admission medications   Medication Sig Start Date End Date Taking? Authorizing Provider  acetaminophen (TYLENOL) 500 MG tablet Take 1,000 mg by mouth every 6 (six) hours as needed for moderate pain.    [provider]  albuterol (ACCUNEB) 0.63 MG/3ML nebulizer solution Take 3 mLs (0.63 mg total) by nebulization every 6 (six) hours as needed for wheezing. 10/06/18   [name], MD  albuterol (VENTOLIN HFA) 108 (90 Base) MCG/ACT inhaler INHALE 2 PUFFS INTO THE LUNGS EVERY 6 HOURS AS NEEDED FOR WHEEZING OR SHORTNESS OF BREATH. 10/06/18   [name], MD  amLODipine (NORVASC) 5 MG tablet TAKE 1 TABLET EVERY DAY 09/29/18   [name], MD  budesonide (RHINOCORT ALLERGY) 32 MCG/ACT nasal spray Place 1 spray into both nostrils daily.     [provider]  cyclobenzaprine (FLEXERIL) 10 MG tablet  Take 1 tablet (10 mg total) by mouth 3 (three) times daily as needed for muscle spasms. 09/29/18   [name], MD  Digestive Enzymes (PAPAYA AND ENZYMES) CHEW Chew 2 tablets by mouth 3 (three) times daily with meals as needed (digestion of big meals).     [provider]  doxycycline (VIBRA-TABS) 100 MG tablet Take 1 tablet (100 mg total) by mouth 2 (two) times daily. 12/01/18   Collene Gobble, MD  furosemide (LASIX) 20 MG tablet Take 1 tablet (20 mg total) by mouth daily. Patient not taking: Reported on 12/01/2018 09/01/18   [name], MD  HYDROcodone-acetaminophen [facility]) 7.5-325 MG tablet Take 1 tablet by mouth every 6 (six) hours as needed for moderate pain. Patient not taking: Reported on 11/03/2018 09/01/18   [name], MD  HYDROcodone-acetaminophen (NORCO/VICODIN) 5-325 MG tablet Take 1 tablet by mouth every 6 (six) hours as needed for moderate pain. Patient not taking: Reported on 11/03/2018 08/21/18   Ashok Pall, MD  loratadine (CLARITIN) 10 MG tablet Take 10 mg by mouth daily as needed for allergies.  [provider]  losartan (COZAAR) 100 MG tablet TAKE 1 TABLET EVERY DAY 06/10/18   [name], MD  Multiple Vitamins-Minerals [facility]) TABS Take 1 tablet by mouth daily.      [provider]  Probiotic Product (PROBIOTIC DAILY PO) Take 1 tablet by mouth daily.    [provider]  promethazine (PHENERGAN) 25 MG tablet Take 1 tablet (25 mg total) by mouth every 6 (six) hours as needed for nausea or vomiting. 11/07/17   [name], MD  sertraline (ZOLOFT) 50 MG tablet Take 1 tablet (50 mg total) by mouth daily. Patient not taking: Reported on 12/01/2018 05/26/18   [name], MD  warfarin (COUMADIN) 5 MG tablet Take 1/2 tablet daily except 1 tablet on Mon and Fri or [place]  90 DAY 09/29/18   [name], MD    Allergies Raloxifene; Sulfonamide derivatives; Amoxicillin-pot clavulanate; Ciclesonide; and  Ciprofloxacin  Family History  Problem Relation Age of Onset  . Heart disease Mother   . Heart attack Mother        first one age 71  . Emphysema Father        was a smoker  . Stroke Father   . Breast cancer Daughter   . Lymphoma Sister   . Colon cancer Neg Hx   . Esophageal cancer Neg Hx   . Rectal cancer Neg Hx     Social History Social History   Tobacco Use  . Smoking status: Never Smoker  . Smokeless tobacco: Never Used  . Tobacco comment: spouse smoked in the home for 15 years  Substance Use Topics  . Alcohol use: No  . Drug use: No    Review of Systems Constitutional: Feeling a bit chilled and fatigued Eyes: No visual changes. ENT: No sore throat. Cardiovascular: Denies chest pain. Respiratory: Slightly increased shortness of breath.  Cough.  "Rattling" but relatively nonproductive Gastrointestinal: No abdominal pain.   Genitourinary: Negative for dysuria. Musculoskeletal: Negative for back pain. Skin: Negative for rash. Neurological: Negative for headaches, areas of focal weakness or numbness.    ____________________________________________   PHYSICAL EXAM:  VITAL SIGNS: ED Triage Vitals  Enc Vitals Group     BP 11/20/2018 1037 (!) 182/79     Pulse Rate 11/22/2018 1037 (!) 110     Resp 11/14/2018 1037 (!) 24     Temp 11/27/2018 1037 98.5 F (36.9 C)     Temp Source 11/08/2018 1037 Oral     SpO2 11/14/2018 1037 94 %     Weight 12/03/2018 1038 129 lb 6.6 oz (58.7 kg)     Height 11/23/2018 1038 5\' 2"  (1.575 m)     Head Circumference --      Peak Flow --      Pain Score 11/10/2018 1038 0     Pain Loc --      Pain Edu? --      Excl. in [place]? --     Constitutional: Alert and oriented. Well appearing and in no acute distress.  Daughter reports she is been a little slow in her responses recently. Eyes: Conjunctivae are normal. Head: Atraumatic. Nose: No congestion/rhinnorhea. Mouth/Throat: Mucous membranes are moist. Neck: No stridor.  Cardiovascular: Just  slightly tachycardic rate, regular rhythm. Grossly normal heart sounds.  Good peripheral circulation. Respiratory: Slight tachypnea.  She has diffuse rhonchorous lung sounds.  She does not appear to have any distress other than increased rate.  There are crackles noted in  the lower lungs bilateral.  There is no appreciable wheezing. Gastrointestinal: Soft and nontender. No distention. Musculoskeletal: No lower extremity tenderness nor edema. Neurologic:  Normal speech and language. No gross focal neurologic deficits are appreciated.  Skin:  Skin is warm, dry and intact. No rash noted. Psychiatric: Mood and affect are normal. Speech and behavior are normal.  ____________________________________________   LABS (all labs ordered are listed, but only abnormal results are displayed)  Labs Reviewed  COMPREHENSIVE METABOLIC PANEL - Abnormal; Notable for the following components:      Result Value   Sodium 112 (*)    Chloride 77 (*)    Glucose, Bld 145 (*)    Creatinine, Ser 0.36 (*)    Calcium 8.7 (*)    All other components within normal limits  LACTIC ACID, PLASMA - Abnormal; Notable for the following components:   Lactic Acid, Venous 2.0 (*)    All other components within normal limits  PROTIME-INR - Abnormal; Notable for the following components:   Prothrombin Time 34.6 (*)    INR 3.5 (*)    All other components within normal limits  BLOOD GAS, VENOUS - Abnormal; Notable for the following components:   pH, Ven 7.44 (*)    pCO2, Ven 42 (*)    Bicarbonate 28.5 (*)    Acid-Base Excess 3.9 (*)    All other components within normal limits  CULTURE, BLOOD (ROUTINE X 2)  CULTURE, BLOOD (ROUTINE X 2)  LACTIC ACID, PLASMA  CBC WITH DIFFERENTIAL/PLATELET  URINALYSIS, COMPLETE (UACMP) WITH MICROSCOPIC  INFLUENZA PANEL BY PCR (TYPE A & B)  BASIC METABOLIC PANEL   ____________________________________________  EKG  Reviewed and interpreted by me at 1055 Heart rate 120 QRS 70 QTc  440 Sinus tachycardia, no evidence of acute ischemia ____________________________________________  RADIOLOGY  Dg Chest 2 View  Result Date: 12/05/2018 CLINICAL DATA:  Shortness of breath. EXAM: CHEST - 2 VIEW COMPARISON:  CT 08/26/2018, 01/16/2017. Chest x-ray 04/25/2018, 05/08/2018, 10/25/2015. FINDINGS: Mediastinum is normal. Heart size stable. Prominent central pulmonary arteries are again noted consistent with pulmonary hypertension. Diffuse bilateral interstitial prominence noted. Interstitial changes have increased from prior exam. Acute process including pneumonitis and/or interstitial edema can not be excluded. Unchanged right middle lobe atelectasis and or scarring. Underlying chronic interstitial lung disease most likely present. No prominent pleural effusion or pneumothorax. Stable mid lower thoracic vertebral body compression fractures. Prior upper lumbar vertebroplasty. IMPRESSION: Diffuse bilateral interstitial prominence. Interstitial changes have increased from prior exam. Findings suggesting acute interstitial process including pneumonitis and/or interstitial edema. Unchanged right middle lobe atelectasis/scarring. Underlying chronic interstitial lung disease most likely present. Electronically Signed   By: Marcello Moores  Register   On: 11/28/2018 11:48   Given the patient's symptomatology and past medical history, also examination I would favor this likely to represent infectious ____________________________________________   PROCEDURES  Procedure(s) performed: None  Procedures  Critical Care performed: Yes, see critical care note(s)  CRITICAL CARE Performed by: Delman Kitten   Total critical care time: 35 minutes  Critical care time was exclusive of separately billable procedures and treating other patients.  Critical care was necessary to treat or prevent imminent or life-threatening deterioration.  Critical care was time spent personally by me on the following activities:  development of treatment plan with patient and/or surrogate as well as nursing, discussions with consultants, evaluation of patient's response to treatment, examination of patient, obtaining history from patient or surrogate, ordering and performing treatments and interventions, ordering and review of laboratory studies, ordering  and review of radiographic studies, pulse oximetry and re-evaluation of patient's condition.  ____________________________________________   INITIAL IMPRESSION / ASSESSMENT AND PLAN / ED COURSE  Pertinent labs & imaging results that were available during my care of the patient were reviewed by me and considered in my medical decision making (see chart for details).   Patient presents for increasing fatigue, increasing cough.  [name] is rhonchorous lung sounds by examination and rales.  Clinical history that she provides seems to suggest this may be had developing pneumonia, pulmonary infection, or bronchiectasis exacerbation.  Denies any cardiac symptoms.  No new leg swelling.  No chest pain.  I doubt this would represent pulmonary embolism.  Her symptoms gradually worsening.  No evidence to suggest that this is acute CHF.  Also reports decreased intake last couple of days.  I will start with fluid bolus, steroids, nebulizer therapy.  Have paged pulmonary to discuss antibiotic choice and further therapy recommendations.  Await lab testing.  Clinical Course as of Dec 05 1243  Thu Dec 04, 2018  1152 On O2 today. Uses PRN at home. On now.     [MQ]  1227 Patient did not receive 500 mL Solum chloride bolus yet.  Has not yet been initiated.  Discussed with Dr. Zollie Scale, we will instead change to 50 mL normal saline per hour for 4 hours and then recheck sodium.  Nephrology has been consulted   [MQ]    Clinical Course User Index [MQ] Delman Kitten, MD    Case and consult placed with Dr. Zollie Scale.  Nephrology will consult, advised to start slow infusion of normal saline at 50 mL an hour  for 4 hours and then recheck BMP.  Patient with fatigue, notable hyponatremia.  Patient at risk for acute neurologic and cardiovascular decompensation due to severity of hyponatremia requiring consultation with nephrology.  Suspect likely her confusion reported by daughter and fatigue is contributory or caused by same plus pumonary symptoms.  Dr. Mortimer Fries will consult from pulm. Dr. Posey Pronto admitting.  ____________________________________________   FINAL CLINICAL IMPRESSION(S) / ED DIAGNOSES  Final diagnoses:  Hyponatremia  Fatigue, unspecified type  Bronchiectasis with acute exacerbation [facility])        Note:  This document was prepared using Dragon voice recognition software and may include unintentional dictation errors       Delman Kitten, MD 11/22/2018 1333

## 2018-12-04 NOTE — ED Notes (Signed)
Critical labs called to this RN, admitting MD paged

## 2018-12-05 ENCOUNTER — Telehealth: Payer: Self-pay | Admitting: Emergency Medicine

## 2018-12-05 LAB — CBC
HCT: 34.3 % — ABNORMAL LOW (ref 36.0–46.0)
Hemoglobin: 12.3 g/dL (ref 12.0–15.0)
MCH: 31.6 pg (ref 26.0–34.0)
MCHC: 35.9 g/dL (ref 30.0–36.0)
MCV: 88.2 fL (ref 80.0–100.0)
NRBC: 0 % (ref 0.0–0.2)
PLATELETS: 199 10*3/uL (ref 150–400)
RBC: 3.89 MIL/uL (ref 3.87–5.11)
RDW: 11.8 % (ref 11.5–15.5)
WBC: 5.1 10*3/uL (ref 4.0–10.5)

## 2018-12-05 LAB — OSMOLALITY: OSMOLALITY: 248 mosm/kg — AB (ref 275–295)

## 2018-12-05 LAB — BASIC METABOLIC PANEL
ANION GAP: 12 (ref 5–15)
Anion gap: 11 (ref 5–15)
BUN: 14 mg/dL (ref 8–23)
BUN: 16 mg/dL (ref 8–23)
CO2: 22 mmol/L (ref 22–32)
CO2: 22 mmol/L (ref 22–32)
Calcium: 8.4 mg/dL — ABNORMAL LOW (ref 8.9–10.3)
Calcium: 8.3 mg/dL — ABNORMAL LOW (ref 8.9–10.3)
Chloride: 80 mmol/L — ABNORMAL LOW (ref 98–111)
Chloride: 82 mmol/L — ABNORMAL LOW (ref 98–111)
Creatinine, Ser: 0.3 mg/dL — ABNORMAL LOW (ref 0.44–1.00)
Creatinine, Ser: 0.49 mg/dL (ref 0.44–1.00)
GFR calc Af Amer: >60 mL/min (ref >60)
GFR calc Af Amer: >60 mL/min (ref >60)
GFR calc non Af Amer: >60 mL/min (ref >60)
GLUCOSE: 183 mg/dL — AB (ref 70–99)
Glucose, Bld: 155 mg/dL — ABNORMAL HIGH (ref 70–99)
POTASSIUM: 3.3 mmol/L — AB (ref 3.5–5.1)
Potassium: 3.5 mmol/L (ref 3.5–5.1)
Sodium: 114 mmol/L — CL (ref 135–145)
Sodium: 115 mmol/L — CL (ref 135–145)

## 2018-12-05 LAB — SODIUM
Sodium: 115 mmol/L — CL (ref 135–145)
Sodium: 114 mmol/L — CL (ref 135–145)

## 2018-12-05 LAB — SODIUM, URINE, RANDOM: Sodium, Ur: 12 mmol/L

## 2018-12-05 LAB — PROTIME-INR
INR: 3.7 — ABNORMAL HIGH (ref 0.8–1.2)
Prothrombin Time: 36.3 seconds — ABNORMAL HIGH (ref 11.4–15.2)

## 2018-12-05 LAB — TSH: TSH: 0.387 u[IU]/mL (ref 0.350–4.500)

## 2018-12-05 LAB — OSMOLALITY, URINE: Osmolality, Ur: 764 mOsm/kg (ref 300–900)

## 2018-12-05 MED ORDER — POTASSIUM CHLORIDE CRYS ER 20 MEQ PO TBCR
40.0000 meq | EXTENDED_RELEASE_TABLET | Freq: Once | ORAL | Status: AC
  Administered 2018-12-05: 40 meq via ORAL
  Filled 2018-12-05: qty 2

## 2018-12-05 MED ORDER — IPRATROPIUM-ALBUTEROL 0.5-2.5 (3) MG/3ML IN SOLN
3.0000 mL | Freq: Four times a day (QID) | RESPIRATORY_TRACT | Status: DC
  Administered 2018-12-05 – 2018-12-11 (×19): 3 mL via RESPIRATORY_TRACT
  Filled 2018-12-05 (×22): qty 3

## 2018-12-05 MED ORDER — SODIUM CHLORIDE 3 % IV SOLN
INTRAVENOUS | Status: DC
  Administered 2018-12-05 – 2018-12-06 (×2): 25 mL/h via INTRAVENOUS
  Filled 2018-12-05 (×4): qty 500

## 2018-12-05 NOTE — Telephone Encounter (Signed)
Spoke with pt's daughter, Katharine Look. States that pt had a CXR done at [facility] ER. Pt was admitted from the ER. Katharine Look is wanting RB to review the pt's CXR.  RB - please advise. Thanks.

## 2018-12-05 NOTE — Care Management Note (Signed)
Case Management Note  Patient Details  Name: [name] MRN: [id number] Date of Birth: [birth year]  Subjective/Objective:    Patient is from home alone from [place].  Has been staying with daughter in [place] for the last couple of weeks.  She has been admitted with hyponatremia.  She uses a walker at home.  Denies difficulties obtaining medications.  Current with PCP.  Will need PT evaluation once Na is up.   Daughter [name] [phone] is hoping for SNF placement as patient has been very weak and declining over the last couple of months.  She has used [facility] in the past.  Will continue to follow and assist with discharge planning.                 Action/Plan:   Expected Discharge Date:                  Expected Discharge Plan:  Skilled Nursing Facility  In-House Referral:  Clinical Social Work  Discharge planning Services  CM Consult  Post Acute Care Choice:    Choice offered to:     DME Arranged:    DME Agency:     HH Arranged:    [place] Agency:     Status of Service:  In process, will continue to follow  If discussed at Long Length of Stay Meetings, dates discussed:    Additional Comments:  Elza Rafter, RN 12/05/2018, 5:03 PM

## 2018-12-05 NOTE — Consult Note (Signed)
ANTICOAGULATION CONSULT NOTE - Follow up [place] for Warfarin Dosing Indication: pulmonary embolus  Allergies  Allergen Reactions  . Raloxifene Other (See Comments)    REACTION: risk of recurrent stroke  . Sulfonamide Derivatives Anaphylaxis    REACTION: tongue swells  . Amoxicillin-Pot Clavulanate Diarrhea    REACTION: diarrhea Has patient had a PCN reaction causing immediate rash, facial/tongue/throat swelling, SOB or lightheadedness with hypotension: unknown Has patient had a PCN reaction causing severe rash involving mucus membranes or skin necrosis: unknown Has patient had a PCN reaction that required hospitalization : unknown Has patient had a PCN reaction occurring within the last 10 years: yes   pt states she should be able to take penicillin, but cant remember actually taking it   . Ciclesonide Other (See Comments)    REACTION: bad smell  . Ciprofloxacin Other (See Comments)    dizziness   Patient Measurements: Height: 5\' 4"  (162.6 cm) Weight: 126 lb 6.4 oz (57.3 kg) IBW/kg (Calculated) : 54.7  Vital Signs: Temp: 99.6 F (37.6 C) (02/28 0932) Temp Source: Oral (02/28 0517) BP: 132/50 (02/28 0932) Pulse Rate: 81 (02/28 0932)  Labs: Recent Labs    11/22/2018 1131 11/16/2018 1828 12/05/18 0413  HGB 14.4  --  12.3  HCT 40.3  --  34.3*  PLT 228  --  199  LABPROT 34.6*  --  36.3*  INR 3.5*  --  3.7*  CREATININE 0.36* 0.31* 0.30*    Estimated Creatinine Clearance: 43.6 mL/min (A) (by C-G formula based on SCr of 0.3 mg/dL (L)).   Assessment: Pharmacy consulted for warfarin dosing and monitor in 85 yo female with PMH of PE. Patient is being admitted with acute respiratory failure secondary to acute bronchiectasis and is receiving Doxycycline BID. INR on admission if supratherapeutic  @ 3.5. Last dose reported to be 2/26 PM.    Home Regimen: Warfarin 2.5mg  Tues, Weds, Thurs, Sat, Sun                             Warfarin 5mg  Mon, Fri    DATE INR DOSE 2/27 3.5  2/28 3.7  Goal of Therapy:  INR 2-3 Monitor platelets by anticoagulation protocol: Yes   Plan:  Due to supratherapeutic INR, warfarin will be held tonight. Per RN no s/sx of bleeding noted.  Will recheck INR daily while on antibiotics per protocol, and CBCs at least every 3 days.   Pharmacy will F/U with AM labs and continue to order warfarin dose based on INRs.   Rocky Morel, PharmD, BCPS Clinical Pharmacist 12/05/2018 11:41 AM

## 2018-12-05 NOTE — Progress Notes (Signed)
Patient started on 3% saline, full consult to follow tomorrow.

## 2018-12-05 NOTE — Telephone Encounter (Signed)
Spoke with Katharine Look [facility]) and relayed Dr Agustina Caroli message.  Nothing further is needed.

## 2018-12-05 NOTE — Consult Note (Signed)
PHARMACY CONSULT NOTE - FOLLOW UP  Pharmacy Consult for Electrolyte Monitoring and Replacement   Recent Labs: Potassium (mmol/L)  Date Value  12/05/2018 3.5   Magnesium (mg/dL)  Date Value  08/28/2018 2.1   Calcium (mg/dL)  Date Value  12/05/2018 8.3 (L)   Albumin (g/dL)  Date Value  11/27/2018 3.7   Sodium (mmol/L)  Date Value  12/05/2018 115 (LL)     Assessment: Patient is Hyponatremic, and pharmacy has been consulted to follow-up NA levels prn with provider, as the pt is on Hypertonic saline 3% running at 30ml/hour.  Goal of Therapy:  NA wnl's  Plan:  Serial sodium levels have been ordered - pharmacy will continue to follow and consult as needed for appropriateness of continued hypertonic saline therapy  Lu Duffel, PharmD, BCPS Clinical Pharmacist 12/05/2018 5:04 PM

## 2018-12-05 NOTE — Plan of Care (Signed)
  Problem: Education: Goal: Knowledge of General Education information will improve Description Including pain rating scale, medication(s)/side effects and non-pharmacologic comfort measures Outcome: Not Progressing Note:  Patient's sodium is critically Aguirre at only 114, which most likely is playing a role in her delayed responses and disorientation today. However, this level has actually improved from prior values. Will continue to monitor lab values. [name] [facility]

## 2018-12-05 NOTE — Progress Notes (Signed)
[facility] [place] NAME: Karna Abed    MR#:  [id number]  DATE OF BIRTH:  1930-08-07  SUBJECTIVE:  CHIEF COMPLAINT:   Chief Complaint  Patient presents with  . Shortness of Breath  . Cough   No new complaint this morning.  Confusion improved according to daughter present at bedside.  No seizures.  No bleeding.  No fevers.  REVIEW OF SYSTEMS:  Review of Systems  Constitutional: Negative for chills and fever.  HENT: Negative for hearing loss and tinnitus.   Eyes: Negative for blurred vision and double vision.  Respiratory: Positive for cough and shortness of breath.   Cardiovascular: Negative for chest pain and palpitations.  Gastrointestinal: Negative for heartburn, nausea and vomiting.  Genitourinary: Negative for dysuria and urgency.  Musculoskeletal: Negative for myalgias and neck pain.  Skin: Negative for itching and rash.  Neurological: Negative for dizziness, seizures and headaches.  Psychiatric/Behavioral: Negative for depression and hallucinations.       Confusion significantly improved    DRUG ALLERGIES:   Allergies  Allergen Reactions  . Raloxifene Other (See Comments)    REACTION: risk of recurrent stroke  . Sulfonamide Derivatives Anaphylaxis    REACTION: tongue swells  . Amoxicillin-Pot Clavulanate Diarrhea    REACTION: diarrhea Has patient had a PCN reaction causing immediate rash, facial/tongue/throat swelling, SOB or lightheadedness with hypotension: unknown Has patient had a PCN reaction causing severe rash involving mucus membranes or skin necrosis: unknown Has patient had a PCN reaction that required hospitalization : unknown Has patient had a PCN reaction occurring within the last 10 years: yes   pt states she should be able to take penicillin, but cant remember actually taking it   . Ciclesonide Other (See Comments)    REACTION: bad smell  . Ciprofloxacin Other (See Comments)    dizziness    VITALS:  Blood pressure (!) 132/50, pulse 90, temperature 99.6 F (37.6 C), resp. rate 16, height 5\' 4"  (1.626 m), weight 57.3 kg, SpO2 98 %. PHYSICAL EXAMINATION:   Physical Exam  Constitutional: She is oriented to person, place, and time. She appears well-developed and well-nourished.  HENT:  Head: Normocephalic and atraumatic.  Right Ear: External ear normal.  Mouth/Throat: Oropharynx is clear and moist.  Eyes: Pupils are equal, round, and reactive to light. Conjunctivae and EOM are normal. Right eye exhibits no discharge.  Neck: Normal range of motion. Neck supple. No tracheal deviation present.  Cardiovascular: Normal rate, regular rhythm and normal heart sounds.  Respiratory: Effort normal and breath sounds normal. No respiratory distress.  GI: Soft. Bowel sounds are normal. She exhibits no distension. There is no abdominal tenderness.  Musculoskeletal: Normal range of motion.        General: No edema.  Neurological: She is alert and oriented to person, place, and time. No cranial nerve deficit.  Skin: Skin is warm. She is not diaphoretic. No erythema.  Psychiatric: She has a normal mood and affect.   LABORATORY PANEL:  Female CBC Recent Labs  Lab 12/05/18 0413  WBC 5.1  HGB 12.3  HCT 34.3*  PLT 199   ------------------------------------------------------------------------------------------------------------------ Chemistries  Recent Labs  Lab 11/26/2018 1131  12/05/18 0413  NA 112*   < > 114*  K 4.1   < > 3.3*  CL 77*   < > 80*  CO2 24   < > 22  GLUCOSE 145*   < > 155*  BUN 13   < > 14  CREATININE 0.36*   < > 0.30*  CALCIUM 8.7*   < > 8.4*  AST 32  --   --   ALT 25  --   --   ALKPHOS 99  --   --   BILITOT 0.4  --   --    < > = values in this interval not displayed.   RADIOLOGY:  No results found. ASSESSMENT AND PLAN:   Patient is 85 year old white female with chronic lung disease presenting with worsening shortness of breath  1.  Acute respiratory  failure suspect this is due to acute bronchiectasis ED physician spoke to Dr. Stoney Bang who is recommending doxycycline. Chest x-ray with findings of diffuse bilateral interstitial prominence with findings suggestive of acute interstitial process including pneumonitis or interstitial edema.  Underlying chronic interstitial lung disease most likely present.  Continue nebulizer treatments.  Antibiotics.  Monitor clinically.  2.  Severe hyponatremia with initial sodium of 112 Likely due to underlying pulmonary process No seizures.  Had some mild confusion which is reported to be improved. Repeat sodium this morning was 114.  Serial sodium ordered but appears not to have been done by the lab.  I just requested for another stat BMP to follow-up on sodium level.  Nephrology documented to have been notified and recommendation was to place patient on normal saline at 50 cc an hour while monitoring sodium levels.  Holding off on diuretics.  Serum osmolality of 245.  Urine sodium of 21.  Urine osmolality of 277.  Requested for TSH and cortisol level.  3.    Hypertensive urgency on presentation  Blood pressure appears better controlled this morning on current regimen.   4.  Sinus tachycardia due to underlying lung disease; resolved  5.  Depression continue Zoloft  6.  History of PE continue Coumadin pharmacy consult INR is slightly supratherapeutic at 3.7.  Coumadin to be held tonight.  Pharmacist monitoring  DVT prophylaxis; patient already on Coumadin with INR supratherapeutic  All the records are reviewed and case discussed with Care Management/Social Worker. Management plans discussed with the patient, family and they are in agreement.  CODE STATUS: Full Code  TOTAL TIME TAKING CARE OF THIS PATIENT: 35 minutes.   More than 50% of the time was spent in counseling/coordination of care: YES  POSSIBLE D/C IN 2 DAYS, DEPENDING ON CLINICAL CONDITION.   Lankford Gutzmer M.D on 12/05/2018 at 3:45  PM  Between 7am to 6pm - Pager - [phone]  After 6pm go to [email or web address] - Proofreader  [facility]  Office  [phone]  CC: Primary care physician; [name], MD  Note: This dictation was prepared with Dragon dictation along with smaller phrase technology. Any transcriptional errors that result from this process are unintentional.

## 2018-12-05 NOTE — Telephone Encounter (Signed)
Please let her know that I reviewed. Compared with her priors, it looks like there may be some increased fluid or interstitial inflammation present. Given the fact that she continued to worsen after we treated her as an outpatient, I believe they were right to take her to the hospital. I will follow the notes from [place].

## 2018-12-06 LAB — EXPECTORATED SPUTUM ASSESSMENT W GRAM STAIN, RFLX TO RESP C

## 2018-12-06 LAB — BASIC METABOLIC PANEL
Anion gap: 6 (ref 5–15)
BUN: 18 mg/dL (ref 8–23)
CHLORIDE: 89 mmol/L — AB (ref 98–111)
CO2: 23 mmol/L (ref 22–32)
Calcium: 8.4 mg/dL — ABNORMAL LOW (ref 8.9–10.3)
Creatinine, Ser: 0.49 mg/dL (ref 0.44–1.00)
GFR calc Af Amer: >60 mL/min (ref >60)
GFR calc non Af Amer: >60 mL/min (ref >60)
Glucose, Bld: 162 mg/dL — ABNORMAL HIGH (ref 70–99)
Potassium: 4.3 mmol/L (ref 3.5–5.1)
Sodium: 118 mmol/L — CL (ref 135–145)

## 2018-12-06 LAB — CBC
HCT: 37.8 % (ref 36.0–46.0)
Hemoglobin: 13.1 g/dL (ref 12.0–15.0)
MCH: 31.7 pg (ref 26.0–34.0)
MCHC: 34.7 g/dL (ref 30.0–36.0)
MCV: 91.5 fL (ref 80.0–100.0)
Platelets: 214 10*3/uL (ref 150–400)
RBC: 4.13 MIL/uL (ref 3.87–5.11)
RDW: 12.3 % (ref 11.5–15.5)
WBC: 15.3 10*3/uL — ABNORMAL HIGH (ref 4.0–10.5)
nRBC: 0 % (ref 0.0–0.2)

## 2018-12-06 LAB — PROTIME-INR
INR: 3.4 — AB (ref 0.8–1.2)
Prothrombin Time: 33.5 seconds — ABNORMAL HIGH (ref 11.4–15.2)

## 2018-12-06 LAB — PHOSPHORUS: Phosphorus: 1.9 mg/dL — ABNORMAL LOW (ref 2.5–4.6)

## 2018-12-06 LAB — SODIUM
SODIUM: 124 mmol/L — AB (ref 135–145)
Sodium: 121 mmol/L — ABNORMAL LOW (ref 135–145)
Sodium: 123 mmol/L — ABNORMAL LOW (ref 135–145)

## 2018-12-06 LAB — CORTISOL: Cortisol, Plasma: 12 ug/dL

## 2018-12-06 LAB — EXPECTORATED SPUTUM ASSESSMENT W REFEX TO RESP CULTURE

## 2018-12-06 LAB — MAGNESIUM: Magnesium: 1.7 mg/dL (ref 1.7–2.4)

## 2018-12-06 MED ORDER — DOCUSATE SODIUM 100 MG PO CAPS
100.0000 mg | ORAL_CAPSULE | Freq: Every day | ORAL | Status: DC | PRN
  Administered 2018-12-08: 100 mg via ORAL
  Filled 2018-12-06: qty 1

## 2018-12-06 NOTE — Progress Notes (Signed)
[facility] at [place] NAME: [name]    MR#:  [id number]  DATE OF BIRTH:  18-Apr-1930  SUBJECTIVE:  CHIEF COMPLAINT:   Chief Complaint  Patient presents with  . Shortness of Breath  . Cough   Lying in bed with daughter at bedside. Improvement in confusion, still having some issue with maintaining attention and finding words per patient. No seizures.  + cough, no sputum but feels "wet". Feels shortness of breath has improved. Breathing treatments + PRNs have helped.  Mild pain at left anterior lower extremity + bruising. Daughter thinks it is possible she may have hit it on her walker at home.  Last BM Thursday.   REVIEW OF SYSTEMS:  Review of Systems  Constitutional: Negative for chills and fever. Positive for malaise/fatigue.  HENT: Negative for hearing loss and tinnitus.   Eyes: Negative for blurred vision and double vision.  Respiratory: Positive for cough and shortness of breath.   Cardiovascular: Negative for chest pain and palpitations.  Gastrointestinal: Negative for heartburn, nausea and vomiting.  Genitourinary: Negative for dysuria and urgency.  Musculoskeletal: Positive for myalgia at left anterior lower extremity (may have hit leg with walker at home).  Skin: Negative for itching and rash. Positive for bruising (on warfarin) Neurological: Negative for dizziness, seizures and headaches.  Psychiatric/Behavioral: Negative for depression and hallucinations.       Confusion improved. DRUG ALLERGIES:   Allergies  Allergen Reactions  . Raloxifene Other (See Comments)    REACTION: risk of recurrent stroke  . Sulfonamide Derivatives Anaphylaxis    REACTION: tongue swells  . Amoxicillin-Pot Clavulanate Diarrhea    REACTION: diarrhea Has patient had a PCN reaction causing immediate rash, facial/tongue/throat swelling, SOB or lightheadedness with hypotension: unknown Has patient had a PCN reaction causing severe rash  involving mucus membranes or skin necrosis: unknown Has patient had a PCN reaction that required hospitalization : unknown Has patient had a PCN reaction occurring within the last 10 years: yes   pt states she should be able to take penicillin, but cant remember actually taking it   . Ciclesonide Other (See Comments)    REACTION: bad smell  . Ciprofloxacin Other (See Comments)    dizziness   VITALS:  Blood pressure 138/64, pulse (!) 101, temperature 97.8 F (36.6 C), temperature source Oral, resp. rate 20, height 5\' 4"  (1.626 m), weight 59 kg, SpO2 95 %. PHYSICAL EXAMINATION:  Physical Exam  Constitutional: She is oriented to person, place, and time. She appears well-developed and well-nourished.  HENT:  Head: Normocephalic and atraumatic.  Right Ear: External ear normal.  Mouth/Throat: Oropharynx is clear and moist.  Eyes: Pupils are equal, round, and reactive to light. Conjunctivae and EOM are normal. Right eye exhibits no discharge. Neck: Normal range of motion. Neck supple. No tracheal deviation present.  Cardiovascular: Normal rate, regular rhythm and normal heart sounds. Respiratory: Effort normal and breath sounds normal. No respiratory distress.  GI: Soft. Bowel sounds are normal. She exhibits no distension. There is no abdominal tenderness.  Musculoskeletal: Tender to palpation at left anterior lower extremity. Normal range of motion.        General: No edema.  Neurological: She is alert and oriented to person, place, and time. No cranial nerve deficit.  Skin: Scattered ecchymoses. Skin is warm. She is not diaphoretic. No erythema.  Psychiatric: She has a normal mood and affect.  LABORATORY PANEL:  Female CBC Recent Labs  Lab 12/06/18 [id number]  WBC 15.3*  HGB 13.1  HCT 37.8  PLT 214   ------------------------------------------------------------------------------------------------------------------ Chemistries  Recent Labs  Lab 11/27/2018 1131  12/06/18 0404  12/06/18 1054  NA 112*   < > 118* 121*  K 4.1   < > 4.3  --   CL 77*   < > 89*  --   CO2 24   < > 23  --   GLUCOSE 145*   < > 162*  --   BUN 13   < > 18  --   CREATININE 0.36*   < > 0.49  --   CALCIUM 8.7*   < > 8.4*  --   MG  --   --  1.7  --   AST 32  --   --   --   ALT 25  --   --   --   ALKPHOS 99  --   --   --   BILITOT 0.4  --   --   --    < > = values in this interval not displayed.   RADIOLOGY:  No results found. ASSESSMENT AND PLAN:   [name] is a 85 year old female with history of CVA, PE, hypertension, hyperlipidemia, GERD, coronary artery disease, COPD, depression, who was admitted to [facility]/19/2018 for Bronchiectasis with acute exacerbation, hyponatremia, and generalized fatigue.  Plan:  1.Acute respiratory failure suspect this is due to acute bronchiectasis ED physician spoke to Dr. Stoney Bang who is recommending doxycycline. Chest x-ray with findings of diffuse bilateral interstitial prominence with findings suggestive of acute interstitial process including pneumonitis or interstitial edema. Underlying chronic interstitial lung disease most likely present.  Continue nebulizer treatments.  Antibiotics. Solumedrol.  Monitor clinically.  2.Severe hyponatremia with initial sodium of 112 Likely due to underlying pulmonary process No seizures. Had some mild confusion which is reported to be improved. Repeat sodium this morning was 121. Serial sodium ordered every 6 hours Nephrology/Dr. Juleen China following: continue hypertonic saline, serial sodium and neuro checks. Hold sertraline and diuretics. Serum osmolality of 245.  Urine sodium of 21.  Urine osmolality of 277.   TSH 0.387 and cortisol level 12.0  3.  Hypertensive urgency on presentation  Blood pressure appears better controlled this morning on current regimen.  Continue amlodipine, diltiazem, hydralazine, losartan  4.Sinus tachycardia due to underlying lung disease; resolved  5.Depression  hold sertraline  6.History of PE continue Coumadin, pharmacy monitoring  INR is slightly supratherapeutic at 3.4. Pharmacist monitoring  DVT prophylaxis; patient already on Coumadin with INR supratherapeutic   All the records are reviewed and case is discussed with Care Management/Social Worker. Management plans discussed with the patient and/or family and they are in agreement.  CODE STATUS: Full Code  TOTAL TIME TAKING CARE OF THIS PATIENT: 30 minutes.   More than 50% of the time was spent in counseling/coordination of care: YES  POSSIBLE D/C IN 1-2 DAYS, DEPENDING ON CLINICAL CONDITION.   Grey Schlauch PA-C on 12/06/2018 at 2:57 PM  Between 7am to 6pm - Pager - [phone]  After 6 pm go to [email or web address] - Proofreader  [facility]  Office  [phone]  CC: Primary care physician; [name], MD  Note: This dictation was prepared with Dragon dictation along with smaller phrase technology. Any transcriptional errors that result from this process are unintentional.

## 2018-12-06 NOTE — Consult Note (Signed)
PHARMACY CONSULT NOTE - FOLLOW UP  Pharmacy Consult for Electrolyte Monitoring and Replacement   Recent Labs: Potassium (mmol/L)  Date Value  12/06/2018 4.3   Magnesium (mg/dL)  Date Value  12/06/2018 1.7   Calcium (mg/dL)  Date Value  12/06/2018 8.4 (L)   Albumin (g/dL)  Date Value  11/08/2018 3.7   Phosphorus (mg/dL)  Date Value  12/06/2018 1.9 (L)   Sodium (mmol/L)  Date Value  12/06/2018 121 (L)     Assessment: Patient is Hyponatremic, and pharmacy has been consulted to follow-up NA levels prn with provider, as the pt is on Hypertonic saline 3% running at 39ml/hour.  Goal of Therapy:  Goal sodium: increase by no more than 8 mEq q24hr  Plan:  02/29 @ 1100 Na 121. Still running at 25 ml/hr, Na trending up appropriately, will continue to monitor.  Simpson,Michael L 12/06/2018 3:32 PM

## 2018-12-06 NOTE — Consult Note (Signed)
PHARMACY CONSULT NOTE - FOLLOW UP  Pharmacy Consult for Electrolyte Monitoring and Replacement   Recent Labs: Potassium (mmol/L)  Date Value  12/06/2018 4.3   Magnesium (mg/dL)  Date Value  12/06/2018 1.7   Calcium (mg/dL)  Date Value  12/06/2018 8.4 (L)   Albumin (g/dL)  Date Value  11/15/2018 3.7   Phosphorus (mg/dL)  Date Value  12/06/2018 1.9 (L)   Sodium (mmol/L)  Date Value  12/06/2018 118 (LL)     Assessment: Patient is Hyponatremic, and pharmacy has been consulted to follow-up NA levels prn with provider, as the pt is on Hypertonic saline 3% running at 37ml/hour.  Goal of Therapy:  Goal sodium: increase by no more than 8 mEq q24hr  Plan:  02/29 @ 0400 Na 118. Still running at 25 ml/hr, Na trending up appropriately, will continue to monitor.  Tobie Lords, PharmD, BCPS Clinical Pharmacist 12/06/2018 6:11 AM

## 2018-12-06 NOTE — Consult Note (Signed)
ANTICOAGULATION CONSULT NOTE - Follow up [place] for Warfarin Dosing Indication: pulmonary embolus  Allergies  Allergen Reactions  . Raloxifene Other (See Comments)    REACTION: risk of recurrent stroke  . Sulfonamide Derivatives Anaphylaxis    REACTION: tongue swells  . Amoxicillin-Pot Clavulanate Diarrhea    REACTION: diarrhea Has patient had a PCN reaction causing immediate rash, facial/tongue/throat swelling, SOB or lightheadedness with hypotension: unknown Has patient had a PCN reaction causing severe rash involving mucus membranes or skin necrosis: unknown Has patient had a PCN reaction that required hospitalization : unknown Has patient had a PCN reaction occurring within the last 10 years: yes   pt states she should be able to take penicillin, but cant remember actually taking it   . Ciclesonide Other (See Comments)    REACTION: bad smell  . Ciprofloxacin Other (See Comments)    dizziness   Patient Measurements: Height: 5\' 4"  (162.6 cm) Weight: 130 lb (59 kg) IBW/kg (Calculated) : 54.7  Vital Signs: Temp: 97.8 F (36.6 C) (02/29 0804) Temp Source: Oral (02/29 0804) BP: 138/64 (02/29 0804) Pulse Rate: 101 (02/29 0804)  Labs: Recent Labs    12/02/2018 1131  12/05/18 0413 12/05/18 1604 12/06/18 0404  HGB 14.4  --  12.3  --  13.1  HCT 40.3  --  34.3*  --  37.8  PLT 228  --  199  --  214  LABPROT 34.6*  --  36.3*  --  33.5*  INR 3.5*  --  3.7*  --  3.4*  CREATININE 0.36*   < > 0.30* 0.49 0.49   < > = values in this interval not displayed.    Estimated Creatinine Clearance: 43.6 mL/min (by C-G formula based on SCr of 0.49 mg/dL).   Assessment: Pharmacy consulted for warfarin dosing and monitor in 85 yo female with PMH of PE. Patient is being admitted with acute respiratory failure secondary to acute bronchiectasis and is receiving Doxycycline BID. INR on admission if supratherapeutic  @ 3.5. Last dose reported to be 2/26 PM.    Home Regimen:  Warfarin 2.5mg  Tues, Weds, Thurs, Sat, Sun                             Warfarin 5mg  Mon, Fri   DATE INR DOSE 2/27 3.5  2/28 3.7 2/29  3.4  Goal of Therapy:  INR 2-3 Monitor platelets by anticoagulation protocol: Yes   Plan:  Due to supratherapeutic INR, warfarin will be held tonight.  Will recheck INR daily while on antibiotics per protocol, and CBCs at least every 3 days.   Pharmacy will F/U with AM labs and continue to order warfarin dose based on INRs.   Simpson,Michael L 12/06/2018 3:34 PM

## 2018-12-06 NOTE — Evaluation (Signed)
Physical Therapy Evaluation Patient Details Name: [name] MRN: [id number] DOB: 1929/12/08 Today's Date: 12/06/2018   History of Present Illness  85 yo female with onset of pneumonitis and low Na+ was admitted with recent diagnosis of thoracic compression fractures.  Has spinal brace (TLSO) and has been home with daughter to care for her.  Daughter noted SOB and decreased activity tolerance and brought pt to hospital.  PMHx: asthma, PE, pulm HTN, COPD, carotid artery disease, HTN, depression, home O2, osteoporosis, bronchiectasis, HLD, colon CA, stroke,   Clinical Impression  Pt was seen for mobility and ck of her safety with BSC, short walks and with O2 being used with all mobility.  Her plan is to ask for SNF bed due to her unsafe transfers and balance sitting.  Pt is open to rehab idea, feeling like she wants to manage her fall risk before her transition home with daughter.  Daughter is supportive as she is fearful of pt's decreased control of standing balance and much reduced endurance with SOB upon exertion.    Follow Up Recommendations SNF    Equipment Recommendations  None recommended by PT    Recommendations for Other Services Rehab consult     Precautions / Restrictions Precautions Precautions: Fall(telemetry) Required Braces or Orthoses: Spinal Brace Spinal Brace: Thoracolumbosacral orthotic;Applied in sitting position Restrictions Weight Bearing Restrictions: No  Monitor O2 sats with all effort, on O2.    Mobility  Bed Mobility Overal bed mobility: Needs Assistance                Transfers Overall transfer level: Needs assistance Equipment used: Rolling walker (2 wheeled);1 person hand held assist Transfers: Sit to/from Stand Sit to Stand: Mod assist         General transfer comment: mod assist in TLSO and with cues for hand placement on RW  Ambulation/Gait Ambulation/Gait assistance: Min assist Gait Distance (Feet): 5 Feet Assistive device:  Rolling walker (2 wheeled);1 person hand held assist Gait Pattern/deviations: Step-to pattern;Decreased stride length;Wide base of support;Shuffle Gait velocity: reduced Gait velocity interpretation: <1.31 ft/sec, indicative of household ambulator General Gait Details: pt was able to step on RW with cues for hand placement and monitoring of O2 sats  Stairs            Wheelchair Mobility    Modified Rankin (Stroke Patients Only) Modified Rankin (Stroke Patients Only) Pre-Morbid Rankin Score: No significant disability Modified Rankin: Moderate disability     Balance Overall balance assessment: Needs assistance Sitting-balance support: Feet supported;Bilateral upper extremity supported Sitting balance-Leahy Scale: Fair     Standing balance support: Bilateral upper extremity supported;During functional activity Standing balance-Leahy Scale: Poor                               Pertinent Vitals/Pain Pain Assessment: No/denies pain    Home Living Family/patient expects to be discharged to:: Private residence Living Arrangements: Alone Available Help at Discharge: Family;Available 24 hours/day Type of Home: House Home Access: Level entry     Home Layout: One level Home Equipment: Cane - quad;Toilet riser      Prior Function Level of Independence: Independent with assistive device(s)         Comments: used O2 at night previously, daugther is a caregiver but also helps with shopping and housework     Hand Dominance   Dominant Hand: Right    Extremity/Trunk Assessment   Upper Extremity Assessment Upper Extremity Assessment:  Generalized weakness    Lower Extremity Assessment Lower Extremity Assessment: Generalized weakness    Cervical / Trunk Assessment Cervical / Trunk Assessment: Kyphotic  Communication   Communication: HOH  Cognition Arousal/Alertness: Awake/alert Behavior During Therapy: Impulsive Overall Cognitive Status:  Impaired/Different from baseline Area of Impairment: Orientation;Attention;Memory;Following commands;Safety/judgement;Awareness;Problem solving                 Orientation Level: Situation Current Attention Level: Selective Memory: Decreased recall of precautions Following Commands: Follows one step commands inconsistently;Follows one step commands with increased time Safety/Judgement: Decreased awareness of safety Awareness: Intellectual Problem Solving: Slow processing;Requires verbal cues;Requires tactile cues General Comments: pt is somewhat impulsive about attempting to stand      General Comments General comments (skin integrity, edema, etc.): pt was tested for orthostatics and noted pt sitting was 138/74 BP and pulse 115, sat 92%.  Standing unavailable due to asking to sit suddenly over urine incontinence     Exercises     Assessment/Plan    PT Assessment    PT Problem List         PT Treatment Interventions      PT Goals (Current goals can be found in the Care Plan section)  Acute Rehab PT Goals Patient Stated Goal: to walk and get stronger PT Goal Formulation: With patient/family Time For Goal Achievement: 12/20/18    Frequency Min 2X/week   Barriers to discharge        Co-evaluation               AM-PAC PT "6 Clicks" Mobility  Outcome Measure Help needed turning from your back to your side while in a flat bed without using bedrails?: A Little Help needed moving from lying on your back to sitting on the side of a flat bed without using bedrails?: A Lot Help needed moving to and from a bed to a chair (including a wheelchair)?: A Lot Help needed standing up from a chair using your arms (e.g., wheelchair or bedside chair)?: A Lot Help needed to walk in hospital room?: A Lot Help needed climbing 3-5 steps with a railing? : Total 6 Click Score: 12    End of Session Equipment Utilized During Treatment: Gait belt;Oxygen Activity Tolerance: Patient  tolerated treatment well;Patient limited by fatigue;Treatment limited secondary to medical complications (Comment) Patient left: in bed;with call bell/phone within reach;with bed alarm set;with family/visitor present Nurse Communication: Mobility status PT Visit Diagnosis: Unsteadiness on feet (R26.81);Muscle weakness (generalized) (M62.81);Difficulty in walking, not elsewhere classified (R26.2)    Time: 8101-7510 PT Time Calculation (min) (ACUTE ONLY): 37 min   Charges:   PT Evaluation $PT Eval Moderate Complexity: 1 Mod PT Treatments $Therapeutic Activity: 8-22 mins       Ramond Dial 12/06/2018, 4:58 PM  Mee Hives, PT MS Acute Rehab Dept. Number: [place] [facility] [place]

## 2018-12-06 NOTE — Progress Notes (Signed)
Lab was called to obtain the result of sodium level around 3;35 am.  Lab inform me that this level was not obtain at 0230 as it was due.  I even call the LAB department around 02;10 AM TO REMIND them THAT THE SODIUM LEVEL WAS DUE AT 02;30..  Order is in for  sodium level every 4 hours.  They finally came up to draw lab around 04;00 AM after I put order in for stat sodium level.  CHARGE  Nurse Wendelyn Breslow was notified about this.

## 2018-12-06 NOTE — Progress Notes (Signed)
Sodium has been trending up, so Dr. Juleen China said to DC the 3% sodium and increase her normal saline to 100 mL per hour.  Patient reports feeling more alter and less confused.  Family reports she is speaking better.  She still forgets some things but communicates well verbally.  Phillis Knack, RN

## 2018-12-06 NOTE — Consult Note (Signed)
PHARMACY CONSULT NOTE - FOLLOW UP  Pharmacy Consult for Electrolyte Monitoring and Replacement   Recent Labs: Potassium (mmol/L)  Date Value  12/05/2018 3.5   Magnesium (mg/dL)  Date Value  08/28/2018 2.1   Calcium (mg/dL)  Date Value  12/05/2018 8.3 (L)   Albumin (g/dL)  Date Value  12/05/2018 3.7   Sodium (mmol/L)  Date Value  12/05/2018 114 (LL)     Assessment: Patient is Hyponatremic, and pharmacy has been consulted to follow-up NA levels prn with provider, as the pt is on Hypertonic saline 3% running at 76ml/hour.  Goal of Therapy:  Goal sodium: increase by no more than 8 mEq q24hr  Plan:  02/28 @ 2230 Na 114. Goal 122. Still running at 25 ml/hr will continue to monitor.  Tobie Lords, PharmD, BCPS Clinical Pharmacist 12/06/2018 2:17 AM

## 2018-12-06 NOTE — Consult Note (Signed)
[facility]  CONSULT NOTE    Date: 12/06/2018                  Patient Name:  [name]  MRN: [id number]  DOB: 1930-06-25  Age / Sex: 85 y.o., female         PCP: [name], MD                 Service Requesting Consult: Dr. Manuella Ghazi                 Reason for Consult: Hyponatremia            History of Present Illness: Ms. [name]  Presents with hyponatremia. Started on hypertonic saline. Sodium has improved. Daughter at bedside.   Patient was just seen by pulmonology where doxycycline was ordered.   Patient has not been taking sertraline.   Patient states she has been having one week of nausea and poor PO intake.    Medications: Outpatient medications: Medications Prior to Admission  Medication Sig Dispense Refill Last Dose  . acetaminophen (TYLENOL) 500 MG tablet Take 1,000 mg by mouth every 6 (six) hours as needed for moderate pain.   12/03/2018 at prn  . albuterol (ACCUNEB) 0.63 MG/3ML nebulizer solution Take 3 mLs (0.63 mg total) by nebulization every 6 (six) hours as needed for wheezing. 75 mL 12 12/03/2018 at 0700  . amLODipine (NORVASC) 5 MG tablet TAKE 1 TABLET EVERY DAY 90 tablet 1 12/03/2018 at 0800  . budesonide (RHINOCORT ALLERGY) 32 MCG/ACT nasal spray Place 1 spray into both nostrils daily.    12/03/2018 at 1800  . doxycycline (VIBRA-TABS) 100 MG tablet Take 1 tablet (100 mg total) by mouth 2 (two) times daily. 14 tablet 0 12/03/2018 at 0800  . loratadine (CLARITIN) 10 MG tablet Take 10 mg by mouth daily as needed for allergies.    12/03/2018 at prn  . losartan (COZAAR) 100 MG tablet TAKE 1 TABLET EVERY DAY 90 tablet 1 12/03/2018 at 0800  . Multiple Vitamins-Minerals (MACUVITE) TABS Take 1 tablet by mouth daily.     12/03/2018 at 0700  . Probiotic Product (PROBIOTIC DAILY PO) Take 1 tablet by mouth daily.   Past Week at prn  . promethazine (PHENERGAN) 25 MG tablet Take 1 tablet (25 mg total) by mouth every 6 (six) hours as  needed for nausea or vomiting. 30 tablet 1 Past Week at prn  . warfarin (COUMADIN) 5 MG tablet Take 1/2 tablet daily except 1 tablet on Mon and Fri or [place]  90 DAY (Patient taking differently: Take 2.5 mg by mouth one time only at 6 PM. Take 1/2 tablet daily AS DIRECTED BY ANTICOAGULATION CLINIC  90 DAY) 60 tablet 0 12/03/2018 at 1800  . albuterol (VENTOLIN HFA) 108 (90 Base) MCG/ACT inhaler INHALE 2 PUFFS INTO THE LUNGS EVERY 6 HOURS AS NEEDED FOR WHEEZING OR SHORTNESS OF BREATH. 18 Inhaler 0 prn at prn  . cyclobenzaprine (FLEXERIL) 10 MG tablet Take 1 tablet (10 mg total) by mouth 3 (three) times daily as needed for muscle spasms. 60 tablet 5 prn at prn  . Digestive Enzymes (PAPAYA AND ENZYMES) CHEW Chew 2 tablets by mouth 3 (three) times daily with meals as needed (digestion of big meals).    prn at prn  . furosemide (LASIX) 20 MG tablet Take 1 tablet (20 mg total) by mouth daily. (Patient not taking: Reported on 12/01/2018) 5 tablet 0 Not  Taking  . HYDROcodone-acetaminophen (NORCO) 7.5-325 MG tablet Take 1 tablet by mouth every 6 (six) hours as needed for moderate pain. (Patient not taking: Reported on 11/03/2018) 120 tablet 0 Not Taking  . HYDROcodone-acetaminophen (NORCO/VICODIN) 5-325 MG tablet Take 1 tablet by mouth every 6 (six) hours as needed for moderate pain. (Patient not taking: Reported on 11/03/2018) 30 tablet 0 Not Taking  . sertraline (ZOLOFT) 50 MG tablet Take 1 tablet (50 mg total) by mouth daily. (Patient not taking: Reported on 12/01/2018) 90 tablet 3 Not Taking    Current medications: Current Facility-Administered Medications  Medication Dose Route Frequency Provider Last Rate Last Dose  . 0.9 %  sodium chloride infusion   Intravenous Continuous Dustin Flock, MD   Stopped at 12/05/18 1732  . acetaminophen (TYLENOL) tablet 650 mg  650 mg Oral Q6H PRN Dustin Flock, MD       Or  . acetaminophen (TYLENOL) suppository 650 mg  650 mg Rectal Q6H PRN  Dustin Flock, MD      . acidophilus (RISAQUAD) capsule   Oral Daily Dustin Flock, MD   1 capsule at 12/06/18 1112  . amLODipine (NORVASC) tablet 5 mg  5 mg Oral Daily Dustin Flock, MD   5 mg at 12/06/18 1116  . budesonide (PULMICORT) nebulizer solution 0.25 mg  0.25 mg Nebulization BID Dustin Flock, MD   0.25 mg at 12/06/18 0726  . cyclobenzaprine (FLEXERIL) tablet 10 mg  10 mg Oral TID PRN Dustin Flock, MD   10 mg at 12/05/18 2120  . diltiazem (CARDIZEM) tablet 60 mg  60 mg Oral Q8H Dustin Flock, MD   60 mg at 12/06/18 0544  . doxycycline (VIBRAMYCIN) 100 mg in sodium chloride 0.9 % 250 mL IVPB  100 mg Intravenous Q12H Dustin Flock, MD 125 mL/hr at 12/05/18 2321 100 mg at 12/05/18 2321  . fluticasone (FLONASE) 50 MCG/ACT nasal spray 2 spray  2 spray Each Nare Daily Dustin Flock, MD   2 spray at 12/05/18 1139  . guaiFENesin (MUCINEX) 12 hr tablet 600 mg  600 mg Oral BID Dustin Flock, MD   600 mg at 12/06/18 1113  . hydrALAZINE (APRESOLINE) injection 10 mg  10 mg Intravenous Q6H PRN Dustin Flock, MD      . ipratropium-albuterol (DUONEB) 0.5-2.5 (3) MG/3ML nebulizer solution 3 mL  3 mL Nebulization Q6H Dustin Flock, MD   3 mL at 12/06/18 0726  . loratadine (CLARITIN) tablet 10 mg  10 mg Oral Daily PRN Dustin Flock, MD      . losartan (COZAAR) tablet 100 mg  100 mg Oral Daily Dustin Flock, MD   100 mg at 12/06/18 1114  . methylPREDNISolone sodium succinate (SOLU-MEDROL) 125 mg/2 mL injection 60 mg  60 mg Intravenous Q6H Dustin Flock, MD   60 mg at 12/06/18 1123  . multivitamin with minerals tablet 1 tablet  1 tablet Oral Daily Dustin Flock, MD   1 tablet at 12/06/18 1117  . ondansetron (ZOFRAN) tablet 4 mg  4 mg Oral Q6H PRN Dustin Flock, MD       Or  . ondansetron (ZOFRAN) injection 4 mg  4 mg Intravenous Q6H PRN Dustin Flock, MD      . promethazine (PHENERGAN) tablet 25 mg  25 mg Oral Q6H PRN Dustin Flock, MD      . sodium chloride (hypertonic)  3 % solution   Intravenous Continuous Lateef, Munsoor, MD 25 mL/hr at 12/05/18 1748 25 mL/hr at 12/05/18 1748  . Warfarin - Pharmacist Dosing Inpatient  Does not apply q1800 Pernell Dupre, RPH          Allergies: Allergies  Allergen Reactions  . Raloxifene Other (See Comments)    REACTION: risk of recurrent stroke  . Sulfonamide Derivatives Anaphylaxis    REACTION: tongue swells  . Amoxicillin-Pot Clavulanate Diarrhea    REACTION: diarrhea Has patient had a PCN reaction causing immediate rash, facial/tongue/throat swelling, SOB or lightheadedness with hypotension: unknown Has patient had a PCN reaction causing severe rash involving mucus membranes or skin necrosis: unknown Has patient had a PCN reaction that required hospitalization : unknown Has patient had a PCN reaction occurring within the last 10 years: yes   pt states she should be able to take penicillin, but cant remember actually taking it   . Ciclesonide Other (See Comments)    REACTION: bad smell  . Ciprofloxacin Other (See Comments)    dizziness      Past Medical History: Past Medical History:  Diagnosis Date  . ALLERGIC RHINITIS   . ANXIETY   . Asthma   . Bronchiectasis 12/11/2011   CT chest dx  . CAROTID ARTERY STENOSIS, RIGHT   . Chronic idiopathic constipation   . Chronic rhinitis   . COMMON MIGRAINE   . COPD   . CORONARY ARTERY DISEASE   . CYST/PSEUDOCYST, PANCREAS   . DEPRESSION   . DIVERTICULOSIS, COLON   . DIZZINESS, CHRONIC   . Dyspnea    on exertion  . Esophageal stricture   . GERD   . HYPERLIPIDEMIA   . HYPERTENSION   . Internal hemorrhoids with complication   . OSTEOPOROSIS   . OVERACTIVE BLADDER   . Pulmonary embolism ([place]) 2008  . PULMONARY HYPERTENSION, SECONDARY   . Stroke ([place])   . Tubular adenoma of colon 10/2013  . Unspecified hearing loss      Past Surgical History: Past Surgical History:  Procedure Laterality Date  . ABDOMINAL HYSTERECTOMY  1965  . APPENDECTOMY  1963   . CHOLECYSTECTOMY  1963  . COLONOSCOPY N/A 10/19/2013   Procedure: COLONOSCOPY;  Surgeon: Ladene Artist, MD;  Location: [facility];  Service: Endoscopy;  Laterality: N/A;  . EGD  1999  . KYPHOPLASTY N/A 08/20/2018   Procedure: Lumbar three KYPHOPLASTY;  Surgeon: Ashok Pall, MD;  Location: [place];  Service: Neurosurgery;  Laterality: N/A;  . PANCREATIC CYST DRAINAGE     x 2  . ROOT CANAL    . s/p sinus surgury    . TONSILLECTOMY       Family History: Family History  Problem Relation Age of Onset  . Heart disease Mother   . Heart attack Mother        first one age 95  . Emphysema Father        was a smoker  . Stroke Father   . Breast cancer Daughter   . Lymphoma Sister   . Colon cancer Neg Hx   . Esophageal cancer Neg Hx   . Rectal cancer Neg Hx      Social History: Social History   Socioeconomic History  . Marital status: Widowed    Spouse name: Not on file  . Number of children: 2  . Years of education: Not on file  . Highest education level: Not on file  Occupational History  . Occupation: retired    Fish farm manager: RETIRED  Social Needs  . Financial resource strain: Not on file  . Food insecurity:    Worry: Not on file  Inability: Not on file  . Transportation needs:    Medical: Not on file    Non-medical: Not on file  Tobacco Use  . Smoking status: Never Smoker  . Smokeless tobacco: Never Used  . Tobacco comment: spouse smoked in the home for 15 years  Substance and Sexual Activity  . Alcohol use: No  . Drug use: No  . Sexual activity: Not Currently  Lifestyle  . Physical activity:    Days per week: Not on file    Minutes per session: Not on file  . Stress: Not on file  Relationships  . Social connections:    Talks on phone: Not on file    Gets together: Not on file    Attends religious service: Not on file    Active member of club or organization: Not on file    Attends meetings of clubs or organizations: Not on file    Relationship  status: Not on file  . Intimate partner violence:    Fear of current or ex partner: Not on file    Emotionally abused: Not on file    Physically abused: Not on file    Forced sexual activity: Not on file  Other Topics Concern  . Not on file  Social History Narrative   Drinks green tea     Review of Systems: Review of Systems  Constitutional: Positive for chills, fever, malaise/fatigue and weight loss. Negative for diaphoresis.  HENT: Negative.  Negative for congestion, ear discharge, ear pain, hearing loss, nosebleeds, sinus pain, sore throat and tinnitus.   Eyes: Negative.  Negative for blurred vision, double vision, photophobia, pain, discharge and redness.  Respiratory: Positive for cough, sputum production, shortness of breath and wheezing. Negative for hemoptysis and stridor.   Cardiovascular: Negative.  Negative for chest pain, palpitations, orthopnea, claudication, leg swelling and PND.  Gastrointestinal: Positive for nausea. Negative for abdominal pain, blood in stool, constipation, diarrhea, heartburn, melena and vomiting.  Genitourinary: Negative.  Negative for dysuria, flank pain, frequency, hematuria and urgency.  Musculoskeletal: Negative.  Negative for back pain, falls, joint pain, myalgias and neck pain.  Skin: Negative for itching and rash.  Neurological: Negative.  Negative for dizziness, tingling, tremors, sensory change, speech change, focal weakness, seizures, loss of consciousness, weakness and headaches.  Endo/Heme/Allergies: Negative.  Negative for environmental allergies and polydipsia. Does not bruise/bleed easily.  Psychiatric/Behavioral: Negative.  Negative for depression, hallucinations, memory loss, substance abuse and suicidal ideas. The patient is not nervous/anxious and does not have insomnia.     Vital Signs: Blood pressure 138/64, pulse (!) 101, temperature 97.8 F (36.6 C), temperature source Oral, resp. rate 20, height 5\' 4"  (1.626 m), weight 59 kg,  SpO2 95 %.  Weight trends: Filed Weights   11/16/2018 1038 11/12/2018 2252 12/06/18 0408  Weight: 58.7 kg 57.3 kg 59 kg    Physical Exam: General: NAD,   Head: Normocephalic, atraumatic. Dry oral mucosal membranes  Eyes: Anicteric, PERRL  Neck: Supple, trachea midline  Lungs:  +wheezes, coarse upper respiratory sounds.   Heart: Regular rate and rhythm  Abdomen:  Soft, nontender,   Extremities: no peripheral edema.  Neurologic: Nonfocal, moving all four extremities  Skin: No lesions         Lab results: Basic Metabolic Panel: Recent Labs  Lab 12/05/18 0413 12/05/18 1604  12/05/18 2228 12/06/18 0404 12/06/18 1054  NA 114* 115*   < > 114* 118* 121*  K 3.3* 3.5  --   --  4.3  --  CL 80* 82*  --   --  89*  --   CO2 22 22  --   --  23  --   GLUCOSE 155* 183*  --   --  162*  --   BUN 14 16  --   --  18  --   CREATININE 0.30* 0.49  --   --  0.49  --   CALCIUM 8.4* 8.3*  --   --  8.4*  --   MG  --   --   --   --  1.7  --   PHOS  --   --   --   --  1.9*  --    < > = values in this interval not displayed.    Liver Function Tests: Recent Labs  Lab 11/16/2018 1131  AST 32  ALT 25  ALKPHOS 99  BILITOT 0.4  PROT 7.5  ALBUMIN 3.7   No results for input(s): LIPASE, AMYLASE in the last 168 hours. No results for input(s): AMMONIA in the last 168 hours.  CBC: Recent Labs  Lab 11/16/2018 1131 12/05/18 0413 12/06/18 0404  WBC 10.0 5.1 15.3*  NEUTROABS 8.5*  --   --   HGB 14.4 12.3 13.1  HCT 40.3 34.3* 37.8  MCV 87.2 88.2 91.5  PLT 228 199 214    Cardiac Enzymes: No results for input(s): CKTOTAL, CKMB, CKMBINDEX, TROPONINI in the last 168 hours.  BNP: Invalid input(s): POCBNP  CBG: No results for input(s): GLUCAP in the last 168 hours.  Microbiology: Results for orders placed or performed during the hospital encounter of 11/28/2018  Culture, blood (Routine x 2)     Status: None (Preliminary result)   Collection Time: 11/08/2018 11:31 AM  Result Value Ref Range  Status   Specimen Description BLOOD BLOOD RIGHT ARM  Final   Special Requests   Final    BOTTLES DRAWN AEROBIC AND ANAEROBIC Blood Culture adequate volume   Culture   Final    NO GROWTH 2 DAYS Performed at [facility], [address], [place], [place] [address]    Report Status PENDING  Incomplete  Culture, blood (Routine x 2)     Status: None (Preliminary result)   Collection Time: 12/02/2018 11:31 AM  Result Value Ref Range Status   Specimen Description BLOOD LEFT ANTECUBITAL  Final   Special Requests   Final    BOTTLES DRAWN AEROBIC AND ANAEROBIC Blood Culture results may not be optimal due to an excessive volume of blood received in culture bottles   Culture   Final    NO GROWTH 2 DAYS Performed at [facility], [address] [place], [place] [address]    Report Status PENDING  Incomplete    Coagulation Studies: Recent Labs    11/11/2018 1131 12/05/18 0413 12/06/18 0404  LABPROT 34.6* 36.3* 33.5*  INR 3.5* 3.7* 3.4*    Urinalysis: Recent Labs    11/12/2018 1517  COLORURINE YELLOW*  LABSPEC 1.013  PHURINE 6.0  GLUCOSEU >=500*  HGBUR NEGATIVE  BILIRUBINUR NEGATIVE  KETONESUR 20*  PROTEINUR NEGATIVE  NITRITE NEGATIVE  LEUKOCYTESUR NEGATIVE      Imaging:  No results found.   Assessment & Plan: Ms. TYLIE GOLONKA is a 85 y.o. white female with CVA, PE, hypertension, hyperlipidemia, GERD, coronary artery disease, COPD, depression, who was admitted to [facility] on 12/02/2018 for Bronchiectasis with acute exacerbation ([place]) [J47.1] Hyponatremia [E87.1] Fatigue, unspecified type [R53.83]  1. Hyponatremia: acute on chronic. Baseline  of 131 on 09/01/18 Acute hyponatremia secondary to hypovolemia and pulmonary issues. - Continue hypertonic saline - Serial sodium and neuro checks.  - Hold sertraline  2. Hypertension: urgency on admission.  - losartan, hydralazine, amlodipine, diltiazem.   3. Acute respiratory failure: secondary to acute  bronchiectaiss - doxycycline - solumedrol.  - O2.   4. History of pulmonary embolism - warfarin  5. Depression: patient was not taking sertraline at home. Continue to hold due to hyponatremia.   6. Hypokalemia - PO potassium chloride.   LOS: 2 Nephtali Docken 2/29/202012:02 PM

## 2018-12-06 NOTE — Progress Notes (Signed)
NURSE PRACTITIONER Louann Liv was notified twice about patient's sodium level this shift at 2340 and 0520.  She was also notified about patient;s osmolality level at 2340.

## 2018-12-07 ENCOUNTER — Inpatient Hospital Stay: Payer: Medicare Other

## 2018-12-07 LAB — PROTIME-INR
INR: 3.2 — ABNORMAL HIGH (ref 0.8–1.2)
Prothrombin Time: 32.5 seconds — ABNORMAL HIGH (ref 11.4–15.2)

## 2018-12-07 LAB — BASIC METABOLIC PANEL
Anion gap: 6 (ref 5–15)
BUN: 22 mg/dL (ref 8–23)
CHLORIDE: 98 mmol/L (ref 98–111)
CO2: 22 mmol/L (ref 22–32)
CREATININE: 0.46 mg/dL (ref 0.44–1.00)
Calcium: 8.4 mg/dL — ABNORMAL LOW (ref 8.9–10.3)
GFR calc Af Amer: >60 mL/min (ref >60)
GFR calc non Af Amer: >60 mL/min (ref >60)
Glucose, Bld: 160 mg/dL — ABNORMAL HIGH (ref 70–99)
Potassium: 4.5 mmol/L (ref 3.5–5.1)
Sodium: 126 mmol/L — ABNORMAL LOW (ref 135–145)

## 2018-12-07 LAB — CBC
HCT: 34.5 % — ABNORMAL LOW (ref 36.0–46.0)
Hemoglobin: 12.8 g/dL (ref 12.0–15.0)
MCH: 35.1 pg — ABNORMAL HIGH (ref 26.0–34.0)
MCHC: 37.1 g/dL — ABNORMAL HIGH (ref 30.0–36.0)
MCV: 94.5 fL (ref 80.0–100.0)
Platelets: 221 10*3/uL (ref 150–400)
RBC: 3.65 MIL/uL — ABNORMAL LOW (ref 3.87–5.11)
RDW: 13.2 % (ref 11.5–15.5)
WBC: 18 10*3/uL — ABNORMAL HIGH (ref 4.0–10.5)
nRBC: 0 % (ref 0.0–0.2)

## 2018-12-07 LAB — SODIUM
Sodium: 124 mmol/L — ABNORMAL LOW (ref 135–145)
Sodium: 123 mmol/L — ABNORMAL LOW (ref 135–145)
Sodium: 126 mmol/L — ABNORMAL LOW (ref 135–145)

## 2018-12-07 LAB — PROCALCITONIN: Procalcitonin: <0.1 ng/mL

## 2018-12-07 MED ORDER — FUROSEMIDE 10 MG/ML IJ SOLN
40.0000 mg | Freq: Once | INTRAMUSCULAR | Status: AC
  Administered 2018-12-07: 40 mg via INTRAVENOUS
  Filled 2018-12-07: qty 4

## 2018-12-07 MED ORDER — SODIUM CHLORIDE 0.9% FLUSH
10.0000 mL | Freq: Two times a day (BID) | INTRAVENOUS | Status: DC
  Administered 2018-12-07 – 2018-12-11 (×8): 10 mL via INTRAVENOUS

## 2018-12-07 MED ORDER — SODIUM CHLORIDE 0.9 % IV SOLN
INTRAVENOUS | Status: DC | PRN
  Administered 2018-12-07 – 2018-12-08 (×2): 500 mL via INTRAVENOUS

## 2018-12-07 NOTE — Consult Note (Signed)
ANTICOAGULATION CONSULT NOTE - Follow up [place] for Warfarin Dosing Indication: pulmonary embolus  Allergies  Allergen Reactions  . Raloxifene Other (See Comments)    REACTION: risk of recurrent stroke  . Sulfonamide Derivatives Anaphylaxis    REACTION: tongue swells  . Amoxicillin-Pot Clavulanate Diarrhea    REACTION: diarrhea Has patient had a PCN reaction causing immediate rash, facial/tongue/throat swelling, SOB or lightheadedness with hypotension: unknown Has patient had a PCN reaction causing severe rash involving mucus membranes or skin necrosis: unknown Has patient had a PCN reaction that required hospitalization : unknown Has patient had a PCN reaction occurring within the last 10 years: yes   pt states she should be able to take penicillin, but cant remember actually taking it   . Ciclesonide Other (See Comments)    REACTION: bad smell  . Ciprofloxacin Other (See Comments)    dizziness   Patient Measurements: Height: 5\' 4"  (162.6 cm) Weight: (pt. complained back pain, could mot lie flat for weight) IBW/kg (Calculated) : 54.7  Vital Signs: Temp: 97.5 F (36.4 C) (03/01 0833) Temp Source: Oral (03/01 0833) BP: 134/49 (03/01 0833) Pulse Rate: 87 (03/01 0833)  Labs: Recent Labs    12/05/18 0413 12/05/18 1604 12/06/18 0404 12/07/18 0434  HGB 12.3  --  13.1 12.8  HCT 34.3*  --  37.8 34.5*  PLT 199  --  214 221  LABPROT 36.3*  --  33.5* 32.5*  INR 3.7*  --  3.4* 3.2*  CREATININE 0.30* 0.49 0.49 0.46    Estimated Creatinine Clearance: 43.6 mL/min (by C-G formula based on SCr of 0.46 mg/dL).   Assessment: Pharmacy consulted for warfarin dosing and monitor in 85 yo female with PMH of PE. Patient is being admitted with acute respiratory failure secondary to acute bronchiectasis and is receiving Doxycycline BID. INR on admission if supratherapeutic  @ 3.5. Last dose reported to be 2/26 PM.    Home Regimen: Warfarin 2.5mg  Tues, Weds, Thurs,  Sat, Sun                             Warfarin 5mg  Mon, Fri   DATE INR DOSE 2/27 3.5  held 2/28 3.7 held 2/29  3.4 held 3/01 3.2 held  Goal of Therapy:  INR 2-3 Monitor platelets by anticoagulation protocol: Yes   Plan:  Due to supratherapeutic INR, warfarin will be held tonight.   Will recheck INR daily while on antibiotics per protocol, and CBCs at least every 3 days.   Pharmacy will F/U with AM labs and continue to order warfarin dose based on INRs.   , L 12/07/2018 12:22 PM

## 2018-12-07 NOTE — Progress Notes (Addendum)
[facility] [place] NAME: [name]    MR#:  [id number]  DATE OF BIRTH:  05-21-1930  SUBJECTIVE:  CHIEF COMPLAINT:   Chief Complaint  Patient presents with  . Shortness of Breath  . Cough   Family at bedside. Improved mental status today per patient and family report. Shortness of breath worsened. Pain in L anterior leg remains, only present if pressed on per patient. Na 124 most recent. Otherwise slept well, appetite good, no other complaints. Worked with PT yesterday.  REVIEW OF SYSTEMS:  Review of Systems  Constitutional: Negative forchillsand fever. Positive for malaise/fatigue.  HENT: Negative forhearing lossand tinnitus.  Eyes: Negative forblurred visionand double vision.  Respiratory: Positive forcoughand shortness of breath.  Cardiovascular: Negative forchest painand palpitations.  Gastrointestinal: Negative forheartburn,nauseaand vomiting.  Genitourinary: Negative fordysuriaand urgency.  Musculoskeletal: Positive formyalgia at left anterior lower extremity (may have hit leg with walker at home). Only with pressure.  Skin: Negative foritchingand rash. Positive for bruising (on warfarin) Neurological: Negative fordizziness,seizuresand headaches.  Psychiatric/Behavioral: Negative fordepressionand hallucinations. Confusion improved. DRUG ALLERGIES:   Allergies  Allergen Reactions  . Raloxifene Other (See Comments)    REACTION: risk of recurrent stroke  . Sulfonamide Derivatives Anaphylaxis    REACTION: tongue swells  . Amoxicillin-Pot Clavulanate Diarrhea    REACTION: diarrhea Has patient had a PCN reaction causing immediate rash, facial/tongue/throat swelling, SOB or lightheadedness with hypotension: unknown Has patient had a PCN reaction causing severe rash involving mucus membranes or skin necrosis: unknown Has patient had a PCN reaction that required hospitalization : unknown Has  patient had a PCN reaction occurring within the last 10 years: yes   pt states she should be able to take penicillin, but cant remember actually taking it   . Ciclesonide Other (See Comments)    REACTION: bad smell  . Ciprofloxacin Other (See Comments)    dizziness   VITALS:  Blood pressure (!) 134/49, pulse 87, temperature (!) 97.5 F (36.4 C), temperature source Oral, resp. rate 19, height 5\' 4"  (1.626 m), weight 59 kg, SpO2 95 %. PHYSICAL EXAMINATION:  Physical Exam Constitutional: She isoriented to person, place, and time. She appearswell-developedand well-nourished.  HENT:  Head:Normocephalicand atraumatic.  Right Ear: External earnormal.  Mouth/Throat:Oropharynx is clear and moist.  Eyes:Pupils are equal, round, and reactive to light. Conjunctivaeand EOMare normal. Right eye exhibits no discharge. Neck:Normal range of motion.Neck supple.No tracheal deviationpresent.  Cardiovascular:Normal rate,regular rhythmand normal heart sounds. Respiratory:There are diffuse crackles present. Effort normal at rest. Norespiratory distress.  KZ:LDJT.Bowel sounds are normal. She exhibitsno distension. There isno abdominal tenderness.  Musculoskeletal:Tender to palpation at left anterior lower extremity. Normal range of motion.  General: No edema.  Neurological: She isalertand oriented to person, place, and time. Nocranial nerve deficit.  Skin: Scattered ecchymoses. Skin iswarm. She isnot diaphoretic. Noerythema.  Psychiatric: She has anormal mood and affect. LABORATORY PANEL:  Female CBC Recent Labs  Lab 12/07/18 0434  WBC 18.0*  HGB 12.8  HCT 34.5*  PLT 221   ------------------------------------------------------------------------------------------------------------------ Chemistries  Recent Labs  Lab 11/19/2018 1131  12/06/18 0404  12/07/18 0434 12/07/18 1056  NA 112*   < > 118*   < > 126* 124*  K 4.1   < > 4.3  --  4.5  --   CL 77*   < > 89*   --  98  --   CO2 24   < > 23  --  22  --   GLUCOSE  145*   < > 162*  --  160*  --   BUN 13   < > 18  --  22  --   CREATININE 0.36*   < > 0.49  --  0.46  --   CALCIUM 8.7*   < > 8.4*  --  8.4*  --   MG  --   --  1.7  --   --   --   AST 32  --   --   --   --   --   ALT 25  --   --   --   --   --   ALKPHOS 99  --   --   --   --   --   BILITOT 0.4  --   --   --   --   --    < > = values in this interval not displayed.   RADIOLOGY:  Dg Chest Port 1 View  Result Date: 12/07/2018 CLINICAL DATA:  Acute shortness of breath EXAM: PORTABLE CHEST 1 VIEW COMPARISON:  11/09/2018 chest radiograph and prior studies FINDINGS: New RIGHT LOWER lung consolidation versus atelectasis noted. RIGHT perihilar opacity is unchanged. Cardiomediastinal silhouette is otherwise unremarkable. A trace RIGHT pleural effusion is present. No pneumothorax or acute bony abnormality. IMPRESSION: New RIGHT LOWER lung consolidation versus atelectasis and trace RIGHT pleural effusion. No other significant changes. Electronically Signed   By: Margarette Canada M.D.   On: 12/07/2018 13:36   ASSESSMENT AND PLAN:   Tabitha Blacklock Schillois a 89 year oldfemalewith history of CVA, PE, hypertension, hyperlipidemia, GERD, coronary artery disease, COPD, depression, who was admitted to [facility] on2/27/2020for Bronchiectasis with acute exacerbation, hyponatremia, and generalized fatigue.  Plan:  1.Acute respiratory failure suspect this is due to acute bronchiectasis ED physician spoke to Dr. Stoney Bang who recommended doxycycline. Continue doxycycline for now. Chest x-ray initial with findings of diffuse bilateral interstitial prominence with findings suggestive of acute interstitial process including pneumonitis or interstitial edema. Underlying chronic interstitial lung disease most likely present. Continue nebulizer treatments. Antibiotics. Solumedrol. Monitor clinically. - CXR portable 12/08/2018 repeat - shows new consolidation vs. Atelectasis,  right pleural effusion - Concern for iatrogenic fluid overload, will defer to Nephrology for initiation of diuretics tomorrow WBC elevated to 18 today. Patient is on steroids. Repeat CBC tomorrow AM.  Will reassess clinically tomorrow for additional acute pulmonary process, consider repeat 2 view CXR.   2.Severe hyponatremiawith initial sodium of 112 Likely due tounderlying pulmonary process No seizures. Had some mild confusion which is reported to be improved. Repeat sodium this morning was 126. Repeat later today was 124. Goal for 130 or above. Daily BMP monitoring.  - Nephrology/Dr. Juleen China following: discontinued hypertonic saline, serial sodium and neuro checks. Hold sertraline and diuretics. Nephrology discontinued normal saline.  PO intake. Serum osmolality of 245. Urine sodium of 21. Urine osmolality of 277.  TSH 0.387 and cortisol level 12.0  3.Hypertensive urgency on presentation  Blood pressure appears better controlled this morning on current regimen. Continue amlodipine, diltiazem, hydralazine, losartan  4.Sinus tachycardia due to underlying lung disease;resolved  5.Depression hold sertraline for hyponatremia  6.History of PE continue Coumadin INR is slightly supratherapeutic at 3.2.Goal 2-3. Pharmacy following. Warfarin to be held tonight.   DVT prophylaxis;patient already on Coumadin. See above.   All the records are reviewed and case is discussed with Care Management/Social Worker. Management plans discussed with the patient and/or family and they are in agreement.  CODE STATUS: Full Code  TOTAL TIME TAKING CARE OF THIS PATIENT: 30 minutes.   More than 50% of the time was spent in counseling/coordination of care: YES  POSSIBLE D/C IN 2-3 DAYS, DEPENDING ON CLINICAL CONDITION.   Shonique Pelphrey PA-C on 12/07/2018 at 2:52 PM  Between 7am to 6pm - Pager - [phone]  After 6 pm go to [email or web address] - Proofreader  [employer]  [facility]  Office  [phone]  CC: Primary care physician; [name], MD  Note: This dictation was prepared with Dragon dictation along with smaller phrase technology. Any transcriptional errors that result from this process are unintentional.

## 2018-12-07 NOTE — Progress Notes (Signed)
[facility]  ROUNDING NOTE   Subjective:   Na 126  NS at 113mL/hr transitioned off hypertonic yesterday afternoon.   Daughter at bedside.   Objective:  Vital signs in last 24 hours:  Temp:  [97.5 F (36.4 C)-98.2 F (36.8 C)] 97.5 F (36.4 C) (03/01 0833) Pulse Rate:  [83-87] 87 (03/01 0833) Resp:  [18-19] 19 (03/01 0511) BP: (128-137)/(49-64) 134/49 (03/01 0833) SpO2:  [93 %-96 %] 95 % (03/01 0833)  Weight change:  Filed Weights   11/27/2018 1038 11/11/2018 2252 12/06/18 0408  Weight: 58.7 kg 57.3 kg 59 kg    Intake/Output: I/O last 3 completed shifts: In: 260 [P.O.:260] Out: 950 [Urine:950]   Intake/Output this shift:  Total I/O In: 240 [P.O.:240] Out: -   Physical Exam: General: NAD,   Head: Normocephalic, atraumatic. Moist oral mucosal membranes  Eyes: Anicteric, PERRL  Neck: Supple, trachea midline  Lungs:  Bilateral crackles  Heart: Regular rate and rhythm  Abdomen:  Soft, nontender,   Extremities:  no peripheral edema.  Neurologic: Nonfocal, moving all four extremities  Skin: No lesions        Basic Metabolic Panel: Recent Labs  Lab 11/22/2018 1828 12/05/18 0413 12/05/18 1604  12/06/18 0404 12/06/18 1054 12/06/18 1631 12/06/18 2217 12/07/18 0434  NA 112* 114* 115*   < > 118* 121* 123* 124* 126*  K 3.7 3.3* 3.5  --  4.3  --   --   --  4.5  CL 79* 80* 82*  --  89*  --   --   --  98  CO2 23 22 22   --  23  --   --   --  22  GLUCOSE 208* 155* 183*  --  162*  --   --   --  160*  BUN 11 14 16   --  18  --   --   --  22  CREATININE 0.31* 0.30* 0.49  --  0.49  --   --   --  0.46  CALCIUM 8.4* 8.4* 8.3*  --  8.4*  --   --   --  8.4*  MG  --   --   --   --  1.7  --   --   --   --   PHOS  --   --   --   --  1.9*  --   --   --   --    < > = values in this interval not displayed.    Liver Function Tests: Recent Labs  Lab 11/26/2018 1131  AST 32  ALT 25  ALKPHOS 99  BILITOT 0.4  PROT 7.5  ALBUMIN 3.7   No results for input(s):  LIPASE, AMYLASE in the last 168 hours. No results for input(s): AMMONIA in the last 168 hours.  CBC: Recent Labs  Lab 11/26/2018 1131 12/05/18 0413 12/06/18 0404 12/07/18 0434  WBC 10.0 5.1 15.3* 18.0*  NEUTROABS 8.5*  --   --   --   HGB 14.4 12.3 13.1 12.8  HCT 40.3 34.3* 37.8 34.5*  MCV 87.2 88.2 91.5 94.5  PLT 228 199 214 221    Cardiac Enzymes: No results for input(s): CKTOTAL, CKMB, CKMBINDEX, TROPONINI in the last 168 hours.  BNP: Invalid input(s): POCBNP  CBG: No results for input(s): GLUCAP in the last 168 hours.  Microbiology: Results for orders placed or performed during the hospital encounter of 11/10/2018  Culture, blood (Routine x 2)  Status: None (Preliminary result)   Collection Time: 11/30/2018 11:31 AM  Result Value Ref Range Status   Specimen Description BLOOD BLOOD RIGHT ARM  Final   Special Requests   Final    BOTTLES DRAWN AEROBIC AND ANAEROBIC Blood Culture adequate volume   Culture   Final    NO GROWTH 3 DAYS Performed at [facility], [address], [place], [place] [address]    Report Status PENDING  Incomplete  Culture, blood (Routine x 2)     Status: None (Preliminary result)   Collection Time: 12/02/2018 11:31 AM  Result Value Ref Range Status   Specimen Description BLOOD LEFT ANTECUBITAL  Final   Special Requests   Final    BOTTLES DRAWN AEROBIC AND ANAEROBIC Blood Culture results may not be optimal due to an excessive volume of blood received in culture bottles   Culture   Final    NO GROWTH 3 DAYS Performed at [facility], [address] [place], [place] [address]    Report Status PENDING  Incomplete  Culture, sputum-assessment     Status: None   Collection Time: 12/02/2018  5:23 PM  Result Value Ref Range Status   Specimen Description SPUTUM  Final   Special Requests NONE  Final   Sputum evaluation   Final    THIS SPECIMEN IS ACCEPTABLE FOR SPUTUM CULTURE Performed at [facility], [address], [place], [place] [address]    Report Status 12/06/2018 FINAL  Final  Culture, respiratory     Status: None (Preliminary result)   Collection Time: 11/25/2018  5:23 PM  Result Value Ref Range Status   Specimen Description   Final    SPUTUM Performed at [facility], [address], [place], [place] [address]    Special Requests   Final    NONE Reflexed from [id number] Performed at [facility], [address] [place], [place] [address]    Gram Stain   Final    FEW WBC PRESENT, PREDOMINANTLY PMN FEW GRAM NEGATIVE RODS FEW YEAST FEW GRAM POSITIVE COCCI IN CLUSTERS Performed at [facility], [place] [address], [place],  [address]    Culture PENDING  Incomplete   Report Status PENDING  Incomplete    Coagulation Studies: Recent Labs    11/27/2018 1131 12/05/18 0413 12/06/18 0404 12/07/18 0434  LABPROT 34.6* 36.3* 33.5* 32.5*  INR 3.5* 3.7* 3.4* 3.2*    Urinalysis: Recent Labs    12/02/2018 1517  COLORURINE YELLOW*  LABSPEC 1.013  PHURINE 6.0  GLUCOSEU >=500*  HGBUR NEGATIVE  BILIRUBINUR NEGATIVE  KETONESUR 20*  PROTEINUR NEGATIVE  NITRITE NEGATIVE  LEUKOCYTESUR NEGATIVE      Imaging: No results found.   Medications:   . sodium chloride 100 mL/hr at 12/06/18 2302  . doxycycline (VIBRAMYCIN) IV 100 mg (12/06/18 2312)   . acidophilus   Oral Daily  . amLODipine  5 mg Oral Daily  . budesonide (PULMICORT) nebulizer solution  0.25 mg Nebulization BID  . diltiazem  60 mg Oral Q8H  . fluticasone  2 spray Each Nare Daily  . guaiFENesin  600 mg Oral BID  . ipratropium-albuterol  3 mL Nebulization Q6H  . losartan  100 mg Oral Daily  . methylPREDNISolone (SOLU-MEDROL) injection  60 mg Intravenous Q6H  . multivitamin with minerals  1 tablet Oral Daily  . Warfarin - Pharmacist Dosing Inpatient   Does not apply q1800   acetaminophen **OR** acetaminophen, cyclobenzaprine, docusate sodium, hydrALAZINE, loratadine,  ondansetron **OR** ondansetron  (ZOFRAN) IV, promethazine  Assessment/ Plan:  Ms. [name] is a 85 y.o. white female  with CVA, PE, hypertension, hyperlipidemia, GERD, coronary artery disease, COPD, depression, who was admitted to [facility] on 11/15/2018 for Bronchiectasis with acute exacerbation and Hyponatremia   1. Hyponatremia: acute on chronic. Baseline of 131 on 09/01/18 Acute hyponatremia secondary to hypovolemia and pulmonary issues. - Discontinue normal saline infusion - Hold sertraline  2. Hypertension: urgency on admission.  - losartan, hydralazine, amlodipine, diltiazem.   3. Acute respiratory failure: secondary to acute bronchiectasis - Order CXR portable. May need diuretic due to volume expansion - doxycycline - solumedrol.  - O2.   4. History of pulmonary embolism - warfarin  5. Depression: patient was not taking sertraline at home. Continue to hold due to hyponatremia.   6. Hypokalemia - PO potassium chloride.    LOS: 3 Adithya Difrancesco 3/1/202010:13 AM

## 2018-12-07 NOTE — NC FL2 (Signed)
[place] LEVEL OF CARE SCREENING TOOL     IDENTIFICATION  Patient Name: [name] Birthdate: 19-Jul-1930 Sex: female Admission Date (Current Location): 11/19/2018  [place] and [place] Number:  Engineering geologist and Address:  [facility], [address] [place], [place] [address]      Provider Number: [id number]  Attending Physician Name and Address:  Max Sane, MD  Relative Name and Phone Number:       Current Level of Care: Hospital Recommended Level of Care: [place] Prior Approval Number:    Date Approved/Denied:   PASRR Number: [id number]  Discharge Plan: SNF    Current Diagnoses: Patient Active Problem List   Diagnosis Date Noted  . SOB (shortness of breath) 11/24/2018  . Pneumonia of right middle lobe due to infectious organism ([place]) 09/08/2018  . Compression fracture of T12 vertebra with routine healing 09/01/2018  . Chronic obstructive pulmonary disease ([place]) 09/01/2018  . Supratherapeutic INR 09/01/2018  . Gait disorder 09/01/2018  . Peripheral edema 09/01/2018  . CAP (community acquired pneumonia) 08/26/2018  . Hyponatremia 08/26/2018  . History of vertebral compression fracture 08/26/2018  . Compression fracture of L3 vertebra (HCC) 08/20/2018  . Acute bilateral thoracic back pain 05/26/2018  . Cough 11/20/2017  . Abdominal pain 10/15/2017  . Wheezing 08/06/2017  . General weakness 08/02/2016  . Encounter for therapeutic drug monitoring 12/16/2013  . Acute upper respiratory infection 12/16/2013  . Nonspecific abnormal finding in stool contents 10/19/2013  . Benign neoplasm of colon 10/19/2013  . Urinary frequency 08/28/2013  . Hematochezia 05/26/2013  . Chest pain 11/10/2012  . Vertigo 05/28/2012  . Trapezius strain 01/10/2012  . Chronic respiratory failure with hypoxia ([place]) 12/21/2011  . Bronchiectasis ([place]) 12/11/2011  . Fatigue 11/29/2011  . Preventative health care  09/22/2011  . Impaired glucose tolerance 09/21/2011  . Internal hemorrhoids without complication 92/42/6834  . Palpitations 05/21/2011  . Long term (current) use of anticoagulants 01/03/2011  . History of pulmonary embolism 12/08/2010  . FLATULENCE-GAS-BLOATING 07/03/2010  . ABNORMAL FINDINGS GI TRACT 07/03/2010  . WEIGHT LOSS-ABNORMAL 06/06/2010  . NAUSEA 06/06/2010  . ABDOMINAL PAIN-PERIUMBILICAL 19/62/2297  . ABDOMINAL PAIN-EPIGASTRIC 06/06/2010  . ABNORMAL EXAM-BILIARY TRACT 06/06/2010  . DIZZINESS, CHRONIC 10/14/2009  . OVERACTIVE BLADDER 05/26/2009  . CORONARY ARTERY DISEASE 04/06/2009  . COLONIC POLYPS, HX OF 04/06/2009  . Chronic rhinitis 11/22/2008  . Dysuria 05/27/2008  . HEMATOCHEZIA 05/07/2008  . PULMONARY HYPERTENSION, SECONDARY 01/15/2008  . Dyspnea 12/30/2007  . GAIT IMBALANCE 11/26/2007  . Unspecified hearing loss 10/17/2007  . Hyperlipidemia 08/05/2007  . ANXIETY 08/05/2007  . Depression 08/05/2007  . COMMON MIGRAINE 08/05/2007  . CAROTID ARTERY STENOSIS, RIGHT 08/05/2007  . Allergic rhinitis 08/05/2007  . DIVERTICULOSIS, COLON 08/05/2007  . CYST/PSEUDOCYST, PANCREAS 08/05/2007  . OSTEOARTHRITIS, KNEE, RIGHT 08/05/2007  . Personal History of Other Diseases of Digestive Disease 08/05/2007  . Essential hypertension 03/11/2007  . GERD 03/11/2007  . OSTEOPOROSIS 03/11/2007  . TRANSIENT ISCHEMIC ATTACK, HX OF 03/11/2007    Orientation RESPIRATION BLADDER Height & Weight     Self, Time, Situation, Place  Normal, O2(2 liters) Incontinent Weight: (pt. complained back pain, could mot lie flat for weight) Height:  5\' 4"  (162.6 cm)  BEHAVIORAL SYMPTOMS/MOOD NEUROLOGICAL BOWEL NUTRITION STATUS  (none) (none) Continent Diet(reg)  AMBULATORY STATUS COMMUNICATION OF NEEDS Skin   Limited Assist Verbally Normal  Personal Care Assistance Level of Assistance  Bathing, Dressing Bathing Assistance: Limited assistance   Dressing Assistance:  Limited assistance     Functional Limitations Info  Sight Sight Info: Impaired        SPECIAL CARE FACTORS FREQUENCY  PT (By licensed PT)                    Contractures Contractures Info: Not present    Additional Factors Info  Code Status Code Status Info: full             Current Medications (12/07/2018):  This is the current hospital active medication list Current Facility-Administered Medications  Medication Dose Route Frequency Provider Last Rate Last Dose  . acetaminophen (TYLENOL) tablet 650 mg  650 mg Oral Q6H PRN Dustin Flock, MD       Or  . acetaminophen (TYLENOL) suppository 650 mg  650 mg Rectal Q6H PRN Dustin Flock, MD      . acidophilus (RISAQUAD) capsule   Oral Daily Dustin Flock, MD      . amLODipine (NORVASC) tablet 5 mg  5 mg Oral Daily Dustin Flock, MD   5 mg at 12/07/18 1121  . budesonide (PULMICORT) nebulizer solution 0.25 mg  0.25 mg Nebulization BID Dustin Flock, MD   0.25 mg at 12/06/18 2134  . cyclobenzaprine (FLEXERIL) tablet 10 mg  10 mg Oral TID PRN Dustin Flock, MD   10 mg at 12/05/18 2120  . diltiazem (CARDIZEM) tablet 60 mg  60 mg Oral Q8H Dustin Flock, MD   60 mg at 12/07/18 0617  . docusate sodium (COLACE) capsule 100 mg  100 mg Oral Daily PRN Lule, Joana, PA      . doxycycline (VIBRAMYCIN) 100 mg in sodium chloride 0.9 % 250 mL IVPB  100 mg Intravenous Q12H Dustin Flock, MD 125 mL/hr at 12/07/18 1125 100 mg at 12/07/18 1125  . fluticasone (FLONASE) 50 MCG/ACT nasal spray 2 spray  2 spray Each Nare Daily Dustin Flock, MD   2 spray at 12/07/18 1124  . guaiFENesin (MUCINEX) 12 hr tablet 600 mg  600 mg Oral BID Dustin Flock, MD   600 mg at 12/07/18 1122  . hydrALAZINE (APRESOLINE) injection 10 mg  10 mg Intravenous Q6H PRN Dustin Flock, MD      . ipratropium-albuterol (DUONEB) 0.5-2.5 (3) MG/3ML nebulizer solution 3 mL  3 mL Nebulization Q6H Dustin Flock, MD   3 mL at 12/06/18 2134  . loratadine  (CLARITIN) tablet 10 mg  10 mg Oral Daily PRN Dustin Flock, MD      . losartan (COZAAR) tablet 100 mg  100 mg Oral Daily Dustin Flock, MD   100 mg at 12/06/18 1114  . methylPREDNISolone sodium succinate (SOLU-MEDROL) 125 mg/2 mL injection 60 mg  60 mg Intravenous Q6H Dustin Flock, MD   60 mg at 12/07/18 1120  . multivitamin with minerals tablet 1 tablet  1 tablet Oral Daily Dustin Flock, MD   1 tablet at 12/07/18 1123  . ondansetron (ZOFRAN) tablet 4 mg  4 mg Oral Q6H PRN Dustin Flock, MD       Or  . ondansetron (ZOFRAN) injection 4 mg  4 mg Intravenous Q6H PRN Dustin Flock, MD      . promethazine (PHENERGAN) tablet 25 mg  25 mg Oral Q6H PRN Dustin Flock, MD      . Warfarin - Pharmacist Dosing Inpatient   Does not apply [address] [place], [facility]  Discharge Medications: Please see discharge summary for a list of discharge medications.  Relevant Imaging Results:  Relevant Lab Results:   Additional Information ss: [id number]  Shela Leff, LCSW

## 2018-12-07 NOTE — Clinical Social Work Note (Addendum)
Clinical Social Work Assessment  Patient Details  Name: [name] MRN: [id number] Date of Birth: 1929/10/11  Date of referral:  12/07/18               Reason for consult:  Facility Placement                Permission sought to share information with:    Permission granted to share information::     Name::        Agency::     Relationship::     Contact Information:     Housing/Transportation Living arrangements for the past 2 months:  Single Family Home Source of Information:  Adult Children Patient Interpreter Needed:  None Criminal Activity/Legal Involvement Pertinent to Current Situation/Hospitalization:  No - Comment as needed Significant Relationships:  Adult Children Lives with:  Adult Children Do you feel safe going back to the place where you live?  Yes Need for family participation in patient care:  Yes (Comment)  Care giving concerns:  [name] resides in [place] at baseline but has been living with her daughter in [place] recently.   Social Worker assessment / plan:  [name] spoke with Katharine Look and Sandra's sister this afternoon while patient slept. Patient has been living with Katharine Look. [name] introduced self and explained role and purpose of visit. Patient's daughters are in agreement with PT's recommendation for short term rehab. [name] explained the bed search process as patient has never been to rehab prior. Patient's daughters asked appropriate questions.   Daughters did mention patient has a long term care policy in the event she does need to transition from short term rehab to permanent placement.   Bed search initiated.  Employment status:    Insurance information:    PT Recommendations:    Information / Referral to community resources:     Patient/Family's Response to care:  Patient's daughter expressed appreciation for [name] assistance.  Patient/Family's Understanding of and Emotional Response to Diagnosis, Current Treatment, and Prognosis:  Patient's daughter appear to  have a good understanding of their mother's condition and the benefits that rehab may serve.  Emotional Assessment Appearance:  Appears stated age Attitude/Demeanor/Rapport:  (sleeping soundly at time of assessment) Affect (typically observed):  (sleeping soundly) Orientation:  Oriented to Self, Oriented to Situation, Oriented to  Time, Oriented to Place Alcohol / Substance use:  Not Applicable Psych involvement (Current and /or in the community):  No (Comment)  Discharge Needs  Concerns to be addressed:  Care Coordination Readmission within the last 30 days:  Yes Current discharge risk:  None Barriers to Discharge:  Continued Medical Work up   [employer], LCSW 12/07/2018, 1:52 PM

## 2018-12-07 DEATH — deceased

## 2018-12-08 ENCOUNTER — Inpatient Hospital Stay: Payer: Medicare Other

## 2018-12-08 LAB — PROCALCITONIN: Procalcitonin: <0.1 ng/mL

## 2018-12-08 LAB — CBC
HCT: 37.4 % (ref 36.0–46.0)
Hemoglobin: 13.1 g/dL (ref 12.0–15.0)
MCH: 33.1 pg (ref 26.0–34.0)
MCHC: 35 g/dL (ref 30.0–36.0)
MCV: 94.4 fL (ref 80.0–100.0)
Platelets: 241 10*3/uL (ref 150–400)
RBC: 3.96 MIL/uL (ref 3.87–5.11)
RDW: 12.8 % (ref 11.5–15.5)
WBC: 19.3 10*3/uL — ABNORMAL HIGH (ref 4.0–10.5)
nRBC: 0 % (ref 0.0–0.2)

## 2018-12-08 LAB — BASIC METABOLIC PANEL
Anion gap: 7 (ref 5–15)
BUN: 28 mg/dL — ABNORMAL HIGH (ref 8–23)
CO2: 25 mmol/L (ref 22–32)
CREATININE: 0.46 mg/dL (ref 0.44–1.00)
Calcium: 8.7 mg/dL — ABNORMAL LOW (ref 8.9–10.3)
Chloride: 95 mmol/L — ABNORMAL LOW (ref 98–111)
GFR calc Af Amer: >60 mL/min (ref >60)
GFR calc non Af Amer: >60 mL/min (ref >60)
Glucose, Bld: 150 mg/dL — ABNORMAL HIGH (ref 70–99)
Potassium: 4.3 mmol/L (ref 3.5–5.1)
Sodium: 127 mmol/L — ABNORMAL LOW (ref 135–145)

## 2018-12-08 LAB — PROTIME-INR
INR: 2.3 — AB (ref 0.8–1.2)
Prothrombin Time: 24.8 seconds — ABNORMAL HIGH (ref 11.4–15.2)

## 2018-12-08 LAB — SODIUM
Sodium: 125 mmol/L — ABNORMAL LOW (ref 135–145)
Sodium: 126 mmol/L — ABNORMAL LOW (ref 135–145)

## 2018-12-08 MED ORDER — FUROSEMIDE 10 MG/ML IJ SOLN
40.0000 mg | Freq: Once | INTRAMUSCULAR | Status: AC
  Administered 2018-12-08: 40 mg via INTRAVENOUS
  Filled 2018-12-08: qty 4

## 2018-12-08 MED ORDER — WARFARIN SODIUM 2.5 MG PO TABS
2.5000 mg | ORAL_TABLET | Freq: Once | ORAL | Status: AC
  Administered 2018-12-08: 2.5 mg via ORAL
  Filled 2018-12-08: qty 1

## 2018-12-08 NOTE — Evaluation (Signed)
Occupational Therapy Evaluation Patient Details Name: [name] MRN: [id number] DOB: 1930/06/23 Today's Date: 12/08/2018    History of Present Illness 85 yo female with onset of pneumonitis and low Na+ was admitted with recent diagnosis of thoracic compression fractures.  Has spinal brace (TLSO) and has been home with daughter to care for her.  Daughter noted SOB and decreased activity tolerance and brought pt to hospital.  PMHx: asthma, PE, pulm HTN, COPD, carotid artery disease, HTN, depression, home O2, osteoporosis, bronchiectasis, HLD, colon CA, stroke,    Clinical Impression   Pt seen for OT evaluation this date. Pt was generally independent with AD for most ADLs using a RW for functional mobility and occasionally receiving assistance from her daughter for bathing.  Pt currently lives alone in a single story home with a level entrance.  Pt endorses being on 2 liters of O2 at home during the night and was on 2L O2 t/o OT evaluation. Pt reports becoming easily fatigued or out of breath with minimal exertion. Pt currently requires moderate to max assistance for LB dressing, LB bathing, and toileting due to poor activity tolerance and limited spinal ROM from thoracic compression fractures. Pt was able to ambulate ~10 feet within room using RW with CGA (see below for additional details). Pt educated in energy conservation conservation strategies including pursed lip breathing, activity pacing, home/routines modifications, work simplification, AE/DME, prioritizing of meaningful occupations, and falls prevention. Handout provided. Pt verbalized understanding but would benefit from additional skilled OT services to maximize recall and carryover of learned techniques and facilitate implementation of learned techniques into daily routines. Upon discharge, recommend STR.       Follow Up Recommendations  SNF;Supervision/Assistance - 24 hour    Equipment Recommendations  (TBD at next venue of care. )     Recommendations for Other Services       Precautions / Restrictions Precautions Precautions: Fall(telemetry) Required Braces or Orthoses: Spinal Brace Spinal Brace: Thoracolumbosacral orthotic;Applied in sitting position Restrictions Weight Bearing Restrictions: No      Mobility Bed Mobility                  Transfers Overall transfer level: Needs assistance Equipment used: Rolling walker (2 wheeled) Transfers: Sit to/from Stand Sit to Stand: Min guard         General transfer comment: Pt req CGA for ambulation in room. Able to complete sit to stand with CGA and VCs for sequencing/hand placement. Pt required increased time to complete transfers and quickly became OOB. Pt wearing TLSO with all activity during session.     Balance Overall balance assessment: Needs assistance Sitting-balance support: Feet supported;Single extremity supported Sitting balance-Leahy Scale: Fair Sitting balance - Comments: Pt able to maintain seated balance while on BSC. Could not tolerate challenge.    Standing balance support: Bilateral upper extremity supported;During functional activity Standing balance-Leahy Scale: Poor Standing balance comment: Heavy UE use on RW with ambulation.                            ADL either performed or assessed with clinical judgement   ADL Overall ADL's : Needs assistance/impaired Eating/Feeding: Set up;Supervision/ safety;Sitting   Grooming: Set up;Sitting;Supervision/safety   Upper Body Bathing: Set up;Moderate assistance;Sitting   Lower Body Bathing: Set up;Maximal assistance;Sit to/from stand;With adaptive equipment   Upper Body Dressing : Set up;Minimal assistance;Sitting   Lower Body Dressing: Set up;Maximal assistance;Sit to/from stand   Toilet Transfer: Set  up;Ambulation;RW;Min guard;BSC   Toileting- Clothing Manipulation and Hygiene: Set up;Maximal assistance;Sit to/from stand   Tub/ Shower Transfer: Set  up;Ambulation;Rolling walker;Shower seat   Functional mobility during ADLs: Min guard;Rolling walker;Cueing for sequencing;Cueing for safety General ADL Comments: Pt up on BSC at start of evaluation. Required Max A for LB cleaning and dressing on this date. Was able to complete sit<>stand transfers for LB dressing and ambulate from [facility] around bed ~10' to room recliner. Was very OOB with O2 sats at 81 after activity. O2 sats returned to mid 90's with rest and prompts for PLB.      Vision Baseline Vision/History: Wears glasses Patient Visual Report: No change from baseline       Perception     Praxis      Pertinent Vitals/Pain Pain Assessment: No/denies pain     Hand Dominance Right   Extremity/Trunk Assessment Upper Extremity Assessment Upper Extremity Assessment: Generalized weakness   Lower Extremity Assessment Lower Extremity Assessment: Generalized weakness;Defer to PT evaluation   Cervical / Trunk Assessment Cervical / Trunk Assessment: Kyphotic   Communication Communication Communication: HOH   Cognition Arousal/Alertness: Awake/alert Behavior During Therapy: WFL for tasks assessed/performed Overall Cognitive Status: Within Functional Limits for tasks assessed                                     General Comments       Exercises Other Exercises Other Exercises: Instructed pt and caregiver (daughter) in ECS including routines: modifications, positioning, PLB, and safe use of AE in order to decrease energy demands of daily routines and improve functional independence and safety upon hospital DC.    Shoulder Instructions      Home Living Family/patient expects to be discharged to:: Private residence Living Arrangements: Alone Available Help at Discharge: Family;Available 24 hours/day Type of Home: House Home Access: Level entry     Home Layout: One level     Bathroom Shower/Tub: Occupational psychologist: Standard     Home  Equipment: Cane - quad;Toilet riser;Shower seat;Hand held shower head          Prior Functioning/Environment Level of Independence: Independent with assistive device(s)        Comments: used O2 at night previously, daugther is a caregiver but also helps with shopping and housework.         OT Problem List: Decreased strength;Decreased range of motion;Decreased activity tolerance;Decreased coordination;Decreased knowledge of use of DME or AE;Impaired balance (sitting and/or standing);Decreased knowledge of precautions;Cardiopulmonary status limiting activity;Impaired UE functional use;Decreased safety awareness      OT Treatment/Interventions: Self-care/ADL training;Therapeutic exercise;Patient/family education;Energy conservation;Therapeutic activities;DME and/or AE instruction    OT Goals(Current goals can be found in the care plan section) Acute Rehab OT Goals Patient Stated Goal: to walk and get stronger OT Goal Formulation: With patient Time For Goal Achievement: 12/22/18 Potential to Achieve Goals: Good ADL Goals Pt Will Perform Grooming: with modified independence;sitting(With LRAD for safety and improved functional independence.) Pt Will Perform Upper Body Dressing: sitting;with modified independence(With LRAD for safety and improved functional independence.) Pt Will Perform Lower Body Dressing: sit to/from stand;with adaptive equipment;with modified independence(With LRAD for safety and improved functional independence.) Pt Will Transfer to Toilet: bedside commode;with supervision;ambulating(With LRAD/DME for safety and improved functional independence.) Additional ADL Goal #1: Pt will independently verbalize a plan to implement at least 3 learned energy conservation strategies within her daily routines in order  to maximize independence and improve safety upon hospital DC.  OT Frequency: Min 1X/week   Barriers to D/C: Decreased caregiver support  Pt lives alone, but has dtr  with her currently providing care.        Co-evaluation              AM-PAC OT "6 Clicks" Daily Activity     Outcome Measure Help from another person eating meals?: None Help from another person taking care of personal grooming?: None Help from another person toileting, which includes using toliet, bedpan, or urinal?: A Lot Help from another person bathing (including washing, rinsing, drying)?: A Lot Help from another person to put on and taking off regular upper body clothing?: A Little Help from another person to put on and taking off regular lower body clothing?: A Lot 6 Click Score: 17   End of Session Equipment Utilized During Treatment: Gait belt;Rolling walker;Oxygen(TLSO)  Activity Tolerance: Patient tolerated treatment well Patient left: in chair;with call bell/phone within reach;with chair alarm set;with family/visitor present(With TLSO in place. )  OT Visit Diagnosis: Muscle weakness (generalized) (M62.81);Other abnormalities of gait and mobility (R26.89)                Time: 8921-1941 OT Time Calculation (min): 31 min Charges:  OT General Charges $OT Visit: 1 Visit OT Evaluation $OT Eval Moderate Complexity: 1 Mod OT Treatments $Self Care/Home Management : 8-22 mins  Shara Blazing, M.S., OTR/L Ascom: [phone] 12/08/18, 12:03 PM

## 2018-12-08 NOTE — Progress Notes (Signed)
Clinical Social Worker ([name]) contacted patient's daughter Katharine Look and presented SNF bed offers and discussed the quality measures of the facilities. Per Katharine Look she lives in [place] and would like an [facility]. Per Katharine Look she will go tour [facility] and [facility] this afternoon and get back with [name] on her choice.   [employer], LCSW [phone]

## 2018-12-08 NOTE — Clinical Social Work Placement (Signed)
   CLINICAL SOCIAL WORK PLACEMENT  NOTE  Date:  12/08/2018  Patient Details  Name: [name] MRN: [id number] Date of Birth: 1930-01-09  Clinical Social Work is seeking post-discharge placement for this patient at the [place] level of care (*[name] will initial, date and re-position this form in  chart as items are completed):  Yes   Patient/family provided with [place] Work Department's list of facilities offering this level of care within the geographic area requested by the patient (or if unable, by the patient's family).  Yes   Patient/family informed of their freedom to choose among providers that offer the needed level of care, that participate in Medicare, Medicaid or managed care program needed by the patient, have an available bed and are willing to accept the patient.  Yes   Patient/family informed of [facility]'s ownership interest in [facility] and [facility], as well as of the fact that they are under no obligation to receive care at these facilities.  PASRR submitted to EDS on 12/07/18     PASRR number received on 12/07/18     Existing PASRR number confirmed on       FL2 transmitted to all facilities in geographic area requested by pt/family on 12/07/18     FL2 transmitted to all facilities within larger geographic area on       Patient informed that his/her managed care company has contracts with or will negotiate with certain facilities, including the following:        Yes   Patient/family informed of bed offers received.  Patient chooses bed at ([facility] )     Physician recommends and patient chooses bed at      Patient to be transferred to   on  .  Patient to be transferred to facility by       Patient family notified on   of transfer.  Name of family member notified:        PHYSICIAN       Additional Comment:    _______________________________________________ Dannika Hilgeman, Veronia Beets, LCSW 12/08/2018, 2:24  PM

## 2018-12-08 NOTE — Progress Notes (Signed)
Clinical Education officer, museum ([name]) received a call from patient's daughter Katharine Look stating that she toured [facility] today and she chose [facility]. [facility] liaison is aware of accepted bed offer.   [employer], LCSW [phone]

## 2018-12-08 NOTE — Progress Notes (Signed)
[facility]  ROUNDING NOTE   Subjective:   Furosemide IV given yesterday.   Na 125  Daughter at bedside.   Objective:  Vital signs in last 24 hours:  Temp:  [97.5 F (36.4 C)-98.4 F (36.9 C)] 97.5 F (36.4 C) (03/02 0746) Pulse Rate:  [74-120] 80 (03/02 0746) Resp:  [18] 18 (03/02 0503) BP: (124-148)/(47-75) 124/47 (03/02 0746) SpO2:  [91 %-93 %] 92 % (03/02 0746)  Weight change:  Filed Weights   11/18/2018 1038 11/14/2018 2252 12/06/18 0408  Weight: 58.7 kg 57.3 kg 59 kg    Intake/Output: I/O last 3 completed shifts: In: 2231.4 [P.O.:480; I.V.:1.4; IV Piggyback:1750] Out: 1900 [JJOAC:1660]   Intake/Output this shift:  No intake/output data recorded.  Physical Exam: General: NAD,   Head: Normocephalic, atraumatic. Moist oral mucosal membranes  Eyes: Anicteric, PERRL  Neck: Supple, trachea midline  Lungs:  Bilateral crackles  Heart: Regular rate and rhythm  Abdomen:  Soft, nontender,   Extremities:  + peripheral edema.  Neurologic: Nonfocal, moving all four extremities  Skin: No lesions        Basic Metabolic Panel: Recent Labs  Lab 12/05/18 0413 12/05/18 1604  12/06/18 0404  12/07/18 0434 12/07/18 1056 12/07/18 1534 12/07/18 2227 12/08/18 0446 12/08/18 1029  NA 114* 115*   < > 118*   < > 126* 124* 123* 126* 127* 125*  K 3.3* 3.5  --  4.3  --  4.5  --   --   --  4.3  --   CL 80* 82*  --  89*  --  98  --   --   --  95*  --   CO2 22 22  --  23  --  22  --   --   --  25  --   GLUCOSE 155* 183*  --  162*  --  160*  --   --   --  150*  --   BUN 14 16  --  18  --  22  --   --   --  28*  --   CREATININE 0.30* 0.49  --  0.49  --  0.46  --   --   --  0.46  --   CALCIUM 8.4* 8.3*  --  8.4*  --  8.4*  --   --   --  8.7*  --   MG  --   --   --  1.7  --   --   --   --   --   --   --   PHOS  --   --   --  1.9*  --   --   --   --   --   --   --    < > = values in this interval not displayed.    Liver Function Tests: Recent Labs  Lab  11/18/2018 1131  AST 32  ALT 25  ALKPHOS 99  BILITOT 0.4  PROT 7.5  ALBUMIN 3.7   No results for input(s): LIPASE, AMYLASE in the last 168 hours. No results for input(s): AMMONIA in the last 168 hours.  CBC: Recent Labs  Lab 12/05/2018 1131 12/05/18 0413 12/06/18 0404 12/07/18 0434 12/08/18 0446  WBC 10.0 5.1 15.3* 18.0* 19.3*  NEUTROABS 8.5*  --   --   --   --   HGB 14.4 12.3 13.1 12.8 13.1  HCT 40.3 34.3* 37.8 34.5* 37.4  MCV 87.2 88.2 91.5 94.5  94.4  PLT 228 199 214 221 241    Cardiac Enzymes: No results for input(s): CKTOTAL, CKMB, CKMBINDEX, TROPONINI in the last 168 hours.  BNP: Invalid input(s): POCBNP  CBG: No results for input(s): GLUCAP in the last 168 hours.  Microbiology: Results for orders placed or performed during the hospital encounter of 12/03/2018  Culture, blood (Routine x 2)     Status: None (Preliminary result)   Collection Time: 11/25/2018 11:31 AM  Result Value Ref Range Status   Specimen Description BLOOD BLOOD RIGHT ARM  Final   Special Requests   Final    BOTTLES DRAWN AEROBIC AND ANAEROBIC Blood Culture adequate volume   Culture   Final    NO GROWTH 4 DAYS Performed at [facility], [address], [place], [place] [address]    Report Status PENDING  Incomplete  Culture, blood (Routine x 2)     Status: None (Preliminary result)   Collection Time: 11/10/2018 11:31 AM  Result Value Ref Range Status   Specimen Description BLOOD LEFT ANTECUBITAL  Final   Special Requests   Final    BOTTLES DRAWN AEROBIC AND ANAEROBIC Blood Culture results may not be optimal due to an excessive volume of blood received in culture bottles   Culture   Final    NO GROWTH 4 DAYS Performed at [facility], [address], [place], [place] [address]    Report Status PENDING  Incomplete  Culture, sputum-assessment     Status: None   Collection Time: 11/22/2018  5:23 PM  Result Value Ref Range Status   Specimen Description SPUTUM  Final   Special  Requests NONE  Final   Sputum evaluation   Final    THIS SPECIMEN IS ACCEPTABLE FOR SPUTUM CULTURE Performed at [facility], [address], [place], [place] [address]    Report Status 12/06/2018 FINAL  Final  Culture, respiratory     Status: None (Preliminary result)   Collection Time: 11/22/2018  5:23 PM  Result Value Ref Range Status   Specimen Description   Final    SPUTUM Performed at [facility], [address], [place], [place] [address]    Special Requests   Final    NONE Reflexed from [id number] Performed at [facility], [address] [place], [place] [address]    Gram Stain   Final    FEW WBC PRESENT, PREDOMINANTLY PMN FEW GRAM NEGATIVE RODS FEW YEAST FEW GRAM POSITIVE COCCI IN CLUSTERS    Culture   Final    CULTURE REINCUBATED FOR BETTER GROWTH Performed at [facility], [place] [address], [place], [place] [address]    Report Status PENDING  Incomplete    Coagulation Studies: Recent Labs    12/06/18 0404 12/07/18 0434 12/08/18 0446  LABPROT 33.5* 32.5* 24.8*  INR 3.4* 3.2* 2.3*    Urinalysis: No results for input(s): COLORURINE, LABSPEC, PHURINE, GLUCOSEU, HGBUR, BILIRUBINUR, KETONESUR, PROTEINUR, UROBILINOGEN, NITRITE, LEUKOCYTESUR in the last 72 hours.  Invalid input(s): APPERANCEUR    Imaging: Dg Chest 2 View  Result Date: 12/08/2018 CLINICAL DATA:  85 year old female with shortness of breath. EXAM: CHEST - 2 VIEW COMPARISON:  12/07/2018 and earlier. FINDINGS: Seated AP and lateral views of the chest. New small bilateral pleural effusions. Increasing lung base opacity on the lateral view appears primarily in the right lung on the AP view. And there is chronic right middle lobe consolidation. Stable cardiac size and mediastinal contours. Calcified aortic atherosclerosis. Visualized tracheal air  column is within normal limits. No pneumothorax. Stable pulmonary vascularity without evidence of acute pulmonary edema. Osteopenia  with widespread vertebral compression. No acute osseous abnormality identified. Paucity of bowel gas in the upper abdomen. IMPRESSION: 1. Confluent right lung base opacity suspicious for pneumonia and/or aspiration. Small new pleural effusions since 11/26/2018. 2. Chronic right middle lobe disease. 3. Osteopenia and vertebral compression fractures. Electronically Signed   By: Genevie Ann M.D.   On: 12/08/2018 07:34   Dg Chest Port 1 View  Result Date: 12/07/2018 CLINICAL DATA:  Acute shortness of breath EXAM: PORTABLE CHEST 1 VIEW COMPARISON:  11/28/2018 chest radiograph and prior studies FINDINGS: New RIGHT LOWER lung consolidation versus atelectasis noted. RIGHT perihilar opacity is unchanged. Cardiomediastinal silhouette is otherwise unremarkable. A trace RIGHT pleural effusion is present. No pneumothorax or acute bony abnormality. IMPRESSION: New RIGHT LOWER lung consolidation versus atelectasis and trace RIGHT pleural effusion. No other significant changes. Electronically Signed   By: Margarette Canada M.D.   On: 12/07/2018 13:36     Medications:   . sodium chloride Stopped (12/08/18 0124)  . doxycycline (VIBRAMYCIN) IV 100 mg (12/08/18 1021)   . acidophilus   Oral Daily  . amLODipine  5 mg Oral Daily  . budesonide (PULMICORT) nebulizer solution  0.25 mg Nebulization BID  . diltiazem  60 mg Oral Q8H  . fluticasone  2 spray Each Nare Daily  . guaiFENesin  600 mg Oral BID  . ipratropium-albuterol  3 mL Nebulization Q6H  . losartan  100 mg Oral Daily  . methylPREDNISolone (SOLU-MEDROL) injection  60 mg Intravenous Q6H  . multivitamin with minerals  1 tablet Oral Daily  . sodium chloride flush  10 mL Intravenous Q12H  . warfarin  2.5 mg Oral ONCE-1800  . Warfarin - Pharmacist Dosing Inpatient   Does not apply q1800   sodium chloride, acetaminophen **OR** acetaminophen, cyclobenzaprine, docusate sodium, hydrALAZINE, loratadine, ondansetron **OR** ondansetron (ZOFRAN) IV, promethazine  Assessment/  Plan:  [name] is a 85 y.o. white female  with CVA, PE, hypertension, hyperlipidemia, GERD, coronary artery disease, COPD, depression, who was admitted to [facility] on 11/18/2018 for Bronchiectasis with acute exacerbation and Hyponatremia   1. Hyponatremia: acute on chronic. Baseline of 131 on 09/01/18 Acute hyponatremia secondary to hypovolemia and pulmonary issues. - Discontinued normal saline infusion - Hold sertraline - IV furosemide  2. Hypertension: urgency on admission.  - losartan, hydralazine, amlodipine, diltiazem.   3. Acute respiratory failure: secondary to acute bronchiectasis - Order CXR portable. May need diuretic due to volume expansion - doxycycline - solumedrol.  - O2.   4. History of pulmonary embolism - warfarin  5. Depression: patient was not taking sertraline at home. Continue to hold due to hyponatremia.   6. Hypokalemia - PO potassium chloride.    LOS: 4 [name] 3/2/20201:33 PM

## 2018-12-08 NOTE — Progress Notes (Signed)
[facility] [place] NAME: [name]    MR#:  [id number]  DATE OF BIRTH:  [birth year]  SUBJECTIVE:  CHIEF COMPLAINT:   Chief Complaint  Patient presents with  . Shortness of Breath  . Cough   Confusion improved. Breathing more labored yesterday and this morning. Pain in left anterior leg stable and unchanged.  Seen getting up with the walker to commode with NT. On 2 L O2. Daughter in the room updated.   REVIEW OF SYSTEMS:  Review of Systems  Constitutional: Negative forchillsand fever.Positive for malaise/fatigue. HENT: Negative forhearing lossand tinnitus.  Eyes: Negative forblurred visionand double vision.  Respiratory: Positive forcoughand shortness of breath.  Cardiovascular: Negative forchest painand palpitations.  Gastrointestinal: Negative forheartburn,nauseaand vomiting.  Genitourinary: Negative fordysuriaand urgency.  Musculoskeletal:Positiveformyalgiaat left anterior lower extremity(may have hit leg with walker at home). Only with pressure. Improving. Skin: Negative foritchingand rash.Positive for bruising (on warfarin) Neurological: Negative fordizziness,seizuresand headaches.  Psychiatric/Behavioral: Negative fordepressionand hallucinations. Confusion improved. DRUG ALLERGIES:   Allergies  Allergen Reactions  . Raloxifene Other (See Comments)    REACTION: risk of recurrent stroke  . Sulfonamide Derivatives Anaphylaxis    REACTION: tongue swells  . Amoxicillin-Pot Clavulanate Diarrhea    REACTION: diarrhea Has patient had a PCN reaction causing immediate rash, facial/tongue/throat swelling, SOB or lightheadedness with hypotension: unknown Has patient had a PCN reaction causing severe rash involving mucus membranes or skin necrosis: unknown Has patient had a PCN reaction that required hospitalization : unknown Has patient had a PCN reaction occurring within the last 10 years:  yes   pt states she should be able to take penicillin, but cant remember actually taking it   . Ciclesonide Other (See Comments)    REACTION: bad smell  . Ciprofloxacin Other (See Comments)    dizziness   VITALS:  Blood pressure 132/64, pulse 86, temperature (!) 97.5 F (36.4 C), temperature source Oral, resp. rate 18, height 5\' 4"  (1.626 m), weight 59 kg, SpO2 92 %. PHYSICAL EXAMINATION:  Physical Exam Constitutional: She isoriented to person, place, and time. She appearswell-developedand well-nourished.  HENT:  Head:Normocephalicand atraumatic.  Right Ear: External earnormal.  Mouth/Throat:Oropharynx is clear and moist.  Eyes:Pupils are equal, round, and reactive to light. Conjunctivaeand EOMare normal. Right eye exhibits no discharge. Neck:Normal range of motion.Neck supple.No tracheal deviationpresent.  Cardiovascular:Normal rate,regular rhythmand normal heart sounds. Respiratory:There are diffuse crackles present. Effort normal at rest. Norespiratory distress.  FG:HWEX.Bowel sounds are normal. She exhibitsno distension. There isno abdominal tenderness.  Musculoskeletal:Tender to palpation at left anterior lower extremity.Normal range of motion.  General: No edema.  Neurological: She isalertand oriented to person, place, and time. Nocranial nerve deficit.  Skin:Scattered ecchymoses.Skin iswarm. She isnot diaphoretic. Noerythema.  Psychiatric: She has anormal mood and affect. LABORATORY PANEL:  Female CBC Recent Labs  Lab 12/08/18 0446  WBC 19.3*  HGB 13.1  HCT 37.4  PLT 241   ------------------------------------------------------------------------------------------------------------------ Chemistries  Recent Labs  Lab 11/11/2018 1131  12/06/18 0404  12/08/18 0446 12/08/18 1029  NA 112*   < > 118*   < > 127* 125*  K 4.1   < > 4.3   < > 4.3  --   CL 77*   < > 89*   < > 95*  --   CO2 24   < > 23   < > 25  --   GLUCOSE 145*   <  > 162*   < > 150*  --  BUN 13   < > 18   < > 28*  --   CREATININE 0.36*   < > 0.49   < > 0.46  --   CALCIUM 8.7*   < > 8.4*   < > 8.7*  --   MG  --   --  1.7  --   --   --   AST 32  --   --   --   --   --   ALT 25  --   --   --   --   --   ALKPHOS 99  --   --   --   --   --   BILITOT 0.4  --   --   --   --   --    < > = values in this interval not displayed.   RADIOLOGY:  Dg Chest 2 View  Result Date: 12/08/2018 CLINICAL DATA:  85 year old female with shortness of breath. EXAM: CHEST - 2 VIEW COMPARISON:  12/07/2018 and earlier. FINDINGS: Seated AP and lateral views of the chest. New small bilateral pleural effusions. Increasing lung base opacity on the lateral view appears primarily in the right lung on the AP view. And there is chronic right middle lobe consolidation. Stable cardiac size and mediastinal contours. Calcified aortic atherosclerosis. Visualized tracheal air column is within normal limits. No pneumothorax. Stable pulmonary vascularity without evidence of acute pulmonary edema. Osteopenia with widespread vertebral compression. No acute osseous abnormality identified. Paucity of bowel gas in the upper abdomen. IMPRESSION: 1. Confluent right lung base opacity suspicious for pneumonia and/or aspiration. Small new pleural effusions since 11/11/2018. 2. Chronic right middle lobe disease. 3. Osteopenia and vertebral compression fractures. Electronically Signed   By: Genevie Ann M.D.   On: 12/08/2018 07:34   ASSESSMENT AND PLAN:   [name] a 70 year oldfemalewithhistory ofCVA, PE, hypertension, hyperlipidemia, GERD, coronary artery disease, COPD, depression, who was admitted to [facility] on2/27/2020for Bronchiectasis with acute exacerbation, hyponatremia, and generalized fatigue.  Plan:  1.Acute respiratory failureacute bronchiectasis with iatrogenic fluid overload. Question acute pneumonia vs. Aspiration.  Continue nebulizer treatments. Antibiotics.Solumedrol. - CXR  portable 12/07/2018 repeat - shows new consolidation vs. Atelectasis, right pleural effusion - CXR 2-view 12/08/2018 - confluent right lung base opacity - Nephrology gave one dose IV Lasix 40 mg today WBC elevated to 19.3 today. Patient is on steroids. Repeat CBC tomorrow AM.  She remains on doxycycline.  Sputum culture pending.  Procalcitonin less than 0.1.  Assess tomorrow for improvement in breathing after diuresis.   2.Severe hyponatremiawith initial sodium of 112 Likely due tounderlying pulmonary process No seizures. Had some mild confusion which is reported to be improved. Most recent sodium 125.  Has not been stable. Goal for 130 or above. Daily BMP monitoring.  - Nephrology/Dr. Juleen China following: discontinued hypertonic saline, serial sodium and neuro checks. Hold sertraline and diuretics. Discontinued normal saline.  PO intake. Serum osmolality of 245. Urine sodium of 21. Urine osmolality of 277.  [id number] cortisol level 12.0  3. Hypertensive urgencyon presentation  Blood pressure appears better controlled this morning on current regimen. Continue amlodipine, diltiazem, hydralazine, losartan  4.Sinus tachycardia due to underlying lung disease;resolved  5.Depressionhold sertraline for hyponatremia  6.History of PEcontinue Coumadin INR 2.3 today.Goal 2-3. Pharmacy following. Warfarin to be resumed tonight.   7. Deconditioning seen by OT and PT.  Recommending SNF.  [facility] LCSW following.  DVT prophylaxis;patient already on Coumadin. See above.   All the records  are reviewed and case is discussed with Care Management/Social Worker. Management plans discussed with the patient and/or family and they are in agreement.  CODE STATUS: Full Code  TOTAL TIME TAKING CARE OF THIS PATIENT: 30 minutes.   More than 50% of the time was spent in counseling/coordination of care: YES  POSSIBLE D/C IN 1-2 DAYS, DEPENDING ON CLINICAL CONDITION.  Londyn Wotton  PA-C on 12/08/2018 at 3:20 PM  Between 7am to 6pm - Pager - [phone]  After 6 pm go to [email or web address] - Proofreader  [facility]  Office  [phone]  CC: Primary care physician; [name], MD  Note: This dictation was prepared with Dragon dictation along with smaller phrase technology. Any transcriptional errors that result from this process are unintentional.

## 2018-12-08 NOTE — Care Management Important Message (Signed)
Copy of signed Medicare IM left with patient in room. 

## 2018-12-08 NOTE — Consult Note (Signed)
ANTICOAGULATION CONSULT NOTE - Follow up [facility] for Warfarin Dosing Indication: pulmonary embolus  Allergies  Allergen Reactions  . Raloxifene Other (See Comments)    REACTION: risk of recurrent stroke  . Sulfonamide Derivatives Anaphylaxis    REACTION: tongue swells  . Amoxicillin-Pot Clavulanate Diarrhea    REACTION: diarrhea Has patient had a PCN reaction causing immediate rash, facial/tongue/throat swelling, SOB or lightheadedness with hypotension: unknown Has patient had a PCN reaction causing severe rash involving mucus membranes or skin necrosis: unknown Has patient had a PCN reaction that required hospitalization : unknown Has patient had a PCN reaction occurring within the last 10 years: yes   pt states she should be able to take penicillin, but cant remember actually taking it   . Ciclesonide Other (See Comments)    REACTION: bad smell  . Ciprofloxacin Other (See Comments)    dizziness   Patient Measurements: Height: 5\' 4"  (162.6 cm) Weight: (pt. complained back pain, could mot lie flat for weight) IBW/kg (Calculated) : 54.7  Vital Signs: Temp: 97.5 F (36.4 C) (03/02 0746) Temp Source: Oral (03/02 0746) BP: 124/47 (03/02 0746) Pulse Rate: 80 (03/02 0746)  Labs: Recent Labs    12/06/18 0404 12/07/18 0434 12/08/18 0446  HGB 13.1 12.8 13.1  HCT 37.8 34.5* 37.4  PLT 214 221 241  LABPROT 33.5* 32.5* 24.8*  INR 3.4* 3.2* 2.3*  CREATININE 0.49 0.46 0.46    Estimated Creatinine Clearance: 43.6 mL/min (by C-G formula based on SCr of 0.46 mg/dL).   Assessment: Pharmacy consulted for warfarin dosing and monitor in 85 yo female with PMH of PE. Patient is being admitted with acute respiratory failure secondary to acute bronchiectasis and is receiving Doxycycline BID. INR on admission if supratherapeutic  @ 3.5. Last dose reported to be 2/26 PM.    Home Regimen: Warfarin 2.5mg  Tues, Weds, Thurs, Sat, Sun                             Warfarin 5mg   Mon, Fri   DATE INR DOSE 2/27 3.5  held 2/28 3.7 held 2/29  3.4 held 3/01 3.2 Held 3/02     2.3  Goal of Therapy:  INR 2-3 Monitor platelets by anticoagulation protocol: Yes   Plan:  Will resume Warfarin tonight, will order Warfarin 2.5mg  times one.   Will recheck INR daily while on antibiotics per protocol, and CBCs at least every 3 days.   Pharmacy will F/U with AM labs and continue to order warfarin dose based on INRs.   Paulina Fusi, PharmD, BCPS 12/08/2018 10:22 AM

## 2018-12-09 DIAGNOSIS — Z515 Encounter for palliative care: Secondary | ICD-10-CM

## 2018-12-09 DIAGNOSIS — J471 Bronchiectasis with (acute) exacerbation: Principal | ICD-10-CM

## 2018-12-09 DIAGNOSIS — Z7189 Other specified counseling: Secondary | ICD-10-CM

## 2018-12-09 DIAGNOSIS — R0600 Dyspnea, unspecified: Secondary | ICD-10-CM

## 2018-12-09 DIAGNOSIS — E871 Hypo-osmolality and hyponatremia: Secondary | ICD-10-CM

## 2018-12-09 LAB — BASIC METABOLIC PANEL
Anion gap: 7 (ref 5–15)
BUN: 33 mg/dL — ABNORMAL HIGH (ref 8–23)
CO2: 26 mmol/L (ref 22–32)
Calcium: 9 mg/dL (ref 8.9–10.3)
Chloride: 93 mmol/L — ABNORMAL LOW (ref 98–111)
Creatinine, Ser: 0.48 mg/dL (ref 0.44–1.00)
GFR calc Af Amer: >60 mL/min (ref >60)
GFR calc non Af Amer: >60 mL/min (ref >60)
Glucose, Bld: 177 mg/dL — ABNORMAL HIGH (ref 70–99)
Potassium: 4.4 mmol/L (ref 3.5–5.1)
SODIUM: 126 mmol/L — AB (ref 135–145)

## 2018-12-09 LAB — CBC
HCT: 35.3 % — ABNORMAL LOW (ref 36.0–46.0)
Hemoglobin: 12.2 g/dL (ref 12.0–15.0)
MCH: 31.4 pg (ref 26.0–34.0)
MCHC: 34.6 g/dL (ref 30.0–36.0)
MCV: 90.7 fL (ref 80.0–100.0)
Platelets: 225 10*3/uL (ref 150–400)
RBC: 3.89 MIL/uL (ref 3.87–5.11)
RDW: 12.9 % (ref 11.5–15.5)
WBC: 21.6 10*3/uL — AB (ref 4.0–10.5)
nRBC: 0 % (ref 0.0–0.2)

## 2018-12-09 LAB — PROTIME-INR
INR: 2 — ABNORMAL HIGH (ref 0.8–1.2)
Prothrombin Time: 22.2 seconds — ABNORMAL HIGH (ref 11.4–15.2)

## 2018-12-09 LAB — CULTURE, BLOOD (ROUTINE X 2)
Culture: NO GROWTH
Culture: NO GROWTH
Special Requests: ADEQUATE

## 2018-12-09 LAB — PROCALCITONIN: Procalcitonin: <0.1 ng/mL

## 2018-12-09 LAB — COMPREHENSIVE METABOLIC PANEL
ALK PHOS: 69 U/L (ref 38–126)
ALT: 52 U/L — ABNORMAL HIGH (ref 0–44)
AST: 30 U/L (ref 15–41)
Albumin: 2.3 g/dL — ABNORMAL LOW (ref 3.5–5.0)
Anion gap: 7 (ref 5–15)
BUN: 34 mg/dL — ABNORMAL HIGH (ref 8–23)
CALCIUM: 8.3 mg/dL — AB (ref 8.9–10.3)
CO2: 26 mmol/L (ref 22–32)
Chloride: 93 mmol/L — ABNORMAL LOW (ref 98–111)
Creatinine, Ser: 0.47 mg/dL (ref 0.44–1.00)
GFR calc Af Amer: >60 mL/min (ref >60)
GFR calc non Af Amer: >60 mL/min (ref >60)
Glucose, Bld: 167 mg/dL — ABNORMAL HIGH (ref 70–99)
Potassium: 4.1 mmol/L (ref 3.5–5.1)
Sodium: 126 mmol/L — ABNORMAL LOW (ref 135–145)
TOTAL PROTEIN: 5.3 g/dL — AB (ref 6.5–8.1)
Total Bilirubin: 0.7 mg/dL (ref 0.3–1.2)

## 2018-12-09 LAB — SODIUM
Sodium: 128 mmol/L — ABNORMAL LOW (ref 135–145)
Sodium: 130 mmol/L — ABNORMAL LOW (ref 135–145)

## 2018-12-09 LAB — GLUCOSE, CAPILLARY: Glucose-Capillary: 184 mg/dL — ABNORMAL HIGH (ref 70–99)

## 2018-12-09 MED ORDER — MORPHINE SULFATE (CONCENTRATE) 10 MG/0.5ML PO SOLN
5.0000 mg | Freq: Four times a day (QID) | ORAL | Status: DC | PRN
  Administered 2018-12-09 – 2018-12-10 (×3): 5 mg via ORAL
  Filled 2018-12-09 (×4): qty 1

## 2018-12-09 MED ORDER — WARFARIN SODIUM 2.5 MG PO TABS
2.5000 mg | ORAL_TABLET | Freq: Once | ORAL | Status: AC
  Administered 2018-12-09: 2.5 mg via ORAL
  Filled 2018-12-09: qty 1

## 2018-12-09 MED ORDER — SODIUM CHLORIDE 0.9 % IV SOLN
2.0000 g | INTRAVENOUS | Status: DC
  Administered 2018-12-09 – 2018-12-10 (×2): 2 g via INTRAVENOUS
  Filled 2018-12-09: qty 20
  Filled 2018-12-09 (×2): qty 2

## 2018-12-09 MED ORDER — FUROSEMIDE 10 MG/ML IJ SOLN: 40.0000 mg | Freq: Once | INTRAMUSCULAR | Status: DC

## 2018-12-09 MED ORDER — SENNOSIDES-DOCUSATE SODIUM 8.6-50 MG PO TABS
2.0000 | ORAL_TABLET | Freq: Two times a day (BID) | ORAL | Status: DC
  Administered 2018-12-09 – 2018-12-10 (×4): 2 via ORAL
  Filled 2018-12-09 (×5): qty 2

## 2018-12-09 MED ORDER — METHYLPREDNISOLONE SODIUM SUCC 125 MG IJ SOLR
60.0000 mg | Freq: Three times a day (TID) | INTRAMUSCULAR | Status: DC
  Administered 2018-12-09 – 2018-12-11 (×6): 60 mg via INTRAVENOUS
  Filled 2018-12-09 (×6): qty 2

## 2018-12-09 MED ORDER — TOLVAPTAN 15 MG PO TABS
15.0000 mg | ORAL_TABLET | ORAL | Status: DC
  Administered 2018-12-09: 15 mg via ORAL
  Filled 2018-12-09 (×2): qty 1

## 2018-12-09 MED ORDER — BISACODYL 5 MG PO TBEC: 5.0000 mg | DELAYED_RELEASE_TABLET | Freq: Every day | ORAL | Status: DC | PRN

## 2018-12-09 NOTE — Progress Notes (Signed)
Physical Therapy Treatment Patient Details Name: [name] MRN: [id number] DOB: January 27, 1930 Today's Date: 12/09/2018    History of Present Illness 85 yo female with onset of pneumonitis and low Na+ was admitted with recent diagnosis of thoracic compression fractures.  Has spinal brace (TLSO) and has been home with daughter to care for her.  Daughter noted SOB and decreased activity tolerance and brought pt to hospital.  PMHx: asthma, PE, pulm HTN, COPD, carotid artery disease, HTN, depression, home O2, osteoporosis, bronchiectasis, HLD, colon CA, stroke,     PT Comments    Pt initially somewhat hesitant to work with PT, but actually ended up doing relatively well with good effort.  She needed a lot of rest breaks and cuing for breathing, etc but was able to do most bed mobility w/o heavy assist.  She, however, was very weak and quick to fatigue and despite being able to walk a few feet she fatigued to the point of needing to quickly sit with <10 ft of ambulation on O2.  Her daughter was present and helpful during session, she is (rightfully) concerned about being able to take care of her mother at home.  Pt is profoundly weak in b/l LEs (R is stronger than L).  Again despite some hesitancy she showed great effort, but simply was too weak to really push herself.   Follow Up Recommendations  SNF     Equipment Recommendations  None recommended by PT    Recommendations for Other Services       Precautions / Restrictions Precautions Precautions: Fall Required Braces or Orthoses: Spinal Brace Spinal Brace: Thoracolumbosacral orthotic;Applied in sitting position Restrictions Weight Bearing Restrictions: No    Mobility  Bed Mobility Overal bed mobility: Needs Assistance Bed Mobility: Supine to Sit;Sit to Supine     Supine to sit: Min assist Sit to supine: Min assist   General bed mobility comments: Pt did relatively well with slow but determined effort to get to/from supine, did  need assist with both transtions despite good effort  Transfers Overall transfer level: Needs assistance Equipment used: Rolling walker (2 wheeled)   Sit to Stand: Min assist         General transfer comment: Pt needed light assist to get to standing, and from standard height bed would not have been able to rise w/o assist  Ambulation/Gait Ambulation/Gait assistance: Min assist Gait Distance (Feet): 7 Feet Assistive device: Rolling walker (2 wheeled);1 person hand held assist       General Gait Details: Pt showed great effort with limited bout of ambulation, her O2 jumped from 110s to 130s and O2 dropped from high 80s to low 80s with limited effort.  Pt very faitgued and limited, but did not have LOBs or safety issues related to balance, etc   Stairs             Wheelchair Mobility    Modified Rankin (Stroke Patients Only)       Balance Overall balance assessment: Needs assistance   Sitting balance-Leahy Scale: Good Sitting balance - Comments: Pt able to maintain EOB sitting relatively well despite SOB/fatigue     Standing balance-Leahy Scale: Fair Standing balance comment: Heavy UE use on RW with standing/ambulation.                             Cognition Arousal/Alertness: Awake/alert Behavior During Therapy: WFL for tasks assessed/performed Overall Cognitive Status: Within Functional Limits for tasks assessed  Exercises General Exercises - Lower Extremity Ankle Circles/Pumps: AROM;10 reps Long Arc Quad: AROM;10 reps(very lightly resisted on 5 reps) Heel Slides: AROM;10 reps Hip ABduction/ADduction: AROM;10 reps Hip Flexion/Marching: AROM;10 reps;Strengthening(lightly resisted on 5 reps each side)    General Comments        Pertinent Vitals/Pain Pain Assessment: No/denies pain    Home Living                      Prior Function            PT Goals (current goals can  now be found in the care plan section) Progress towards PT goals: Progressing toward goals    Frequency    Min 2X/week      PT Plan Current plan remains appropriate    Co-evaluation              AM-PAC PT "6 Clicks" Mobility   Outcome Measure  Help needed turning from your back to your side while in a flat bed without using bedrails?: A Little Help needed moving from lying on your back to sitting on the side of a flat bed without using bedrails?: A Little Help needed moving to and from a bed to a chair (including a wheelchair)?: A Little Help needed standing up from a chair using your arms (e.g., wheelchair or bedside chair)?: A Little Help needed to walk in hospital room?: A Lot Help needed climbing 3-5 steps with a railing? : Total 6 Click Score: 15    End of Session Equipment Utilized During Treatment: Gait belt;Oxygen Activity Tolerance: Patient tolerated treatment well;Patient limited by fatigue;Treatment limited secondary to medical complications (Comment) Patient left: in bed;with call bell/phone within reach;with bed alarm set;with family/visitor present   PT Visit Diagnosis: Unsteadiness on feet (R26.81);Muscle weakness (generalized) (M62.81);Difficulty in walking, not elsewhere classified (R26.2)     Time: 8592-9244 PT Time Calculation (min) (ACUTE ONLY): 42 min  Charges:  $Gait Training: 8-22 mins $Therapeutic Exercise: 8-22 mins $Therapeutic Activity: 8-22 mins                     Kreg Shropshire, DPT 12/09/2018, 5:53 PM

## 2018-12-09 NOTE — Progress Notes (Signed)
[facility] [place] NAME: [name]    MR#:  [id number]  DATE OF BIRTH:  04-Jan-1930  SUBJECTIVE:  CHIEF COMPLAINT:   Chief Complaint  Patient presents with  . Shortness of Breath  . Cough   Na 126. Spoke about hospice, DNR/DNI status and overall goals of care with palliative care. Shortness of breath worsened today. Weakness worsened.   REVIEW OF SYSTEMS:  Review of Systems  Constitutional: Negative forchillsand fever.Positive for malaise/fatigue. HENT: Negative forhearing lossand tinnitus.  Eyes: Negative forblurred visionand double vision.  Respiratory: Positive forcoughand shortness of breath.  Cardiovascular: Negative forchest painand palpitations.  Gastrointestinal: Negative forheartburn,nauseaand vomiting.  Genitourinary: Negative fordysuriaand urgency.  Musculoskeletal:Positiveforgeneralized weakness, myalgiaat left anterior lower extremity(may have hit leg with walker at home).Only with pressure.Improving. Skin: Negative foritchingand rash.Positive for bruising (on warfarin) Neurological: Negative fordizziness,seizuresand headaches.  Psychiatric/Behavioral: Negative fordepressionand hallucinations. Confusion improved. DRUG ALLERGIES:   Allergies  Allergen Reactions  . Raloxifene Other (See Comments)    REACTION: risk of recurrent stroke  . Sulfonamide Derivatives Anaphylaxis    REACTION: tongue swells  . Amoxicillin-Pot Clavulanate Diarrhea    REACTION: diarrhea Has patient had a PCN reaction causing immediate rash, facial/tongue/throat swelling, SOB or lightheadedness with hypotension: unknown Has patient had a PCN reaction causing severe rash involving mucus membranes or skin necrosis: unknown Has patient had a PCN reaction that required hospitalization : unknown Has patient had a PCN reaction occurring within the last 10 years: yes   pt states she should be able to take  penicillin, but cant remember actually taking it   . Ciclesonide Other (See Comments)    REACTION: bad smell  . Ciprofloxacin Other (See Comments)    dizziness   VITALS:  Blood pressure (!) 136/59, pulse 92, temperature (!) 97.3 F (36.3 C), temperature source Oral, resp. rate (!) 22, height 5\' 4"  (1.626 m), weight 59 kg, SpO2 91 %. PHYSICAL EXAMINATION:  Physical Exam Constitutional: She isoriented to person, place, and time. She appearswell-developedand well-nourished.  HENT:  Head:Normocephalicand atraumatic.  Right Ear: External earnormal.  Mouth/Throat:Oropharynx is clear and moist.  Eyes:Pupils are equal, round, and reactive to light. Conjunctivaeand EOMare normal. Right eye exhibits no discharge. Neck:Normal range of motion.Neck supple.No tracheal deviationpresent.  Cardiovascular:Normal rate,regular rhythmand normal heart sounds. Positive for edema. Respiratory:There are diffuse crackles present.Effort normalat rest.Norespiratory distress.  DZ:HGDJ.Bowel sounds are normal. She exhibitsno distension. There isno abdominal tenderness.  Musculoskeletal:Tender to palpation at left anterior lower extremity.Normal range of motion.  General: No edema.  Neurological: She isalertand oriented to person, place, and time. Nocranial nerve deficit.  Skin:Scattered ecchymoses.Skin iswarm. She isnot diaphoretic. Noerythema.  Psychiatric: She has anormal mood and affect. LABORATORY PANEL:  Female CBC Recent Labs  Lab 12/09/18 0411  WBC 21.6*  HGB 12.2  HCT 35.3*  PLT 225   ------------------------------------------------------------------------------------------------------------------ Chemistries  Recent Labs  Lab 12/06/18 0404  12/09/18 0411 12/09/18 0903  NA 118*   < > 126* 126*  K 4.3   < > 4.1 4.4  CL 89*   < > 93* 93*  CO2 23   < > 26 26  GLUCOSE 162*   < > 167* 177*  BUN 18   < > 34* 33*  CREATININE 0.49   < > 0.47 0.48    CALCIUM 8.4*   < > 8.3* 9.0  MG 1.7  --   --   --   AST  --   --  30  --  ALT  --   --  52*  --   ALKPHOS  --   --  69  --   BILITOT  --   --  0.7  --    < > = values in this interval not displayed.   RADIOLOGY:  No results found. ASSESSMENT AND PLAN:   Tabitha Tavares Schillois a 40 year oldfemalewithhistory ofCVA, PE, hypertension, hyperlipidemia, GERD, coronary artery disease, COPD, depression, who was admitted to [facility] on2/27/2020for Bronchiectasis with acute exacerbation, hyponatremia, and generalized fatigue.  Plan:  1.Acute respiratory failureacute bronchiectasis with iatrogenic fluid overload. Question acute pneumonia vs. Aspiration.  Continue nebulizer treatments. Antibiotics.Solumedrol. -CXR portable 12/07/2018 repeat - shows new consolidation vs. Atelectasis, right pleural effusion - CXR 2-view 12/08/2018 - confluent right lung base opacity - Nephrology gave one dose IV Lasix 40 mg yesterday. Will defer further diuresis to Dr. Juleen China.  WBC elevated to 21.6 today. Patient is on steroids. Repeat CBC tomorrow AM.  She was on doxycycline. Sputum culture growing GNR.  Procalcitonin less than 0.1. Switched to Rocephin per pharmacy recommendations today.  2.Severe hyponatremiawith initial sodium of 112 Likely due tounderlying pulmonary process No seizures. Had some mild confusion which is reported to be improved. Most recent sodium 126.  Has not been stable.Continue monitoring.  -Nephrology/Dr. Juleen China following:discontinuedhypertonic saline, serial sodium and neuro checks. Hold sertraline and diuretics.Discontinued normal saline. Trial of Tolvaptan.  PO intake. Serum osmolality of 245. Urine sodium of 21. Urine osmolality of 277.  [id number] cortisol level 12.0  3. Hypertensive urgencyon presentation  Blood pressure appears better controlled this morning on current regimen. Continue amlodipine, diltiazem, hydralazine, losartan  4.Sinus  tachycardia due to underlying lung disease;resolved  5.Depressionhold sertralinefor hyponatremia  6.History of PEcontinue Coumadin INR 2.0 today.Goal 2-3.Pharmacy following  7. Deconditioning, multiple comorbidities seen by OT and PT.  Recommending SNF.  [facility] LCSW following. Palliative care following, patient and family considering DNR/DNI, will meet with palliative care again tomorrow morning ~ 10 AM.   DVT prophylaxis;patient already on Coumadin. See above.   All the records are reviewed and case is discussed with Care Management/Social Worker. Management plans discussed with the patient and/or family and they are in agreement.  CODE STATUS: Full Code - meeting with palliative care again tomorrow  TOTAL TIME TAKING CARE OF THIS PATIENT: 30 minutes.   More than 50% of the time was spent in counseling/coordination of care: YES  POSSIBLE D/C IN 1-2 DAYS, DEPENDING ON CLINICAL CONDITION.   Burnis Halling PA-C on 12/09/2018 at 1:58 PM  Between 7am to 6pm - Pager - [phone]  After 6 pm go to [email or web address] - Proofreader  [facility]  Office  [phone]  CC: Primary care physician; [name], MD  Note: This dictation was prepared with Dragon dictation along with smaller phrase technology. Any transcriptional errors that result from this process are unintentional.

## 2018-12-09 NOTE — Progress Notes (Signed)
PMT consult received and chart reviewed. Discussed with attending. Met with patient and daughters Lovey Newcomer and Cecille Rubin) at bedside to discuss [place]. Discussed diagnoses, interventions, guarded prognosis, and multiple underlying co-morbidities. Patient shares appropriate thoughts of how she has fought for so long, how she is tired, how she is a [place], and how she has lived a good life. Introduced MOST form and discussed in detail recommendation for DNR/DNI with underlying age, frailty, co-morbidities, and current clinical status. Patient is not ready to make decisions today ("I need to think about it") and wishes to further discuss with her daughters this evening. Patient/daughters understand she is a poor candidate for rehab with her current clinical condition. Introduced hospice options and philosophy.   Patient agreeable to try low dose Roxanol for dyspnea.   PMT provider f/u with patient and daughters 3/4 ~10am.   Full consult note to follow.   NO CHARGE  Ihor Dow, [place], FNP-C Palliative Medicine Team  Phone: [phone] Fax: [phone]

## 2018-12-09 NOTE — Progress Notes (Signed)
Prayer requested. Kyliah shared that she had explored her health care options today. She is 'tired' and ready to be made comfortable. Daughter was bedside and continued conversations about hospice. Melvie and family are onboard for hospice and increase in morphine to decrease shortness of breath so she can enjoy the time she has life. Zilphia shared "I have had a goodlife." She is excited to see her husband and mother in heaven. Prayer was offered for continued peace for future needs and decisions. RN made aware of patient/families desire for more morphine and spoke to SW regarding inquires about living arrangements in the future.     12/09/18 1800  Clinical Encounter Type  Visited With Patient;Family  Visit Type Initial  Referral From Patient  Spiritual Encounters  Spiritual Needs Prayer

## 2018-12-09 NOTE — Progress Notes (Signed)
[facility]  ROUNDING NOTE   Subjective:   Na 126  Daughter at bedside.   Objective:  Vital signs in last 24 hours:  Temp:  [97.3 F (36.3 C)-97.9 F (36.6 C)] 97.3 F (36.3 C) (03/03 0750) Pulse Rate:  [77-104] 92 (03/03 0750) Resp:  [22] 22 (03/03 0750) BP: (129-142)/(53-67) 136/59 (03/03 0750) SpO2:  [90 %-94 %] 91 % (03/03 0819)  Weight change:  Filed Weights   11/13/2018 1038 12/01/2018 2252 12/06/18 0408  Weight: 58.7 kg 57.3 kg 59 kg    Intake/Output: I/O last 3 completed shifts: In: 1751.4 [I.V.:1.4; IV Piggyback:1750] Out: 1400 [Urine:1400]   Intake/Output this shift:  No intake/output data recorded.  Physical Exam: General: NAD,   Head: Normocephalic, atraumatic. Moist oral mucosal membranes  Eyes: Anicteric, PERRL  Neck: Supple, trachea midline  Lungs:  Bilateral crackles and wheezes  Heart: Regular rate and rhythm  Abdomen:  Soft, nontender,   Extremities:  + peripheral edema.  Neurologic: Nonfocal, moving all four extremities  Skin: No lesions        Basic Metabolic Panel: Recent Labs  Lab 12/06/18 0404  12/07/18 0434  12/08/18 0446 12/08/18 1029 12/08/18 2226 12/09/18 0411 12/09/18 0903  NA 118*   < > 126*   < > 127* 125* 126* 126* 126*  K 4.3  --  4.5  --  4.3  --   --  4.1 4.4  CL 89*  --  98  --  95*  --   --  93* 93*  CO2 23  --  22  --  25  --   --  26 26  GLUCOSE 162*  --  160*  --  150*  --   --  167* 177*  BUN 18  --  22  --  28*  --   --  34* 33*  CREATININE 0.49  --  0.46  --  0.46  --   --  0.47 0.48  CALCIUM 8.4*  --  8.4*  --  8.7*  --   --  8.3* 9.0  MG 1.7  --   --   --   --   --   --   --   --   PHOS 1.9*  --   --   --   --   --   --   --   --    < > = values in this interval not displayed.    Liver Function Tests: Recent Labs  Lab 11/28/2018 1131 12/09/18 0411  AST 32 30  ALT 25 52*  ALKPHOS 99 69  BILITOT 0.4 0.7  PROT 7.5 5.3*  ALBUMIN 3.7 2.3*   No results for input(s): LIPASE, AMYLASE in the  last 168 hours. No results for input(s): AMMONIA in the last 168 hours.  CBC: Recent Labs  Lab 11/26/2018 1131 12/05/18 0413 12/06/18 0404 12/07/18 0434 12/08/18 0446 12/09/18 0411  WBC 10.0 5.1 15.3* 18.0* 19.3* 21.6*  NEUTROABS 8.5*  --   --   --   --   --   HGB 14.4 12.3 13.1 12.8 13.1 12.2  HCT 40.3 34.3* 37.8 34.5* 37.4 35.3*  MCV 87.2 88.2 91.5 94.5 94.4 90.7  PLT 228 199 214 221 241 225    Cardiac Enzymes: No results for input(s): CKTOTAL, CKMB, CKMBINDEX, TROPONINI in the last 168 hours.  BNP: Invalid input(s): POCBNP  CBG: No results for input(s): GLUCAP in the last 168 hours.  Microbiology: Results for  orders placed or performed during the hospital encounter of 12/02/2018  Culture, blood (Routine x 2)     Status: None   Collection Time: 11/22/2018 11:31 AM  Result Value Ref Range Status   Specimen Description BLOOD BLOOD RIGHT ARM  Final   Special Requests   Final    BOTTLES DRAWN AEROBIC AND ANAEROBIC Blood Culture adequate volume   Culture   Final    NO GROWTH 5 DAYS Performed at [facility], [address] [place], [place] [address]    Report Status 12/09/2018 FINAL  Final  Culture, blood (Routine x 2)     Status: None   Collection Time: 11/16/2018 11:31 AM  Result Value Ref Range Status   Specimen Description BLOOD LEFT ANTECUBITAL  Final   Special Requests   Final    BOTTLES DRAWN AEROBIC AND ANAEROBIC Blood Culture results may not be optimal due to an excessive volume of blood received in culture bottles   Culture   Final    NO GROWTH 5 DAYS Performed at [facility], [address], [place], [place] [address]    Report Status 12/09/2018 FINAL  Final  Culture, sputum-assessment     Status: None   Collection Time: 11/09/2018  5:23 PM  Result Value Ref Range Status   Specimen Description SPUTUM  Final   Special Requests NONE  Final   Sputum evaluation   Final    THIS SPECIMEN IS ACCEPTABLE FOR SPUTUM CULTURE Performed at [facility], [address], [place], [place] [address]    Report Status 12/06/2018 FINAL  Final  Culture, respiratory     Status: None (Preliminary result)   Collection Time: 11/17/2018  5:23 PM  Result Value Ref Range Status   Specimen Description   Final    SPUTUM Performed at [facility], [address] [place], [place] [address]    Special Requests   Final    NONE Reflexed from [id number] Performed at [facility], [address] [place], [place] [address]    Gram Stain   Final    FEW WBC PRESENT, PREDOMINANTLY PMN FEW GRAM NEGATIVE RODS FEW YEAST FEW GRAM POSITIVE COCCI IN CLUSTERS    Culture   Final    MODERATE GRAM NEGATIVE RODS CULTURE REINCUBATED FOR BETTER GROWTH IDENTIFICATION AND SUSCEPTIBILITIES TO FOLLOW Performed at [facility], [place] [address] [place], [place] [address]    Report Status PENDING  Incomplete    Coagulation Studies: Recent Labs    12/07/18 0434 12/08/18 0446 12/09/18 0411  LABPROT 32.5* 24.8* 22.2*  INR 3.2* 2.3* 2.0*    Urinalysis: No results for input(s): COLORURINE, LABSPEC, PHURINE, GLUCOSEU, HGBUR, BILIRUBINUR, KETONESUR, PROTEINUR, UROBILINOGEN, NITRITE, LEUKOCYTESUR in the last 72 hours.  Invalid input(s): APPERANCEUR    Imaging: Dg Chest 2 View  Result Date: 12/08/2018 CLINICAL DATA:  85 year old female with shortness of breath. EXAM: CHEST - 2 VIEW COMPARISON:  12/07/2018 and earlier. FINDINGS: Seated AP and lateral views of the chest. New small bilateral pleural effusions. Increasing lung base opacity on the lateral view appears primarily in the right lung on the AP view. And there is chronic right middle lobe consolidation. Stable cardiac size and mediastinal contours. Calcified aortic atherosclerosis. Visualized tracheal air column is within normal limits. No pneumothorax. Stable pulmonary vascularity without evidence of acute pulmonary edema. Osteopenia with widespread vertebral compression. No acute  osseous abnormality identified. Paucity of bowel gas in the upper abdomen. IMPRESSION: 1. Confluent right lung  base opacity suspicious for pneumonia and/or aspiration. Small new pleural effusions since 11/13/2018. 2. Chronic right middle lobe disease. 3. Osteopenia and vertebral compression fractures. Electronically Signed   By: Genevie Ann M.D.   On: 12/08/2018 07:34   Dg Chest Port 1 View  Result Date: 12/07/2018 CLINICAL DATA:  Acute shortness of breath EXAM: PORTABLE CHEST 1 VIEW COMPARISON:  12/03/2018 chest radiograph and prior studies FINDINGS: New RIGHT LOWER lung consolidation versus atelectasis noted. RIGHT perihilar opacity is unchanged. Cardiomediastinal silhouette is otherwise unremarkable. A trace RIGHT pleural effusion is present. No pneumothorax or acute bony abnormality. IMPRESSION: New RIGHT LOWER lung consolidation versus atelectasis and trace RIGHT pleural effusion. No other significant changes. Electronically Signed   By: Margarette Canada M.D.   On: 12/07/2018 13:36     Medications:   . sodium chloride 500 mL (12/08/18 2337)  . doxycycline (VIBRAMYCIN) IV 100 mg (12/08/18 2339)   . acidophilus   Oral Daily  . amLODipine  5 mg Oral Daily  . budesonide (PULMICORT) nebulizer solution  0.25 mg Nebulization BID  . diltiazem  60 mg Oral Q8H  . fluticasone  2 spray Each Nare Daily  . guaiFENesin  600 mg Oral BID  . ipratropium-albuterol  3 mL Nebulization Q6H  . losartan  100 mg Oral Daily  . methylPREDNISolone (SOLU-MEDROL) injection  60 mg Intravenous Q8H  . multivitamin with minerals  1 tablet Oral Daily  . sodium chloride flush  10 mL Intravenous Q12H  . tolvaptan  15 mg Oral Q24H  . warfarin  2.5 mg Oral ONCE-1800  . Warfarin - Pharmacist Dosing Inpatient   Does not apply q1800   sodium chloride, acetaminophen **OR** acetaminophen, cyclobenzaprine, docusate sodium, hydrALAZINE, loratadine, ondansetron **OR** ondansetron (ZOFRAN) IV, promethazine  Assessment/ Plan:  Ms. [name] is a 85 y.o. white female  with CVA, PE, hypertension, hyperlipidemia, GERD, coronary artery disease, COPD, depression, who was admitted to [facility] on 11/29/2018 for Bronchiectasis with acute exacerbation and Hyponatremia   1. Hyponatremia: acute on chronic. Baseline of 131 on 09/01/18 Acute hyponatremia secondary to hypovolemia and pulmonary issues. - Hold sertraline - PO tolvaptan 15mg  today.   2. Hypertension: urgency on admission.  - losartan, hydralazine, amlodipine, diltiazem.   3. Acute respiratory failure: secondary to acute bronchiectasis - doxycycline - solumedrol.  - O2.   4. History of pulmonary embolism - warfarin  5. Depression: patient was not taking sertraline at home. Continue to hold due to hyponatremia.   6. Hypokalemia - PO potassium chloride.    LOS: 5 Mackenna Kamer 3/3/202010:10 AM

## 2018-12-09 NOTE — Consult Note (Signed)
Consultation Note Date: 12/09/18  Patient Name: [name]  DOB: Oct 16, 1929  MRN: [id number]  Age / Sex: 85 y.o., female  PCP: [name], MD Referring Physician: Max Sane, MD  Reason for Consultation: Establishing goals of care  HPI/Patient Profile: 85 y.o. 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 Hospital admission for bronchiectasis exacerbation, acute respiratory failure, and severe hyponatremia. Nephrology following. Palliative medicine consultation for goals of care.   Clinical Assessment and Goals of Care:  I have reviewed medical records, discussed with care team, and met with patient and daughters Lovey Newcomer and Cecille Rubin) at bedside to discuss diagnosis prognosis, GOC, EOL wishes, disposition and options.  Introduced Palliative Medicine as specialized medical care for people living with serious illness. It focuses on providing relief from the symptoms and stress of a serious illness. The goal is to improve quality of life for both the patient and the family.  We discussed a brief life review of the patient. Originally from [place]. Widowed. Two daughters. Moved to [place] ~20 years ago to be closer to family. Ongoing bronchiectasis exacerbations for the last 5-10 years. Followed by pulmonology.   Discussed events leading up to hospitalization including compression fracture s/p kyphoplasty in November complicated by pneumonia. Since this time, her functional status has been declining and she has been staying with daughter, Lovey Newcomer.   Discussed course of hospitalization including diagnoses and interventions. Discussed underlying chronic conditions  including COPD, recurrent bronchiectasis, suspected MAC infection. Also reviewed recent CT scans and concern with underlying pulmonary hypertension. Discussed concern with disease progression and poor responsiveness to current treatment.   I attempted to elicit values and goals of care important to the patient and daughters. Advanced directives, concepts specific to code status, artifical feeding and hydration, and rehospitalization were considered and discussed. Discussed medical recommendation for limitations to care including DNR/DNI with underlying age, frailty, co-morbidities, and poor outcomes of CPR. Discussed code blue scenario. Patient appears overwhelmed and states "I need to think about it." She wishes to further discuss with her daughters this evening.   The difference between aggressive medical intervention and comfort care was considered. Patient and daughters understand she is a poor candidate for rehab with current clinical condition. Introduced hospice options and philosophy.   Discussed using prn Roxanol to assist with air hunger and dyspnea. Patient agreeable to try low-dose Roxanol.  After further discussion, patient does make appropriate comments of her understanding of current condition and guarded prognosis. She speaks of being "tired" and "fighting" long enough. She shares that she is a woman of [place] faith and knows where is she going. She shares that she has "lived a good life."   Questions and concerns were addressed.  Hard Choices booklet left for review. PMT contact information given. Planned f/u with patient/daughters tomorrow at 10am. Emotional/spiritual support provided.   **  Shortly after, f/u with Cecille Rubin in the hallway. She presents Desire for Natural Death and HCPOA documentation. Reviewed and made copies for the chart. Explained that her mother has clearly written her desire against life-prolong interventions if faced with terminal condition.     SUMMARY OF  RECOMMENDATIONS    Continue FULL code/FULL scope treatment. Patient processing palliative discussion. Wishes to further discuss with her daughters this evening. Planned f/u with patient/family tomorrow around 10am.   Introduced and discussed hospice options and philosophy. Patient/daughters understand she is a poor candidate for rehab.   Code Status/Advance Care Planning:  Full code-recommended DNR/DNI with underlying age, frailty, co-morbidities   Symptom Management:   Roxanol 37m PO q6h prn pain/dyspnea  Palliative Prophylaxis:   Aspiration, Delirium Protocol, Frequent Pain Assessment, Oral Care and Turn Reposition  Psycho-social/Spiritual:   Desire for further Chaplaincy support:yes  Additional Recommendations: Caregiving  Support/Resources, Compassionate Wean Education and Education on Hospice  Prognosis:   Unable to determine: poor prognosis with acute on chronic respiratory failure 2/2 bronchiectasis exacerbation, acute pneumonia vs. Aspiration, COPD, hx of chronic RML lobe disease suspected MAC infection followed by pulmonology, and pulmonary hypertension.   Discharge Planning: To Be Determined      Primary Diagnoses: Present on Admission: . SOB (shortness of breath) . Hyponatremia   I have reviewed the medical record, interviewed the patient and family, and examined the patient. The following aspects are pertinent.  Past Medical History:  Diagnosis Date  . ALLERGIC RHINITIS   . ANXIETY   . Asthma   . Bronchiectasis 12/11/2011   CT chest dx  . CAROTID ARTERY STENOSIS, RIGHT   . Chronic idiopathic constipation   . Chronic rhinitis   . COMMON MIGRAINE   . COPD   . CORONARY ARTERY DISEASE   . CYST/PSEUDOCYST, PANCREAS   . DEPRESSION   . DIVERTICULOSIS, COLON   . DIZZINESS, CHRONIC   . Dyspnea    on exertion  . Esophageal stricture   . GERD   . HYPERLIPIDEMIA   . HYPERTENSION   . Internal hemorrhoids with complication   . OSTEOPOROSIS   . OVERACTIVE  BLADDER   . [place]  . PULMONARY HYPERTENSION, SECONDARY   . [place]   . Tubular adenoma of colon 10/2013  . Unspecified hearing loss    Social History   Socioeconomic History  . Marital status: Widowed    Spouse name: Not on file  . Number of children: 2  . Years of education: Not on file  . Highest education level: Not on file  Occupational History  . 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 Financial resource strain: Not on file  . Food insecurity:    Worry: Not on file    Inability: Not on file  . Transportation needs:    Medical: Not on file    Non-medical: Not on file  Tobacco Use  . Smoking status: Never Smoker  . Smokeless tobacco: Never Used  . Tobacco comment: spouse smoked in the home for 15 years  Substance and Sexual Activity  . Alcohol use: No  . Drug use: No  . Sexual activity: Not Currently  Lifestyle  . Physical activity:    Days per week: Not on file    Minutes per session: Not on file  . Stress: Not on file  Relationships  . Social connections:    Talks on phone: Not on file    Gets together: Not on file  Attends religious service: Not on file    Active member of club or organization: Not on file    Attends meetings of clubs or organizations: Not on file    Relationship status: Not on file  Other Topics Concern  . Not on file  Social History Narrative   Drinks green tea   Family History  Problem Relation Age of Onset  . Heart disease Mother   . Heart attack Mother        first one age 84  . Emphysema Father        was a smoker  . Stroke Father   . Breast cancer Daughter   . Lymphoma Sister   . Colon cancer Neg Hx   . Esophageal cancer Neg Hx   . Rectal cancer Neg Hx    Scheduled Meds: . acidophilus   Oral Daily  . amLODipine  5 mg Oral Daily  . budesonide (PULMICORT) nebulizer solution  0.25 mg Nebulization BID  . diltiazem  60 mg Oral Q8H  . fluticasone  2 spray Each Nare Daily  .  guaiFENesin  600 mg Oral BID  . ipratropium-albuterol  3 mL Nebulization Q6H  . losartan  100 mg Oral Daily  . methylPREDNISolone (SOLU-MEDROL) injection  60 mg Intravenous Q8H  . multivitamin with minerals  1 tablet Oral Daily  . senna-docusate  2 tablet Oral BID  . sodium chloride flush  10 mL Intravenous Q12H  . tolvaptan  15 mg Oral Q24H  . warfarin  2.5 mg Oral ONCE-1800  . Warfarin - Pharmacist Dosing Inpatient   Does not apply q1800   Continuous Infusions: . sodium chloride 500 mL (12/08/18 2337)  . doxycycline (VIBRAMYCIN) IV 100 mg (12/09/18 1139)   PRN Meds:.sodium chloride, acetaminophen **OR** acetaminophen, cyclobenzaprine, hydrALAZINE, loratadine, ondansetron **OR** ondansetron (ZOFRAN) IV, promethazine Medications Prior to Admission:  Prior to Admission medications   Medication Sig Start Date End Date Taking? Authorizing Provider  acetaminophen (TYLENOL) 500 MG tablet Take 1,000 mg by mouth every 6 (six) hours as needed for moderate pain.   Yes [provider]  albuterol (ACCUNEB) 0.63 MG/3ML nebulizer solution Take 3 mLs (0.63 mg total) by nebulization every 6 (six) hours as needed for wheezing. 10/06/18  Yes [name], MD  amLODipine (NORVASC) 5 MG tablet TAKE 1 TABLET EVERY DAY 09/29/18  Yes [name], MD  budesonide [facility]) 32 MCG/ACT nasal spray Place 1 spray into both nostrils daily.    Yes [provider]  doxycycline (VIBRA-TABS) 100 MG tablet Take 1 tablet (100 mg total) by mouth 2 (two) times daily. 12/01/18  Yes Collene Gobble, MD  loratadine (CLARITIN) 10 MG tablet Take 10 mg by mouth daily as needed for allergies.    Yes [provider]  losartan (COZAAR) 100 MG tablet TAKE 1 TABLET EVERY DAY 06/10/18  Yes [name], MD  Multiple Vitamins-Minerals [facility]) TABS Take 1 tablet by mouth daily.     Yes [provider]  Probiotic Product (PROBIOTIC DAILY PO) Take 1 tablet by mouth daily.   Yes [provider]  promethazine (PHENERGAN) 25 MG tablet Take 1 tablet (25 mg total) by mouth every 6 (six) hours as needed for nausea or vomiting. 11/07/17  Yes [name], MD  warfarin (COUMADIN) 5 MG tablet Take 1/2 tablet daily except 1 tablet on Mon and Fri or [place]  90 DAY Patient taking differently: Take 2.5 mg by mouth one  time only at 6 PM. Take 1/2 tablet daily AS DIRECTED BY ANTICOAGULATION CLINIC  90 DAY 09/29/18  Yes [name], MD  albuterol (VENTOLIN HFA) 108 (90 Base) MCG/ACT inhaler INHALE 2 PUFFS INTO THE LUNGS EVERY 6 HOURS AS NEEDED FOR WHEEZING OR SHORTNESS OF BREATH. 10/06/18   [name], MD  cyclobenzaprine (FLEXERIL) 10 MG tablet Take 1 tablet (10 mg total) by mouth 3 (three) times daily as needed for muscle spasms. 09/29/18   [name], MD  Digestive Enzymes (PAPAYA AND ENZYMES) CHEW Chew 2 tablets by mouth 3 (three) times daily with meals as needed (digestion of big meals).     [provider]  furosemide (LASIX) 20 MG tablet Take 1 tablet (20 mg total) by mouth daily. Patient not taking: Reported on 12/01/2018 09/01/18   [name], MD  HYDROcodone-acetaminophen [facility]) 7.5-325 MG tablet Take 1 tablet by mouth every 6 (six) hours as needed for moderate pain. Patient not taking: Reported on 11/03/2018 09/01/18   [name], MD  HYDROcodone-acetaminophen (NORCO/VICODIN) 5-325 MG tablet Take 1 tablet by mouth every 6 (six) hours as needed for moderate pain. Patient not taking: Reported on 11/03/2018 08/21/18   Ashok Pall, MD  sertraline (ZOLOFT) 50 MG tablet Take 1 tablet (50 mg total) by mouth daily. Patient not taking: Reported on 12/01/2018 05/26/18   [name], MD   Allergies  Allergen Reactions  . Raloxifene Other (See Comments)    REACTION: risk of recurrent stroke  . Sulfonamide Derivatives Anaphylaxis    REACTION: tongue swells  . Amoxicillin-Pot Clavulanate Diarrhea    REACTION: diarrhea Has patient  had a PCN reaction causing immediate rash, facial/tongue/throat swelling, SOB or lightheadedness with hypotension: unknown Has patient had a PCN reaction causing severe rash involving mucus membranes or skin necrosis: unknown Has patient had a PCN reaction that required hospitalization : unknown Has patient had a PCN reaction occurring within the last 10 years: yes   pt states she should be able to take penicillin, but cant remember actually taking it   . Ciclesonide Other (See Comments)    REACTION: bad smell  . Ciprofloxacin Other (See Comments)    dizziness   Review of Systems  Constitutional: Positive for activity change and appetite change.  Respiratory: Positive for cough and shortness of breath.   Neurological: Positive for weakness.   Physical Exam Vitals signs and nursing note reviewed.  Constitutional:      General: She is awake.     Appearance: She is cachectic. She is ill-appearing.  HENT:     Head: Normocephalic and atraumatic.  Pulmonary:     Effort: No tachypnea or accessory muscle usage.     Comments: Dyspnea at rest Skin:    General: Skin is warm and dry.  Neurological:     Mental Status: She is alert and oriented to person, place, and time.  Psychiatric:        Mood and Affect: Mood normal.        Speech: Speech normal.        Behavior: Behavior normal.        Cognition and Memory: Cognition normal.    Vital Signs: BP (!) 136/59 (BP Location: Right Arm)   Pulse 92   Temp (!) 97.3 F (36.3 C) (Oral)   Resp (!) 22   Ht 5' 4" (1.626 m)   Wt 59 kg   SpO2 91%   BMI 22.31 kg/m  Pain Scale: 0-10  Pain Score: 0-No pain   SpO2: SpO2: 91 % O2 Device:SpO2: 91 % O2 Flow Rate: .O2 Flow Rate (L/min): 2 L/min  IO: Intake/output summary:   Intake/Output Summary (Last 24 hours) at 12/09/2018 1354 Last data filed at 12/09/2018 1239 Gross per 24 hour  Intake -  Output 1050 ml  Net -1050 ml    LBM: Last BM Date: 12/03/18 Baseline Weight: Weight: 58.7  kg Most recent weight: Weight: (pt. complained back pain, could mot lie flat for weight)     Palliative Assessment/Data: PPS 40%   Flowsheet Rows     Most Recent Value  Intake Tab  Referral Department  Hospitalist  Unit at Time of Referral  Cardiac/Telemetry Unit  Palliative Care Primary Diagnosis  Pulmonary  Palliative Care Type  New Palliative care  Reason for referral  Clarify Goals of Care  Date first seen by Palliative Care  12/09/18  Clinical Assessment  Palliative Performance Scale Score  40%  Psychosocial & Spiritual Assessment  Palliative Care Outcomes  Patient/Family meeting held?  Yes  Who was at the meeting?  patient, two daughters  Palliative Care Outcomes  Clarified goals of care, Provided end of life care assistance, Provided psychosocial or spiritual support, ACP counseling assistance, Improved non-pain symptom therapy, Counseled regarding hospice      Time In: 1250 Time Out: 1400 Time Total: 70 Greater than 50%  of this time was spent counseling and coordinating care related to the above assessment and plan.  Signed by:  Ihor Dow, FNP-C Palliative Medicine Team  Phone: [phone] Fax: [phone]   Please contact Palliative Medicine Team phone at [phone] for questions and concerns.  For individual provider: See Shea Evans

## 2018-12-09 NOTE — Consult Note (Addendum)
ANTICOAGULATION CONSULT NOTE - Follow up [place] for Warfarin Dosing Indication: pulmonary embolus  Allergies  Allergen Reactions  . Raloxifene Other (See Comments)    REACTION: risk of recurrent stroke  . Sulfonamide Derivatives Anaphylaxis    REACTION: tongue swells  . Amoxicillin-Pot Clavulanate Diarrhea    REACTION: diarrhea Has patient had a PCN reaction causing immediate rash, facial/tongue/throat swelling, SOB or lightheadedness with hypotension: unknown Has patient had a PCN reaction causing severe rash involving mucus membranes or skin necrosis: unknown Has patient had a PCN reaction that required hospitalization : unknown Has patient had a PCN reaction occurring within the last 10 years: yes   pt states she should be able to take penicillin, but cant remember actually taking it   . Ciclesonide Other (See Comments)    REACTION: bad smell  . Ciprofloxacin Other (See Comments)    dizziness   Patient Measurements: Height: 5\' 4"  (162.6 cm) Weight: (pt. complained back pain, could mot lie flat for weight) IBW/kg (Calculated) : 54.7  Vital Signs: Temp: 97.3 F (36.3 C) (03/03 0750) Temp Source: Oral (03/03 0750) BP: 136/59 (03/03 0750) Pulse Rate: 92 (03/03 0750)  Labs: Recent Labs    12/07/18 0434 12/08/18 0446 12/09/18 0411  HGB 12.8 13.1 12.2  HCT 34.5* 37.4 35.3*  PLT 221 241 225  LABPROT 32.5* 24.8* 22.2*  INR 3.2* 2.3* 2.0*  CREATININE 0.46 0.46 0.47    Estimated Creatinine Clearance: 43.6 mL/min (by C-G formula based on SCr of 0.47 mg/dL).   Assessment: Pharmacy consulted for warfarin dosing and monitor in 85 yo female with PMH of PE. Patient is being admitted with acute respiratory failure secondary to acute bronchiectasis and is receiving Doxycycline BID. INR on admission if supratherapeutic  @ 3.5. Last dose reported to be 2/26 PM.    Home Regimen: Warfarin 2.5mg  Tues, Weds, Thurs, Sat, Sun                             Warfarin  5mg  Mon, Fri   DATE INR DOSE 2/27 3.5  held 2/28 3.7 held 2/29  3.4 held 3/01 3.2 Held 3/02     2.3       2.5mg  3/3       2.0       2.5mg   Goal of Therapy:  INR 2-3 Monitor platelets by anticoagulation protocol: Yes   Plan:  Will continue Warfarin tonight, will order Warfarin 2.5mg  times one.  (would consider a higher dose with INR 2.0, but pt on doxycycline which may bolster Warfarin effect)  Will recheck INR daily while on antibiotics per protocol, and CBCs at least every 3 days.   Pharmacy will F/U with AM labs and continue to order warfarin dose based on INRs.   Lu Duffel, PharmD, BCPS Clinical Pharmacist 12/09/2018 7:56 AM

## 2018-12-10 DIAGNOSIS — F411 Generalized anxiety disorder: Secondary | ICD-10-CM

## 2018-12-10 DIAGNOSIS — Z66 Do not resuscitate: Secondary | ICD-10-CM

## 2018-12-10 DIAGNOSIS — Z515 Encounter for palliative care: Secondary | ICD-10-CM

## 2018-12-10 LAB — PROTIME-INR
INR: 2.7 — ABNORMAL HIGH (ref 0.8–1.2)
Prothrombin Time: 28.6 seconds — ABNORMAL HIGH (ref 11.4–15.2)

## 2018-12-10 LAB — SODIUM
SODIUM: 129 mmol/L — AB (ref 135–145)
Sodium: 132 mmol/L — ABNORMAL LOW (ref 135–145)

## 2018-12-10 LAB — CULTURE, RESPIRATORY W GRAM STAIN: Culture: NORMAL

## 2018-12-10 LAB — BASIC METABOLIC PANEL
Anion gap: 6 (ref 5–15)
BUN: 27 mg/dL — ABNORMAL HIGH (ref 8–23)
CO2: 27 mmol/L (ref 22–32)
CREATININE: 0.4 mg/dL — AB (ref 0.44–1.00)
Calcium: 9 mg/dL (ref 8.9–10.3)
Chloride: 98 mmol/L (ref 98–111)
GFR calc Af Amer: >60 mL/min (ref >60)
GFR calc non Af Amer: >60 mL/min (ref >60)
Glucose, Bld: 191 mg/dL — ABNORMAL HIGH (ref 70–99)
Potassium: 4.7 mmol/L (ref 3.5–5.1)
Sodium: 131 mmol/L — ABNORMAL LOW (ref 135–145)

## 2018-12-10 LAB — CBC
HCT: 33.3 % — ABNORMAL LOW (ref 36.0–46.0)
Hemoglobin: 12 g/dL (ref 12.0–15.0)
MCH: 34.4 pg — AB (ref 26.0–34.0)
MCHC: 36 g/dL (ref 30.0–36.0)
MCV: 95.4 fL (ref 80.0–100.0)
Platelets: 228 10*3/uL (ref 150–400)
RBC: 3.49 MIL/uL — ABNORMAL LOW (ref 3.87–5.11)
RDW: 13.9 % (ref 11.5–15.5)
WBC: 23.4 10*3/uL — ABNORMAL HIGH (ref 4.0–10.5)
nRBC: 0 % (ref 0.0–0.2)

## 2018-12-10 MED ORDER — BISACODYL 5 MG PO TBEC
5.0000 mg | DELAYED_RELEASE_TABLET | Freq: Once | ORAL | Status: AC
  Administered 2018-12-10: 5 mg via ORAL
  Filled 2018-12-10: qty 1

## 2018-12-10 MED ORDER — MORPHINE SULFATE (CONCENTRATE) 10 MG/0.5ML PO SOLN
5.0000 mg | ORAL | Status: DC | PRN
  Administered 2018-12-10 – 2018-12-11 (×4): 5 mg via ORAL
  Filled 2018-12-10 (×5): qty 1

## 2018-12-10 MED ORDER — POLYETHYLENE GLYCOL 3350 17 G PO PACK: 17.0000 g | PACK | Freq: Every day | ORAL | Status: DC | PRN

## 2018-12-10 MED ORDER — FUROSEMIDE 10 MG/ML IJ SOLN
40.0000 mg | Freq: Once | INTRAMUSCULAR | Status: AC
  Administered 2018-12-10: 40 mg via INTRAVENOUS
  Filled 2018-12-10: qty 4

## 2018-12-10 MED ORDER — ALPRAZOLAM 0.25 MG PO TABS
0.2500 mg | ORAL_TABLET | Freq: Three times a day (TID) | ORAL | Status: DC | PRN
  Administered 2018-12-10: 0.25 mg via ORAL
  Filled 2018-12-10: qty 1

## 2018-12-10 MED ORDER — FUROSEMIDE 40 MG PO TABS
40.0000 mg | ORAL_TABLET | Freq: Every day | ORAL | Status: DC
  Filled 2018-12-10: qty 1

## 2018-12-10 MED ORDER — WARFARIN SODIUM 2.5 MG PO TABS
2.5000 mg | ORAL_TABLET | Freq: Once | ORAL | Status: AC
  Administered 2018-12-10: 2.5 mg via ORAL
  Filled 2018-12-10: qty 1

## 2018-12-10 NOTE — Progress Notes (Signed)
PT Cancellation Note  Patient Details Name: [name] MRN: [id number] DOB: 19-Sep-1930   Cancelled Treatment:    Reason Eval/Treat Not Completed: Other (comment)   Pt up to commode on attempt this am.  Returned in PM.  Pt asleep.  Family requested to hold session for today.  Will continue as appropriate.   Chesley Noon 12/10/2018, 4:00 PM

## 2018-12-10 NOTE — Progress Notes (Signed)
[facility]  ROUNDING NOTE   Subjective:   Na 131 -tolvaptan yesterday  Daughters at bedside.   Palliative meeting yesterday.   Objective:  Vital signs in last 24 hours:  Temp:  [97.7 F (36.5 C)-98.3 F (36.8 C)] 98.1 F (36.7 C) (03/04 0417) Pulse Rate:  [88-100] 88 (03/04 0937) Resp:  [16-20] 20 (03/04 0937) BP: (123-158)/(48-55) 123/48 (03/04 0937) SpO2:  [89 %-91 %] 89 % (03/04 0937)  Weight change:  Filed Weights   11/23/2018 1038 11/16/2018 2252 12/06/18 0408  Weight: 58.7 kg 57.3 kg 59 kg    Intake/Output: I/O last 3 completed shifts: In: -  Out: 2250 [Urine:2250]   Intake/Output this shift:  Total I/O In: -  Out: 600 [Urine:600]  Physical Exam: General: NAD,   Head: Normocephalic, atraumatic. Moist oral mucosal membranes  Eyes: Anicteric, PERRL  Neck: Supple, trachea midline  Lungs:  Clear bilaterally  Heart: Regular rate and rhythm  Abdomen:  Soft, nontender,   Extremities:  + peripheral edema.  Neurologic: Nonfocal, moving all four extremities  Skin: No lesions        Basic Metabolic Panel: Recent Labs  Lab 12/06/18 0404  12/07/18 0434  12/08/18 0446  12/09/18 0411 12/09/18 0903 12/09/18 1639 12/09/18 2215 12/10/18 0018 12/10/18 0402  NA 118*   < > 126*   < > 127*   < > 126* 126* 128* 130* 129* 131*  K 4.3  --  4.5  --  4.3  --  4.1 4.4  --   --   --  4.7  CL 89*  --  98  --  95*  --  93* 93*  --   --   --  98  CO2 23  --  22  --  25  --  26 26  --   --   --  27  GLUCOSE 162*  --  160*  --  150*  --  167* 177*  --   --   --  191*  BUN 18  --  22  --  28*  --  34* 33*  --   --   --  27*  CREATININE 0.49  --  0.46  --  0.46  --  0.47 0.48  --   --   --  0.40*  CALCIUM 8.4*  --  8.4*  --  8.7*  --  8.3* 9.0  --   --   --  9.0  MG 1.7  --   --   --   --   --   --   --   --   --   --   --   PHOS 1.9*  --   --   --   --   --   --   --   --   --   --   --    < > = values in this interval not displayed.    Liver Function  Tests: Recent Labs  Lab 11/08/2018 1131 12/09/18 0411  AST 32 30  ALT 25 52*  ALKPHOS 99 69  BILITOT 0.4 0.7  PROT 7.5 5.3*  ALBUMIN 3.7 2.3*   No results for input(s): LIPASE, AMYLASE in the last 168 hours. No results for input(s): AMMONIA in the last 168 hours.  CBC: Recent Labs  Lab 11/20/2018 1131  12/06/18 0404 12/07/18 0434 12/08/18 0446 12/09/18 0411 12/10/18 0402  WBC 10.0   < > 15.3* 18.0* 19.3* 21.6*  23.4*  NEUTROABS 8.5*  --   --   --   --   --   --   HGB 14.4   < > 13.1 12.8 13.1 12.2 12.0  HCT 40.3   < > 37.8 34.5* 37.4 35.3* 33.3*  MCV 87.2   < > 91.5 94.5 94.4 90.7 95.4  PLT 228   < > 214 221 241 225 228   < > = values in this interval not displayed.    Cardiac Enzymes: No results for input(s): CKTOTAL, CKMB, CKMBINDEX, TROPONINI in the last 168 hours.  BNP: Invalid input(s): POCBNP  CBG: Recent Labs  Lab 12/09/18 2130  GLUCAP 184*    Microbiology: Results for orders placed or performed during the hospital encounter of 11/26/2018  Culture, blood (Routine x 2)     Status: None   Collection Time: 11/16/2018 11:31 AM  Result Value Ref Range Status   Specimen Description BLOOD BLOOD RIGHT ARM  Final   Special Requests   Final    BOTTLES DRAWN AEROBIC AND ANAEROBIC Blood Culture adequate volume   Culture   Final    NO GROWTH 5 DAYS Performed at [facility], [address] [place], [place] [address]    Report Status 12/09/2018 FINAL  Final  Culture, blood (Routine x 2)     Status: None   Collection Time: 11/09/2018 11:31 AM  Result Value Ref Range Status   Specimen Description BLOOD LEFT ANTECUBITAL  Final   Special Requests   Final    BOTTLES DRAWN AEROBIC AND ANAEROBIC Blood Culture results may not be optimal due to an excessive volume of blood received in culture bottles   Culture   Final    NO GROWTH 5 DAYS Performed at [facility], [address] [place], [place] [address]    Report Status 12/09/2018 FINAL  Final   Culture, sputum-assessment     Status: None   Collection Time: 12/03/2018  5:23 PM  Result Value Ref Range Status   Specimen Description SPUTUM  Final   Special Requests NONE  Final   Sputum evaluation   Final    THIS SPECIMEN IS ACCEPTABLE FOR SPUTUM CULTURE Performed at [facility], [address] [place], [place] [address]    Report Status 12/06/2018 FINAL  Final  Culture, respiratory     Status: None   Collection Time: 11/28/2018  5:23 PM  Result Value Ref Range Status   Specimen Description   Final    SPUTUM Performed at [facility], [address] [place], [place] [address]    Special Requests   Final    NONE Reflexed from [phone] Performed at [facility], [address] [place], [place] [address]    Gram Stain   Final    FEW WBC PRESENT, PREDOMINANTLY PMN FEW GRAM NEGATIVE RODS FEW YEAST FEW GRAM POSITIVE COCCI IN CLUSTERS    Culture   Final    Consistent with normal respiratory flora. Performed at [facility], [address], [place], [place] [address]    Report Status 12/10/2018 FINAL  Final    Coagulation Studies: Recent Labs    12/08/18 0446 12/09/18 0411 12/10/18 0402  LABPROT 24.8* 22.2* 28.6*  INR 2.3* 2.0* 2.7*    Urinalysis: No results for input(s): COLORURINE, LABSPEC, PHURINE, GLUCOSEU, HGBUR, BILIRUBINUR, KETONESUR, PROTEINUR, UROBILINOGEN, NITRITE, LEUKOCYTESUR in the last 72 hours.  Invalid input(s): APPERANCEUR    Imaging: No results found.   Medications:   .  sodium chloride 500 mL (12/08/18 2337)  . cefTRIAXone (ROCEPHIN)  IV 2 g (12/09/18 1744)   . acidophilus   Oral Daily  . amLODipine  5 mg Oral Daily  . bisacodyl  5 mg Oral Once  . budesonide (PULMICORT) nebulizer solution  0.25 mg Nebulization BID  . diltiazem  60 mg Oral Q8H  . fluticasone  2 spray Each Nare Daily  . [START ON 12/11/2018] furosemide  40 mg Oral Daily  . guaiFENesin  600 mg Oral BID  . ipratropium-albuterol  3 mL  Nebulization Q6H  . losartan  100 mg Oral Daily  . methylPREDNISolone (SOLU-MEDROL) injection  60 mg Intravenous Q8H  . multivitamin with minerals  1 tablet Oral Daily  . senna-docusate  2 tablet Oral BID  . sodium chloride flush  10 mL Intravenous Q12H  . warfarin  2.5 mg Oral ONCE-1800  . Warfarin - Pharmacist Dosing Inpatient   Does not apply q1800   sodium chloride, acetaminophen **OR** acetaminophen, ALPRAZolam, bisacodyl, cyclobenzaprine, hydrALAZINE, loratadine, morphine CONCENTRATE, ondansetron **OR** ondansetron (ZOFRAN) IV, promethazine  Assessment/ Plan:  Ms. MARIN WISNER is a 85 y.o. white female  with CVA, PE, hypertension, hyperlipidemia, GERD, coronary artery disease, COPD, depression, who was admitted to [facility] on 11/13/2018 for Bronchiectasis with acute exacerbation and Hyponatremia   1. Hyponatremia: acute on chronic. Baseline of 131 on 09/01/18 Acute hyponatremia secondary to hypovolemia and pulmonary issues. - Hold sertraline - status post PO tolvaptan - Fluid restirction - Restart furosemide   2. Hypertension: urgency on admission.  - losartan, hydralazine, amlodipine, diltiazem.   3. Acute respiratory failure: secondary to acute bronchiectasis - doxycycline - solumedrol.  - O2.   4. History of pulmonary embolism - warfarin  5. Depression: patient was not taking sertraline at home. Continue to hold due to hyponatremia.   6. Hypokalemia - PO potassium chloride.    LOS: 6 [name] 3/4/202011:48 AM

## 2018-12-10 NOTE — Progress Notes (Signed)
[facility] NAME: [name]    MR#:  [id number]  DATE OF BIRTH:  November 08, 1929  SUBJECTIVE:  CHIEF COMPLAINT:   Chief Complaint  Patient presents with  . Shortness of Breath  . Cough   Most recent sodium 131. Shortness of breath worsening with short term relief from PRN Roxanol.  Endorses anxiety, with relief from as needed Xanax.  Her appetite is poor. Met with palliative care, goals of care discussion. Family is open to hospice she qualifies. Focus on symptom relief.    REVIEW OF SYSTEMS:  Review of Systems  Constitutional: Negative forchillsand fever.Positive for malaise/fatigue. HENT: Negative forhearing lossand tinnitus.  Eyes: Negative forblurred visionand double vision.  Respiratory: Positive forcoughand shortness of breath.  Cardiovascular: Negative forchest painand palpitations. Positive for edema  Gastrointestinal: Negative forheartburn,nauseaand vomiting.  Genitourinary: Negative fordysuriaand urgency.  Musculoskeletal:Positiveforgeneralized weakness, myalgiaat left anterior lower extremity(may have hit leg with walker at home).Only with pressure.Improving. Skin: Negative foritchingand rash.Positive for bruising (on warfarin) Neurological: Negative fordizziness,seizuresand headaches.  Psychiatric/Behavioral: Negative fordepressionand hallucinations. Positive for anxiety. Confusion improved. DRUG ALLERGIES:   Allergies  Allergen Reactions  . Raloxifene Other (See Comments)    REACTION: risk of recurrent stroke  . Sulfonamide Derivatives Anaphylaxis    REACTION: tongue swells  . Amoxicillin-Pot Clavulanate Diarrhea    REACTION: diarrhea Has patient had a PCN reaction causing immediate rash, facial/tongue/throat swelling, SOB or lightheadedness with hypotension: unknown Has patient had a PCN reaction causing severe rash involving mucus membranes or skin necrosis:  unknown Has patient had a PCN reaction that required hospitalization : unknown Has patient had a PCN reaction occurring within the last 10 years: yes   pt states she should be able to take penicillin, but cant remember actually taking it   . Ciclesonide Other (See Comments)    REACTION: bad smell  . Ciprofloxacin Other (See Comments)    dizziness   VITALS:  Blood pressure (!) 122/42, pulse (!) 103, temperature 98 F (36.7 C), temperature source Oral, resp. rate 20, height _0  (1.626 m), weight 59 kg, SpO2 93 %. PHYSICAL EXAMINATION:  Physical Exam Constitutional: She isoriented to person, place, and time. She appearswell-developedand well-nourished.  HENT:  Head:Normocephalicand atraumatic.  Right Ear: External earnormal.  Mouth/Throat:Oropharynx is clear and moist.  Eyes:Pupils are equal, round, and reactive to light. Conjunctivaeand EOMare normal. Right eye exhibits no discharge. Neck:Normal range of motion.Neck supple.No tracheal deviationpresent.  Cardiovascular:Normal rate,regular rhythmand normal heart sounds. Positive for edema. Respiratory:There are diffuse crackles present.There is increased work of breathing with use of accessory muscles. There is conversational dyspnea. When seen she is sitting on the commode and breathing is shallow. ZD:GUYQ.Bowel sounds are normal. She exhibitsno distension. There isno abdominal tenderness.  Musculoskeletal:Tender to palpation at left anterior lower extremity.Normal range of motion.  General: No edema.  Neurological: She isalertand oriented to person, place, and time. Nocranial nerve deficit.  Skin:Scattered ecchymoses.Skin iswarm. She isnot diaphoretic. Noerythema.  Psychiatric: She has anormal mood and affect given the situation. There is some anxiety.  LABORATORY PANEL:  Female CBC Recent Labs  Lab 12/10/18 0402  WBC 23.4*  HGB 12.0  HCT 33.3*  PLT 228    ------------------------------------------------------------------------------------------------------------------ Chemistries  Recent Labs  Lab 12/06/18 0404  12/09/18 0411  12/10/18 0402  NA 118*   < > 126*   < > 131*  K 4.3   < > 4.1   < > 4.7  CL 89*   < >  93*   < > 98  CO2 23   < > 26   < > 27  GLUCOSE 162*   < > 167*   < > 191*  BUN 18   < > 34*   < > 27*  CREATININE 0.49   < > 0.47   < > 0.40*  CALCIUM 8.4*   < > 8.3*   < > 9.0  MG 1.7  --   --   --   --   AST  --   --  30  --   --   ALT  --   --  52*  --   --   ALKPHOS  --   --  69  --   --   BILITOT  --   --  0.7  --   --    < > = values in this interval not displayed.   RADIOLOGY:  No results found. ASSESSMENT AND PLAN:   Tabitha [place] Schillois a 54 year oldfemalewithhistory ofCVA, PE, hypertension, hyperlipidemia, GERD, coronary artery disease, COPD, depression, who was admitted to [facility] on2/27/2020for Bronchiectasis with acute exacerbation, hyponatremia, and generalized fatigue.  Plan:  1.Acute respiratory failureacute bronchiectasis with iatrogenic fluid overload. Question acute pneumonia vs. Aspiration. Continue nebulizer treatments. Antibiotics.Solumedrol. -CXR portable 12/07/2018 repeat - shows new consolidation vs. Atelectasis, right pleural effusion - CXR 2-view 12/08/2018 -confluent right lung base opacity - Diuresis with lasix per nephrology WBC elevated to23.4today. Patient is on steroids. Repeat CBC tomorrow AM.  She was on doxycycline.Sputumculture growing GNR.Procalcitonin less than 0.1.Switched to Rocephin per pharmacy recommendations 12/09/2018- . Morphine PRN to alleviate discomfort and aid breathing. Xanax.   2.Severe hyponatremiawith initial sodium of 112 Likely due tounderlying pulmonary process No seizures. Had some mild confusion which is reported to be improved. Most recent sodium 131. Has not been stable.Continue monitoring.  -Nephrology/Dr. Juleen China  following:discontinuedhypertonic saline, serial sodium and neuro checks. Hold sertraline.Discontinued normal saline.  Restarted diuresis with Lasix 12/10/2018.   Trial of Tolvaptan.  PO intake.  Serum osmolality of 245. Urine sodium of 21. Urine osmolality of 277.  [id number] cortisol level 12.0  3. Hypertensive urgencyon presentation  Blood pressure appears better controlled this morning on current regimen. Continue amlodipine, diltiazem, hydralazine, losartan  4.Sinus tachycardia due to underlying lung disease  5.Depression, anxietyhold sertralinefor hyponatremia, xanax PRN  6.History of PEcontinue Coumadin INR2.7 today.Goal 2-3.Pharmacy following  7. Deconditioning, multiple comorbiditiesseen by OT and PT. Recommending SNF.  LCSW following. Palliative care following, patient and family agreed on DNR/DNI, outpatient palliative care at minimum. Hospice if she qualifies. Suspect it will be a future need if not qualified at discharge.   DVT prophylaxis;patient already on Coumadin. See above.   All the records are reviewed and case is discussed with Care Management/Social Worker. Management plans discussed with the patient and/or family and they are in agreement.  CODE STATUS: DNR  TOTAL TIME TAKING CARE OF THIS PATIENT: 30 minutes.   More than 50% of the time was spent in counseling/coordination of care: YES  POSSIBLE D/C IN 1-2 DAYS, DEPENDING ON CLINICAL CONDITION & placement.    Ayvah Caroll PA-C on 12/10/2018 at 5:28 PM  Between 7am to 6pm - Pager - [phone]  After 6 pm go to [email or web address] - Proofreader  [facility]  Office  [phone]  CC: Primary care physician; [name], MD  Note: This dictation was prepared with Dragon dictation along with smaller phrase technology. Any transcriptional errors that  result from this process are unintentional.

## 2018-12-10 NOTE — Consult Note (Addendum)
ANTICOAGULATION CONSULT NOTE - Follow up [place] for Warfarin Dosing Indication: pulmonary embolus  Allergies  Allergen Reactions  . Raloxifene Other (See Comments)    REACTION: risk of recurrent stroke  . Sulfonamide Derivatives Anaphylaxis    REACTION: tongue swells  . Amoxicillin-Pot Clavulanate Diarrhea    REACTION: diarrhea Has patient had a PCN reaction causing immediate rash, facial/tongue/throat swelling, SOB or lightheadedness with hypotension: unknown Has patient had a PCN reaction causing severe rash involving mucus membranes or skin necrosis: unknown Has patient had a PCN reaction that required hospitalization : unknown Has patient had a PCN reaction occurring within the last 10 years: yes   pt states she should be able to take penicillin, but cant remember actually taking it   . Ciclesonide Other (See Comments)    REACTION: bad smell  . Ciprofloxacin Other (See Comments)    dizziness   Patient Measurements: Height: 5\' 4"  (162.6 cm) Weight: (pt. complained back pain, could mot lie flat for weight) IBW/kg (Calculated) : 54.7  Vital Signs: Temp: 98.1 F (36.7 C) (03/04 0417) Temp Source: Oral (03/04 0417) BP: 142/55 (03/04 0417) Pulse Rate: 93 (03/04 0417)  Labs: Recent Labs    12/08/18 0446 12/09/18 0411 12/09/18 0903 12/10/18 0402  HGB 13.1 12.2  --  12.0  HCT 37.4 35.3*  --  33.3*  PLT 241 225  --  228  LABPROT 24.8* 22.2*  --  28.6*  INR 2.3* 2.0*  --  2.7*  CREATININE 0.46 0.47 0.48 0.40*    Estimated Creatinine Clearance: 43.6 mL/min (A) (by C-G formula based on SCr of 0.4 mg/dL (L)).   Assessment: Pharmacy consulted for warfarin dosing and monitor in 85 yo female with PMH of PE. Patient is being admitted with acute respiratory failure secondary to acute bronchiectasis and is receiving Doxycycline BID. INR on admission if supratherapeutic  @ 3.5. Last dose reported to be 2/26 PM.    Home Regimen: Warfarin 2.5mg  Tues, Weds,  Thurs, Sat, Sun                             Warfarin 5mg  Mon, Fri   DATE INR DOSE 2/27 3.5  held 2/28 3.7 held 2/29  3.4 held 3/01 3.2 Held 3/02     2.3       2.5mg  3/3       2.0       2.5mg  3/4       2.7       Goal of Therapy:  INR 2-3 Monitor platelets by anticoagulation protocol: Yes   Plan:  Patient in goal INR range, will order warfarin 2.5mg  times one   Will recheck INR daily while on antibiotics per protocol, and CBCs at least every 3 days.   Pharmacy will F/U with AM labs and continue to order warfarin dose based on INRs.   Lu Duffel, PharmD, BCPS Clinical Pharmacist 12/10/2018 8:33 AM

## 2018-12-10 NOTE — Progress Notes (Signed)
OT Cancellation Note  Patient Details Name: [name] MRN: [id number] DOB: [birth year]   Cancelled Treatment:    Reason Eval/Treat Not Completed: Patient declined, no reason specified. OT attempt x2 to see pt this pm. On first attempt, pt with visitors in room and eating lunch. Requested OT return after she finished lunch. On second attempt pt asleep in room. Family stated she was tired, and request OT return next day. Will re-attempt at a later date/time as available and pt medically appropriate for tx.   Shara Blazing, M.S., OTR/L Ascom: [phone] 12/10/18, 2:44 PM

## 2018-12-10 NOTE — Progress Notes (Signed)
Daily Progress Note   Patient Name: [name]       Date: 12/10/2018 DOB: Feb 17, 1930  Age: 85 y.o. MRN#: [id number] Attending Physician: Max Sane, MD Primary Care Physician: [name], MD Admit Date: 11/26/2018  Reason for Consultation/Follow-up: Establishing goals of care  Subjective:  Patient awake, alert, oriented. Able to participate in [place] discussion. Slight dyspnea at rest. She does state relief from prn Roxanol but wears off quickly. She ate 50% of breakfast. Worked with PT yesterday and sat on side of bed.   GOC:   F/u GOC with patient and two daughter [name] and [name]) at bedside.   Discussed course of hospitalization including diagnoses and interventions. Discussed irreversible conditions contributing to acute on chronic respiratory failure.   Further discussed MOST form and her wishes regarding heroic measures at end-of-life. Completed MOST form with patient and daughters. Patient speaks of her wishes for DNR/DNI, comfort measures, IVF/ABX for time trial, and NO feeding tube. Durable DNR completed. Copies made for daughters and chart.   Discussed disposition plan with SW Randall Hiss present. Challenging disposition. Patient does not yet appear to be eligible for hospice facility placement. Sandy unable to care for her mother at home with hospice services. Discussed possible plan for SNF to attempt rehab and then likely long-term care with hospice services following. Answered questions and concerns.   Discussed hospice philosophy and role of symptom management to ensure comfort and dignity at EOL. Discussed adding low-dose xanax prn for anxiety.   Patient is "relieved" that she has discussed with her daughters and feels at peace with decisions.   Outside of room, daughters  ask about prognosis. Explained <6 months with hospice eligibility but possibly less time (months) depending on her functional/cognitive/nutritional status and symptom burden. Provided Hard Choices and Gone from my Sight booklet.   Answered all questions and concerns. PMT contact information given.   Length of Stay: 6  Current Medications: Scheduled Meds:  . acidophilus   Oral Daily  . amLODipine  5 mg Oral Daily  . bisacodyl  5 mg Oral Once  . budesonide (PULMICORT) nebulizer solution  0.25 mg Nebulization BID  . diltiazem  60 mg Oral Q8H  . fluticasone  2 spray Each Nare Daily  . [START ON 12/11/2018] furosemide  40 mg Oral Daily  . guaiFENesin  600 mg Oral BID  . ipratropium-albuterol  3 mL Nebulization Q6H  . losartan  100 mg Oral Daily  . methylPREDNISolone (SOLU-MEDROL) injection  60 mg Intravenous Q8H  . multivitamin with minerals  1 tablet Oral Daily  . senna-docusate  2 tablet Oral BID  . sodium chloride flush  10 mL Intravenous Q12H  . warfarin  2.5 mg Oral ONCE-1800  . Warfarin - Pharmacist Dosing Inpatient   Does not apply q1800    Continuous Infusions: . sodium chloride 500 mL (12/08/18 2337)  . cefTRIAXone (ROCEPHIN)  IV 2 g (12/09/18 1744)    PRN Meds: sodium chloride, acetaminophen **OR** acetaminophen, bisacodyl, cyclobenzaprine, hydrALAZINE, loratadine, morphine CONCENTRATE, ondansetron **OR** ondansetron (ZOFRAN) IV, promethazine  Physical Exam Vitals signs reviewed.  Constitutional:      General: She is awake.     Appearance: She is cachectic. She is ill-appearing.  HENT:     Head: Normocephalic and atraumatic.  Pulmonary:     Effort: No tachypnea, accessory muscle usage or respiratory distress.     Comments: Slight dyspnea at rest relieved by prn Roxanol. Abdominal:     Tenderness: There is no abdominal tenderness.  Skin:    General: Skin is warm and dry.     Coloration: Skin is pale.  Neurological:     Mental Status: She is alert and oriented to  person, place, and time.  Psychiatric:        Mood and Affect: Mood normal.        Speech: Speech normal.        Cognition and Memory: Cognition normal.            Vital Signs: BP (!) 123/48 (BP Location: Right Arm)   Pulse 88   Temp 98.1 F (36.7 C) (Oral)   Resp 20   Ht _0  (1.626 m)   Wt 59 kg   SpO2 (!) 89%   BMI 22.31 kg/m  SpO2: SpO2: (!) 89 % O2 Device: O2 Device: Nasal Cannula O2 Flow Rate: O2 Flow Rate (L/min): 3 L/min  Intake/output summary:   Intake/Output Summary (Last 24 hours) at 12/10/2018 1025 Last data filed at 12/10/2018 0757 Gross per 24 hour  Intake -  Output 2300 ml  Net -2300 ml   LBM: Last BM Date: 12/03/18 Baseline Weight: Weight: 58.7 kg Most recent weight: Weight: (pt. complained back pain, could mot lie flat for weight)       Palliative Assessment/Data: PPS 40%    Flowsheet Rows     Most Recent Value  Intake Tab  Referral Department  Hospitalist  Unit at Time of Referral  Cardiac/Telemetry Unit  Palliative Care Primary Diagnosis  Pulmonary  Date Notified  12/09/18  Palliative Care Type  New Palliative care  Reason for referral  Clarify Goals of Care  Date of Admission  11/08/2018  Date first seen by Palliative Care  12/09/18  # of days Palliative referral response time  0 Day(s)  # of days IP prior to Palliative referral  5  Clinical Assessment  Palliative Performance Scale Score  40%  Psychosocial & Spiritual Assessment  Palliative Care Outcomes  Patient/Family meeting held?  Yes  Who was at the meeting?  patient, two daughters  Palliative Care Outcomes  Clarified goals of care, Provided end of life care assistance, Provided psychosocial or spiritual support, ACP counseling assistance, Improved non-pain symptom therapy, Counseled regarding hospice      Patient Active Problem List   Diagnosis Date Noted  . Palliative  care by specialist   . SOB (shortness of breath) 11/24/2018  . Pneumonia of right middle lobe due to infectious  organism ([place]) 09/08/2018  . Compression fracture of T12 vertebra with routine healing 09/01/2018  . Chronic obstructive pulmonary disease ([place]) 09/01/2018  . Supratherapeutic INR 09/01/2018  . Gait disorder 09/01/2018  . Peripheral edema 09/01/2018  . CAP (community acquired pneumonia) 08/26/2018  . Hyponatremia 08/26/2018  . History of vertebral compression fracture 08/26/2018  . Compression fracture of L3 vertebra ([facility]) 08/20/2018  . Acute bilateral thoracic back pain 05/26/2018  . Cough 11/20/2017  . Abdominal pain 10/15/2017  . Wheezing 08/06/2017  . General weakness 08/02/2016  . Goals of care, counseling/discussion 12/16/2013  . Acute upper respiratory infection 12/16/2013  . Nonspecific abnormal finding in stool contents 10/19/2013  . Benign neoplasm of colon 10/19/2013  . Urinary frequency 08/28/2013  . Hematochezia 05/26/2013  . Chest pain 11/10/2012  . Vertigo 05/28/2012  . Trapezius strain 01/10/2012  . Chronic respiratory failure with hypoxia ([place]) 12/21/2011  . Bronchiectasis ([place]) 12/11/2011  . Fatigue 11/29/2011  . Preventative health care 09/22/2011  . Impaired glucose tolerance 09/21/2011  . Internal hemorrhoids without complication 21/97/5883  . Palpitations 05/21/2011  . Long term (current) use of anticoagulants 01/03/2011  . History of pulmonary embolism 12/08/2010  . FLATULENCE-GAS-BLOATING 07/03/2010  . ABNORMAL FINDINGS GI TRACT 07/03/2010  . WEIGHT LOSS-ABNORMAL 06/06/2010  . NAUSEA 06/06/2010  . ABDOMINAL PAIN-PERIUMBILICAL 25/49/8264  . ABDOMINAL PAIN-EPIGASTRIC 06/06/2010  . ABNORMAL EXAM-BILIARY TRACT 06/06/2010  . DIZZINESS, CHRONIC 10/14/2009  . OVERACTIVE BLADDER 05/26/2009  . CORONARY ARTERY DISEASE 04/06/2009  . COLONIC POLYPS, HX OF 04/06/2009  . Chronic rhinitis 11/22/2008  . Dysuria 05/27/2008  . HEMATOCHEZIA 05/07/2008  . PULMONARY HYPERTENSION, SECONDARY 01/15/2008  . Dyspnea 12/30/2007  . GAIT IMBALANCE 11/26/2007  .  Unspecified hearing loss 10/17/2007  . Hyperlipidemia 08/05/2007  . ANXIETY 08/05/2007  . Depression 08/05/2007  . COMMON MIGRAINE 08/05/2007  . CAROTID ARTERY STENOSIS, RIGHT 08/05/2007  . Allergic rhinitis 08/05/2007  . DIVERTICULOSIS, COLON 08/05/2007  . CYST/PSEUDOCYST, PANCREAS 08/05/2007  . OSTEOARTHRITIS, KNEE, RIGHT 08/05/2007  . Personal History of Other Diseases of Digestive Disease 08/05/2007  . Essential hypertension 03/11/2007  . GERD 03/11/2007  . OSTEOPOROSIS 03/11/2007  . TRANSIENT ISCHEMIC ATTACK, HX OF 03/11/2007    Palliative Care Assessment & Plan   Patient Profile: 85 y.o. 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 Hospital admission for bronchiectasis exacerbation, acute respiratory failure, and severe hyponatremia. Nephrology following. Palliative medicine consultation for goals of care.   Assessment: Acute respiratory failure Acute bronchiectasis exacerbation Hx of suspected MAC infection RML Pulmonary hypertension Severe hyponatremia Hypertensive urgency Depression Anxiety Dyspnea  Recommendations/Plan:  F/u GOC with patient and two daughters present. MOST form completed. Patient wishes include DNR/DNI, comfort focused care if further decline, IVF/ABX for time trial, and NO feeding tube. Durable DNR completed. Copies made for chart and daughters.   Symptom management  Roxanol 61m PO q3h prn pain/dyspnea  Xanax 0.251mPO TID prn anxiety  Senna scheduled  Dulcolax prn daily  SW following with help for disposition. Patient needs higher level of care. Daughters unable to care for her at home with hospice services. Likely pursue SNF for rehab with palliative f/u. Suspect transition to hospice services  in the  near future.   PMT will follow inpatient.   Code Status: DNR/DNI   Code Status Orders  (From admission, onward)         Start     Ordered   11/20/2018 1723  Full code  Continuous     11/10/2018 1722        Code Status History    Date Active Date Inactive Code Status Order ID Comments User Context   08/26/2018 2202 08/28/2018 1651 Full Code [id number]  Vianne Bulls, MD ED    Advance Directive Documentation     Most Recent Value  Type of Advance Directive  Healthcare Power of Attorney  Pre-existing out of facility DNR order (yellow form or pink MOST form)  -  "MOST" Form in Place?  -       Prognosis:  Poor long-term prognosis with acute on chronic respiratory failure 2/2 COPD, bronchiectasis exacerbation, hx of suspected RML MAC infection, pulmonary hypertension, declining functional/nutritional status.   Discharge Planning:  To Be Determined  Care plan was discussed with patient, daughters [name] and [name]), RN, Dr. Manuella Ghazi, SW  Thank you for allowing the Palliative Medicine Team to assist in the care of this patient.   Time In: 1000 Time Out: 1120 Total Time 80 Prolonged Time Billed  yes       Greater than 50%  of this time was spent counseling and coordinating care related to the above assessment and plan.  Ihor Dow, FNP-C Palliative Medicine Team  Phone: [phone] Fax: [phone]  Please contact Palliative Medicine Team phone at [phone] for questions and concerns.

## 2018-12-10 NOTE — Clinical Social Work Note (Signed)
[name] spoke to patient and her family in regards to SNF placement for rehab verse long term care or hospice to follow.  [name].  [name] explained that if patient goes to SNF under hospice services, then the family will have to pay privately for room and board.  [name] informed them that if patient goes to SNF under Medicare rehab benefits, therapy can work with her and see how she does.  [name] informed family that if patient is not able to participate enough with therapy then insurance will stop paying for her and they will have to transition to either long term care under private pay, until her Long term care policy becomes effective or discharge home with home health.  Patient's daughter expressed that she is the only one available, and she does not think she will be able to take care of patient on her own.  [name] informed her that there are private pay care agencies that assist at the home if needed.  Patient's family asked about the hospice home, and [name] informed them that in order to qualify, she will have to be reviewed by hospice home to see if she is appropriate per palliative they do not feel patient is hospice home appropriate yet.  [name] discussed case with physician, palliative care team, and case manager, [name] to continue to follow patient's progress throughout discharge planning.  Jones Broom. [place], MSW, [place]  12/10/2018 6:42 PM

## 2018-12-11 LAB — BASIC METABOLIC PANEL
Anion gap: 7 (ref 5–15)
BUN: 34 mg/dL — ABNORMAL HIGH (ref 8–23)
CO2: 28 mmol/L (ref 22–32)
Calcium: 9 mg/dL (ref 8.9–10.3)
Chloride: 96 mmol/L — ABNORMAL LOW (ref 98–111)
Creatinine, Ser: 0.61 mg/dL (ref 0.44–1.00)
GFR calc Af Amer: >60 mL/min (ref >60)
GFR calc non Af Amer: >60 mL/min (ref >60)
Glucose, Bld: 170 mg/dL — ABNORMAL HIGH (ref 70–99)
Potassium: 4.7 mmol/L (ref 3.5–5.1)
Sodium: 131 mmol/L — ABNORMAL LOW (ref 135–145)

## 2018-12-11 LAB — CBC
HCT: 32.5 % — ABNORMAL LOW (ref 36.0–46.0)
Hemoglobin: 11.8 g/dL — ABNORMAL LOW (ref 12.0–15.0)
MCH: 35.5 pg — ABNORMAL HIGH (ref 26.0–34.0)
MCHC: 36.3 g/dL — ABNORMAL HIGH (ref 30.0–36.0)
MCV: 97.9 fL (ref 80.0–100.0)
Platelets: 242 10*3/uL (ref 150–400)
RBC: 3.32 MIL/uL — ABNORMAL LOW (ref 3.87–5.11)
RDW: 14.6 % (ref 11.5–15.5)
WBC: 20.5 10*3/uL — ABNORMAL HIGH (ref 4.0–10.5)
nRBC: 0 % (ref 0.0–0.2)

## 2018-12-11 LAB — PROTIME-INR
INR: 3.1 — ABNORMAL HIGH (ref 0.8–1.2)
Prothrombin Time: 31.6 seconds — ABNORMAL HIGH (ref 11.4–15.2)

## 2018-12-11 MED ORDER — BISACODYL 10 MG RE SUPP: 10.0000 mg | Freq: Every day | RECTAL | Status: DC | PRN

## 2018-12-11 MED ORDER — GLYCOPYRROLATE 0.2 MG/ML IJ SOLN
0.2000 mg | INTRAMUSCULAR | Status: DC | PRN
  Filled 2018-12-11: qty 1

## 2018-12-11 MED ORDER — MORPHINE SULFATE (CONCENTRATE) 10 MG/0.5ML PO SOLN
5.0000 mg | ORAL | Status: DC | PRN
  Administered 2018-12-11 – 2018-12-12 (×3): 5 mg via SUBLINGUAL
  Filled 2018-12-11 (×3): qty 1

## 2018-12-11 MED ORDER — LORAZEPAM 2 MG/ML IJ SOLN
0.5000 mg | INTRAMUSCULAR | Status: DC | PRN
  Administered 2018-12-11: 0.5 mg via INTRAVENOUS
  Filled 2018-12-11: qty 1

## 2018-12-11 MED ORDER — IPRATROPIUM-ALBUTEROL 0.5-2.5 (3) MG/3ML IN SOLN: 3.0000 mL | Freq: Four times a day (QID) | RESPIRATORY_TRACT | Status: DC | PRN

## 2018-12-11 MED ORDER — MORPHINE SULFATE (PF) 2 MG/ML IV SOLN: 1.0000 mg | INTRAVENOUS | Status: DC | PRN

## 2018-12-11 NOTE — Progress Notes (Signed)
F/u with patient and daughters at bedside. Answered questions and concerns regarding plan of care and symptom management medications. Daughters requesting to take off NRB mask. Recently given SL morphine.Transitioned from NRB to [place] 6L with no s/s of distress or discomfort. Oxygen saturation checked per daughters request. Maintaining at 89-91% on 6L . Explained focus of looking for verbal or non-verbal s/s of discomfort versus focusing on numbers. Educated on EOL expectations and daughters have Gone from my Sight booklets. Daughters would appreciate visit from chaplain again. Consult placed. Emotional/spiritual support provided. Daughters have PMT contact information.   NO CHARGE  Ihor Dow, [place], FNP-C Palliative Medicine Team  Phone: [phone] Fax: [phone]

## 2018-12-11 NOTE — Progress Notes (Signed)
[place] [facility] [place] NAME: [name]    MR#:  [id number]  DATE OF BIRTH:  Feb 03, 1930  SUBJECTIVE:  CHIEF COMPLAINT:   Chief Complaint  Patient presents with  . Shortness of Breath  . Cough   Decline overall. Requiring NRB mask. Daughters at bedside. COMFORT MEASURES ONLY.  Anticipate hospice if bed becomes available, per [facility] hospice representative there are no beds today. Anticipate possible hospital death.   REVIEW OF SYSTEMS:  ROS Complete ROS deferred at patient and family request. Patient is not very conversant. Weak. Short of breath.  DRUG ALLERGIES:   Allergies  Allergen Reactions  . Raloxifene Other (See Comments)    REACTION: risk of recurrent stroke  . Sulfonamide Derivatives Anaphylaxis    REACTION: tongue swells  . Amoxicillin-Pot Clavulanate Diarrhea    REACTION: diarrhea Has patient had a PCN reaction causing immediate rash, facial/tongue/throat swelling, SOB or lightheadedness with hypotension: unknown Has patient had a PCN reaction causing severe rash involving mucus membranes or skin necrosis: unknown Has patient had a PCN reaction that required hospitalization : unknown Has patient had a PCN reaction occurring within the last 10 years: yes   pt states she should be able to take penicillin, but cant remember actually taking it   . C[place]onide Other (See Comments)    REACTION: bad smell  . Ciprofloxacin Other (See Comments)    dizziness   VITALS:  Blood pressure (!) 139/49, pulse (!) 104, temperature (!) 97.5 F (36.4 C), temperature source Oral, resp. rate 20, height 5\' 4"  (1.626 m), weight 59 kg, SpO2 (!) 85 %. PHYSICAL EXAMINATION:  Physical Exam Constitutional: She isoriented to person, place, and time. She is ill-appearing, cachectic, in and out of sleep. HENT:  Head:Normocephalicand atraumatic.  Right Ear: External earnormal.  Mouth/Throat:Oropharynx is clear and moist.  Eyes:Pupils are  equal, round, and reactive to light. Neck:Normal range of motion.Neck supple.No tracheal deviationpresent.  Cardiovascular: Positive for edema. Respiratory:There is increased work of breathing with conversation + dyspnea. Breathing is shallow. She is wearing a non-rebreather mask. GI:She exhibitsno distension. There isno abdominal tenderness.  Musculoskeletal:Tender to palpation at left anterior lower extremity.Normal range of motion.  Neurological: She is in and out of sleep and oriented to person, place, and time. Full neuro exam deferred. Skin:Scattered ecchymoses.Skin iswarm. She isnot diaphoretic. Noerythema.  Psychiatric: She is minorly conversant. Drowsy.   LABORATORY PANEL:  Female CBC Recent Labs  Lab 12/11/18 0433  WBC 20.5*  HGB 11.8*  HCT 32.5*  PLT 242   ------------------------------------------------------------------------------------------------------------------ Chemistries  Recent Labs  Lab 12/06/18 0404  12/09/18 0411  12/11/18 0433  NA 118*   < > 126*   < > 131*  K 4.3   < > 4.1   < > 4.7  CL 89*   < > 93*   < > 96*  CO2 23   < > 26   < > 28  GLUCOSE 162*   < > 167*   < > 170*  BUN 18   < > 34*   < > 34*  CREATININE 0.49   < > 0.47   < > 0.61  CALCIUM 8.4*   < > 8.3*   < > 9.0  MG 1.7  --   --   --   --   AST  --   --  30  --   --   ALT  --   --  52*  --   --  ALKPHOS  --   --  68  --   --   BILITOT  --   --  0.7  --   --    < > = values in this interval not displayed.   RADIOLOGY:  No results found. ASSESSMENT AND PLAN:   Tabitha Geng Schillois a 83 year oldfemalewithhistory ofCVA, PE, hypertension, hyperlipidemia, GERD, coronary artery disease, COPD, depression, who was admitted to [facility] on2/27/2020for Bronchiectasis with acute exacerbation, hyponatremia, and generalized fatigue.  As of 16-Dec-2018 we are pursuing Hospice with [facility] while awaiting a bed following family discussions with palliative care and  given the patient's declining health. Anticipate a possible hospital death.    Symptom management (per Palliative care recommendations) ? Roxanol 5mg  PO q2h prn pain/dyspnea ? Morphine 1-2mg  IV q2h prn severe pain/severe dyspnea ? Ativan 0.5mg  IV q4h prn anxiety ? Robinul 0.2mg  IV q4h prn secretions RN to transition off NRB back to nasal cannula when daughters ready. Family requested leaving cardiac monitor on to anticipate her passing. Left.  The following is included for documentation purposes:  Plan:  1.Acute respiratory failureacute bronchiectasis with iatrogenic fluid overload. Questioned acute pneumonia vs. Aspiration. Continued nebulizer treatments. Antibiotics.Solumedrol. -CXR portable 12/07/2018 repeat - shows new consolidation vs. Atelectasis, right pleural effusion - CXR 2-view 12/08/2018 -confluent right lung base opacity -Diuresis with lasix per nephrology WBC elevated to20.5today. Patient was on steroids. Shewason doxycycline.Sputumculturegrowing GNR.Procalcitonin less than 0.1.Switched to [employer] recommendations3/12/2018. Morphine PRN to alleviate discomfort and aid breathing. Xanax.  2.Severe hyponatremiawith initial sodium of 112 Likely due tounderlying pulmonary process No seizures. Had some mild confusion which is reported to be improved. Most recent sodium 131. Has not been stable. -Nephrology/Dr. Juleen China following:discontinuedhypertonic saline, serial sodium and neuro checks. Hold sertraline.Discontinued normal saline.  Restarted diuresis with Lasix 12/10/2018. Trial of Tolvaptan. PO intake.  Serum osmolality of 245. Urine sodium of 21. Urine osmolality of 277.  [id number] cortisol level 12.0 Comfort measures only.   3. Hypertensive urgencyon presentation  Blood pressure appeared better controlled Continued amlodipine, diltiazem, hydralazine, losartan  4.Sinus tachycardia due to underlying lung  disease  5.Depression, anxietyheld sertralinefor hyponatremia, xanax PRN  6.History of PEcontinued Coumadin INR2.7today.Goal 2-3.Pharmacy followed  7. Deconditioning, multiple comorbiditiesseen by OT and PT. Recommended SNF. LCSW followed. Palliative care following, patient and familyagreed onDNR/DNI, outpatient palliative care at minimum. The decision was made to proceed with comfort measures only and hospice as of 12/16/2018.  DVT prophylaxis;patient was already on Coumadin.   All the records are reviewed and case is discussed with Care Management/Social Worker. Management plans discussed with the patient and/or family and they are in agreement.  CODE STATUS: DNR  TOTAL TIME TAKING CARE OF THIS PATIENT: 30 minutes.   More than 50% of the time was spent in counseling/coordination of care: YES  POSSIBLE D/C IN 1 DAYS, DEPENDING ON CLINICAL CONDITION, vs. Anticipated hospital death.  Coyt Govoni PA-C on 2018-12-16 at 11:05 AM  Between 7am to 6pm - Pager - [phone]  After 6 pm go to [email or web address] - Proofreader  [facility]  Office  [phone]  CC: Primary care physician; [name], MD  Note: This dictation was prepared with Dragon dictation along with smaller phrase technology. Any transcriptional errors that result from this process are unintentional.

## 2018-12-11 NOTE — Progress Notes (Signed)
   12/11/18 1700  Clinical Encounter Type  Visited With Patient and family together  Visit Type Follow-up  Referral From Nurse  Spiritual Encounters  Spiritual Needs Prayer  Pt was very weak but alert. Pt was with two daughters one of which requested a prayer. Ch prayed with them.

## 2018-12-11 NOTE — Progress Notes (Signed)
[facility]  ROUNDING NOTE   Subjective:   Patient wants to go home with comfort care.  Na 131  Objective:  Vital signs in last 24 hours:  Temp:  [98 F (36.7 C)-98.4 F (36.9 C)] 98.4 F (36.9 C) (03/04 1910) Pulse Rate:  [88-103] 100 (03/04 1910) Resp:  [20] 20 (03/04 1910) BP: (122-162)/(42-50) 162/50 (03/04 1910) SpO2:  [87 %-93 %] 90 % (03/04 2025)  Weight change:  Filed Weights   12/02/2018 1038 11/11/2018 2252 12/06/18 0408  Weight: 58.7 kg 57.3 kg 59 kg    Intake/Output: I/O last 3 completed shifts: In: -  Out: 2500 [Urine:2500]   Intake/Output this shift:  No intake/output data recorded.  Physical Exam: General: NAD,   Head: Normocephalic, atraumatic. Moist oral mucosal membranes  Eyes: Anicteric, PERRL  Neck: Supple, trachea midline  Lungs:  Clear bilaterally  Heart: Regular rate and rhythm  Abdomen:  Soft, nontender,   Extremities:  + peripheral edema.  Neurologic: Nonfocal, moving all four extremities  Skin: No lesions        Basic Metabolic Panel: Recent Labs  Lab 12/06/18 0404  12/08/18 0446  12/09/18 0411 12/09/18 3790  12/09/18 2215 12/10/18 0018 12/10/18 0402 12/10/18 2225 12/11/18 0433  NA 118*   < > 127*   < > 126* 126*   < > 130* 129* 131* 132* 131*  K 4.3   < > 4.3  --  4.1 4.4  --   --   --  4.7  --  4.7  CL 89*   < > 95*  --  93* 93*  --   --   --  98  --  96*  CO2 23   < > 25  --  26 26  --   --   --  27  --  28  GLUCOSE 162*   < > 150*  --  167* 177*  --   --   --  191*  --  170*  BUN 18   < > 28*  --  34* 33*  --   --   --  27*  --  34*  CREATININE 0.49   < > 0.46  --  0.47 0.48  --   --   --  0.40*  --  0.61  CALCIUM 8.4*   < > 8.7*  --  8.3* 9.0  --   --   --  9.0  --  9.0  MG 1.7  --   --   --   --   --   --   --   --   --   --   --   PHOS 1.9*  --   --   --   --   --   --   --   --   --   --   --    < > = values in this interval not displayed.    Liver Function Tests: Recent Labs  Lab 11/28/2018 1131  12/09/18 0411  AST 32 30  ALT 25 52*  ALKPHOS 99 69  BILITOT 0.4 0.7  PROT 7.5 5.3*  ALBUMIN 3.7 2.3*   No results for input(s): LIPASE, AMYLASE in the last 168 hours. No results for input(s): AMMONIA in the last 168 hours.  CBC: Recent Labs  Lab 11/11/2018 1131  12/07/18 0434 12/08/18 0446 12/09/18 0411 12/10/18 0402 12/11/18 0433  WBC 10.0   < > 18.0* 19.3* 21.6* 23.4*  20.5*  NEUTROABS 8.5*  --   --   --   --   --   --   HGB 14.4   < > 12.8 13.1 12.2 12.0 11.8*  HCT 40.3   < > 34.5* 37.4 35.3* 33.3* 32.5*  MCV 87.2   < > 94.5 94.4 90.7 95.4 97.9  PLT 228   < > 221 241 225 228 242   < > = values in this interval not displayed.    Cardiac Enzymes: No results for input(s): CKTOTAL, CKMB, CKMBINDEX, TROPONINI in the last 168 hours.  BNP: Invalid input(s): POCBNP  CBG: Recent Labs  Lab 12/09/18 2130  GLUCAP 184*    Microbiology: Results for orders placed or performed during the hospital encounter of 11/20/2018  Culture, blood (Routine x 2)     Status: None   Collection Time: 11/19/2018 11:31 AM  Result Value Ref Range Status   Specimen Description BLOOD BLOOD RIGHT ARM  Final   Special Requests   Final    BOTTLES DRAWN AEROBIC AND ANAEROBIC Blood Culture adequate volume   Culture   Final    NO GROWTH 5 DAYS Performed at [facility], [address] [place], [place] [address]    Report Status 12/09/2018 FINAL  Final  Culture, blood (Routine x 2)     Status: None   Collection Time: 12/03/2018 11:31 AM  Result Value Ref Range Status   Specimen Description BLOOD LEFT ANTECUBITAL  Final   Special Requests   Final    BOTTLES DRAWN AEROBIC AND ANAEROBIC Blood Culture results may not be optimal due to an excessive volume of blood received in culture bottles   Culture   Final    NO GROWTH 5 DAYS Performed at [facility], [address] [place], [place] [address]    Report Status 12/09/2018 FINAL  Final  Culture, sputum-assessment     Status: None    Collection Time: 11/10/2018  5:23 PM  Result Value Ref Range Status   Specimen Description SPUTUM  Final   Special Requests NONE  Final   Sputum evaluation   Final    THIS SPECIMEN IS ACCEPTABLE FOR SPUTUM CULTURE Performed at [facility], [address], [place], [place] [address]    Report Status 12/06/2018 FINAL  Final  Culture, respiratory     Status: None   Collection Time: 11/23/2018  5:23 PM  Result Value Ref Range Status   Specimen Description   Final    SPUTUM Performed at [facility], [address], [place], [place] [address]    Special Requests   Final    NONE Reflexed from [id number] Performed at [facility], [address] [place], [place] [address]    Gram Stain   Final    FEW WBC PRESENT, PREDOMINANTLY PMN FEW GRAM NEGATIVE RODS FEW YEAST FEW GRAM POSITIVE COCCI IN CLUSTERS    Culture   Final    Consistent with normal respiratory flora. Performed at [facility], [place] [address], [place], [place] [address]    Report Status 12/10/2018 FINAL  Final    Coagulation Studies: Recent Labs    12/09/18 0411 12/10/18 0402 12/11/18 0433  LABPROT 22.2* 28.6* 31.6*  INR 2.0* 2.7* 3.1*    Urinalysis: No results for input(s): COLORURINE, LABSPEC, PHURINE, GLUCOSEU, HGBUR, BILIRUBINUR, KETONESUR, PROTEINUR, UROBILINOGEN, NITRITE, LEUKOCYTESUR in the last 72 hours.  Invalid input(s): APPERANCEUR    Imaging: No results found.   Medications:   .  sodium chloride 500 mL (12/08/18 2337)  . cefTRIAXone (ROCEPHIN)  IV 2 g (12/10/18 1547)   . acidophilus   Oral Daily  . amLODipine  5 mg Oral Daily  . budesonide (PULMICORT) nebulizer solution  0.25 mg Nebulization BID  . diltiazem  60 mg Oral Q8H  . fluticasone  2 spray Each Nare Daily  . furosemide  40 mg Oral Daily  . guaiFENesin  600 mg Oral BID  . ipratropium-albuterol  3 mL Nebulization Q6H  . losartan  100 mg Oral Daily  . methylPREDNISolone (SOLU-MEDROL) injection  60  mg Intravenous Q8H  . multivitamin with minerals  1 tablet Oral Daily  . senna-docusate  2 tablet Oral BID  . sodium chloride flush  10 mL Intravenous Q12H  . Warfarin - Pharmacist Dosing Inpatient   Does not apply q1800   sodium chloride, acetaminophen **OR** acetaminophen, ALPRAZolam, bisacodyl, cyclobenzaprine, hydrALAZINE, loratadine, morphine CONCENTRATE, ondansetron **OR** ondansetron (ZOFRAN) IV, polyethylene glycol, promethazine  Assessment/ Plan:  Ms. [name] is a 85 y.o. white female  with CVA, PE, hypertension, hyperlipidemia, GERD, coronary artery disease, COPD, depression, who was admitted to [facility] on 11/08/2018 for Bronchiectasis with acute exacerbation and Hyponatremia   1. Hyponatremia: acute on chronic. Baseline of 131 on 09/01/18 Acute hyponatremia secondary to hypovolemia and pulmonary issues. - Hold sertraline - status post PO tolvaptan 3/3 - Fluid restirction - Continue furosemide   2. Hypertension: urgency on admission.  - losartan, hydralazine, amlodipine, diltiazem.   3. Acute respiratory failure: secondary to acute bronchiectasis - doxycycline - solumedrol.  - O2.   4. History of pulmonary embolism - warfarin  5. Depression: patient was not taking sertraline at home. Continue to hold due to hyponatremia.   6. Hypokalemia - PO potassium chloride.    LOS: 7 Marcelis Wissner 3/5/20206:59 AM

## 2018-12-11 NOTE — Care Management Important Message (Signed)
Copy of signed Medicare IM left with patient in room. 

## 2018-12-11 NOTE — Progress Notes (Signed)
Family Meeting Note  Advance Directive:yes  Today a meeting took place with the Patient and Daughter at bedside.  The following clinical team members were present during this meeting:MD  The following were discussed:Patient's diagnosis: 85 y.o. 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  Clinically getting worse and seem to be actively dying.  Past Medical History:  Diagnosis Date  . ALLERGIC RHINITIS   . ANXIETY   . Asthma   . Bronchiectasis 12/11/2011   CT chest dx  . CAROTID ARTERY STENOSIS, RIGHT   . Chronic idiopathic constipation   . Chronic rhinitis   . COMMON MIGRAINE   . COPD   . CORONARY ARTERY DISEASE   . CYST/PSEUDOCYST, PANCREAS   . DEPRESSION   . DIVERTICULOSIS, COLON   . DIZZINESS, CHRONIC   . Dyspnea    on exertion  . Esophageal stricture   . GERD   . HYPERLIPIDEMIA   . HYPERTENSION   . Internal hemorrhoids with complication   . OSTEOPOROSIS   . OVERACTIVE BLADDER   . Pulmonary embolism ([place]) 2008  . PULMONARY HYPERTENSION, SECONDARY   . Stroke ([place])   . Tubular adenoma of colon 10/2013  . Unspecified hearing loss     Patient's progosis: < 2 weeks and Goals for treatment: DNR, comfort care  Additional follow-up to be provided: Palliative care following. Hospice home when bed available  Time spent during discussion:16 mins  Max Sane, MD

## 2018-12-11 NOTE — Progress Notes (Signed)
Daily Progress Note   Patient Name: [name]       Date: 12/11/2018 DOB: [birth year]  Age: 85 y.o. MRN#: [id number] Attending Physician: Max Sane, MD Primary Care Physician: [name], MD Admit Date: 11/10/2018  Reason for Consultation/Follow-up: Establishing goals of care  Subjective:  Patient somnolent but resting comfortably. No s/s of distress. NRB mask placed this morning for hypoxia on nasal cannula. Shallow, regular respiratory pattern.    GOC:   F/u GOC with two daughters Lovey Newcomer and [place]) at bedside.   Discussed change in clinical condition including worsening respiratory status requiring NRB mask. Discussed poor prognosis.   Daughters relieved that discussions regarding code status were clarified and that their mother made a decision for DNR/DNI. They share that these were her wishes previously. They share she is at peace with these decisions and EOL. They speak of a surge of energy last night when she was requesting various foods for dinner.   Discussed transition to comfort measures only in order to allow peace and dignity at EOL. Discussed EOL expectations and symptom management medications to ensure relief from suffering. Discussed hospice home eligibility and that interventions not aimed at comfort will be discontinued. Daughters confirm understanding. Daughters request to keep cardiac monitor in place so nursing knows if she passes. Explained that when daughters are ready, they can ask RN to remove NRB and place back on [place].   Spiritual individual. Daughters would appreciate a visit from chaplain.   Therapeutic listening and emotional/spiritual support provided.   Length of Stay: 7  Current Medications: Scheduled Meds:  . sodium chloride flush  10 mL  Intravenous Q12H    Continuous Infusions: . sodium chloride 500 mL (12/08/18 2337)    PRN Meds: sodium chloride, acetaminophen **OR** acetaminophen, bisacodyl, cyclobenzaprine, glycopyrrolate, hydrALAZINE, ipratropium-albuterol, LORazepam, morphine injection, morphine CONCENTRATE, ondansetron **OR** ondansetron (ZOFRAN) IV, promethazine  Physical Exam Vitals signs reviewed.  Constitutional:      Appearance: She is cachectic. She is ill-appearing.  HENT:     Head: Normocephalic and atraumatic.  Pulmonary:     Effort: No tachypnea, accessory muscle usage or respiratory distress.  Abdominal:     Tenderness: There is no abdominal tenderness.  Skin:    General: Skin is warm and dry.     Coloration: Skin is pale.  Neurological:  Mental Status: She is easily aroused.     Comments: drowsy  Psychiatric:        Attention and Perception: She is inattentive.        Speech: Speech is delayed.            Vital Signs: BP (!) 139/49 (BP Location: Right Arm)   Pulse (!) 104   Temp (!) 97.5 F (36.4 C) (Oral)   Resp 20   Ht _0  (1.626 m)   Wt 59 kg   SpO2 (!) 85%   BMI 22.31 kg/m  SpO2: SpO2: (!) 85 % O2 Device: O2 Device: NRB O2 Flow Rate: O2 Flow Rate (L/min): 15 L/min  Intake/output summary:   Intake/Output Summary (Last 24 hours) at 12/11/2018 1122 Last data filed at 12/10/2018 1853 Gross per 24 hour  Intake -  Output 200 ml  Net -200 ml   LBM: Last BM Date: 12/03/18 Baseline Weight: Weight: 58.7 kg Most recent weight: Weight: (pt. complained back pain, could mot lie flat for weight)       Palliative Assessment/Data: PPS 20%    Flowsheet Rows     Most Recent Value  Intake Tab  Referral Department  Hospitalist  Unit at Time of Referral  Cardiac/Telemetry Unit  Palliative Care Primary Diagnosis  Pulmonary  Date Notified  12/09/18  Palliative Care Type  New Palliative care  Reason for referral  Clarify Goals of Care  Date of Admission  11/18/2018  Date first  seen by Palliative Care  12/09/18  # of days Palliative referral response time  0 Day(s)  # of days IP prior to Palliative referral  5  Clinical Assessment  Palliative Performance Scale Score  20%  Psychosocial & Spiritual Assessment  Palliative Care Outcomes  Patient/Family meeting held?  Yes  Who was at the meeting?  daughters  Palliative Care Outcomes  Clarified goals of care, Counseled regarding hospice, Provided end of life care assistance, Improved pain interventions, Improved non-pain symptom therapy, Provided psychosocial or spiritual support, Changed to focus on comfort      Patient Active Problem List   Diagnosis Date Noted  . Palliative care by specialist   . DNR (do not resuscitate)   . SOB (shortness of breath) 11/26/2018  . Pneumonia of right middle lobe due to infectious organism ([place]) 09/08/2018  . Compression fracture of T12 vertebra with routine healing 09/01/2018  . Chronic obstructive pulmonary disease ([place]) 09/01/2018  . Supratherapeutic INR 09/01/2018  . Gait disorder 09/01/2018  . Peripheral edema 09/01/2018  . CAP (community acquired pneumonia) 08/26/2018  . Hyponatremia 08/26/2018  . History of vertebral compression fracture 08/26/2018  . Compression fracture of L3 vertebra ([facility]) 08/20/2018  . Acute bilateral thoracic back pain 05/26/2018  . Cough 11/20/2017  . Abdominal pain 10/15/2017  . Wheezing 08/06/2017  . General weakness 08/02/2016  . Goals of care, counseling/discussion 12/16/2013  . Acute upper respiratory infection 12/16/2013  . Nonspecific abnormal finding in stool contents 10/19/2013  . Benign neoplasm of colon 10/19/2013  . Urinary frequency 08/28/2013  . Hematochezia 05/26/2013  . Chest pain 11/10/2012  . Vertigo 05/28/2012  . Trapezius strain 01/10/2012  . Chronic respiratory failure with hypoxia ([place]) 12/21/2011  . Bronchiectasis ([place]) 12/11/2011  . Fatigue 11/29/2011  . Preventative health care 09/22/2011  . Impaired glucose  tolerance 09/21/2011  . Internal hemorrhoids without complication 29/52/8413  . Palpitations 05/21/2011  . Long term (current) use of anticoagulants 01/03/2011  . History of pulmonary embolism 12/08/2010  .  FLATULENCE-GAS-BLOATING 07/03/2010  . ABNORMAL FINDINGS GI TRACT 07/03/2010  . WEIGHT LOSS-ABNORMAL 06/06/2010  . NAUSEA 06/06/2010  . ABDOMINAL PAIN-PERIUMBILICAL 81/19/1478  . ABDOMINAL PAIN-EPIGASTRIC 06/06/2010  . ABNORMAL EXAM-BILIARY TRACT 06/06/2010  . DIZZINESS, CHRONIC 10/14/2009  . OVERACTIVE BLADDER 05/26/2009  . CORONARY ARTERY DISEASE 04/06/2009  . COLONIC POLYPS, HX OF 04/06/2009  . Chronic rhinitis 11/22/2008  . Dysuria 05/27/2008  . HEMATOCHEZIA 05/07/2008  . PULMONARY HYPERTENSION, SECONDARY 01/15/2008  . Dyspnea 12/30/2007  . GAIT IMBALANCE 11/26/2007  . Unspecified hearing loss 10/17/2007  . Hyperlipidemia 08/05/2007  . Anxiety state 08/05/2007  . Depression 08/05/2007  . COMMON MIGRAINE 08/05/2007  . CAROTID ARTERY STENOSIS, RIGHT 08/05/2007  . Allergic rhinitis 08/05/2007  . DIVERTICULOSIS, COLON 08/05/2007  . CYST/PSEUDOCYST, PANCREAS 08/05/2007  . OSTEOARTHRITIS, KNEE, RIGHT 08/05/2007  . Personal History of Other Diseases of Digestive Disease 08/05/2007  . Essential hypertension 03/11/2007  . GERD 03/11/2007  . OSTEOPOROSIS 03/11/2007  . TRANSIENT ISCHEMIC ATTACK, HX OF 03/11/2007    Palliative Care Assessment & Plan   Patient Profile: 85 y.o. 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 Hospital admission for bronchiectasis exacerbation, acute respiratory failure, and severe hyponatremia. Nephrology following. Palliative medicine consultation for goals of care.   Assessment: Acute respiratory  failure Acute bronchiectasis exacerbation Hx of suspected MAC infection RML Pulmonary hypertension Severe hyponatremia Hypertensive urgency Depression Anxiety Dyspnea  Recommendations/Plan:  MOST form completed with patient and daughters 12/10/18. Patient wishes include DNR/DNI, comfort focused care if further decline, IVF/ABX for time trial, and NO feeding tube. Durable DNR completed. Copies made for chart and daughters.   Clinical decline this AM requiring NRB mask. Daughters wish to transition to comfort measures only and pursue hospice facility if appropriate. Daughters understand interventions not aimed at comfort will be discontinued.   Symptom management  Roxanol 49m PO q2h prn pain/dyspnea  Morphine 1-210mIV q2h prn severe pain/severe dyspnea  Ativan 0.24m73mV q4h prn anxiety  Robinul 0.2mg27m q4h prn secretions  Daughters request to continue cardiac monitor. RN notified.   RN to transition off NRB back to nasal cannula when daughters ready.   Comfort feeds per patient/family request.  PMT will follow. Possible hospital death.   Code Status: DNR/DNI   Code Status Orders  (From admission, onward)         Start     Ordered   12/02/2018 1723  Full code  Continuous     11/28/2018 1722        Code Status History    Date Active Date Inactive Code Status Order ID Comments User Context   08/26/2018 2202 08/28/2018 1651 Full Code [id number] Bulls ED    Advance Directive Documentation     Most Recent Value  Type of Advance Directive  Healthcare Power of Attorney  Pre-existing out of facility DNR order (yellow form or pink MOST form)  -  "MOST" Form in Place?  -       Prognosis:   Poor prognosis with acute on chronic respiratory failure 2/2 COPD, bronchiectasis exacerbation, hx of suspected RML MAC infection, pulmonary hypertension, declining functional/nutritional status.   Discharge Planning:  To Be Determined: hospital death vs. Hospice home  transfer  Care plan was discussed with daughters ([place] Newcomer LoriCecille RubinN, Dr. ShahManuella Ghazi  Thank you for allowing the Palliative Medicine Team to assist in the care of this patient.   Time In: 1030 Time Out: 1110 Total Time 40 Prolonged Time Billed  no      Greater than 50%  of this time was spent counseling and coordinating care related to the above assessment and plan.  Ihor Dow, FNP-C Palliative Medicine Team  Phone: [phone] Fax: [phone]  Please contact Palliative Medicine Team phone at [phone] for questions and concerns.

## 2018-12-11 NOTE — Progress Notes (Signed)
PT Cancellation Note  Patient Details Name: [name] MRN: [id number] DOB: [birth year]   Cancelled Treatment:    Reason Eval/Treat Not Completed: Other (comment).  PT order cancelled by Palliative Care NP (pt transitioned to comfort care).  Leitha Bleak, PT 12/11/18, 11:34 AM [phone]

## 2018-12-11 NOTE — Clinical Social Work Note (Signed)
[name] received referral for patient's family wanting hospice facility.  [name] contacted patient's daughter Katharine Look [phone], and provided choice for hospice homes, they would like to go to [facility]  [name] made referral to [place].  Jones Broom. Norval Morton, MSW, LCSW [phone]  12/11/2018 6:27 PM

## 2018-12-11 NOTE — Consult Note (Signed)
ANTICOAGULATION CONSULT NOTE - Follow up [place] for Warfarin Dosing Indication: pulmonary embolus  Allergies  Allergen Reactions  . Raloxifene Other (See Comments)    REACTION: risk of recurrent stroke  . Sulfonamide Derivatives Anaphylaxis    REACTION: tongue swells  . Amoxicillin-Pot Clavulanate Diarrhea    REACTION: diarrhea Has patient had a PCN reaction causing immediate rash, facial/tongue/throat swelling, SOB or lightheadedness with hypotension: unknown Has patient had a PCN reaction causing severe rash involving mucus membranes or skin necrosis: unknown Has patient had a PCN reaction that required hospitalization : unknown Has patient had a PCN reaction occurring within the last 10 years: yes   pt states she should be able to take penicillin, but cant remember actually taking it   . Ciclesonide Other (See Comments)    REACTION: bad smell  . Ciprofloxacin Other (See Comments)    dizziness   Patient Measurements: Height: 5\' 4"  (162.6 cm) Weight: (pt. complained back pain, could mot lie flat for weight) IBW/kg (Calculated) : 54.7  Vital Signs: Temp: 97.8 F (36.6 C) (03/05 0709) Temp Source: Oral (03/05 0709) BP: 140/55 (03/05 0709) Pulse Rate: 96 (03/05 0709)  Labs: Recent Labs    12/09/18 0411 12/09/18 0903 12/10/18 0402 12/11/18 0433  HGB 12.2  --  12.0 11.8*  HCT 35.3*  --  33.3* 32.5*  PLT 225  --  228 242  LABPROT 22.2*  --  28.6* 31.6*  INR 2.0*  --  2.7* 3.1*  CREATININE 0.47 0.48 0.40* 0.61    Estimated Creatinine Clearance: 43.6 mL/min (by C-G formula based on SCr of 0.61 mg/dL).   Assessment: Pharmacy consulted for warfarin dosing and monitor in 85 yo female with PMH of PE. Patient is being admitted with acute respiratory failure secondary to acute bronchiectasis and is receiving Doxycycline BID. INR on admission if supratherapeutic  @ 3.5. Last dose reported to be 2/26 PM.    Home Regimen: Warfarin 2.5mg  Tues, Weds, Thurs,  Sat, Sun                             Warfarin 5mg  Mon, Fri   DATE INR DOSE 2/27 3.5  held 2/28 3.7 held 2/29  3.4 held 3/01 3.2 Held 3/02     2.3       2.5mg  3/3       2.0       2.5mg  3/4       2.7       2.5mg  3/5       3.1  Goal of Therapy:  INR 2-3 Monitor platelets by anticoagulation protocol: Yes   Plan:  Patient slightly supratherapeutic INR, will omit warfarin this evening and consider resuming at a slightly lower dose (possible 2mg )   Will recheck INR daily while on antibiotics per protocol, and CBCs at least every 3 days.   Pharmacy will F/U with AM labs and continue to order warfarin dose based on INRs.   Lu Duffel, PharmD, BCPS Clinical Pharmacist 12/11/2018 7:33 AM

## 2018-12-11 NOTE — Progress Notes (Signed)
OT Cancellation Note  Patient Details Name: [name] MRN: [id number] DOB: 1930-02-10   Cancelled Treatment:    Reason Eval/Treat Not Completed: Other (Comment). OT order cancelled by Palliative Care NP (pt transitioned to comfort care).   Jeni Salles, MPH, MS, OTR/L ascom [phone] 12/11/18, 11:39 AM

## 2018-12-12 DIAGNOSIS — R0682 Tachypnea, not elsewhere classified: Secondary | ICD-10-CM

## 2018-12-12 DIAGNOSIS — R0603 Acute respiratory distress: Secondary | ICD-10-CM

## 2018-12-12 MED ORDER — LORAZEPAM 2 MG/ML PO CONC
0.5000 mg | ORAL | Status: DC | PRN
  Administered 2018-12-12 – 2018-12-13 (×4): 0.5 mg via ORAL
  Filled 2018-12-12 (×5): qty 1

## 2018-12-12 MED ORDER — MORPHINE SULFATE (CONCENTRATE) 10 MG/0.5ML PO SOLN
5.0000 mg | Freq: Four times a day (QID) | ORAL | Status: DC
  Administered 2018-12-12 – 2018-12-13 (×4): 5 mg via SUBLINGUAL
  Filled 2018-12-12 (×4): qty 1

## 2018-12-12 MED ORDER — MORPHINE SULFATE (PF) 2 MG/ML IV SOLN
2.0000 mg | INTRAVENOUS | Status: DC | PRN
  Administered 2018-12-12: 2 mg via SUBCUTANEOUS
  Filled 2018-12-12: qty 1

## 2018-12-12 MED ORDER — ATROPINE SULFATE 1 % OP SOLN
2.0000 [drp] | OPHTHALMIC | Status: DC | PRN
  Administered 2018-12-12: 2 [drp] via SUBLINGUAL
  Filled 2018-12-12: qty 2

## 2018-12-12 MED ORDER — ONDANSETRON 4 MG PO TBDP
4.0000 mg | ORAL_TABLET | Freq: Three times a day (TID) | ORAL | Status: DC | PRN
  Filled 2018-12-12: qty 1

## 2018-12-12 MED ORDER — MORPHINE SULFATE (CONCENTRATE) 10 MG/0.5ML PO SOLN
5.0000 mg | ORAL | Status: DC | PRN
  Administered 2018-12-12 – 2018-12-13 (×3): 10 mg via SUBLINGUAL
  Filled 2018-12-12 (×3): qty 1

## 2018-12-12 NOTE — Progress Notes (Signed)
Nutrition Brief Note  RD drawn to pt due to positive MST score. Chart reviewed. Pt now transitioning to comfort care.  No nutrition interventions warranted at this time.  Please consult as needed.   Gaynell Face, MS, RD, LDN Inpatient Clinical Dietitian Pager: [phone] Weekend/After Hours: [phone]

## 2018-12-12 NOTE — Progress Notes (Signed)
Palliative symptom check. Patient intermittently moaning but appears comfortable with regular, shallow respiratory pattern. Message left with secretary to notify RN to give scheduled 1600 Roxanol dose now per daughters request. Daughters state relief from [place]. Discussed other symptom medications on MAR. Discussed EOL expectations. Chaplain has been to bedside. Emotional/spiritual support provided.   NO CHARGE  Ihor Dow, FNP-C Palliative Medicine Team  Phone: [phone] Fax: [phone]

## 2018-12-12 NOTE — Progress Notes (Signed)
[place] [facility] [place] NAME: [name]    MR#:  [id number]  DATE OF BIRTH:  1930-06-22  SUBJECTIVE:  CHIEF COMPLAINT:   Chief Complaint  Patient presents with  . Shortness of Breath  . Cough   Hospice bed available, per [facility] NP palliative care, patient is not stable for transport. Family agrees.  Daughters at bedside when seen this AM. Patient moans occasionally and seems to respond to RN when asked questions/notified of meds being given. Has otherwise not been awake. Brief agitation last night, none today. Chaplain has seen family yesterday and today.  REVIEW OF SYSTEMS:  ROS Unable to assess, patient nearing end of life. Nonresponsive.   DRUG ALLERGIES:   Allergies  Allergen Reactions  . Raloxifene Other (See Comments)    REACTION: risk of recurrent stroke  . Sulfonamide Derivatives Anaphylaxis    REACTION: tongue swells  . Amoxicillin-Pot Clavulanate Diarrhea    REACTION: diarrhea Has patient had a PCN reaction causing immediate rash, facial/tongue/throat swelling, SOB or lightheadedness with hypotension: unknown Has patient had a PCN reaction causing severe rash involving mucus membranes or skin necrosis: unknown Has patient had a PCN reaction that required hospitalization : unknown Has patient had a PCN reaction occurring within the last 10 years: yes   pt states she should be able to take penicillin, but cant remember actually taking it   . Ciclesonide Other (See Comments)    REACTION: bad smell  . Ciprofloxacin Other (See Comments)    dizziness   VITALS:  Blood pressure (!) 147/57, pulse (!) 116, temperature 98 F (36.7 C), temperature source Oral, resp. rate 20, height 5\' 4"  (1.626 m), weight 59 kg, SpO2 (!) 75 %. PHYSICAL EXAMINATION:  Physical Exam Constitutional: She is ill-appearing, cachectic, somnolent. HENT:  Head:Normocephalicand atraumatic.  Right Ear: External earnormal.  Mouth/Throat:Oropharynx  is clear and moist.  Eyes:Pupils are equal, round, and reactive to light. Neck:Normal range of motion.Neck supple.No tracheal deviationpresent.  Cardiovascular: Positive for edema. Respiratory:There is increased work of breathing with conversation + dyspnea. Breathing is shallow. She is wearing a non-rebreather mask. GI:She exhibitsno distension. There isno abdominal tenderness.  Musculoskeletal:Tender to palpation at left anterior lower extremity.Normal range of motion.  Neurological: She is in and out of sleep and oriented to person, place, and time. Full neuro exam deferred. Skin:Scattered ecchymoses.Skin iswarm. She isnot diaphoretic. Noerythema.  Psychiatric: She is minorly conversant. Drowsy.  LABORATORY PANEL:  Female CBC Recent Labs  Lab 12/11/18 0433  WBC 20.5*  HGB 11.8*  HCT 32.5*  PLT 242   ------------------------------------------------------------------------------------------------------------------ Chemistries  Recent Labs  Lab 12/06/18 0404  12/09/18 0411  12/11/18 0433  NA 118*   < > 126*   < > 131*  K 4.3   < > 4.1   < > 4.7  CL 89*   < > 93*   < > 96*  CO2 23   < > 26   < > 28  GLUCOSE 162*   < > 167*   < > 170*  BUN 18   < > 34*   < > 34*  CREATININE 0.49   < > 0.47   < > 0.61  CALCIUM 8.4*   < > 8.3*   < > 9.0  MG 1.7  --   --   --   --   AST  --   --  30  --   --   ALT  --   --  52*  --   --   ALKPHOS  --   --  69  --   --   BILITOT  --   --  0.7  --   --    < > = values in this interval not displayed.   RADIOLOGY:  No results found. ASSESSMENT AND PLAN:   Tabitha Velarde Schillois a 39 year oldfemalewithhistory ofCVA, PE, hypertension, hyperlipidemia, GERD, coronary artery disease, COPD, depression, who was admitted to [facility] on2/27/2020for Bronchiectasis with acute exacerbation, hyponatremia, and generalized fatigue.  As of 12/11/2018 we are pursuing Hospice with [facility] while awaiting a bed following family  discussions with palliative care and given the patient's declining health.Anticipate a possible hospital death.    Symptom management (per Palliative care recommendations) ? Roxanol 5mg  PO q2h prn pain/dyspnea ? Morphine 1-2mg  IV q2h prn severe pain/severe dyspnea ? Ativan 0.5mg  IV q4h prn anxiety ? Robinul 0.2mg  IV q4h prn secretions RN to transition off NRB back to nasal cannula when daughters ready. Family requested leaving cardiac monitor on to anticipate her passing. Left.  The following is included for documentation purposes:  Plan:  1.Acute respiratory failureacute bronchiectasis with iatrogenic fluid overload. Questioned acute pneumonia vs. Aspiration. Continued nebulizer treatments. Antibiotics.Solumedrol. -CXR portable 12/07/2018 repeat - shows new consolidation vs. Atelectasis, right pleural effusion - CXR 2-view 12/08/2018 -confluent right lung base opacity -Diuresis with lasix per nephrology WBC elevated to20.5today. Patient wason steroids. Shewason doxycycline.Sputumculturegrowing GNR.Procalcitonin less than 0.1.Switched to [employer] recommendations3/12/2018. Morphine PRN to alleviate discomfort and aid breathing. Xanax.  2.Severe hyponatremiawith initial sodium of 112 Likely due tounderlying pulmonary process No seizures. Had some mild confusion which is reported to be improved. Most recent sodium 131. Has not been stable. -Nephrology/Dr. Juleen China following:discontinuedhypertonic saline, serial sodium and neuro checks. Hold sertraline.Discontinued normal saline.  Restarted diuresis with Lasix 12/10/2018. Trial of Tolvaptan. PO intake.  Serum osmolality of 245. Urine sodium of 21. Urine osmolality of 277.  HLK5.625WLS cortisol level 12.0 Comfort measures only.  3. Hypertensive urgencyon presentation  Blood pressure appeared better controlled Continued amlodipine, diltiazem, hydralazine, losartan  4.Sinus  tachycardia due to underlying lung disease  5.Depression, anxietyheld sertralinefor hyponatremia, xanax PRN  6.History of PEcontinued Coumadin INR2.7today.Goal 2-3.Pharmacy followed  7. Deconditioning, multiple comorbiditiesseen by OT and PT. Recommended SNF. LCSW followed. Palliative care following, patient and familyagreed onDNR/DNI, outpatient palliative care at minimum.The decision was made to proceed with comfort measures only and hospice as of 12/11/2018.  DVT prophylaxis;patientwasalready on Coumadin.   All the records are reviewed and case is discussed with Care Management/Social Worker. Management plans discussed with the patient and/or family and they are in agreement.  CODE STATUS: DNR  TOTAL TIME TAKING CARE OF THIS PATIENT: 15 minutes.   More than 50% of the time was spent in counseling/coordination of care: [place].   Tabitha Vellucci PA-C on 12/12/2018 at 4:15 PM  Between 7am to 6pm - Pager - [phone]  After 6 pm go to [email or web address] - Proofreader  [facility]  Office  [phone]  CC: Primary care physician; [name], MD  Note: This dictation was prepared with Dragon dictation along with smaller phrase technology. Any transcriptional errors that result from this process are unintentional.

## 2018-12-12 NOTE — Progress Notes (Signed)
Hospice home referral received from [facility]. Perr conversation with [facility] palliative Medicine NP, patient is too unstable for transport at this time.  Flo Shanks BSN, RN, [facility] Liaison [facility] (formerly [facility], hospital liaison [phone]

## 2018-12-12 NOTE — Progress Notes (Signed)
Pt's daughter called this RN saying that pt was getting anxious and pulling at O2, telemetry also called saying pt's HR elevated anywhere from 140-190's. This RN comes in to assess pt, respirations in the 40's 84% on 6L O2, PO morphine given. Pt's daughter requested to try to put pt on venti mask to see if that would help pt, respiratory therapy called, placed on mask but did not help pt's sats and pt was not comfortable on mask so nasal cannula was placed back on. Pt's HR finally started to come back down to the 110's and started to relax.

## 2018-12-12 NOTE — Progress Notes (Signed)
Patient resting at this time with daughters at bedside.  Patient on comfort care and given morphine and ativan prn as needed for SOB or discomfort.  Daughters agreeable at this time to take heart monitor off.  Dr. Manuella Ghazi made aware.

## 2018-12-12 NOTE — Progress Notes (Addendum)
Daily Progress Note   Patient Name: [name]       Date: 12/12/2018 DOB: 04-01-1930  Age: 85 y.o. MRN#: [id number] Attending Physician: Max Sane, MD Primary Care Physician: [name], MD Admit Date: 11/14/2018  Reason for Consultation/Follow-up: Establishing goals of care  Subjective: Upon arrival to room, patient appears uncomfortable with respiratory distress, tachypnea RR 40, and accessory muscle usage. O2 sats 70's. RN at bedside and Roxanol was recently given.  Discussed plan of care with daughters Cecille Aguirre and [place]. Explained that she appears to unstable for transfer to hospice home and recommendation to keep her inpatient. Daughters agree with this plan. Discussed symptom management regimen. Unfortunately IV leaking and needs to be removed. Discussed trying SQ morphine to help maintain comfort and respiratory distress. Also plan to schedule low dose Roxanol. Daughters agree with this plan.   Emotional/spiritual support provided. Daughters have PMT contact information and pager # provided.   1761: F/u symptom check. Patient appears comfortable with regular, shallow respiratory pattern. RR 25.   Length of Stay: 8  Current Medications: Scheduled Meds:  . morphine CONCENTRATE  5 mg Sublingual Q6H  . sodium chloride flush  10 mL Intravenous Q12H    Continuous Infusions: . sodium chloride 500 mL (12/08/18 2337)    PRN Meds: sodium chloride, acetaminophen **OR** acetaminophen, bisacodyl, glycopyrrolate, hydrALAZINE, ipratropium-albuterol, LORazepam, LORazepam, morphine injection, morphine injection, morphine CONCENTRATE, ondansetron **OR** ondansetron (ZOFRAN) IV, ondansetron, promethazine  Physical Exam Vitals signs and nursing note reviewed.  Constitutional:    Appearance: She is cachectic. She is ill-appearing.  HENT:     Head: Normocephalic and atraumatic.  Cardiovascular:     Rate and Rhythm: Tachycardia present.  Pulmonary:     Effort: Tachypnea, accessory muscle usage and respiratory distress present.     Breath sounds: Decreased breath sounds present.     Comments: RN to give SQ Morphine. Recently give Roxanol.  F/u 6073: patient appears comfortable with RR 25. No accessory muscle usage.  Abdominal:     Tenderness: There is abdominal tenderness.  Skin:    General: Skin is warm and dry.     Coloration: Skin is pale.  Neurological:     Mental Status: She is easily aroused.     Comments: Drowsy, somnolent   Psychiatric:  Attention and Perception: She is inattentive.        Speech: Speech is delayed.            Vital Signs: BP (!) 147/57 (BP Location: Left Arm)   Pulse (!) 116   Temp 98 F (36.7 C) (Oral)   Resp 20   Ht _0  (1.626 m)   Wt 59 kg   SpO2 (!) 75%   BMI 22.31 kg/m  SpO2: SpO2: (!) 75 % O2 Device: O2 Device: Nasal Cannula O2 Flow Rate: O2 Flow Rate (L/min): 6 L/min  Intake/output summary:   Intake/Output Summary (Last 24 hours) at 12/12/2018 1147 Last data filed at 12/12/2018 8889 Gross per 24 hour  Intake 243 ml  Output 100 ml  Net 143 ml   LBM: Last BM Date: (unknown-family states a week) Baseline Weight: Weight: 58.7 kg Most recent weight: Weight: (pt. complained back pain, could mot lie flat for weight)       Palliative Assessment/Data: PPS 10%    Flowsheet Rows     Most Recent Value  Intake Tab  Referral Department  Hospitalist  Unit at Time of Referral  Cardiac/Telemetry Unit  Palliative Care Primary Diagnosis  Pulmonary  Date Notified  12/09/18  Palliative Care Type  New Palliative care  Reason for referral  Clarify Goals of Care  Date of Admission  12/05/2018  Date first seen by Palliative Care  12/09/18  # of days Palliative referral response time  0 Day(s)  # of days IP prior to  Palliative referral  5  Clinical Assessment  Palliative Performance Scale Score  10%  Psychosocial & Spiritual Assessment  Palliative Care Outcomes  Patient/Family meeting held?  Yes  Who was at the meeting?  daughters  Palliative Care Outcomes  Clarified goals of care, Counseled regarding hospice, Provided end of life care assistance, Improved pain interventions, Improved non-pain symptom therapy, Provided psychosocial or spiritual support, Changed to focus on comfort      Patient Active Problem List   Diagnosis Date Noted  . Palliative care by specialist   . DNR (do not resuscitate)   . SOB (shortness of breath) 11/11/2018  . Pneumonia of right middle lobe due to infectious organism ([place]) 09/08/2018  . Compression fracture of T12 vertebra with routine healing 09/01/2018  . Chronic obstructive pulmonary disease ([place]) 09/01/2018  . Supratherapeutic INR 09/01/2018  . Gait disorder 09/01/2018  . Peripheral edema 09/01/2018  . CAP (community acquired pneumonia) 08/26/2018  . Hyponatremia 08/26/2018  . History of vertebral compression fracture 08/26/2018  . Compression fracture of L3 vertebra (HCC) 08/20/2018  . Acute bilateral thoracic back pain 05/26/2018  . Cough 11/20/2017  . Abdominal pain 10/15/2017  . Wheezing 08/06/2017  . General weakness 08/02/2016  . Goals of care, counseling/discussion 12/16/2013  . Acute upper respiratory infection 12/16/2013  . Nonspecific abnormal finding in stool contents 10/19/2013  . Benign neoplasm of colon 10/19/2013  . Urinary frequency 08/28/2013  . Hematochezia 05/26/2013  . Chest pain 11/10/2012  . Vertigo 05/28/2012  . Trapezius strain 01/10/2012  . Chronic respiratory failure with hypoxia () 12/21/2011  . Bronchiectasis ([place]) 12/11/2011  . Fatigue 11/29/2011  . Preventative health care 09/22/2011  . Impaired glucose tolerance 09/21/2011  . Internal hemorrhoids without complication 16/94/5038  . Palpitations 05/21/2011  . Long  term (current) use of anticoagulants 01/03/2011  . History of pulmonary embolism 12/08/2010  . FLATULENCE-GAS-BLOATING 07/03/2010  . ABNORMAL FINDINGS GI TRACT 07/03/2010  . WEIGHT LOSS-ABNORMAL 06/06/2010  .  NAUSEA 06/06/2010  . ABDOMINAL PAIN-PERIUMBILICAL 27/78/2423  . ABDOMINAL PAIN-EPIGASTRIC 06/06/2010  . ABNORMAL EXAM-BILIARY TRACT 06/06/2010  . DIZZINESS, CHRONIC 10/14/2009  . OVERACTIVE BLADDER 05/26/2009  . CORONARY ARTERY DISEASE 04/06/2009  . COLONIC POLYPS, HX OF 04/06/2009  . Chronic rhinitis 11/22/2008  . Dysuria 05/27/2008  . HEMATOCHEZIA 05/07/2008  . PULMONARY HYPERTENSION, SECONDARY 01/15/2008  . Dyspnea 12/30/2007  . GAIT IMBALANCE 11/26/2007  . Unspecified hearing loss 10/17/2007  . Hyperlipidemia 08/05/2007  . Anxiety state 08/05/2007  . Depression 08/05/2007  . COMMON MIGRAINE 08/05/2007  . CAROTID ARTERY STENOSIS, RIGHT 08/05/2007  . Allergic rhinitis 08/05/2007  . DIVERTICULOSIS, COLON 08/05/2007  . CYST/PSEUDOCYST, PANCREAS 08/05/2007  . OSTEOARTHRITIS, KNEE, RIGHT 08/05/2007  . Personal History of Other Diseases of Digestive Disease 08/05/2007  . Essential hypertension 03/11/2007  . GERD 03/11/2007  . OSTEOPOROSIS 03/11/2007  . TRANSIENT ISCHEMIC ATTACK, HX OF 03/11/2007    Palliative Care Assessment & Plan   Patient Profile: 85 y.o. 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 Hospital admission for bronchiectasis exacerbation, acute respiratory failure, and severe hyponatremia. Nephrology following. Palliative medicine consultation for goals of care.   Assessment: Acute respiratory failure Acute bronchiectasis exacerbation Hx of suspected MAC infection RML Pulmonary hypertension Severe  hyponatremia Hypertensive urgency Depression Anxiety Dyspnea  Recommendations/Plan:  MOST form completed with patient and daughters 12/10/18. Patient wishes include DNR/DNI, comfort focused care if further decline, IVF/ABX for time trial, and NO feeding tube. Durable DNR completed. Copies made for chart and daughters.   Clinical decline 3/5 AM. Daughters wish to transition to comfort measures only and pursue hospice facility if appropriate. Daughters understand interventions not aimed at comfort will be discontinued.   Symptom management  Roxanol 5-67m PO q2h prn pain/dyspnea  Scheduled Roxanol 564mSL q6h  IV access lost. RN may give Morphine 75m29mQ q2h prn dyspnea/pain  Ativan 0.5mg84m q4h prn anxiety  Atropine gtts prn secretions  Daughters request to continue cardiac monitor. RN notified.   Comfort feeds per patient/family request.  Unstable for transfer to hospice facility. Recommend continued comfort measures inpatient. Anticipate hospital death.   Code Status: DNR/DNI   Code Status Orders  (From admission, onward)         Start     Ordered   11/23/2018 1723  Full code  Continuous     11/23/2018 1722        Code Status History    Date Active Date Inactive Code Status Order ID Comments User Context   08/26/2018 2202 08/28/2018 1651 Full Code [id number] Bulls ED    Advance Directive Documentation     Most Recent Value  Type of Advance Directive  Healthcare Power of Attorney  Pre-existing out of facility DNR order (yellow form or pink MOST form)  -  "MOST" Form in Place?  -       Prognosis:  Poor prognosis with acute on chronic respiratory failure 2/2 COPD, bronchiectasis exacerbation, hx of suspected RML MAC infection, pulmonary hypertension, declining functional/nutritional status.   Discharge Planning:  Anticipated Hospital Death  Care plan was discussed with daughters (SanLovey Aguirre LoriCecille RubinN, Dr. ShahManuella Ghazi  Thank you for allowing the  Palliative Medicine Team to assist in the care of this patient.   Time In: 0800- 09- 4008me  Out: 0830 1000 Total Time 35 Prolonged Time Billed  no      Greater than 50%  of this time was spent counseling and coordinating care related to the above assessment and plan.  Ihor Dow, FNP-C Palliative Medicine Team  Phone: [phone] Fax: [phone]  Please contact Palliative Medicine Team phone at [phone] for questions and concerns.

## 2018-12-22 ENCOUNTER — Ambulatory Visit: Payer: Medicare Other

## 2018-12-30 ENCOUNTER — Ambulatory Visit: Payer: Medicare Other | Admitting: Internal Medicine

## 2019-01-07 NOTE — Progress Notes (Signed)
Patient's family was present at bedside when she passed.  They called the RN, who then checked and had a second RN check the patient.  Time of death recorded as 1:15pm.  Family wants the patient to go to [place] to be buried.  AC said the local funeral home could help them arrange it.  Phillis Knack, RN

## 2019-01-07 NOTE — Death Summary Note (Signed)
   [place] at [facility]    Death Note [name] summary  Chief complaint; shortness of breath and cough  HISTORY OF PRESENT ILLNESS: [name]  is a 85 y.o. female with a known history of bronchiectasis, history of pulmonary embolism, pulmonary hypertension, COPD, carotid artery disease, hypertension, hyperlipidemia, depression who uses oxygen only with activity and exertion presenting with shortness of breath.  Patient started not feeling well since Monday was seen by pulmonary who told her to start doxycycline if symptoms got worse.  Patient started not feeling well on Tuesday started the doxycycline.  Earlier this morning patient had a glazed look on her face.  And her breathing got worse therefore daughter brought to the emergency room.  Patient's chest x-ray shows possible worsening interstitial lung disease there is concern for bronchiectasis.  Hospital course;   1.Acute hypoxic respiratory failureacute bronchiectasis with iatrogenic fluid overload. Questionedacute pneumonia vs. Aspiration. Continuednebulizer treatments. Antibiotics.Solumedrol. -CXR portable 12/07/2018 repeat - shows new consolidation vs. Atelectasis, right pleural effusion - CXR 2-view 12/08/2018 -confluent right lung base opacity . WBC was elevated to20.5 recently. Patient wason steroids.Shewason doxycycline.Sputumculturegrowing GNR.Procalcitonin less than 0.1.Switched to [employer] recommendations3/12/2018. Family has decided on keeping patient comfortable going forward.  Patient currently on comfort care measures only.  Appears to be having agonal breathing.  Not stable enough for transportation to hospice home.    Decision was to keep patient comfortable in the hospital since patient was not stable enough for transportation.  Patient subsequently died on Jan 01, 2019 and was pronounced dead at 1:15 PM.  Patient's 2 daughters present at bedside at the time of  death.  2.Severe hyponatremiawith initial sodium of 112 Likely due tounderlying pulmonary process No seizures. Had some mild confusion which is reported to be improved. Most recent sodium 131. Nephrology/Dr. Juleen China following during this admission:discontinuedhypertonic saline, serial sodium and neuro checks. Held sertraline.Discontinued normal saline.  Restarted diuresis with Lasix 12/10/2018.Trial of Tolvaptan previously.Serum osmolality of 245. Urine sodium of 21. Urine osmolality of 277. [id number] cortisol level 12.0.  Decision was to keep patient comfortable going forward.  Patient subsequently died on 01-01-19  3. Hypertensive urgencyon presentation  Blood pressure appearedbetter controlled recently.  Patient however was placed on comfort care measures only prior to death .  4.Sinus tachycardia due to underlying lung disease  5.Depression, anxietyheld sertralinefor hyponatremia, .  Patient was on comfort care measures  6.History of PE Coumadin previously discontinued.  Patient was on comfort care measures  7. Deconditioning, multiple comorbidities Family already decided on comfort care measures and patient was comfortable prior to death    Pronounced dead by 2 registered nurses; [name] on      @   1:15 PM                 Cause of death ; acute hypoxic respiratory failure  [name] M.D on 2019-01-01 at 2:43 PM  [facility] [place]  Total clinical and documentation time for today greater than 30 minutes

## 2019-01-07 NOTE — Progress Notes (Signed)
[facility] NAME: [name]    MR#:  [id number]  DATE OF BIRTH:  1929/10/27  SUBJECTIVE:  CHIEF COMPLAINT:   Chief Complaint  Patient presents with  . Shortness of Breath  . Cough   No new complaint this morning.  Patient currently resting comfortably.  Appears to have agonal breathing.  2 daughters at bedside were updated on treatment plans.  Patient currently on comfort care measures only. Patient deemed not to be stable enough for transport to hospice home.  REVIEW OF SYSTEMS:  ROS  Unobtainable due to medical condition.  DRUG ALLERGIES:   Allergies  Allergen Reactions  . Raloxifene Other (See Comments)    REACTION: risk of recurrent stroke  . Sulfonamide Derivatives Anaphylaxis    REACTION: tongue swells  . Amoxicillin-Pot Clavulanate Diarrhea    REACTION: diarrhea Has patient had a PCN reaction causing immediate rash, facial/tongue/throat swelling, SOB or lightheadedness with hypotension: unknown Has patient had a PCN reaction causing severe rash involving mucus membranes or skin necrosis: unknown Has patient had a PCN reaction that required hospitalization : unknown Has patient had a PCN reaction occurring within the last 10 years: yes   pt states she should be able to take penicillin, but cant remember actually taking it   . Ciclesonide Other (See Comments)    REACTION: bad smell  . Ciprofloxacin Other (See Comments)    dizziness   VITALS:  Blood pressure (!) 147/57, pulse (!) 116, temperature 98 F (36.7 C), temperature source Oral, resp. rate 20, height 5\' 4"  (1.626 m), weight 59 kg, SpO2 (!) 75 %. PHYSICAL EXAMINATION:  Physical Exam  Constitutional: She is ill-appearing, cachectic, somnolent. HENT:  Head:Normocephalicand atraumatic.  Right Ear: External earnormal.  Mouth/Throat:Oropharynx is clear and moist.  Eyes:Pupils are equal, round, and reactive to light. Neck:Normal range of  motion.Neck supple.No tracheal deviationpresent.  Cardiovascular: Positive for edema. Respiratory: Patient appears comfortable but appears to have agonal breathing.  On comfort care measures at this time. GI:She exhibitsno distension. There isno abdominal tenderness.  Musculoskeletal:Patient resting comfortably.  Appears sedated from effect of comfort care meds  Neurological:Full neuro exam deferred.  Appears to have agonal respirations Skin:Scattered ecchymoses.Skin iswarm. She isnot diaphoretic. Noerythema.  Psychiatric: Deferred. Drowsy. LABORATORY PANEL:  Female CBC Recent Labs  Lab 12/11/18 0433  WBC 20.5*  HGB 11.8*  HCT 32.5*  PLT 242   ------------------------------------------------------------------------------------------------------------------ Chemistries  Recent Labs  Lab 12/09/18 0411  12/11/18 0433  NA 126*   < > 131*  K 4.1   < > 4.7  CL 93*   < > 96*  CO2 26   < > 28  GLUCOSE 167*   < > 170*  BUN 34*   < > 34*  CREATININE 0.47   < > 0.61  CALCIUM 8.3*   < > 9.0  AST 30  --   --   ALT 52*  --   --   ALKPHOS 69  --   --   BILITOT 0.7  --   --    < > = values in this interval not displayed.   RADIOLOGY:  No results found. ASSESSMENT AND PLAN:   Tabitha Stith Schillois a 26 year oldfemalewithhistory ofCVA, PE, hypertension, hyperlipidemia, GERD, coronary artery disease, COPD, depression, who was [facility]2020for Bronchiectasis with acute exacerbation, hyponatremia, and generalized fatigue.   1.Acute respiratory failureacute bronchiectasis with iatrogenic fluid overload. Questionedacute pneumonia vs. Aspiration. Continuednebulizer treatments. Antibiotics.Solumedrol. -  CXR portable 12/07/2018 repeat - shows new consolidation vs. Atelectasis, right pleural effusion - CXR 2-view 12/08/2018 -confluent right lung base opacity -Diuresis with lasix per nephrology WBC was elevated to20.5 recently. Patient wason  steroids. Shewason doxycycline.Sputumculturegrowing GNR.Procalcitonin less than 0.1.Switched to [employer] recommendations3/12/2018. Family has decided on keeping patient comfortable going forward.  Patient currently on comfort care measures only.  Appears to be having agonal breathing.  Not stable enough for transportation to hospice home.  Will reassess in a.m. if patient still alive.  2.Severe hyponatremiawith initial sodium of 112 Likely due tounderlying pulmonary process No seizures. Had some mild confusion which is reported to be improved. Most recent sodium 131.  -Nephrology/Dr. Kolluru following:discontinuedhypertonic saline, serial sodium and neuro checks. Hold sertraline.Discontinued normal saline.  Restarted diuresis with Lasix 12/10/2018. Trial of Tolvaptan previously.Serum osmolality of 245. Urine sodium of 21. Urine osmolality of 277.  [id number] cortisol level 12.0 Comfort measures only going forward.  3. Hypertensive urgencyon presentation  Blood pressure appearedbetter controlled recently.  Patient however currently on comfort care measures only  4.Sinus tachycardia due to underlying lung disease  5.Depression, anxietyheld sertralinefor hyponatremia, .  Patient on comfort care measures  6.History of PE Coumadin previously discontinued.  Patient currently on comfort care measures  7. Deconditioning, multiple comorbidities Family already decided on comfort care measures only going forward  DVT prophylaxis; Coumadin previously discontinued since patient placed on comfort care measures only.     All the records are reviewed and case discussed with Care Management/Social Worker. Management plans discussed with the patient, family and they are in agreement.  CODE STATUS: DNR  TOTAL TIME TAKING CARE OF THIS PATIENT: 28 minutes.   More than 50% of the time was spent in counseling/coordination of care:  YES  POSSIBLE D/C IN 1-2 DAYS, DEPENDING ON CLINICAL CONDITION.   Jackelynn Hosie M.D on 01/03/2019 at 1:10 PM  Between 7am to 6pm - Pager - [phone]  After 6pm go to [email or web address] - Proofreader  [facility]  Office  [phone]  CC: Primary care physician; [name], MD  Note: This dictation was prepared with Dragon dictation along with smaller phrase technology. Any transcriptional errors that result from this process are unintentional.

## 2019-01-07 DEATH — deceased

## 2019-11-27 IMAGING — RF DG LUMBAR SPINE 2-3V
1 series · 1 of 1 positions shown · non-contrast
Comparison: Lumbar MRI 08/06/2018

CLINICAL DATA: Kyphoplasty L3

EXAM:
DG C-ARM 61-120 MIN; LUMBAR SPINE - 2-3 VIEW

[Series 1: run · 1 of 1 slices shown]
[im 1/1]
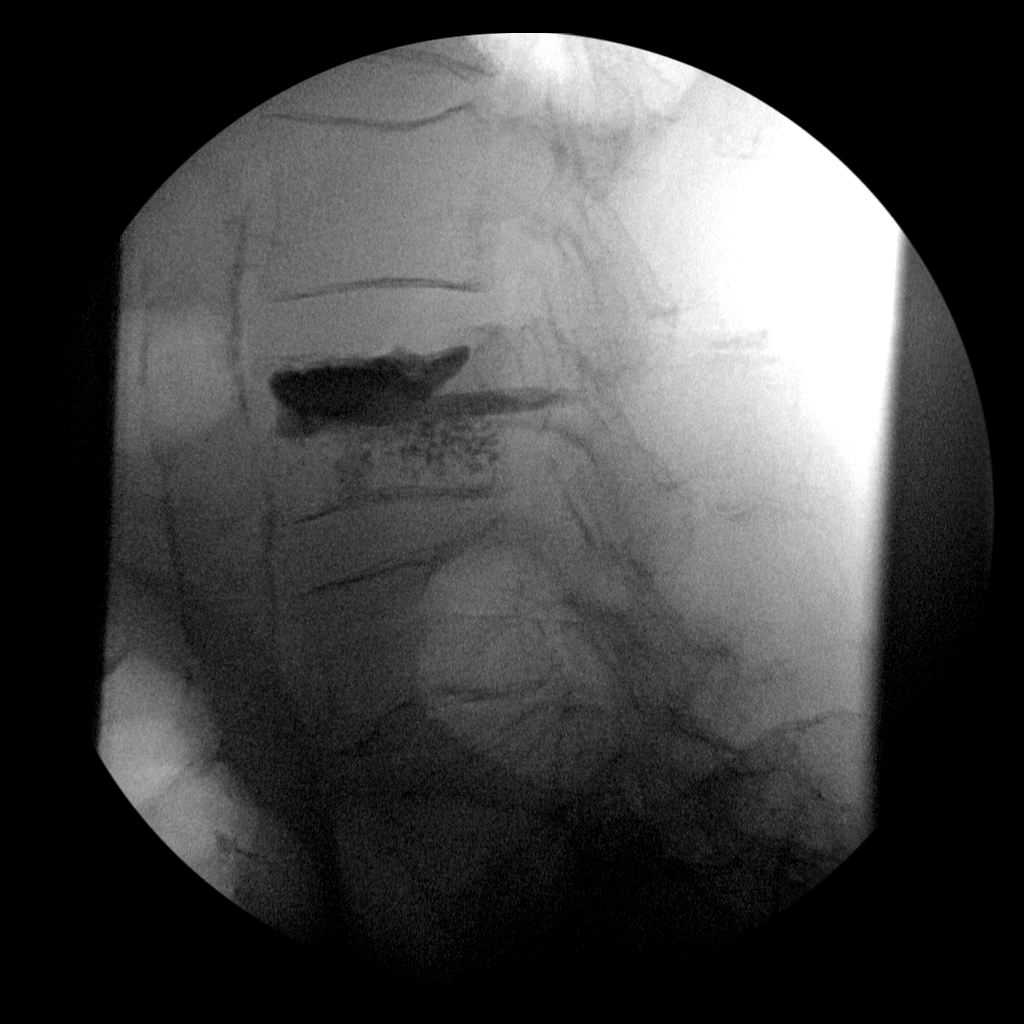

[1 of 1 positions shown; findings below may reference images not displayed]

FINDINGS: AP and lateral C-arm images were obtained. Kyphoplasty of L3. Cement
is present in the superior vertebral body bilaterally with good
distribution.
IMPRESSION: Kyphoplasty for L3 superior endplate fracture.

## 2019-12-03 IMAGING — CT CT ANGIO CHEST
2 of 6 series · 18 of 46 positions shown · IV contrast (ISOVUE)
Comparison: 01/16/2017

CLINICAL DATA: High pretest probability for pulmonary embolism.

EXAM:
CT ANGIOGRAPHY CHEST WITH CONTRAST
TECHNIQUE: Multidetector CT imaging of the chest was performed using the
standard protocol during bolus administration of intravenous
contrast. Multiplanar CT image reconstructions and MIPs were
obtained to evaluate the vascular anatomy.
CONTRAST:  100mL [id number] IOPAMIDOL ([id number]) INJECTION 76%

[Series 5: thins · axial · 0.64mm/px · z∈[+1554,+1832]mm · 15 of 306 slices shown]
[im 14/306  lung]
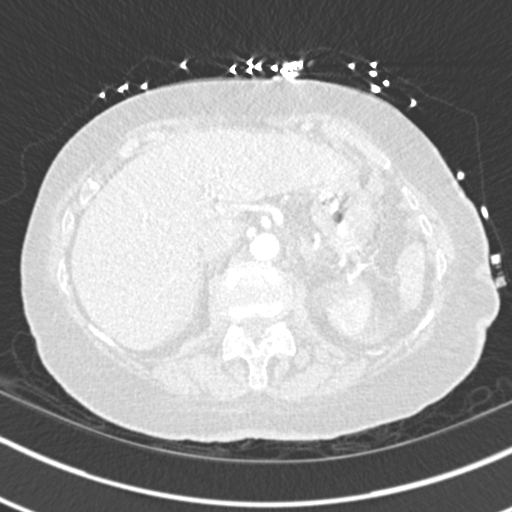
[im 40/306  soft-tissue]
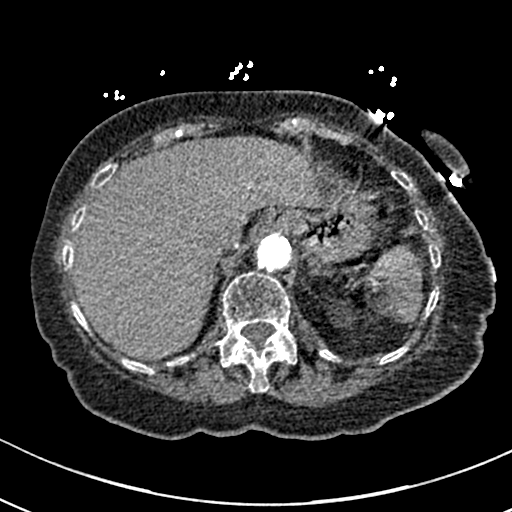
[im 54/306  lung]
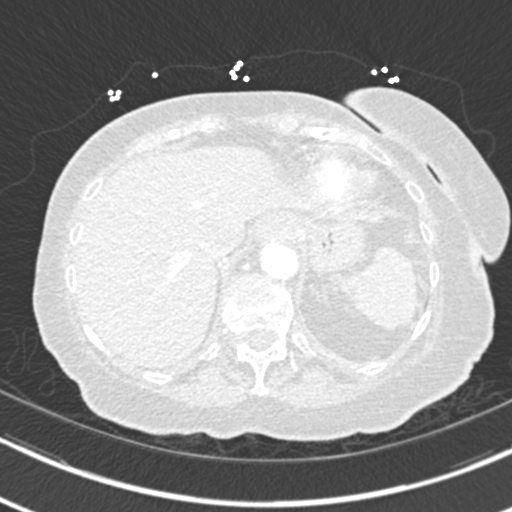
[im 80/306  soft-tissue]
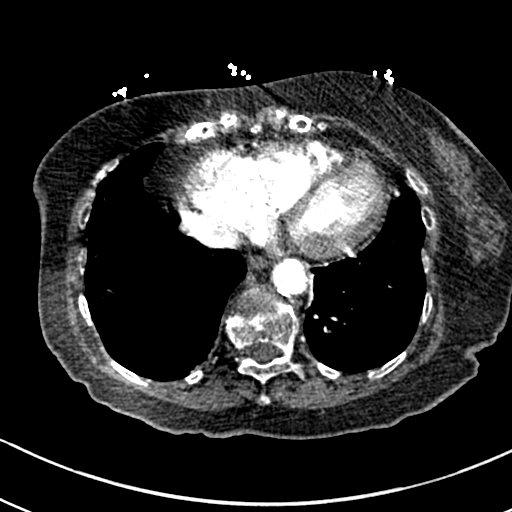
[im 93/306  lung]
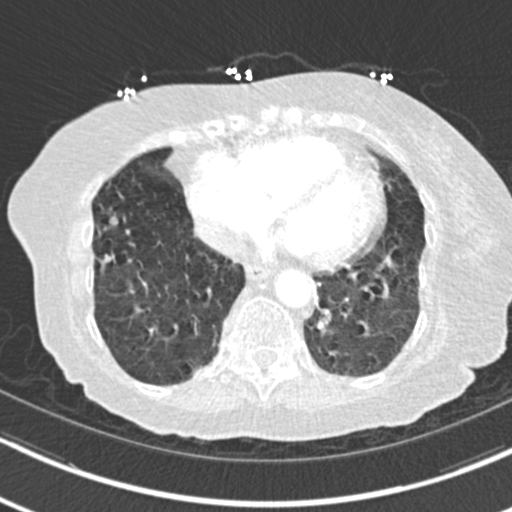
[im 120/306  soft-tissue]
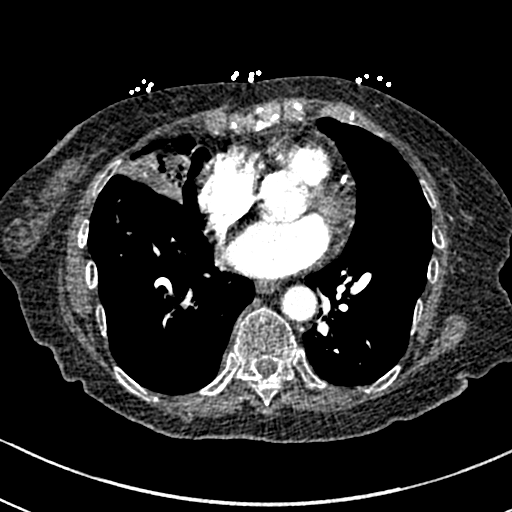
[im 133/306  lung]
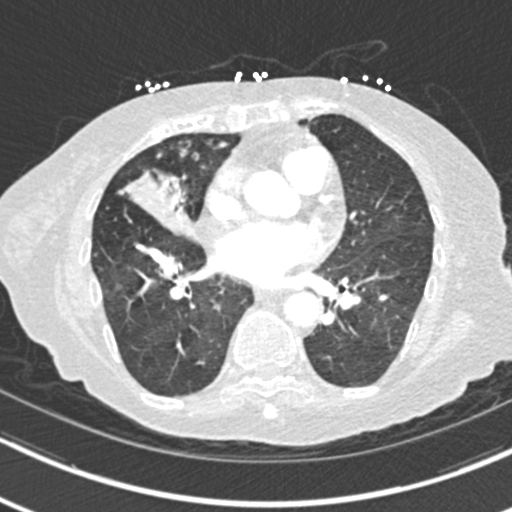
[im 160/306  soft-tissue]
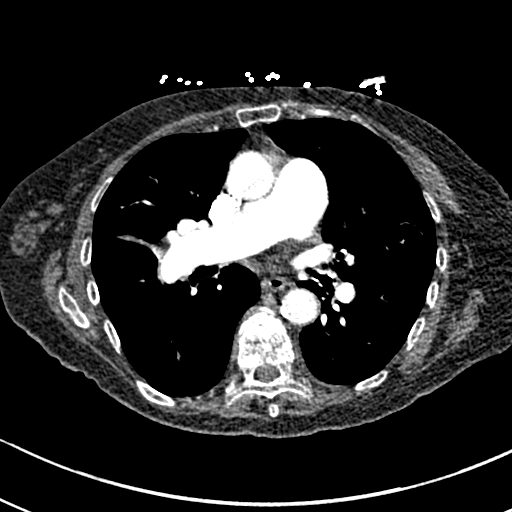
[im 173/306  lung]
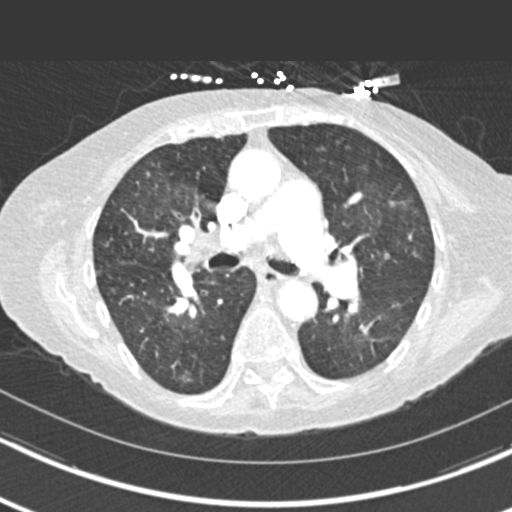
[im 186/306  soft-tissue]
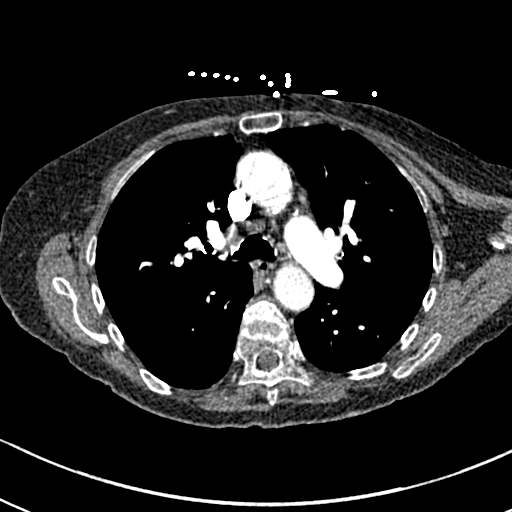
[im 213/306  lung]
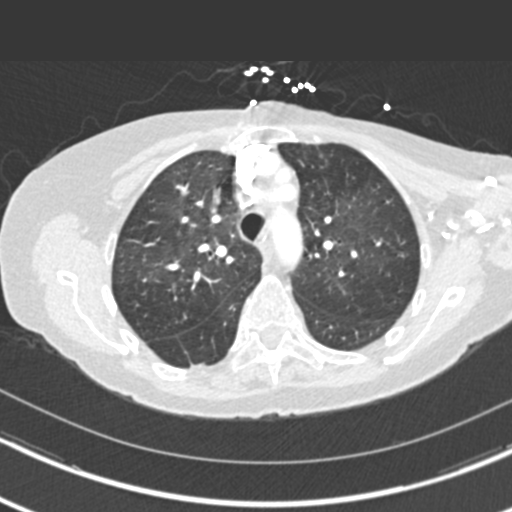
[im 226/306  soft-tissue]
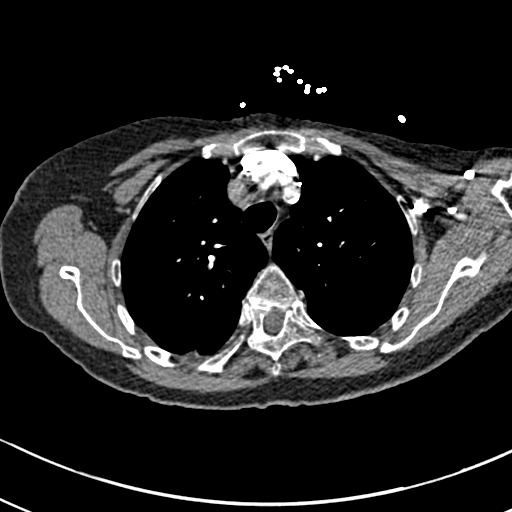
[im 252/306  lung]
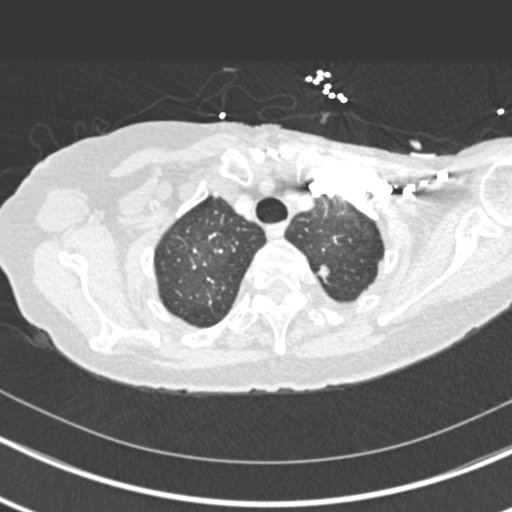
[im 266/306  soft-tissue]
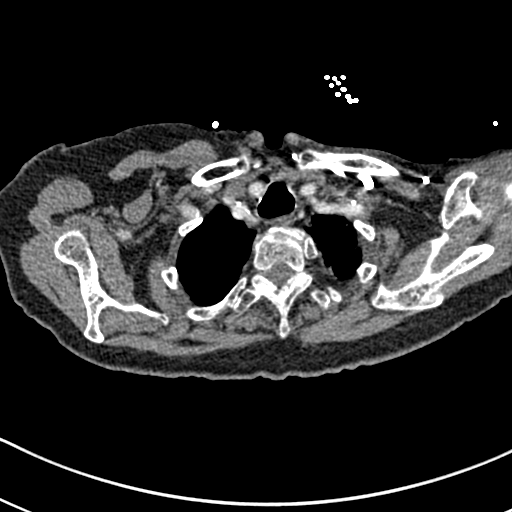
[im 292/306  lung]
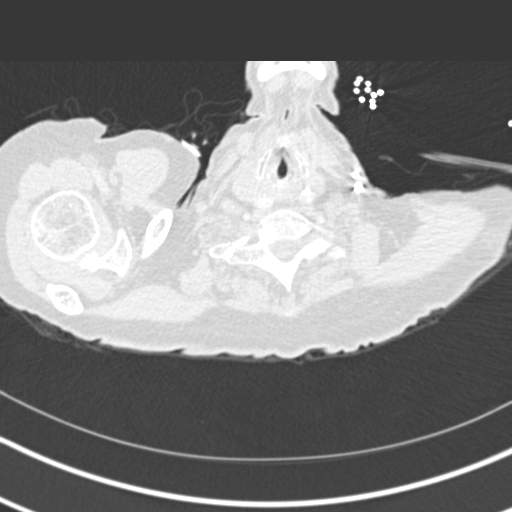

[Series 7: coronal mpr · coronal · 0.58mm/px · 3 of 121 slices shown]
[im 31/121  soft-tissue]
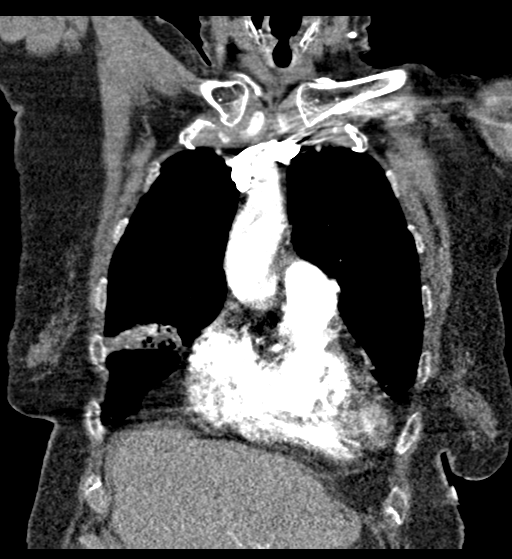
[im 61/121  soft-tissue]
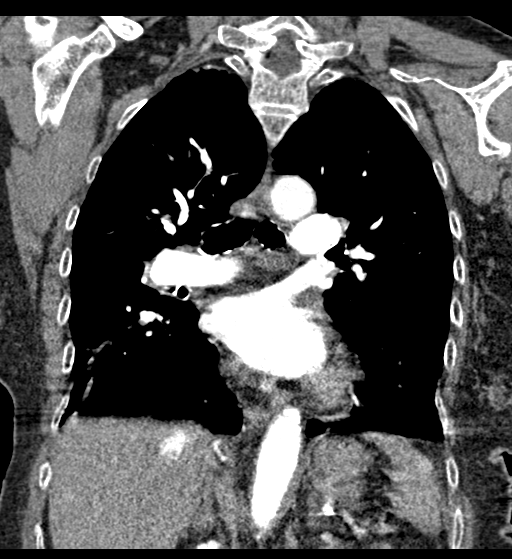
[im 91/121  soft-tissue]
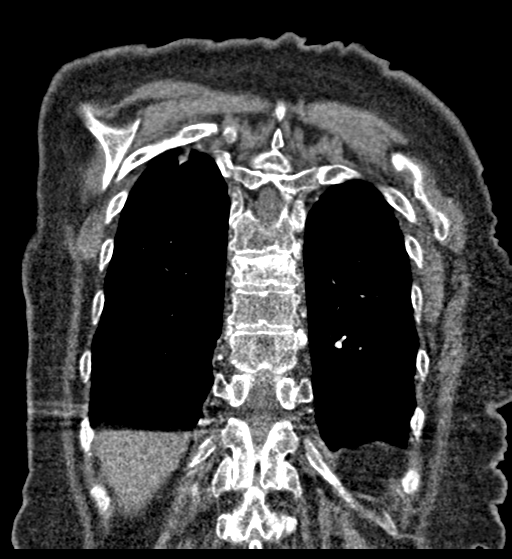

[18 of 46 positions shown; findings below may reference images not displayed]

FINDINGS: Cardiovascular: Normal heart size. No pericardial effusion. No acute
pulmonary embolism. Truncated appearance of multiple peripheral
pulmonary arteries, likely accentuated by motion. This finding is
best demonstrated in the lingula, as demarcated on series 5. Patient
has history of pulmonary embolism in 1668 and this is presumably
related sequela. Main pulmonary artery is enlarged to 3.5 cm,
suggesting pulmonary hypertension. Aortic and coronary
atherosclerosis

Mediastinum/Nodes: Negative for adenopathy or mass.

Lungs/Pleura: The bronchiectasis in the right middle lobe with
collapse. Bronchiectasis with mucoid impaction within the anterior
basal segment right lower lobe and anterior segment right upper
lobe, chronic. Clustered micro nodularity in the posterior right
lower lobe, new.

Upper Abdomen: Negative

Musculoskeletal: Nonacute T8 and T12 compression fractures. T8
height loss is advanced and T12 height loss is mild. Both have
occurred since January 2017. The T8 fracture appears chronic and is
sclerotic. At T12, fracture lucencies are still visible.

Review of the MIP images confirms the above findings.
IMPRESSION: 1. Negative for acute pulmonary embolism.
2. Chronic truncation of multiple peripheral pulmonary arteries
likely related to patient's remote history of pulmonary embolism.
Large main pulmonary artery suggesting associated hypertension.
3. Right-sided bronchiectasis and mucoid impaction with right middle
lobe collapse. No progression since 1357. There may be underlying
chronic SALLYS infection.
4. Minimal infectious or inflammatory opacity in the right lower
lobe likely related to #3, please correlate for active infectious
symptoms.
5. Nonacute compression fractures of T8 and T12 that have occurred
since 1357. The T12 fracture is likely subacute.

## 2020-03-15 IMAGING — DX DG CHEST 1V PORT
1 series · 1 of 1 positions shown · non-contrast
Comparison: 12/04/2018 chest radiograph and prior studies

CLINICAL DATA: Acute shortness of breath

EXAM:
PORTABLE CHEST 1 VIEW

[chest ap]
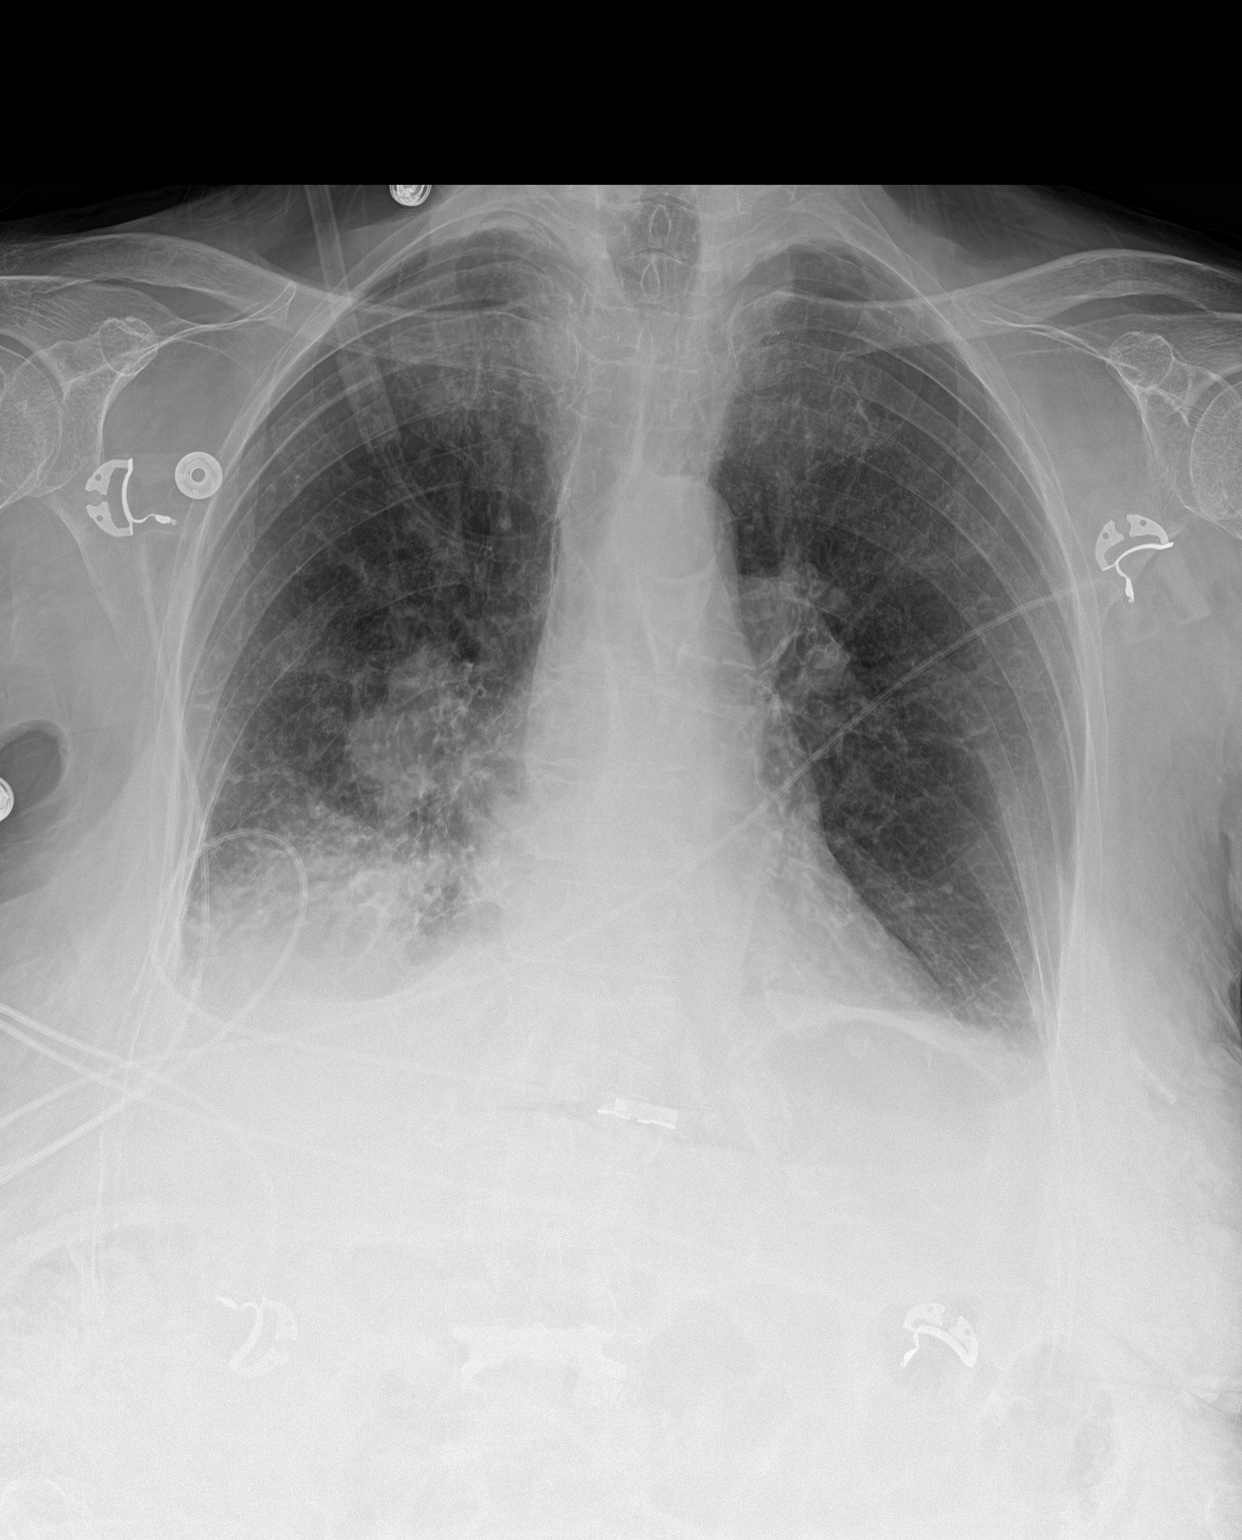

[1 of 1 positions shown; findings below may reference images not displayed]

FINDINGS: New RIGHT LOWER lung consolidation versus atelectasis noted.

RIGHT perihilar opacity is unchanged.

Cardiomediastinal silhouette is otherwise unremarkable.

A trace RIGHT pleural effusion is present.

No pneumothorax or acute bony abnormality.
IMPRESSION: New RIGHT LOWER lung consolidation versus atelectasis and trace
RIGHT pleural effusion.

No other significant changes.
